# Patient Record
Sex: Male | Born: 1950 | Race: Black or African American | Hispanic: No | Marital: Single | Smoking: Former smoker
Health system: Southern US, Community
[De-identification: ages and names within clinical notes are randomized; demographics above are authoritative.]

## PROBLEM LIST (undated history)

## (undated) DIAGNOSIS — K219 Gastro-esophageal reflux disease without esophagitis: Secondary | ICD-10-CM

## (undated) DIAGNOSIS — D696 Thrombocytopenia, unspecified: Secondary | ICD-10-CM

## (undated) DIAGNOSIS — D649 Anemia, unspecified: Secondary | ICD-10-CM

## (undated) DIAGNOSIS — F1020 Alcohol dependence, uncomplicated: Secondary | ICD-10-CM

## (undated) DIAGNOSIS — R079 Chest pain, unspecified: Secondary | ICD-10-CM

## (undated) DIAGNOSIS — D126 Benign neoplasm of colon, unspecified: Secondary | ICD-10-CM

## (undated) DIAGNOSIS — D891 Cryoglobulinemia: Secondary | ICD-10-CM

## (undated) DIAGNOSIS — I2510 Atherosclerotic heart disease of native coronary artery without angina pectoris: Secondary | ICD-10-CM

## (undated) DIAGNOSIS — R37 Sexual dysfunction, unspecified: Secondary | ICD-10-CM

## (undated) DIAGNOSIS — K7460 Unspecified cirrhosis of liver: Secondary | ICD-10-CM

## (undated) DIAGNOSIS — K2270 Barrett's esophagus without dysplasia: Principal | ICD-10-CM

## (undated) DIAGNOSIS — F1910 Other psychoactive substance abuse, uncomplicated: Secondary | ICD-10-CM

## (undated) DIAGNOSIS — G8929 Other chronic pain: Secondary | ICD-10-CM

## (undated) DIAGNOSIS — E785 Hyperlipidemia, unspecified: Secondary | ICD-10-CM

## (undated) DIAGNOSIS — G621 Alcoholic polyneuropathy: Secondary | ICD-10-CM

## (undated) DIAGNOSIS — I10 Essential (primary) hypertension: Secondary | ICD-10-CM

## (undated) DIAGNOSIS — T7840XA Allergy, unspecified, initial encounter: Secondary | ICD-10-CM

## (undated) DIAGNOSIS — C154 Malignant neoplasm of middle third of esophagus: Secondary | ICD-10-CM

## (undated) DIAGNOSIS — I219 Acute myocardial infarction, unspecified: Secondary | ICD-10-CM

## (undated) DIAGNOSIS — M1990 Unspecified osteoarthritis, unspecified site: Secondary | ICD-10-CM

## (undated) DIAGNOSIS — B182 Chronic viral hepatitis C: Secondary | ICD-10-CM

## (undated) HISTORY — PX: UPPER GASTROINTESTINAL ENDOSCOPY: SHX188

## (undated) HISTORY — DX: Malignant neoplasm of middle third of esophagus: C15.4

## (undated) HISTORY — DX: Atherosclerotic heart disease of native coronary artery without angina pectoris: I25.10

## (undated) HISTORY — PX: CORONARY ANGIOPLASTY: SHX604

## (undated) HISTORY — DX: Other psychoactive substance abuse, uncomplicated: F19.10

## (undated) HISTORY — DX: Chest pain, unspecified: R07.9

## (undated) HISTORY — DX: Unspecified osteoarthritis, unspecified site: M19.90

## (undated) HISTORY — DX: Hyperlipidemia, unspecified: E78.5

## (undated) HISTORY — DX: Chronic viral hepatitis C: B18.2

## (undated) HISTORY — DX: Other chronic pain: G89.29

## (undated) HISTORY — DX: Acute myocardial infarction, unspecified: I21.9

## (undated) HISTORY — DX: Essential (primary) hypertension: I10

## (undated) HISTORY — DX: Barrett's esophagus without dysplasia: K22.70

## (undated) HISTORY — DX: Anemia, unspecified: D64.9

## (undated) HISTORY — DX: Gastro-esophageal reflux disease without esophagitis: K21.9

## (undated) HISTORY — DX: Benign neoplasm of colon, unspecified: D12.6

## (undated) HISTORY — DX: Cryoglobulinemia: D89.1

## (undated) HISTORY — PX: HERNIA REPAIR: SHX51

## (undated) HISTORY — DX: Alcohol dependence, uncomplicated: F10.20

## (undated) HISTORY — DX: Sexual dysfunction, unspecified: R37

## (undated) HISTORY — DX: Unspecified cirrhosis of liver: K74.60

## (undated) HISTORY — DX: Allergy, unspecified, initial encounter: T78.40XA

## (undated) HISTORY — DX: Alcoholic polyneuropathy: G62.1

## (undated) HISTORY — DX: Thrombocytopenia, unspecified: D69.6

---

## 2000-10-20 ENCOUNTER — Emergency Department (HOSPITAL_COMMUNITY): Admission: EM | Admit: 2000-10-20 | Discharge: 2000-10-20 | Payer: Self-pay | Admitting: Emergency Medicine

## 2010-03-04 HISTORY — PX: BALLOON ANGIOPLASTY, ARTERY: SHX564

## 2010-11-16 ENCOUNTER — Emergency Department (HOSPITAL_COMMUNITY): Payer: Medicaid Other

## 2010-11-16 ENCOUNTER — Emergency Department (HOSPITAL_COMMUNITY)
Admission: EM | Admit: 2010-11-16 | Discharge: 2010-11-16 | Disposition: A | Discharge status: Home / Self Care | Payer: Medicaid Other | Attending: Emergency Medicine | Admitting: Emergency Medicine

## 2010-11-16 DIAGNOSIS — R51 Headache: Secondary | ICD-10-CM | POA: Insufficient documentation

## 2010-11-16 DIAGNOSIS — R413 Other amnesia: Secondary | ICD-10-CM | POA: Insufficient documentation

## 2010-11-16 DIAGNOSIS — I252 Old myocardial infarction: Secondary | ICD-10-CM | POA: Insufficient documentation

## 2010-11-16 DIAGNOSIS — F1010 Alcohol abuse, uncomplicated: Secondary | ICD-10-CM | POA: Insufficient documentation

## 2010-11-16 DIAGNOSIS — Z79899 Other long term (current) drug therapy: Secondary | ICD-10-CM | POA: Insufficient documentation

## 2010-11-16 DIAGNOSIS — Y929 Unspecified place or not applicable: Secondary | ICD-10-CM | POA: Insufficient documentation

## 2010-11-16 DIAGNOSIS — I10 Essential (primary) hypertension: Secondary | ICD-10-CM | POA: Insufficient documentation

## 2010-11-16 DIAGNOSIS — M542 Cervicalgia: Secondary | ICD-10-CM | POA: Insufficient documentation

## 2010-11-16 DIAGNOSIS — I2510 Atherosclerotic heart disease of native coronary artery without angina pectoris: Secondary | ICD-10-CM | POA: Insufficient documentation

## 2010-12-03 ENCOUNTER — Encounter: Payer: Self-pay | Admitting: Cardiology

## 2010-12-03 ENCOUNTER — Ambulatory Visit (INDEPENDENT_AMBULATORY_CARE_PROVIDER_SITE_OTHER): Payer: Self-pay | Admitting: Cardiology

## 2010-12-03 DIAGNOSIS — E785 Hyperlipidemia, unspecified: Secondary | ICD-10-CM

## 2010-12-03 DIAGNOSIS — I2510 Atherosclerotic heart disease of native coronary artery without angina pectoris: Secondary | ICD-10-CM | POA: Insufficient documentation

## 2010-12-03 DIAGNOSIS — Z720 Tobacco use: Secondary | ICD-10-CM | POA: Insufficient documentation

## 2010-12-03 DIAGNOSIS — R079 Chest pain, unspecified: Secondary | ICD-10-CM

## 2010-12-03 DIAGNOSIS — I10 Essential (primary) hypertension: Secondary | ICD-10-CM

## 2010-12-03 DIAGNOSIS — F1410 Cocaine abuse, uncomplicated: Secondary | ICD-10-CM | POA: Insufficient documentation

## 2010-12-03 DIAGNOSIS — F17200 Nicotine dependence, unspecified, uncomplicated: Secondary | ICD-10-CM

## 2010-12-03 NOTE — Progress Notes (Signed)
HPI: 60 year old male with past medical history of coronary disease for evaluation of chest pain. Previously resided in [place]. Patient states he had myocardial infarction in January of 2011, August 2011 in November 2011. The first 2 occurred in the setting of cocaine use. He apparently had PCI following the second event. He recently moved here to be with his mother. He does describe chest pain intermittently since his initial event. Pain is diffuse across his chest and described as a heaviness. He can increase with inspiration and certain movements. It is not exertional. It can be shortness of breath but no nausea or diaphoresis. It does not radiate. He occasionally takes a nitroglycerin for this pain. There is no significant dyspnea on exertion, orthopnea, PND, pedal edema or syncope.  Current Outpatient Prescriptions  Medication Sig Dispense Refill  . amLODipine (NORVASC) 10 MG tablet Take 10 mg by mouth daily.        Marland Kitchen aspirin 325 MG tablet Take 325 mg by mouth daily.        . carvedilol (COREG) 12.5 MG tablet Take 12.5 mg by mouth 2 (two) times daily with a meal.        . clopidogrel (PLAVIX) 75 MG tablet Take 75 mg by mouth daily.        . folic acid (FOLVITE) 1 MG tablet Take 1 mg by mouth daily.        Marland Kitchen lisinopril (PRINIVIL,ZESTRIL) 40 MG tablet Take 40 mg by mouth daily.        . nitroGLYCERIN (NITROSTAT) 0.4 MG SL tablet Place 0.4 mg under the tongue every 5 (five) minutes as needed. Up to 3 doses. If pain persist then call 911       . rosuvastatin (CRESTOR) 20 MG tablet Take 20 mg by mouth daily.          No Known Allergies  Past Medical History  Diagnosis Date  . MI (myocardial infarction)     x3, jan, aug, nov 2011; treated at [facility] in [place]  . Hepatitis C   . Sexual dysfunction   . Arthritis   . HTN (hypertension)   . Hyperlipidemia     Past Surgical History  Procedure Date  . Hernia repair     very young    History   Social History  . Marital Status: Single   Spouse Name: N/A    Number of Children: 3  . Years of Education: N/A   Occupational History  .      Unemployed   Social History Main Topics  . Smoking status: Current Everyday Smoker -- 0.3 packs/day for 45 years    Types: Cigarettes  . Smokeless tobacco: Not on file  . Alcohol Use: Yes     Occasional  . Drug Use: No     nothing since 2011; previous heroin, cocaine and Marijuana  . Sexually Active: Not on file   Other Topics Concern  . Not on file   Social History Narrative  . No narrative on file    Family History  Problem Relation Age of Onset  . Cancer      fathers side, 2 uncles  . Cancer      mother's side  . Heart attack Maternal Grandfather   . Diabetes Sister   . Stroke Sister     ROS: no fevers or chills, productive cough, hemoptysis, dysphasia, odynophagia, melena, hematochezia, dysuria, hematuria, rash, seizure activity, orthopnea, PND, pedal edema, claudication. Remaining systems are negative.  Physical Exam: General:  Well developed/well nourished in NAD Skin warm/dry Patient not depressed No peripheral clubbing Back-normal HEENT-normal/normal eyelids Neck supple/normal carotid upstroke bilaterally; no bruits; no JVD; no thyromegaly chest - CTA/ normal expansion CV - RRR/normal S1 and S2; no rubs or gallops;  PMI nondisplaced; 1/6 systolic ejection murmur. Abdomen -NT/ND, no HSM, no mass, + bowel sounds, no bruit 2+ femoral pulses, no bruits Ext-no edema, chords, 2+ DP Neuro-grossly nonfocal  ECG Normal sinus rhythm at a rate of 68. No ST changes.

## 2010-12-03 NOTE — Assessment & Plan Note (Signed)
Patient counseled on discontinuing. 

## 2010-12-03 NOTE — Assessment & Plan Note (Signed)
Continue statin. Lipids and liver monitored by primary care. 

## 2010-12-03 NOTE — Assessment & Plan Note (Signed)
Continue aspirin and statin. 

## 2010-12-03 NOTE — Patient Instructions (Signed)
Your physician recommends that you schedule a follow-up appointment in:  6 months with Dr. Jens Som. The office will mail you a reminder letter 2 months prior appointment date. Your physician has requested that you have en exercise stress myoview. For further information please visit [email or web address] Please follow instruction sheet, as given.

## 2010-12-03 NOTE — Assessment & Plan Note (Signed)
Blood pressure control. Continue present medications. Potassium and renal function monitored by primary care. 

## 2010-12-03 NOTE — Assessment & Plan Note (Addendum)
Symptoms atypical. Schedule Myoview. 

## 2010-12-03 NOTE — Assessment & Plan Note (Signed)
Patient instructed to avoid.

## 2010-12-17 ENCOUNTER — Ambulatory Visit (HOSPITAL_COMMUNITY): Payer: Medicaid Other | Attending: Cardiology | Admitting: Radiology

## 2010-12-17 DIAGNOSIS — R0789 Other chest pain: Secondary | ICD-10-CM

## 2010-12-17 DIAGNOSIS — I2510 Atherosclerotic heart disease of native coronary artery without angina pectoris: Secondary | ICD-10-CM | POA: Insufficient documentation

## 2010-12-17 DIAGNOSIS — I10 Essential (primary) hypertension: Secondary | ICD-10-CM | POA: Insufficient documentation

## 2010-12-17 DIAGNOSIS — R0609 Other forms of dyspnea: Secondary | ICD-10-CM

## 2010-12-17 MED ORDER — TECHNETIUM TC 99M TETROFOSMIN IV KIT
10.7000 | PACK | Freq: Once | INTRAVENOUS | Status: AC | PRN
  Administered 2010-12-17: 11 via INTRAVENOUS

## 2010-12-17 MED ORDER — TECHNETIUM TC 99M TETROFOSMIN IV KIT
33.0000 | PACK | Freq: Once | INTRAVENOUS | Status: AC | PRN
  Administered 2010-12-17: 33 via INTRAVENOUS

## 2010-12-17 NOTE — Progress Notes (Signed)
[facility] SITE 3 NUCLEAR MED [address] [place] [address] [phone]  Cardiology Nuclear Med Study  Patrick [name] is a 60 y.o. male [id number] [birth year]   Nuclear Med Background Indication for Stress Test:  Evaluation for Ischemia and PTCA Patency History:  h/o MI x 3 due to cocaine use, last MI 11/11, 8/11 MI>PTCA, '11 MPS:OK per patient Cardiac Risk Factors: Family History - CAD, Hypertension, Lipids and Smoker  Symptoms:  Chest Pain/Pressure/Tightness (last episode of chest discomfort is now, 4/10), Diaphoresis, DOE, Fatigue, Rapid HR and SOB.  Last cocaine use per patient was 12/11.   Nuclear Pre-Procedure Caffeine/Decaff Intake:  None NPO After: 9:30pm   Lungs:   IV 0.9% NS with Angio Cath:  20g  IV Site: R Antecubital  IV Started by:  Irean Hong, RN  Chest Size (in):  40 Cup Size: n/a  Height: 5\' 10"  (1.778 m)  Weight:  182 lb (82.555 kg)  BMI:  Body mass index is 26.11 kg/(m^2). Tech Comments:  Patient ran out of carvedilol > 3 weeks ago.  Patient c/o chest tightness, 4/10,  prior to stress with nonspecific ST changes.  This was discussed with Dr. Jens Som and was OK to continue with stress test.     Nuclear Med Study 1 or 2 day study: 1 day  Stress Test Type:  Stress  Reading MD: Arvilla Meres, MD  Order Authorizing Provider:  Olga Millers, MD  Resting Radionuclide: Technetium 33m Tetrofosmin  Resting Radionuclide Dose: 10.7 mCi   Stress Radionuclide:  Technetium 51m Tetrofosmin  Stress Radionuclide Dose: 33 mCi           Stress Protocol Rest HR: 68 Stress HR: 157  Rest BP: 112/91 Stress BP: 149/58  Exercise Time (min): 5:30 METS: 7.0   Predicted Max HR: 160 bpm % Max HR: 98.12 bpm Rate Pressure Product: 01027   Dose of Adenosine (mg):  n/a Dose of Lexiscan: n/a mg  Dose of Atropine (mg): n/a Dose of Dobutamine: n/a mcg/kg/min (at max HR)  Stress Test Technologist: Smiley Houseman, CMA-N  Nuclear Technologist:  Domenic Polite, CNMT     Rest Procedure:  Myocardial perfusion imaging was performed at rest 45 minutes following the intravenous administration of Technetium 39m Tetrofosmin.  Rest ECG: Early repolarization.  Stress Procedure:  The patient started with chest tightness 4/10, prior to exercise, but that decreased to 2/10 just before stepping on the treadmill with no change with exercise. The patient exercised for 5:30 on the treadmill utilizing the Bruce protocol.  The patient stopped due to fatigue.  There were no diagnostic ST-T wave changes, only occasional PVC's.  Technetium 50m Tetrofosmin was injected at peak exercise and myocardial perfusion imaging was performed after a brief delay.  Stress ECG: No significant change from baseline ECG  QPS Raw Data Images:  Normal; no motion artifact; normal heart/lung ratio. Stress Images:  There is decreased uptake in the inferior wall. Rest Images:  Normal homogeneous uptake in all areas of the myocardium. Subtraction (SDS):  These findings are consistent with mild inferior ischemia. Transient Ischemic Dilatation (Normal <1.22):  1.02 Lung/Heart Ratio (Normal <0.45):  0.31  Quantitative Gated Spect Images QGS EDV:  107 ml QGS ESV:  59 ml QGS cine images:  Inferolateral hypokinesis.  QGS EF: 45%  Impression Exercise Capacity:  Fair exercise capacity. BP Response:  Normal blood pressure response. Clinical Symptoms:  Mild chest tightness prior to exercise which persisted without change,. ECG Impression:  No significant ST  segment change suggestive of ischemia. Comparison with Prior Nuclear Study: No images to compare  Overall Impression:  Abnormal stress nuclear study. EF 45%. Very mild reversible inferior defect suggestive of ischemia.      Daniel Bensimhon

## 2010-12-18 NOTE — Progress Notes (Signed)
Routed to Dr. Jens Som.Mirna Mires

## 2010-12-19 NOTE — Progress Notes (Signed)
pt aware of results, follow up ap[pt made Patrick [name]  

## 2010-12-24 ENCOUNTER — Encounter: Payer: Self-pay | Admitting: Cardiology

## 2010-12-25 ENCOUNTER — Ambulatory Visit (INDEPENDENT_AMBULATORY_CARE_PROVIDER_SITE_OTHER): Payer: Self-pay | Admitting: Cardiology

## 2010-12-25 ENCOUNTER — Encounter: Payer: Self-pay | Admitting: Cardiology

## 2010-12-25 DIAGNOSIS — E785 Hyperlipidemia, unspecified: Secondary | ICD-10-CM

## 2010-12-25 DIAGNOSIS — R079 Chest pain, unspecified: Secondary | ICD-10-CM

## 2010-12-25 DIAGNOSIS — F17200 Nicotine dependence, unspecified, uncomplicated: Secondary | ICD-10-CM

## 2010-12-25 DIAGNOSIS — I10 Essential (primary) hypertension: Secondary | ICD-10-CM

## 2010-12-25 DIAGNOSIS — Z720 Tobacco use: Secondary | ICD-10-CM

## 2010-12-25 DIAGNOSIS — I2510 Atherosclerotic heart disease of native coronary artery without angina pectoris: Secondary | ICD-10-CM

## 2010-12-25 NOTE — Assessment & Plan Note (Signed)
Difficult situation. Patient appears to be having chronic chest pain. However he also has documented coronary disease and a mildly abnormal Myoview. I think we need to proceed with definitive evaluation. I recommended cardiac catheterization. The risks and benefits were discussed and the patient agrees to proceed.

## 2010-12-25 NOTE — Assessment & Plan Note (Signed)
Continue statin. 

## 2010-12-25 NOTE — Assessment & Plan Note (Signed)
Continue aspirin, Plavix and statin. 

## 2010-12-25 NOTE — Patient Instructions (Signed)
Please call [phone] when you are ready to schedule your heart catheterization.

## 2010-12-25 NOTE — Assessment & Plan Note (Signed)
Blood pressure controlled. 

## 2010-12-25 NOTE — Progress Notes (Signed)
HPI:60 year old male with past medical history of coronary disease I initially saw 12/03/10 for evaluation of chest pain. Previously resided in [place]. Patient states he had myocardial infarction in January of 2011, August 2011 in November 2011. The first 2 occurred in the setting of cocaine use. He apparently had PCI following the second event. Review his records showed that his catheterization revealed a 70% mid LAD and a 99% stenosis in the posterior left ventricular branch of the right coronary artery. He had angioplasty of that lesion in August of 2011.We scheduled a myoview when I saw him previously and was performed 5/12 - mild inferior ischemia and EF 45. Since then, he continues to have chest pain. This occurs on a daily basis. It is nonexertional. It can last all day at times. There are no associated symptoms.   Current Outpatient Prescriptions  Medication Sig Dispense Refill  . amLODipine (NORVASC) 10 MG tablet Take 10 mg by mouth daily.        Marland Kitchen aspirin 325 MG tablet Take 325 mg by mouth daily.        . clopidogrel (PLAVIX) 75 MG tablet Take 75 mg by mouth daily.        . folic acid (FOLVITE) 1 MG tablet Take 1 mg by mouth daily.        Marland Kitchen lisinopril (PRINIVIL,ZESTRIL) 40 MG tablet Take 40 mg by mouth daily.        . nitroGLYCERIN (NITROSTAT) 0.4 MG SL tablet Place 0.4 mg under the tongue every 5 (five) minutes as needed. Up to 3 doses. If pain persist then call 911       . rosuvastatin (CRESTOR) 20 MG tablet Take 20 mg by mouth daily.        . carvedilol (COREG) 12.5 MG tablet Take 12.5 mg by mouth 2 (two) times daily with a meal.           Past Medical History  Diagnosis Date  . MI (myocardial infarction)     x3, jan, aug, nov 2011; treated at [facility] in [place]  . Hepatitis C   . Sexual dysfunction   . Arthritis   . HTN (hypertension)   . Hyperlipidemia     Past Surgical History  Procedure Date  . Hernia repair     very young    History   Social History  . Marital  Status: Single    Spouse Name: N/A    Number of Children: 3  . Years of Education: N/A   Occupational History  .      Unemployed   Social History Main Topics  . Smoking status: Current Everyday Smoker -- 0.3 packs/day for 45 years    Types: Cigarettes  . Smokeless tobacco: Not on file  . Alcohol Use: Yes     Occasional  . Drug Use: No     nothing since 2011; previous heroin, cocaine and Marijuana  . Sexually Active: Not on file   Other Topics Concern  . Not on file   Social History Narrative  . No narrative on file    ROS: no fevers or chills, productive cough, hemoptysis, dysphasia, odynophagia, melena, hematochezia, dysuria, hematuria, rash, seizure activity, orthopnea, PND, pedal edema, claudication. Remaining systems are negative.  Physical Exam: Well-developed well-nourished in no acute distress.  Skin is warm and dry.  HEENT is normal.  Neck is supple. No thyromegaly.  Chest is clear to auscultation with normal expansion.  Cardiovascular exam is regular rate and rhythm.  Abdominal exam  nontender or distended. No masses palpated. Extremities show no edema. neuro grossly intact

## 2010-12-25 NOTE — Assessment & Plan Note (Signed)
Patient counseled on discontinuing. 

## 2011-01-21 ENCOUNTER — Telehealth: Payer: Self-pay | Admitting: *Deleted

## 2011-01-21 NOTE — Telephone Encounter (Signed)
Received fax from [facility] requesting pt lisinopril be changed because it is not available through them. Pt changed to accupril 40mg  once daily. Refills also given on norvasc,crestor and ntg. Patrick [name]

## 2011-01-29 ENCOUNTER — Inpatient Hospital Stay (HOSPITAL_COMMUNITY)
Admission: EM | Admit: 2011-01-29 | Discharge: 2011-02-01 | DRG: 287 | Disposition: A | Discharge status: Home / Self Care | Payer: Medicaid Other | Attending: Internal Medicine | Admitting: Internal Medicine

## 2011-01-29 ENCOUNTER — Emergency Department (HOSPITAL_COMMUNITY): Payer: Medicaid Other

## 2011-01-29 DIAGNOSIS — Z7982 Long term (current) use of aspirin: Secondary | ICD-10-CM

## 2011-01-29 DIAGNOSIS — R079 Chest pain, unspecified: Secondary | ICD-10-CM

## 2011-01-29 DIAGNOSIS — M1990 Unspecified osteoarthritis, unspecified site: Secondary | ICD-10-CM | POA: Diagnosis present

## 2011-01-29 DIAGNOSIS — I252 Old myocardial infarction: Secondary | ICD-10-CM

## 2011-01-29 DIAGNOSIS — D696 Thrombocytopenia, unspecified: Secondary | ICD-10-CM | POA: Diagnosis present

## 2011-01-29 DIAGNOSIS — F17200 Nicotine dependence, unspecified, uncomplicated: Secondary | ICD-10-CM | POA: Diagnosis present

## 2011-01-29 DIAGNOSIS — I10 Essential (primary) hypertension: Secondary | ICD-10-CM | POA: Diagnosis present

## 2011-01-29 DIAGNOSIS — K7460 Unspecified cirrhosis of liver: Secondary | ICD-10-CM | POA: Diagnosis present

## 2011-01-29 DIAGNOSIS — D72819 Decreased white blood cell count, unspecified: Secondary | ICD-10-CM | POA: Diagnosis present

## 2011-01-29 DIAGNOSIS — Z7902 Long term (current) use of antithrombotics/antiplatelets: Secondary | ICD-10-CM

## 2011-01-29 DIAGNOSIS — E7800 Pure hypercholesterolemia, unspecified: Secondary | ICD-10-CM | POA: Diagnosis present

## 2011-01-29 DIAGNOSIS — I2510 Atherosclerotic heart disease of native coronary artery without angina pectoris: Principal | ICD-10-CM | POA: Diagnosis present

## 2011-01-29 DIAGNOSIS — R0602 Shortness of breath: Secondary | ICD-10-CM | POA: Diagnosis present

## 2011-01-29 DIAGNOSIS — B1920 Unspecified viral hepatitis C without hepatic coma: Secondary | ICD-10-CM | POA: Diagnosis present

## 2011-01-29 LAB — HEPATIC FUNCTION PANEL
ALT: 180 U/L — ABNORMAL HIGH (ref 0–53)
Alkaline Phosphatase: 72 U/L (ref 39–117)
Bilirubin, Direct: 0.2 mg/dL (ref 0.0–0.3)
Indirect Bilirubin: 0.6 mg/dL (ref 0.3–0.9)
Total Protein: 7.8 g/dL (ref 6.0–8.3)

## 2011-01-29 LAB — DIFFERENTIAL
Eosinophils Relative: 4 % (ref 0–5)
Lymphocytes Relative: 35 % (ref 12–46)
Lymphs Abs: 1 10*3/uL (ref 0.7–4.0)

## 2011-01-29 LAB — URINALYSIS, MICROSCOPIC ONLY
Glucose, UA: NEGATIVE mg/dL
Ketones, ur: 40 mg/dL — AB
Leukocytes, UA: NEGATIVE
Nitrite: NEGATIVE
Specific Gravity, Urine: 1.024 (ref 1.005–1.030)
pH: 6 (ref 5.0–8.0)

## 2011-01-29 LAB — RAPID URINE DRUG SCREEN, HOSP PERFORMED
Amphetamines: NOT DETECTED
Opiates: POSITIVE — AB

## 2011-01-29 LAB — POCT I-STAT, CHEM 8
Chloride: 104 mEq/L (ref 96–112)
Glucose, Bld: 82 mg/dL (ref 70–99)
HCT: 41 % (ref 39.0–52.0)
Potassium: 4.5 mEq/L (ref 3.5–5.1)

## 2011-01-29 LAB — CBC
HCT: 37.6 % — ABNORMAL LOW (ref 39.0–52.0)
Hemoglobin: 13.6 g/dL (ref 13.0–17.0)
MCV: 85.8 fL (ref 78.0–100.0)
RBC: 4.38 MIL/uL (ref 4.22–5.81)
RDW: 14.7 % (ref 11.5–15.5)
WBC: 2.9 10*3/uL — ABNORMAL LOW (ref 4.0–10.5)

## 2011-01-29 LAB — APTT: aPTT: 31 seconds (ref 24–37)

## 2011-01-29 LAB — PLATELET INHIBITION P2Y12: Platelet Function  P2Y12: 199 [PRU] (ref 194–418)

## 2011-01-29 LAB — LIPID PANEL
HDL: 52 mg/dL (ref >39)
LDL Cholesterol: 93 mg/dL (ref 0–99)
Total CHOL/HDL Ratio: 3.3 RATIO
Triglycerides: 119 mg/dL (ref <150)

## 2011-01-29 LAB — CARDIAC PANEL(CRET KIN+CKTOT+MB+TROPI)
Relative Index: 0.7 (ref 0.0–2.5)
Total CK: 400 U/L — ABNORMAL HIGH (ref 7–232)

## 2011-01-29 LAB — PROTIME-INR: INR: 1.1 (ref 0.00–1.49)

## 2011-01-29 LAB — CK TOTAL AND CKMB (NOT AT ARMC): Relative Index: 0.6 (ref 0.0–2.5)

## 2011-01-29 LAB — TROPONIN I: Troponin I: <0.3 ng/mL (ref <0.30)

## 2011-01-29 LAB — TSH: TSH: 1.016 u[IU]/mL (ref 0.350–4.500)

## 2011-01-30 ENCOUNTER — Inpatient Hospital Stay (HOSPITAL_COMMUNITY): Payer: Medicaid Other

## 2011-01-30 DIAGNOSIS — D61818 Other pancytopenia: Secondary | ICD-10-CM

## 2011-01-30 DIAGNOSIS — I517 Cardiomegaly: Secondary | ICD-10-CM

## 2011-01-30 LAB — COMPREHENSIVE METABOLIC PANEL
ALT: 213 U/L — ABNORMAL HIGH (ref 0–53)
Albumin: 3.4 g/dL — ABNORMAL LOW (ref 3.5–5.2)
Alkaline Phosphatase: 72 U/L (ref 39–117)
Chloride: 103 mEq/L (ref 96–112)
Potassium: 4 mEq/L (ref 3.5–5.1)
Sodium: 135 mEq/L (ref 135–145)
Total Bilirubin: 0.4 mg/dL (ref 0.3–1.2)
Total Protein: 7.3 g/dL (ref 6.0–8.3)

## 2011-01-30 LAB — BASIC METABOLIC PANEL
BUN: 13 mg/dL (ref 6–23)
Chloride: 100 mEq/L (ref 96–112)
Creatinine, Ser: 1.01 mg/dL (ref 0.50–1.35)
GFR calc non Af Amer: >60 mL/min (ref >60)
Glucose, Bld: 107 mg/dL — ABNORMAL HIGH (ref 70–99)
Potassium: 3.6 mEq/L (ref 3.5–5.1)

## 2011-01-30 LAB — CBC
HCT: 34.3 % — ABNORMAL LOW (ref 39.0–52.0)
MCHC: 36.2 g/dL — ABNORMAL HIGH (ref 30.0–36.0)
Platelets: 60 10*3/uL — ABNORMAL LOW (ref 150–400)
RDW: 14.6 % (ref 11.5–15.5)
WBC: 2.7 10*3/uL — ABNORMAL LOW (ref 4.0–10.5)

## 2011-01-30 LAB — CARDIAC PANEL(CRET KIN+CKTOT+MB+TROPI)
CK, MB: 2.7 ng/mL (ref 0.3–4.0)
CK, MB: 2.7 ng/mL (ref 0.3–4.0)
Troponin I: <0.3 ng/mL (ref <0.30)
Troponin I: <0.3 ng/mL (ref <0.30)

## 2011-01-31 DIAGNOSIS — I2510 Atherosclerotic heart disease of native coronary artery without angina pectoris: Secondary | ICD-10-CM

## 2011-01-31 HISTORY — PX: CARDIAC CATHETERIZATION: SHX172

## 2011-01-31 LAB — CBC
HCT: 33.9 % — ABNORMAL LOW (ref 39.0–52.0)
Hemoglobin: 12.3 g/dL — ABNORMAL LOW (ref 13.0–17.0)
RBC: 3.95 MIL/uL — ABNORMAL LOW (ref 4.22–5.81)
WBC: 2.7 10*3/uL — ABNORMAL LOW (ref 4.0–10.5)

## 2011-01-31 LAB — COMPREHENSIVE METABOLIC PANEL
ALT: 205 U/L — ABNORMAL HIGH (ref 0–53)
AST: 170 U/L — ABNORMAL HIGH (ref 0–37)
Albumin: 3.2 g/dL — ABNORMAL LOW (ref 3.5–5.2)
Alkaline Phosphatase: 70 U/L (ref 39–117)
Chloride: 103 mEq/L (ref 96–112)
Potassium: 3.8 mEq/L (ref 3.5–5.1)
Sodium: 135 mEq/L (ref 135–145)
Total Protein: 7 g/dL (ref 6.0–8.3)

## 2011-01-31 LAB — HIV ANTIBODY (ROUTINE TESTING W REFLEX): HIV: NONREACTIVE

## 2011-01-31 MED ORDER — IOHEXOL 300 MG/ML  SOLN
100.0000 mL | Freq: Once | INTRAMUSCULAR | Status: AC | PRN
  Administered 2011-01-31: 100 mL via INTRAVENOUS

## 2011-02-01 LAB — CBC
HCT: 34 % — ABNORMAL LOW (ref 39.0–52.0)
Hemoglobin: 12.1 g/dL — ABNORMAL LOW (ref 13.0–17.0)
MCH: 30.8 pg (ref 26.0–34.0)
MCHC: 35.6 g/dL (ref 30.0–36.0)
MCV: 86.5 fL (ref 78.0–100.0)

## 2011-02-01 LAB — BASIC METABOLIC PANEL
BUN: 8 mg/dL (ref 6–23)
Calcium: 9 mg/dL (ref 8.4–10.5)
Creatinine, Ser: 0.93 mg/dL (ref 0.50–1.35)
GFR calc non Af Amer: >60 mL/min (ref >60)
Glucose, Bld: 90 mg/dL (ref 70–99)
Potassium: 3.8 mEq/L (ref 3.5–5.1)

## 2011-02-17 ENCOUNTER — Encounter: Payer: Self-pay | Admitting: Cardiology

## 2011-02-20 NOTE — Discharge Summary (Signed)
[name], Patrick [name] NO.:  [id number]  MEDICAL RECORD NO.:  [id number]  LOCATION:  6531                         FACILITY:  [facility]  PHYSICIAN:  Duke Salvia, MD, FACCDATE OF BIRTH:  [birth year]  DATE OF ADMISSION:  01/29/2011 DATE OF DISCHARGE:  02/01/2011                              DISCHARGE SUMMARY   PRIMARY CARDIOLOGIST:  Madolyn Frieze. Jens Som, MD, [facility]  PRIMARY CARE PROVIDER:  [facility].  HEMATOLOGIST:  Drue Second, MD  DISCHARGE DIAGNOSES: 1. Chest pain with moderate diffuse coronary artery disease per     cardiac catheterization this admission.     a.     Normal left ventricular function. 2. Thrombocytopenia. 3. Hepatitis C with elevated LFTs. 4. Substance abuse. 5. Hypertension. 6. Hypercholesterolemia.  SECONDARY DIAGNOSIS:  Arthritis.  PROCEDURE/DIAGNOSTICS:  Performed during hospitalization: 1. Left heart catheterization with selective coronary angiography and    left ventricular angiogram.     a.     Moderate diffuse coronary artery disease with ectasia of the      right coronary artery, diffuse stenosis of the left anterior      descending and left circumflex.     b.     Normal left ventricular function. 2. CT of abdomen and pelvis with contrast on January 31, 2011:  No focal     parenchymal abnormality was demonstrated in the liver or spleen.     No significant abdominal, pelvic, or retroperitoneal     lymphadenopathy.  Atrophy and nonfunctioning right kidney. 3. Chest x-ray on January 29, 2011:  No acute findings.  REASON FOR HOSPITALIZATION:  This is a 60 year old African American gentleman with known coronary artery disease who underwent PTCA in August 2011 at [facility] in [place] as well as myocardial infarction in January 2011 and November 2011 with two occurrences in the setting of cocaine use.  He has recently moved from [place] and was evaluated by Dr. Jens Som in May.  A Myoview was scheduled showing mild inferior ischemia  and EF of 45%.  The patient continued to have chest pain.  On the day of admission, he had usual angina with substernal chest pressure and tightness that radiated to his left arm and right neck.  He presented to the [facility] Emergency Department without trying medication. Initial EKG showed sinus rhythm with no acute ischemic changes. Troponin was within normal limits.  The patient was admitted to telemetry and placed on IV heparin for left heart catheterization and possible PCI.  HOSPITAL COURSE:  The patient was admitted to the telemetry unit.  He ruled out for myocardial infarction, but he still had mild symptoms of chest pain.  In reviewing records, it appears the patient's platelet count was 60,000 with WBC of 2.7.  With the patient's thrombocytopenia of questionable etiology, his Plavix was discontinued as well as his heparin and a Hematology consult was obtained.  This was also in the setting of LFTs with an SGOT of 177 and SGPT of 180, this was felt to be secondary to his hepatitis C and his statin was held.  Dr. Welton Flakes with Hematology evaluated the patient.  The patient does have a history of hepatitis C and I felt  that this is most likely underlying cirrhosis due to his hepatitis C and continued alcohol use.  The patient has never been treated for his hepatitis C.  At that time, Dr. Welton Flakes completed a hepatitis panel which was reactive for hepatitis C antibody.  HIV panel was nonreactive.  A CT of the abdomen also showed no acute changes. With the patient's low platelet count and need for cardiac catheterization, it was felt that he could be transfused if needed.  After further discussion with Dr. Jens Som as well as the interventionalist, Dr. Excell Seltzer, it was felt that we would proceed with cardiac catheterization to evaluate coronary anatomy.  Dr. Excell Seltzer felt that the a radial cath would be best with a high threshold for PCI as the patient would be at increased risk for bleeding with  dual- antiplatelet therapy.  Risks, benefits, and indications were discussed with the patient, he agreed to proceed.  On January 31, 2011, the patient was brought to the cath lab by Dr. Excell Seltzer, informed consent was obtained.  As above, there were no critical lesions but a moderate diffuse disease in the LAD, RCA, and left circumflex. Please see full report for details.  At this time, Dr. Excell Seltzer continued to recommend no Plavix but aspirin 81 mg alone in the setting of the patient's thrombocytopenia.  He will also be continued on further medical management.  Dr. Jens Som noted that the patient will need followup with gastroenterologist as an outpatient for further treatment of his hepatitis C.  He also discussed the importance of discontinuing substance abuse.  Again, no statin was used secondary to the patient's LFTs.  On day of discharge, Dr. Graciela Husbands evaluated the patient and noted him stable for home.  He will be continued on medical management for his coronary artery disease with aspirin, beta-blocker, and lisinopril.  His Plavix and Crestor have been discontinued as above.  The patient will have further outpatient work up with gastroenterologist for his hepatitis C.  The patient's left groin site was okay without evidence of hematoma. The patient was also ambulating without further chest pain.  DISCHARGE LABORATORY DATA:  Sodium 136, potassium 3.8, BUN 8, creatinine 0.93.  WBC of 30, hemoglobin 12.1, hematocrit 34, platelets 58.  DISCHARGE MEDICATIONS: 1. Aspirin 81 mg 1 tablet daily. 2. Amlodipine 10 mg 1 tablet daily. 3. Carvedilol 12.5 mg 1 tablet twice daily. 4. Folic acid 1 mg daily. 5. Lisinopril 20 mg daily. 6. Nitroglycerin sublingual 0.4 mg 1 tablet under tongue every 5     minutes as needed up to three doses for chest pain. Please stop taking Plavix and Crestor.  FOLLOWUP PLANS AND INSTRUCTIONS: 1. The patient will follow up with Dr. Jens Som in approximately 2      weeks, the office will call to schedule this appointment. 2. The patient is to follow up with [facility] for     further workup of his hepatitis C, and office will call to schedule     this appointment. 3. The patient should increase activity slowly.  He may shower but no     bathing.  He is not to lift anything for 1 week greater than 5     pounds.  No sexual activity for 1 week.  No driving for 1 day.     Keep his cath site clean and dry, and call our office for any     problems. 4. He is to continue low-sodium heart-healthy diet. 5. The patient is to avoid straining and stop  any activities that     cause chest pain or shortness breath. 6. The patient is to call the office in the interim for any problems     or concerns.  DURATION OF DISCHARGE:  Greater than 30 minutes including physician and physician extender time.     Leonette Monarch, PA-C   ______________________________ Duke Salvia, MD, [facility]    NB/MEDQ  D:  02/01/2011  T:  02/01/2011  Job:  [id number]  cc:   Drue Second, M.D. Madolyn Frieze Jens Som, MD, [facility]  Electronically Signed by Alen Blew P.A. on 02/06/2011 07:17:17 PM Electronically Signed by Sherryl Manges MD [facility] on 02/20/2011 02:14:38 PM

## 2011-02-20 NOTE — Cardiovascular Report (Signed)
  NAMESADIEL, MOTA               ACCOUNT NO.:  [id number]  MEDICAL RECORD NO.:  [id number]  LOCATION:  6531                         FACILITY:  [facility]  PHYSICIAN:  Veverly Fells. Excell Seltzer, MD  DATE OF BIRTH:  02-21-1951  DATE OF PROCEDURE:  01/31/2011 DATE OF DISCHARGE:                           CARDIAC CATHETERIZATION   PROCEDURES: 1. Left heart catheterization. 2. Selective coronary angiography. 3. Left jugular angiography.  PROCEDURAL INDICATION:  Mr. Colvin is a 60 year old gentleman with coronary artery disease.  He has undergone prior PCI procedures elsewhere.  He presented with chest pain and has some degree of chronic pain.  His symptoms were both typical and atypical, and he was referred for cardiac cath for definitive evaluation.  The patient has thrombocytopenia with platelet count of about 60,000.  We elected to go from a left radial approach.  Risks and indication of procedure were reviewed with the patient, informed consent was obtained.  Left wrist was prepped and draped, anesthetized with 1% lidocaine using modified Seldinger technique.  A 5- French sheath was placed in the left radial artery, 3 mg of verapamil was administered through the sheath, 4000 units of heparin was given intravenously.  Radial and brachial artery angiograms were performed to help with navigating through tortuosity and branch vessels.  We are able to access the central aorta and further catheter exchanges were performed over an exchange-length wire.  The patient tolerated the procedure well.  There were no immediate complications.  FINDINGS:  Aortic pressure 134/86 with a mean of 108, left ventricular pressure 137/10.  Left ventriculography shows normal LV function.  The ejection fraction is estimated at 60-65%.  Left mainstem:  The left main is patent.  There is no obstructive disease.  It divides into the LAD and left circumflex.  LAD:  The LAD has diffuse stenoses.  There is a proximal  30-40% stenosis, mid 60-70% stenosis, and distal 50% stenosis.  There are 3 diagonal branches, all are patent.  There was diffuse irregularity throughout the entire distribution of the LAD.  Left circumflex:  The left circumflex is patent.  There is a 40-50% mid stenosis and a 50% stenosis into the first OM branch.  The OM divides into 2 large sub-branches, both are diffusely diseased without evidence of high-grade stenosis.  RCA:  The right coronary artery is a large, dominant vessel.  There is no high-grade obstructive disease.  There is diffuse ectasia throughout the mid and distal vessel.  The mid vessel has 50% stenosis.  The PDA has 40-50% stenosis.  The posterolateral has mild stenosis.  FINAL ASSESSMENT: 1. Moderate diffuse coronary artery disease with ectasia of the right     coronary artery, diffuse stenosis of the LAD and left circumflex. 2. Normal LV function.  RECOMMENDATIONS:  With no critical lesions and moderate diffuse disease, I would recommend continuing with medical management.  I would recommend discontinuing Plavix as this patient has significant thrombocytopenia with platelet count less than 100,000.     Veverly Fells. Excell Seltzer, MD     MDC/MEDQ  D:  01/31/2011  T:  02/01/2011  Job:  [id number]  Electronically Signed by Tonny Bollman MD on 02/20/2011 12:36:45 AM

## 2011-02-20 NOTE — H&P (Signed)
NAMESHIVAAY, STORMONT NO.:  [id number]  MEDICAL RECORD NO.:  [id number]  LOCATION:  2017                         FACILITY:  [facility]  PHYSICIAN:  Doylene Canning. Ladona Ridgel, MD    DATE OF BIRTH:  [birth year]  DATE OF ADMISSION:  01/29/2011 DATE OF DISCHARGE:                             HISTORY & PHYSICAL   PRIMARY CARE PHYSICIAN:  [facility].  PRIMARY CARDIOLOGIST:  Madolyn Frieze. Jens Som, MD, [facility]  CHIEF COMPLAINT:  Chest pain.  HISTORY OF PRESENT ILLNESS:  Mr. Barbary is a 60 year old male with known coronary artery disease.  On January 26, 2011, he did some cocaine.  On January 27, 2011, he had some alcohol.  Last p.m. at approximately 11:00 p.m. at rest, he had onset of substernal chest pain.  He described it as a pressure and tightness.  It radiated at times into his left arm and right neck.  It reached a to 9/10.  It was his usual angina.  He did not try any medications because he did not have his nitro with him.  The symptoms would resolve, but would return.  He was able to sleep.  He had symptoms off and on all night long.  This a.m., he came to the emergency room.  His symptoms were concerning, so Cardiology was asked to evaluate him.  Currently, he is resting comfortably.  PAST MEDICAL HISTORY: 1. 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 2. Status post catheterization in August 2011 showing 70% mid LAD and     99% stenosis in the PL branch of the RCA, which was treated with     PTCA. 3. Status post Myoview in May 2012, which showed mild inferior     ischemia and an EF of 45%. 4. Hypertension. 5. Hyperlipidemia. 6. Hepatitis C. 7. ED. 8. Osteoarthritis.  SURGICAL HISTORY:  Status post cardiac catheterization and hernia repair.  ALLERGIES:  No known drug allergies.  CURRENT MEDICATIONS: 1. Sublingual nitroglycerin p.r.n. 2. Folic acid 1 mg a day. 3. Amlodipine 10 mg a day. 4. Coreg 12.5 mg b.i.d. 5. Lisinopril 20 mg  a day. 6. Crestor 20 mg a day. 7. Aspirin 325 mg a day. 8. Plavix 75 mg a day.  SOCIAL HISTORY:  He lives in [place] with his mother and stepfather. He is unemployed.  He has approximately 30-pack-year history of tobacco use and admits to being an alcoholic and he is a drug addict as well, although he states he rarely drinks or does drugs now because of financial reasons.  FAMILY HISTORY:  Mother and father are both alive, his mother in her 43s and his father in his early 81s, and neither of his parents nor any siblings have coronary artery disease.  He did have a maternal grandparent with coronary artery disease.  REVIEW OF SYSTEMS:  He has not had any diaphoresis and no recent fevers, chills, or sweats.  He has occasional reflux symptoms, but denies melena.  He has occasional arthralgias and joint pains.  The chest pain is described above.  He had some nausea and shortness of breath with the chest pain.  He does not  routinely cough or wheeze.  Full 14-point review of systems is otherwise negative except as stated in the HPI.  PHYSICAL EXAMINATION:  VITAL SIGNS:  Temperature is 98.1, blood pressure 142/92, pulse 99, respiratory rate 18, O2 saturation 99% on room air. GENERAL:  He is a well-developed, well-nourished African American male in no acute distress at rest. HEENT:  Normal. NECK:  There is no lymphadenopathy, thyromegaly, bruit, or JVD noted. CV:  His heart is regular in rate and rhythm with an S1 and S2 and no significant murmur, rub, or gallop is noted.  Distal pulses are intact in all four extremities. LUNGS:  Clear to auscultation bilaterally. SKIN:  No rashes or lesions are noted. ABDOMEN:  Soft and nontender with active bowel sounds. EXTREMITIES:  There is no cyanosis, clubbing, or edema noted. MUSCULOSKELETAL:  There is no joint deformity or effusions and no spine or CVA tenderness. NEURO:  He is alert and oriented with cranial nerves II-XII  grossly intact.  Chest x-ray shows no acute disease.  EKG is sinus rhythm, rate 102, with no acute ischemic changes.  LABORATORY VALUES:  Hemoglobin 13.6, hematocrit 37.6, WBC 2.9, platelets 66.  Sodium 138, potassium 4.5, chloride 104, BUN 8, creatinine 1.2, glucose 82, CK-MB 527/3.1 with a troponin less than 0.3.  IMPRESSION:  Mr. Meyer was seen today by Dr. Ladona Ridgel, the patient was evaluated and the data reviewed.  ASSESSMENT: 1. Chest pain. 2. Cocaine/ethyl alcohol abuse. 3. Hypertension. 4. Known coronary artery disease with recent positive stress test. 5. Dyslipidemia.  PLAN: 1. Admit to telemetry. 2. IV heparin. 3. Left heart cath.     Theodore Demark, PA-C   ______________________________ Doylene Canning. Ladona Ridgel, MD    RB/MEDQ  D:  01/29/2011  T:  01/30/2011  Job:  [id number]  Electronically Signed by Theodore Demark PA-C on 02/18/2011 04:59:01 PM Electronically Signed by Lewayne Bunting MD on 02/20/2011 08:40:59 AM

## 2011-03-03 ENCOUNTER — Encounter: Payer: Self-pay | Admitting: Cardiology

## 2011-03-03 ENCOUNTER — Ambulatory Visit (INDEPENDENT_AMBULATORY_CARE_PROVIDER_SITE_OTHER): Payer: Medicaid Other | Admitting: Cardiology

## 2011-03-03 VITALS — BP 126/82 | HR 66 | Resp 16 | Ht 69.0 in | Wt 181.0 lb

## 2011-03-03 DIAGNOSIS — I10 Essential (primary) hypertension: Secondary | ICD-10-CM

## 2011-03-03 DIAGNOSIS — D696 Thrombocytopenia, unspecified: Secondary | ICD-10-CM

## 2011-03-03 DIAGNOSIS — B1920 Unspecified viral hepatitis C without hepatic coma: Secondary | ICD-10-CM

## 2011-03-03 DIAGNOSIS — E785 Hyperlipidemia, unspecified: Secondary | ICD-10-CM

## 2011-03-03 DIAGNOSIS — R7989 Other specified abnormal findings of blood chemistry: Secondary | ICD-10-CM

## 2011-03-03 NOTE — Patient Instructions (Signed)
Your physician wants you to follow-up in: 6 months You will receive a reminder letter in the mail two months in advance. If you don't receive a letter, please call our office to schedule the follow-up appointment.   REFERRAL TO GI FOR ELEVATED LIVER FUNCTION  FOLLOW UP WITH DR Welton Flakes FOR ELEVATED PLATLETS

## 2011-03-03 NOTE — Progress Notes (Signed)
HPI: 60 year old male with past medical history of coronary disease I initially saw 12/03/10 for evaluation of chest pain. Previously resided in [place]. Patient states he had myocardial infarction in January of 2011, August 2011 in November 2011. The first 2 occurred in the setting of cocaine use. He apparently had PCI following the second event. Review of his records showed that his catheterization revealed a 70% mid LAD and a 99% stenosis in the posterior left ventricular branch of the right coronary artery. He had angioplasty of that lesion in August of 2011.We scheduled a myoview when I saw him previously and was performed 5/12 - mild inferior ischemia and EF 45. Patient continued to have chest pain and cardiac catheterization was therefore performed in June of 2012 and revealed EF 60-65%. Normal LM,  LAD - proximal 30-40% stenosis, mid 60-70% stenosis, and distal 50% stenosis. Left cx - 40-50% mid stenosis and a 50% stenosis into the first OM branch. RCA mid vessel has 50% stenosis.  The PDA   has 40-50% stenosis. Medical therapy recommended. Note patient had thrombocytopenia during his hospitalization. Hematology felt this may be related to his history of alcohol abuse and cirrhosis. He was to followup with gastroenterology as an outpatient for hepatitis C. Since he was last seen, the patient denies any dyspnea on exertion, orthopnea, PND, pedal edema, palpitations, syncope or chest pain.   Current Outpatient Prescriptions  Medication Sig Dispense Refill  . amLODipine (NORVASC) 10 MG tablet Take 10 mg by mouth daily.        Marland Kitchen aspirin 81 MG tablet Take 81 mg by mouth daily.        . carvedilol (COREG) 12.5 MG tablet Take 12.5 mg by mouth 2 (two) times daily with a meal.        . folic acid (FOLVITE) 1 MG tablet Take 1 mg by mouth daily.        Marland Kitchen lisinopril (PRINIVIL,ZESTRIL) 20 MG tablet Take 20 mg by mouth daily.        . nitroGLYCERIN (NITROSTAT) 0.4 MG SL tablet Place 0.4 mg under the tongue every  5 (five) minutes as needed. Up to 3 doses. If pain persist then call 911          Past Medical History  Diagnosis Date  . MI (myocardial infarction)     x3, jan, aug, nov 2011; treated at [facility] in [place]  . Hepatitis C   . Sexual dysfunction   . Arthritis   . HTN (hypertension)   . Hyperlipidemia   . Chronic chest pain   . CAD (coronary artery disease)   . Thrombocytopenia     Past Surgical History  Procedure Date  . Hernia repair     very young    History   Social History  . Marital Status: Single    Spouse Name: N/A    Number of Children: 3  . Years of Education: N/A   Occupational History  .      Unemployed   Social History Main Topics  . Smoking status: Current Everyday Smoker -- 0.3 packs/day for 45 years    Types: Cigarettes  . Smokeless tobacco: Not on file  . Alcohol Use: Yes     Occasional  . Drug Use: No     nothing since 2011; previous heroin, cocaine and Marijuana  . Sexually Active: Not on file   Other Topics Concern  . Not on file   Social History Narrative  . No narrative on file  ROS: no fevers or chills, productive cough, hemoptysis, dysphasia, odynophagia, melena, hematochezia, dysuria, hematuria, rash, seizure activity, orthopnea, PND, pedal edema, claudication. Remaining systems are negative.  Physical Exam: Well-developed well-nourished in no acute distress.  Skin is warm and dry.  HEENT is normal.  Neck is supple. No thyromegaly.  Chest is clear to auscultation with normal expansion.  Cardiovascular exam is regular rate and rhythm.  Abdominal exam nontender or distended. No masses palpated. Extremities show no edema. neuro grossly intact

## 2011-03-03 NOTE — Assessment & Plan Note (Signed)
Patient counseled on discontinuing. 

## 2011-03-03 NOTE — Assessment & Plan Note (Signed)
Will arrange followup with hematology (Dr Park Breed) for thrombocytopenia and leukopenia.

## 2011-03-03 NOTE — Assessment & Plan Note (Signed)
Continue diet.

## 2011-03-03 NOTE — Assessment & Plan Note (Signed)
Blood pressure controlled. Continue present medications. 

## 2011-03-03 NOTE — Assessment & Plan Note (Signed)
Continue aspirin, beta blocker and ACE inhibitor. No statin given history of hepatitis C and elevated liver functions.

## 2011-03-03 NOTE — Assessment & Plan Note (Signed)
Arrange followup with gastroenterology.

## 2011-03-12 NOTE — Consult Note (Signed)
Patrick [name], Patrick [name] NO.:  [id number]  MEDICAL RECORD NO.:  [id number]  LOCATION:  2034                         FACILITY:  [facility]  PHYSICIAN:  Drue Second, M.D.     DATE OF BIRTH:  07-04-1951  DATE OF CONSULTATION:  01/30/2011 DATE OF DISCHARGE:                                CONSULTATION   REFERRING PHYSICIAN:  Madolyn Frieze. Jens Som, MD, FACC  REASON FOR CONSULTATION:  A 60 year old gentleman with thrombocytopenia and leukopenia, likely chronic.  HISTORY OF PRESENT ILLNESS:  The patient is a 60 year old African American gentleman with history of coronary artery disease, substance abuse, hepatitis C, and alcohol abuse.  He was admitted on January 29, 2011, with substernal chest pain starting at 11 p.m. on January 29, 2011. He was admitted through the emergency room.  A CBC was performed at the time of admission and he was found to have a platelet count of 66,000 and a white count of 2.9.  Because of his coronary artery disease and history of MIs, a rule-out MI protocol was instituted.  He was begun on intravenous heparin.  Repeat counts today on January 30, 2011, revealed the platelet count had dropped to 60,000 and the white count was now 2.7. The patient himself does not recall having had been told in the past of having low blood counts.  He also tells me that he is followed at [facility] and he does not recall anyone having drawn his blood in the past.  He does note that he has had a history of hepatitis C for about 20 years or so.  He has not been treated for it.  He denies having any bleeding problems.  He has no hematuria, hematochezia, melena, hemoptysis, or hematemesis.  He has no nausea or vomiting.  No fevers, chills, or night sweats.  No headaches.  He has had an HIV test performed about a year ago.  He does continue to have alcohol use as well as substance abuse such as cocaine.  PAST MEDICAL HISTORY:  Significant for having had MIs on  multiple occasions including January 2011, August 2011, and November 2011.  He has had PTCA in the past.  He does have hypertension, hyperlipidemia, again hepatitis C, and osteoarthritis.  PAST SURGICAL HISTORY:  He has had cardiac catheterization and hernia repair.  ALLERGIES:  No known drug allergies.  CURRENT MEDICATIONS:  Recorded in the electronic medical record.  SOCIAL HISTORY:  The patient lives in [place].  He is unemployed.  He does smoke about 1 pack of cigarettes a day.  He is a heavy alcohol user and also has a substance abuse history.  FAMILY HISTORY:  Both mother and father are alive.  There is history of coronary artery disease.  No history of blood disorders or cancers.  REVIEW OF SYSTEMS:  As in the HPI.  PHYSICAL EXAMINATION:  GENERAL:  The patient is awake, alert-appearing gentleman, in no acute distress. VITAL SIGNS:  Temperature 98.3, pulse 68, respirations 18, and blood pressure 128/79. HEENT:  EOMI.  PERRL.  Sclerae are anicteric.  No conjunctival pallor. Oral mucosa is moist.  No thrush, no mucositis. NECK:  Supple. LUNGS:  Some rales in the left side, otherwise clear. CARDIOVASCULAR:  Regular rate and rhythm.  No murmurs, gallops, or rubs. ABDOMEN:  Soft, nontender.  It is distended.  There is fullness to the flanks.  There is also noted to be dullness to percussion in the flanks. Liver edge is barely palpable.  Spleen is not palpable. EXTREMITIES:  No edema. NEURO:  The patient is alert and oriented, otherwise nonfocal.  LABORATORY DATA:  WBC 2.7, hemoglobin 12.4, hematocrit 34.3, and platelets are 60,000, down from 66,000 yesterday.  Review of smear does reveal low platelet count with some giant platelets and reduced white count with no morphologic abnormalities.  IMPRESSION AND PLAN:  A 60 year old gentleman was admitted with substernal chest pain being ruled out for an myocardial infarction with previous history of coronary artery disease  and history of having myocardial infarctions.  He apparently is actively using alcohol as well as recent use of cocaine.  He has had history of hepatitis C and most likely has underlying cirrhosis due to hep C and alcohol use.  The patient states that he has never been treated for hepatitis C.  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  My recommendations at this time is for the patient to have a complete hep panel and have HIV testing done and he is consented to this.  He should also have a CT of the abdomen and pelvis performed to evaluate the size of the spleen as well as the liver.  Liver function studies should be performed.  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  I have discussed the workup with the patient and he concurs.  In terms of having a cardiac catheterization done, there is a concern about his low platelets, that a cath is necessary and question is whether he would be able to get it done due to his low platelet count.  My recommendation would be to transfuse him platelets to get the cath done if appropriate.  He certainly could use apheresis unit of platelets and the count can be performed later.  I will continue to follow the patient.  Thank you very much.     Drue Second, M.D.     KK/MEDQ  D:  01/30/2011  T:  01/31/2011  Job:  [id number]  Electronically Signed by Drue Second MD on 03/12/2011 09:21:42 AM

## 2011-04-03 ENCOUNTER — Encounter (HOSPITAL_BASED_OUTPATIENT_CLINIC_OR_DEPARTMENT_OTHER): Payer: Medicaid Other | Admitting: Oncology

## 2011-04-03 ENCOUNTER — Other Ambulatory Visit: Payer: Self-pay | Admitting: Oncology

## 2011-04-03 DIAGNOSIS — D6959 Other secondary thrombocytopenia: Secondary | ICD-10-CM

## 2011-04-03 LAB — COMPREHENSIVE METABOLIC PANEL
AST: 40 U/L — ABNORMAL HIGH (ref 0–37)
Albumin: 4.2 g/dL (ref 3.5–5.2)
Alkaline Phosphatase: 56 U/L (ref 39–117)
BUN: 12 mg/dL (ref 6–23)
Creatinine, Ser: 1.02 mg/dL (ref 0.50–1.35)
Potassium: 4.6 mEq/L (ref 3.5–5.3)

## 2011-04-03 LAB — CBC WITH DIFFERENTIAL/PLATELET
Basophils Absolute: 0 10*3/uL (ref 0.0–0.1)
EOS%: 4.9 % (ref 0.0–7.0)
HGB: 13.2 g/dL (ref 13.0–17.1)
MCH: 32 pg (ref 27.2–33.4)
MCV: 93.1 fL (ref 79.3–98.0)
MONO%: 17.4 % — ABNORMAL HIGH (ref 0.0–14.0)
RDW: 15.2 % — ABNORMAL HIGH (ref 11.0–14.6)

## 2011-04-18 ENCOUNTER — Emergency Department (HOSPITAL_COMMUNITY)
Admission: EM | Admit: 2011-04-18 | Discharge: 2011-04-18 | Disposition: A | Discharge status: Home / Self Care | Payer: Medicaid Other | Attending: Emergency Medicine | Admitting: Emergency Medicine

## 2011-04-18 DIAGNOSIS — I252 Old myocardial infarction: Secondary | ICD-10-CM | POA: Insufficient documentation

## 2011-04-18 DIAGNOSIS — E7800 Pure hypercholesterolemia, unspecified: Secondary | ICD-10-CM | POA: Insufficient documentation

## 2011-04-18 DIAGNOSIS — Z79899 Other long term (current) drug therapy: Secondary | ICD-10-CM | POA: Insufficient documentation

## 2011-04-18 DIAGNOSIS — L089 Local infection of the skin and subcutaneous tissue, unspecified: Secondary | ICD-10-CM | POA: Insufficient documentation

## 2011-04-18 DIAGNOSIS — R21 Rash and other nonspecific skin eruption: Secondary | ICD-10-CM | POA: Insufficient documentation

## 2011-04-18 DIAGNOSIS — M25429 Effusion, unspecified elbow: Secondary | ICD-10-CM | POA: Insufficient documentation

## 2011-04-18 DIAGNOSIS — Z7982 Long term (current) use of aspirin: Secondary | ICD-10-CM | POA: Insufficient documentation

## 2011-04-18 DIAGNOSIS — W57XXXA Bitten or stung by nonvenomous insect and other nonvenomous arthropods, initial encounter: Secondary | ICD-10-CM | POA: Insufficient documentation

## 2011-04-18 DIAGNOSIS — L2989 Other pruritus: Secondary | ICD-10-CM | POA: Insufficient documentation

## 2011-04-18 DIAGNOSIS — L298 Other pruritus: Secondary | ICD-10-CM | POA: Insufficient documentation

## 2011-04-18 DIAGNOSIS — I10 Essential (primary) hypertension: Secondary | ICD-10-CM | POA: Insufficient documentation

## 2011-04-18 LAB — POCT I-STAT, CHEM 8
BUN: 3 mg/dL — ABNORMAL LOW (ref 6–23)
Calcium, Ion: 1.14 mmol/L (ref 1.12–1.32)
Chloride: 105 mEq/L (ref 96–112)
Creatinine, Ser: 1.3 mg/dL (ref 0.50–1.35)
Glucose, Bld: 96 mg/dL (ref 70–99)

## 2011-04-18 LAB — CBC
MCH: 32.2 pg (ref 26.0–34.0)
MCV: 89.3 fL (ref 78.0–100.0)
Platelets: 71 10*3/uL — ABNORMAL LOW (ref 150–400)
RDW: 15.9 % — ABNORMAL HIGH (ref 11.5–15.5)

## 2011-04-18 LAB — DIFFERENTIAL
Eosinophils Absolute: 0.1 10*3/uL (ref 0.0–0.7)
Eosinophils Relative: 2 % (ref 0–5)
Lymphs Abs: 1.1 10*3/uL (ref 0.7–4.0)
Monocytes Relative: 12 % (ref 3–12)

## 2011-06-02 ENCOUNTER — Encounter (HOSPITAL_BASED_OUTPATIENT_CLINIC_OR_DEPARTMENT_OTHER): Payer: Medicaid Other | Admitting: Oncology

## 2011-06-02 ENCOUNTER — Other Ambulatory Visit: Payer: Self-pay | Admitting: Oncology

## 2011-06-02 DIAGNOSIS — D6959 Other secondary thrombocytopenia: Secondary | ICD-10-CM

## 2011-06-02 DIAGNOSIS — D72819 Decreased white blood cell count, unspecified: Secondary | ICD-10-CM

## 2011-06-02 LAB — CBC WITH DIFFERENTIAL/PLATELET
Eosinophils Absolute: 0.2 10*3/uL (ref 0.0–0.5)
HCT: 38.8 % (ref 38.4–49.9)
LYMPH%: 42.9 % (ref 14.0–49.0)
MCHC: 34.3 g/dL (ref 32.0–36.0)
MCV: 92.4 fL (ref 79.3–98.0)
MONO%: 13.8 % (ref 0.0–14.0)
NEUT#: 1.3 10*3/uL — ABNORMAL LOW (ref 1.5–6.5)
NEUT%: 36.1 % — ABNORMAL LOW (ref 39.0–75.0)
Platelets: 109 10*3/uL — ABNORMAL LOW (ref 140–400)
RBC: 4.2 10*6/uL (ref 4.20–5.82)

## 2011-08-19 ENCOUNTER — Encounter: Payer: Self-pay | Admitting: *Deleted

## 2011-08-19 NOTE — Progress Notes (Signed)
Faxed received from [facility]- pt is scheduled for appt [facility] on [address] for 10/02/11 at 2:45pm

## 2011-09-17 ENCOUNTER — Encounter: Payer: Self-pay | Admitting: Internal Medicine

## 2011-10-02 ENCOUNTER — Ambulatory Visit: Payer: Self-pay | Admitting: Gastroenterology

## 2011-10-02 ENCOUNTER — Ambulatory Visit (INDEPENDENT_AMBULATORY_CARE_PROVIDER_SITE_OTHER): Payer: Medicaid Other | Admitting: Gastroenterology

## 2011-10-02 DIAGNOSIS — K7030 Alcoholic cirrhosis of liver without ascites: Secondary | ICD-10-CM

## 2011-10-02 DIAGNOSIS — K766 Portal hypertension: Secondary | ICD-10-CM

## 2011-10-02 DIAGNOSIS — B182 Chronic viral hepatitis C: Secondary | ICD-10-CM

## 2011-10-03 LAB — CBC WITH DIFFERENTIAL/PLATELET
Basophils Absolute: 0 10*3/uL (ref 0.0–0.1)
Basophils Relative: 0 % (ref 0–1)
Eosinophils Absolute: 0.2 10*3/uL (ref 0.0–0.7)
Eosinophils Relative: 5 % (ref 0–5)
HCT: 41.8 % (ref 39.0–52.0)
MCH: 31.6 pg (ref 26.0–34.0)
MCHC: 34.9 g/dL (ref 30.0–36.0)
Monocytes Absolute: 0.4 10*3/uL (ref 0.1–1.0)
Neutro Abs: 1 10*3/uL — ABNORMAL LOW (ref 1.7–7.7)
RDW: 14.5 % (ref 11.5–15.5)

## 2011-10-03 LAB — AFP TUMOR MARKER: AFP-Tumor Marker: 4.4 ng/mL (ref 0.0–8.0)

## 2011-10-03 LAB — HEPATITIS B SURFACE ANTIGEN: Hepatitis B Surface Ag: NEGATIVE

## 2011-10-03 LAB — FERRITIN: Ferritin: 213 ng/mL (ref 22–322)

## 2011-10-03 LAB — HEPATITIS A ANTIBODY, TOTAL: Hep A Total Ab: POSITIVE — AB

## 2011-10-03 LAB — HEPATITIS B CORE ANTIBODY, TOTAL: Hep B Core Total Ab: POSITIVE — AB

## 2011-10-04 LAB — COMPLETE METABOLIC PANEL WITH GFR
AST: 57 U/L — ABNORMAL HIGH (ref 0–37)
Alkaline Phosphatase: 66 U/L (ref 39–117)
BUN: 11 mg/dL (ref 6–23)
Creat: 1.06 mg/dL (ref 0.50–1.35)
GFR, Est Non African American: 76 mL/min
Glucose, Bld: 74 mg/dL (ref 70–99)
Potassium: 4 mEq/L (ref 3.5–5.3)
Total Bilirubin: 0.4 mg/dL (ref 0.3–1.2)

## 2011-10-04 LAB — IRON AND TIBC
%SAT: 26 % (ref 20–55)
TIBC: 370 ug/dL (ref 215–435)

## 2011-10-08 LAB — HEPATITIS C GENOTYPE

## 2011-10-09 NOTE — Progress Notes (Addendum)
[name], [name]    MR#:  [id number]      DATE:  10/02/2011  DOB:      cc: Consulting Physician:  Patrick [name]. Patrick Som, MD  Consulting physician: Patrick Head, MD, [facility]  Primary care physician:  Patrick Livings, MD,  [facility], [address], [place], [place] [address], Phone [phone]  Referring physician:  Drue Second, MD, Fax [phone]    REASON FOR REFERRAL:  Hepatitis C and alcohol-induced cirrhosis.   History:  The patient is a 61 year old gentleman who I have been asked to see in consultation, by Dr. 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  He reports that, sometime in 2011, he was hospitalized at [facility] in [place] for coronary artery disease. It was possible that during hospitalization at [facility] he was found to  have hepatitis C. He recalls being sent to a gastroenterologist or possibly a hepatologist at [facility] to discuss his hepatitis C. He is unaware of his genotype and has never been biopsied. He  recalled that when he was seen for his hepatitis C, he was anticoagulated and so it was decided he was to return for follow up, when he was off anticoagulation, for further evaluation, possibly  liver biopsy. He then moved from [place] to [place] and did not follow up.  More recently, he was hospitalized on 01/29/2011, at [facility] for chest pain, having used cocaine on 01/26/2011. During this hospitalization, he was seen in consultation by Dr. Welton [name] regarding  thrombocytopenia. It was Dr. Milta [name] suspicion that the cytopenias were due to his cirrhosis from a combination of alcohol and hepatitis C. During the hospitalization, a CT scan was suggested to further  evaluate his spleen size and his liver. This showed surprisingly that, on 01/31/2011, the liver was normal.  The spleen was normal in size. There is no mention of changes of  portal hypertension nor was there  ascites.  There is no history of peripheral edema or ascites. There is no history of variceal bleeding. He has never been screened for varices, that he is aware of. However, he reports he is to have a colonoscopy  in 2013 with possibly Dr. Stan [name].  There is no symptoms of encephalopathy. There are no symptoms to suggest cryoglobulin mediated disease. His HCC screening was accomplished by CT scan of the abdomen, with contrast on 01/31/2011.  With respect to risk factors for liver disease, he admits to alcohol abuse more heavily in the past and gradually tapering off to the point were he has not had any alcohol since December 2012. He admits to a  history of intravenous drug use as a teenager, which eventually transformed into crack cocaine and intranasal cocaine use up until 01/26/2011. There is no history of blood transfusions or tattoos, but  he may have a history of unsterile body piercing. His family history is significant for brother who has hepatitis C and is being treated. He has not been immunized against hepatitis A or B that he can recall.   PAST MEDICAL HISTORY:  Significant for coronary artery disease. The old notes indicate that he was hospitalized in [place] in January, August, November 2011, for myocardial infarction. The first 2 of these hospitalizations occurred  in the setting of cocaine use. During the [name] event, he had angioplasty.  During his hospitalization in [place] on 01/31/2011, he underwent a left heart cardiac catheterization at [facility], which  showed  diffuse stenosis along the LAD with a proximal 30%-40% stenosis, mid 60%-70% and distal 50% stenosis. The left circumflex had a 40%-50% mid stenosis, a 50% stenosis into the first obtuse marginal  branch. The right coronary artery contained 50% midvessel stenosis. The PDA had 40%-50% stenosis. The ejection fraction was thought to be  60%-65%. It was thought that he should have  medical management only because there was no critical lesion.    PAST MEDICAL HISTORY:  Significant for hypertension and dyslipidemia.   PAST SURGICAL HISTORY:  Right inguinal herniorrhaphy.    Past psychiatric history:  None, other than being seen by a therapist while in drug rehabilitation in the past.    MEDICATIONS:  Lisinopril 20 mg daily, amlodipine 10 mg daily, folic acid 1 mg daily, carvedilol  12.5 mg p.o. b.i.d., aspirin 81 mg daily, Hydrocodone/acetaminophen 5/325 mg p.r.n. for pain, Nitrostat 0.4 mg p.r.n., which he rarely uses, once a day multivitamin.    Allergies:  Denies.    Habits:  Smoking, perhaps 1 pack will last him a week. Alcohol as above.   FAMILY HISTORY:  As above.   SOCIAL HISTORY:  Single with 1 daughter age 54 and a son age 45. He is currently on disability secondary to his coronary disease.   REVIEW OF SYSTEMS:  All 10 systems reviewed today on the review of systems form, which was signed and placed in the chart. His exercise tolerance is such that he  is able to walk half a mile, before he becomes fatigued but denies chest pain.  His CES-D was incomplete.   PHYSICAL EXAMINATION:   Constitutional:  Stated age without significant bitemporal wasting. Vital signs: Height 69 inches, weight 188 pounds, blood pressure 119/95, pulse 66, temperature 98.1 Fahrenheit.  Ears, nose, mouth and  throat:  Unremarkable oropharynx.  No thyromegaly or neck masses.  Chest:  Resonant to percussion.  Clear to auscultation.  Cardiovascular:  Heart sounds normal S1, S2 without murmurs or rubs.   There is no peripheral edema.  Abdominal:  Normal bowel sounds.  No masses or tenderness.  I could not appreciate a liver edge or spleen tip.  I could not appreciate any hernias.  Lymphatics:  No cervical or  inguinal lymphadenopathy.  Central Nervous System:  No asterixis or focal neurologic findings.  Dermatologic:  Anicteric without palmar  erythema or spider angiomata.  Eyes:   Anicteric sclerae.  Pupils are equal and reactive to light.   Laboratories:  Reviewed within [facility]. Most recently on 06/02/2011, CBC: White count 2.5, hemoglobin 13.3, MCV 92.4, platelets 109.  Last liver tests were done on 04/03/2011, at which time his AST was 40, ALT 58, ALP 56, total bilirubin 0.3, albumin was 4.2.   Assessment:  The patient is a 61 year old gentleman with history of hepatitis C, genotype unknown, and alcohol-induced liver disease if not cirrhosis, which could explain his thrombocytopenia. Despite the CT scan of  01/31/2011, showing the liver was normal in size as was the spleen, I suspect that this was misread, and he probably has more evidence of cirrhosis and portal hypertension then suggested by the report.  In terms of treating his genotype unknown, hepatitis C, first of course will need to genotype him. [name], my concern is that of his coronary artery disease. I would like to have Dr. Ludwig Clarks thoughts as to his ability to tolerate anemia from treatment, which could bring his hemoglobin down to the 8 range. I note that his stress test of 12/17/2010, showed a  mild reversible inferior defect suggestive of  ischemia. In addition, his ejection fraction was 45%. It would be my contention, unless Dr. Jens [name] feels differently, that given his cardiac history, the patient is not a candidate for current  therapy for his hepatitis C, considering that treatment will induce anemia. Certainly if he feels differently, I would welcome his comments.  In terms of the patient's cirrhosis, he is thrombocytopenic, but has not been screened for varices. He is going to have an colonoscopy on 11/11/2011, with Dr. Leone Payor, so I suggest an EGD, as well.  As previously mentioned, he has never bled.  In terms of hepatocellular cancer screening, he was screened by CT scanning of the abdomen in June 2012. Another scan will be due by June 2013. However, with Medicaid, which is his insurance, they will  not allow anything more than an ultrasound unless there is a history of a liver mass, so that he could start surveillance for hepatocellular cancer with ultrasound now. There is no encephalopathy or ascites to treat.  He has not been vaccinated against hepatitis A or B.  I will check serologies today. He needs a Pneumovax and flu shot of course, which I do not have in my office, but he should obtain this through his primary physician.  Although he does not, strictly speaking, need to be screened for varices if he remains on carvedilol, it would be useful to know if he has portal hypertensive changes to go along with his thrombocytopenia   considering the CT scan was read as being normal, which I suspect has been misread.  In my discussion today with the patient, we discussed the diagnosis of hepatitis C, its nature and natural history. We discussed the possibility that he has cirrhosis of the liver, and its implications.  We discussed screening for hepatocellular cancer with imaging, as well as varices by endoscopy. I discussed the importance of remaining on the carvedilol. We then discussed treatment of hepatitis C. I stated I  would need to know his genotype, and I have explained to him it is possible that his coronary artery disease would preclude him from being treated.   PLAN:  1. By way of this note, I would ask for Dr. Ludwig Clarks thoughts as to the patient's ability to tolerate therapy for hepatitis C with anemia that could cause his hemoglobin to go as low as 8. 2. I will check standard labs including liver enzymes, CBC with differential. 3. Test for hepatitis A and B immunity. 4. Test hepatitis C genotype. 5. Order ultrasound of the liver.  6. Ask Dr. Leone Payor to perform an EGD on 11/11/2011, with the colonoscopy. 7. Literature on hepatitis C given. 8. Return in approximately 2 month's time in follow up.            Brooke Dare, MD  ADDENDUM  Genotype 1b.  Hep A immune.  Hep B core Ab  positive but surface Ab negative. Plts 96, suggesting cirrhosis.  But his synthetic function is normal.    ADDENDUM  10/10/11   Ok for therapy from cardiac standpoint  Olga Millers   [facility]  11/11/11  EGD Short segment (< 1 cm) Barrett's.  No varices.  Portal Gastropathy.  (colonoscopy report also in Epic)  [phone]  D:  Thu Feb 28 19:17:22 2013 ; T:  Thu Feb 28 20:42:28 2013  Job #:  [id number]

## 2011-10-15 ENCOUNTER — Telehealth: Payer: Self-pay

## 2011-10-15 NOTE — Telephone Encounter (Signed)
Patient is scheduled for double per Dr Leone Payor.  Ok for double on 11/11/11 per [facility].

## 2011-10-28 ENCOUNTER — Ambulatory Visit (AMBULATORY_SURGERY_CENTER): Payer: Medicaid Other | Admitting: *Deleted

## 2011-10-28 VITALS — Ht 69.0 in | Wt 186.0 lb

## 2011-10-28 DIAGNOSIS — Z1211 Encounter for screening for malignant neoplasm of colon: Secondary | ICD-10-CM

## 2011-10-28 DIAGNOSIS — I8500 Esophageal varices without bleeding: Secondary | ICD-10-CM

## 2011-10-28 MED ORDER — PEG-KCL-NACL-NASULF-NA ASC-C 100 G PO SOLR: ORAL | Status: DC

## 2011-11-11 ENCOUNTER — Encounter: Payer: Self-pay | Admitting: Internal Medicine

## 2011-11-11 ENCOUNTER — Ambulatory Visit (AMBULATORY_SURGERY_CENTER): Payer: Medicaid Other | Admitting: Internal Medicine

## 2011-11-11 VITALS — BP 117/76 | HR 63 | Temp 98.4°F | Resp 20 | Ht 69.0 in | Wt 186.0 lb

## 2011-11-11 DIAGNOSIS — B1920 Unspecified viral hepatitis C without hepatic coma: Secondary | ICD-10-CM

## 2011-11-11 DIAGNOSIS — D126 Benign neoplasm of colon, unspecified: Secondary | ICD-10-CM

## 2011-11-11 DIAGNOSIS — I8500 Esophageal varices without bleeding: Secondary | ICD-10-CM

## 2011-11-11 DIAGNOSIS — K319 Disease of stomach and duodenum, unspecified: Secondary | ICD-10-CM

## 2011-11-11 DIAGNOSIS — Z1211 Encounter for screening for malignant neoplasm of colon: Secondary | ICD-10-CM

## 2011-11-11 DIAGNOSIS — K2270 Barrett's esophagus without dysplasia: Secondary | ICD-10-CM

## 2011-11-11 DIAGNOSIS — R933 Abnormal findings on diagnostic imaging of other parts of digestive tract: Secondary | ICD-10-CM

## 2011-11-11 DIAGNOSIS — Z86010 Personal history of colonic polyps: Secondary | ICD-10-CM

## 2011-11-11 DIAGNOSIS — K648 Other hemorrhoids: Secondary | ICD-10-CM

## 2011-11-11 HISTORY — PX: ESOPHAGOGASTRODUODENOSCOPY: SHX1529

## 2011-11-11 HISTORY — DX: Benign neoplasm of colon, unspecified: D12.6

## 2011-11-11 HISTORY — DX: Barrett's esophagus without dysplasia: K22.70

## 2011-11-11 HISTORY — PX: COLONOSCOPY: SHX174

## 2011-11-11 MED ORDER — SODIUM CHLORIDE 0.9 % IV SOLN: 500.0000 mL | INTRAVENOUS | Status: DC

## 2011-11-11 NOTE — Patient Instructions (Addendum)
AVOID ASPIRIN PRODUCTS AND NSAIDS(MOTRIN, ALEVE, IBUPROFEN TYPE PRODUCTS) FOR TWO [facility] November 25, 2011.YOU HAD AN ENDOSCOPIC PROCEDURE TODAY AT THE [facility]: Refer to the procedure report that was given to you for any specific questions about what was found during the examination.  If the procedure report does not answer your questions, please call your gastroenterologist to clarify.  If you requested that your care partner not be given the details of your procedure findings, then the procedure report has been included in a sealed envelope for you to review at your convenience later.  YOU SHOULD EXPECT: Some feelings of bloating in the abdomen. Passage of more gas than usual.  Walking can help get rid of the air that was put into your GI tract during the procedure and reduce the bloating. If you had a lower endoscopy (such as a colonoscopy or flexible sigmoidoscopy) you may notice spotting of blood in your stool or on the toilet paper. If you underwent a bowel prep for your procedure, then you may not have a normal bowel movement for a few days.  DIET: Your first meal following the procedure should be a light meal and then it is ok to progress to your normal diet.  A half-sandwich or bowl of soup is an example of a good first meal.  Heavy or fried foods are harder to digest and may make you feel nauseous or bloated.  Likewise meals heavy in dairy and vegetables can cause extra gas to form and this can also increase the bloating.  Drink plenty of fluids but you should avoid alcoholic beverages for 24 hours.  ACTIVITY: Your care partner should take you home directly after the procedure.  You should plan to take it easy, moving slowly for the rest of the day.  You can resume normal activity the day after the procedure however you should NOT DRIVE or use heavy machinery for 24 hours (because of the sedation medicines used during the test).    SYMPTOMS TO REPORT IMMEDIATELY: A  gastroenterologist can be reached at any hour.  During normal business hours, 8:30 AM to 5:00 PM Monday through Friday, call [phone].  After hours and on weekends, please call the GI answering service at [phone] who will take a message and have the physician on call contact you.   Following lower endoscopy (colonoscopy or flexible sigmoidoscopy):  Excessive amounts of blood in the stool  Significant tenderness or worsening of abdominal pains  Swelling of the abdomen that is new, acute  Fever of 100F or higher  Following upper endoscopy (EGD)  Vomiting of blood or coffee ground material  New chest pain or pain under the shoulder blades  Painful or persistently difficult swallowing  New shortness of breath  Fever of 100F or higher  Black, tarry-looking stools  FOLLOW UP: If any biopsies were taken you will be contacted by phone or by letter within the next 1-3 weeks.  Call your gastroenterologist if you have not heard about the biopsies in 3 weeks.  Our staff will call the home number listed on your records the next business day following your procedure to check on you and address any questions or concerns that you may have at that time regarding the information given to you following your procedure. This is a courtesy call and so if there is no answer at the home number and we have not heard from you through the emergency physician on call, we will assume that you have  returned to your regular daily activities without incident.  SIGNATURES/CONFIDENTIALITY: You and/or your care partner have signed paperwork which will be entered into your electronic medical record.  These signatures attest to the fact that that the information above on your After Visit Summary has been reviewed and is understood.  Full responsibility of the confidentiality of this discharge information lies with you and/or your care-partner.

## 2011-11-11 NOTE — Progress Notes (Signed)
Patient did not experience any of the following events: a burn prior to discharge; a fall within the facility; wrong site/side/patient/procedure/implant event; or a hospital transfer or hospital admission upon discharge from the facility. (G8907) Patient did not have preoperative order for IV antibiotic SSI prophylaxis. (G8918)  

## 2011-11-11 NOTE — Op Note (Signed)
[facility] [address] [place], [place]  [address]  COLONOSCOPY PROCEDURE REPORT  PATIENT:  Patrick [name], Patrick [name]  MR#:  [id number] BIRTHDATE:  10-02-1950, 60 yrs. old  GENDER:  male ENDOSCOPIST:  Iva Boop, MD, [facility] REF. BY:  Clyda Greener, M.D. PROCEDURE DATE:  11/11/2011 PROCEDURE:  Colonoscopy with snare polypectomy ASA CLASS:  Class III INDICATIONS:  Routine Risk Screening MEDICATIONS:   These medications were titrated to patient response per physician's verbal order, Fentanyl 50 mcg IV, Versed 4 mg IV  DESCRIPTION OF PROCEDURE:   After the risks benefits and alternatives of the procedure were thoroughly explained, informed consent was obtained.  Digital rectal exam was performed and revealed no abnormalities and normal prostate.   The LB CF-H180AL [id number] endoscope was introduced through the anus and advanced to the cecum, which was identified by both the appendix and ileocecal valve, without limitations.  The quality of the prep was excellent, using MoviPrep.  The instrument was then slowly withdrawn as the colon was fully examined. <<PROCEDUREIMAGES>>  FINDINGS:  Five polyps were found. 8-10 mm ascending polyp removed with snare cautery. Two descending polyps largest 7mm, removed cold snare. Two sigmoid polyps 3 and 5 mm removed cold snare. 2.5 cm rectal polyp removed with piecemeal snare cautery.  Scattered diverticula were found in the sigmoid colon.  This was otherwise a normal examination of the colon. Includes right colon retroflexion.   Retroflexed views in the rectum revealed internal hemorrhoids.    The time to cecum = 5:07 minutes. The scope was then withdrawn in 20:15 minutes from the cecum and the procedure completed. COMPLICATIONS:  None ENDOSCOPIC IMPRESSION: 1) Five polyps removed, largest 2.5 cm sessile rectal polyp 2) Diverticula, scattered in the sigmoid colon 3) Internal hemorrhoids 4) Otherwise normal examination, excellent  prep RECOMMENDATIONS: 1) Hold aspirin, aspirin products, and anti-inflammatory medication for 2 weeks. REPEAT EXAM:  In for Colonoscopy, pending biopsy results. within 6 months I suspect  Iva Boop, MD, [facility]  CC:  Clyda Greener, MD, Brooke Dare, MD and The Patient  n. eSIGNED:   Iva Boop at 11/11/2011 11:34 AM  Donnal Moat, [phone]

## 2011-11-11 NOTE — Op Note (Addendum)
[facility] [address] [place], [place]  [address]  ENDOSCOPY PROCEDURE REPORT  PATIENT:  Omri, Bertran  MR#:  [id number] BIRTHDATE:  September 08, 1950, 60 yrs. old  GENDER:  male  ENDOSCOPIST:  Iva Boop, MD, [facility] Referred by:  Brooke Dare, M.D.  PROCEDURE DATE:  11/11/2011 PROCEDURE:  EGD with biopsy, [id number] ASA CLASS:  Class III INDICATIONS:  screen for varices in liver disease (HCV with thrombocytopenia)  MEDICATIONS:   These medications were titrated to patient response per physician's verbal order, Fentanyl 50 mcg IV, Versed 6 mg IV TOPICAL ANESTHETIC:  Cetacaine Spray  DESCRIPTION OF PROCEDURE:   After the risks benefits and alternatives of the procedure were thoroughly explained, informed consent was obtained.  The LB GIF-H180 [id number] endoscope was introduced through the mouth and advanced to the second portion of the duodenum, without limitations.  The instrument was slowly withdrawn as the mucosa was fully examined. <<PROCEDUREIMAGES>>  There were columnar-type mucosal changes in the distal esophagus, that could represent Barrett's esophagus. in the distal esophagus. Three small (<1cm) tongues of columnar-appearing mucosa above z-line (40 cm). Multiple biopsies were obtained and sent to pathology.  Abnormal appearing mucosa in the body and the antrum of the stomach. Red spots and erythema. ? portal gastropathy vs. gastritis. Multiple biopsies were obtained and sent to pathology. Otherwise the examination was normal.    Retroflexed views revealed no abnormalities.    The scope was then withdrawn from the patient and the procedure completed.  COMPLICATIONS:  None  ENDOSCOPIC IMPRESSION: 1) Barrett's, possible in the distal esophagus 2) Abnormal mucosa in the body and the antrum of the stomach - ? portal gastropathy 3) Otherwise normal examination RECOMMENDATIONS: 1) Await biopsy results  Iva Boop, MD, [facility]  CC:  Brooke Dare, MD and The  Patient addendum: CC: Clyda Greener, MD  n. REVISED:  11/11/2011 11:35 AM eSIGNED:   Iva Boop at 11/11/2011 11:35 AM  Donnal Moat, [phone]

## 2011-11-12 ENCOUNTER — Telehealth: Payer: Self-pay | Admitting: *Deleted

## 2011-11-12 NOTE — Telephone Encounter (Signed)
Message left for patient

## 2011-11-17 ENCOUNTER — Encounter: Payer: Self-pay | Admitting: Internal Medicine

## 2011-11-17 DIAGNOSIS — K2270 Barrett's esophagus without dysplasia: Secondary | ICD-10-CM

## 2011-11-17 HISTORY — DX: Barrett's esophagus without dysplasia: K22.70

## 2011-11-17 NOTE — Progress Notes (Signed)
Quick Note:  1) Short-segment Barrett's - recall EGD about 11/2014 2) 5 adenomas - largest 2.5 cm rectal TV adenoma - repeat colonoscopy by Sept/Oct 2013 - recall for August ______

## 2011-11-17 NOTE — Progress Notes (Signed)
Quick Note:  Office  1) Call patient and let him know no cancer but needs to start omeprazole 20 mg daily for GERD/Barrett's - please Rx #[id number] refills 2) Needs REV with me to review results further - May/June ok - he is going to need a colonoscopy in 3-6 months to ensure rectal polyp removed completely   [facility]  1) EGD recall 11/2014 2) Colon recall Aug 2013  No letter needed  ______

## 2011-11-18 ENCOUNTER — Other Ambulatory Visit: Payer: Self-pay

## 2011-11-18 MED ORDER — OMEPRAZOLE 20 MG PO CPDR: 20.0000 mg | DELAYED_RELEASE_CAPSULE | Freq: Every day | ORAL | Status: DC

## 2011-12-22 ENCOUNTER — Telehealth: Payer: Self-pay | Admitting: Cardiovascular Disease

## 2011-12-24 ENCOUNTER — Ambulatory Visit (INDEPENDENT_AMBULATORY_CARE_PROVIDER_SITE_OTHER): Payer: Medicaid Other | Admitting: Cardiology

## 2011-12-24 ENCOUNTER — Encounter: Payer: Self-pay | Admitting: Cardiology

## 2011-12-24 VITALS — BP 113/77 | HR 64 | Ht 69.0 in | Wt 185.0 lb

## 2011-12-24 DIAGNOSIS — Z720 Tobacco use: Secondary | ICD-10-CM

## 2011-12-24 DIAGNOSIS — I10 Essential (primary) hypertension: Secondary | ICD-10-CM

## 2011-12-24 DIAGNOSIS — F17200 Nicotine dependence, unspecified, uncomplicated: Secondary | ICD-10-CM

## 2011-12-24 DIAGNOSIS — I2510 Atherosclerotic heart disease of native coronary artery without angina pectoris: Secondary | ICD-10-CM

## 2011-12-24 DIAGNOSIS — R079 Chest pain, unspecified: Secondary | ICD-10-CM

## 2011-12-24 DIAGNOSIS — E785 Hyperlipidemia, unspecified: Secondary | ICD-10-CM

## 2011-12-24 NOTE — Assessment & Plan Note (Signed)
Blood pressure controlled. Continue present medications. 

## 2011-12-24 NOTE — Assessment & Plan Note (Signed)
Patient counseled on discontinuing. 

## 2011-12-24 NOTE — Patient Instructions (Signed)
Your physician wants you to follow-up in: ONE YEAR WITH DR CRENSHAW You will receive a reminder letter in the mail two months in advance. If you don't receive a letter, please call our office to schedule the follow-up appointment.   Your physician has requested that you have a lexiscan myoview. For further information please visit [email or web address]. Please follow instruction sheet, as given.   

## 2011-12-24 NOTE — Assessment & Plan Note (Signed)
Symptoms atypical. Will arrange Myoview.

## 2011-12-24 NOTE — Progress Notes (Signed)
HPI: 61 year old male with past medical history of coronary disease I initially saw 12/03/10 for evaluation of chest pain. Previously resided in [place]. Patient states he had myocardial infarction in January of 2011, August 2011 in November 2011. The first 2 occurred in the setting of cocaine use. He apparently had PCI following the second event. Review of his records showed that his catheterization revealed a 70% mid LAD and a 99% stenosis in the posterior left ventricular branch of the right coronary artery. He had angioplasty of that lesion in August of 2011. Myoview 5/12 - mild inferior ischemia and EF 45. Patient continued to have chest pain and cardiac catheterization was therefore performed in June of 2012 and revealed EF 60-65%. Normal LM, LAD - proximal 30-40% stenosis, mid 60-70% stenosis, and distal 50% stenosis. Left cx - 40-50% mid stenosis and a 50% stenosis into the first OM branch. RCA mid vessel has 50% stenosis. The PDA has 40-50% stenosis. Medical therapy recommended. Note patient had thrombocytopenia during his hospitalization. Hematology felt this may be related to his history of alcohol abuse and cirrhosis. He was to followup with gastroenterology as an outpatient for hepatitis C. Since he was last seen in July of 2012, he has some dyspnea on exertion but no orthopnea, PND or pedal edema. He has had 3 separate episodes of chest pain since April. They are in his neck, chest and radiate to his ear. It is described as a tightness. No associated symptoms. Resolve with nitroglycerin. Symptoms are not exertional.   Current Outpatient Prescriptions  Medication Sig Dispense Refill  . amLODipine (NORVASC) 10 MG tablet Take 10 mg by mouth daily.        Marland Kitchen aspirin 81 MG tablet Take 81 mg by mouth daily.        . carvedilol (COREG) 12.5 MG tablet Take 12.5 mg by mouth 2 (two) times daily with a meal.        . folic acid (FOLVITE) 1 MG tablet Take 1 mg by mouth daily.        Marland Kitchen  HYDROcodone-acetaminophen (NORCO) 5-325 MG per tablet Take 1 tablet by mouth 2 (two) times daily as needed. pain      . lisinopril (PRINIVIL,ZESTRIL) 20 MG tablet Take 20 mg by mouth daily.        . Multiple Vitamins-Minerals (MULTIVITAMIN WITH MINERALS) tablet Take 1 tablet by mouth daily.      . nitroGLYCERIN (NITROSTAT) 0.4 MG SL tablet Place 0.4 mg under the tongue every 5 (five) minutes as needed. Up to 3 doses. If pain persist then call 911       . omeprazole (PRILOSEC) 20 MG capsule Take 1 capsule (20 mg total) by mouth daily.  30 capsule  11     Past Medical History  Diagnosis Date  . MI (myocardial infarction)     x3, jan, aug, nov 2011; treated at [facility] in [place]  . Hepatitis C   . Sexual dysfunction   . Arthritis   . HTN (hypertension)   . Hyperlipidemia   . Chronic chest pain   . CAD (coronary artery disease)   . Thrombocytopenia   . Cataract     Past Surgical History  Procedure Date  . Hernia repair     very young  . Balloon angioplasty, artery 03/2010    History   Social History  . Marital Status: Single    Spouse Name: N/A    Number of Children: 3  . Years of Education: N/A  Occupational History  .      Unemployed   Social History Main Topics  . Smoking status: Current Everyday Smoker -- 0.3 packs/day for 45 years    Types: Cigarettes  . Smokeless tobacco: Never Used  . Alcohol Use: Yes     Occasional hasn't had any since 1st of January 2013  . Drug Use: No     nothing since 2011; previous heroin, cocaine and Marijuana  . Sexually Active: Not on file   Other Topics Concern  . Not on file   Social History Narrative  . No narrative on file    ROS: no fevers or chills, productive cough, hemoptysis, dysphasia, odynophagia, melena, hematochezia, dysuria, hematuria, rash, seizure activity, orthopnea, PND, pedal edema, claudication. Remaining systems are negative.  Physical Exam: Well-developed well-nourished in no acute distress.  Skin is warm  and dry.  HEENT is normal.  Neck is supple.  Chest is clear to auscultation with normal expansion.  Cardiovascular exam is regular rate and rhythm.  Abdominal exam nontender or distended. No masses palpated. Extremities show no edema. neuro grossly intact  ECG sinus rhythm at a rate of 64. Early repolarization.

## 2011-12-24 NOTE — Assessment & Plan Note (Signed)
Continue aspirin. Not on statin secondary to history of hepatitis and cirrhosis.

## 2011-12-24 NOTE — Assessment & Plan Note (Signed)
Continue diet.

## 2011-12-25 ENCOUNTER — Ambulatory Visit (INDEPENDENT_AMBULATORY_CARE_PROVIDER_SITE_OTHER): Payer: Medicaid Other | Admitting: Gastroenterology

## 2011-12-25 DIAGNOSIS — B182 Chronic viral hepatitis C: Secondary | ICD-10-CM

## 2011-12-25 DIAGNOSIS — K7030 Alcoholic cirrhosis of liver without ascites: Secondary | ICD-10-CM

## 2012-01-01 NOTE — Progress Notes (Signed)
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   HISTORY: The patient returns today unaccompanied. I brought him back to discuss results of all his lab testing and to assess the stability of his disease, as I suspect he has cirrhosis. In addition, I wanted Dr. Ludwig Clarks thoughts as to his ability to tolerate the inevitable medication-induced anemia in view of his history of coronary artery disease. Dr. Jens Som communicated with me via EPIC on 10/10/2011 that the patient's cardiac status is such that he could be treated for his hepatitis C.   Today, the patient no symptoms referable to his history of hepatitis C. There are no symptoms to suggest cryoglobulin mediated or decompensated liver disease. I note, however, that on 12/24/2011 he was seen by Dr. Jens Som whose note appears in EPIC, and I reviewed. The patient reports 3 episodes of chest pain in April and May 2013. They would occur at rest and resolve with 1 tablet of nitroglycerin each time. The patient reports that Dr. Jens Som is ordering a Myoview.   PAST MEDICAL HISTORY: There has been no other interval change. The patient underwent an EGD to screen for varices and was found to have short segment Barrett's with a recall EGD in 11/2014. He was started on omeprazole. He also underwent a colonoscopy, which showed 5 adenomas, the largest being 2.5 cm, a rectal tubulovillous adenoma.  Repeat colonoscopy is timed for September or October 2013.  CURRENT MEDICATIONS:  1. Amlodipine 10 mg daily.  2. Aspirin 81 mg daily.  3. Coreg 12.5 mg b.i.d.  4. Folic acid 1 mg daily.  5. Hydrocodone/acetaminophen 5/325 mg b.i.d. p.r.n.  6. Lisinopril 20 mg daily.  7. Multivitamin  daily.  8. Nitroglycerin 0.4 mg every 5 minutes, up to 3 doses p.r.n. 9. Omeprazole 20 mg daily, added by Dr. Leone Payor.  allergies:  Denies.  habits:  smoking: He continues to smoke approximately 1 pack of cigarettes per week or 3-4 cigarettes per day. ALCOHOL:  His last drink of alcohol was the week before 11/15/2011, when he had 2 shots, but he has not had any alcohol since.   REVIEW OF SYSTEMS: All 10 systems reviewed today with the patient and they are negative other than which was mentioned above. His CES-D was 9.   PHYSICAL EXAMINATION: Constitutional: Stated age without significant peripheral wasting. Vital signs: Height was 69 inches, weight 184 pounds, blood pressure 134/93, pulse 72, temperature 97.7 Fahrenheit.   LABORATORIES: Most recent liver imaging was CT of abdomen and pelvis with contrast on 01/31/2011, at which point the liver appeared unremarkable. There was no mention of splenomegaly, and there was no ascites.   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   I was ready to treat him for his hepatitis C. However, with the development of this chest pain and the background of cardiac history, I would need to wait for the Myoview study to be done and negative before I would consider starting him on therapy, particularly because of the significant anemia one can in counter on ribavirin.   In terms of his cirrhosis care, he  has been screened for varices by endoscopy on 11/11/2011, which showed short segment Barrett's and portal gastropathy only, but no varices. As long as he remains on Coreg and has never bled, he would not need to be rescreened for varices. There is no encephalopathy or ascites to treat.   In terms of HCC screening, I will order an ultrasound today, with him having last been screened on 01/31/2011. He is hepatitis A immune. His hepatitis B core  antibody was positive, and his surface antibody was negative, and I would consider him needing vaccination.   In my discussion today with the patient, we discussed his previous lab results. I have discussed the possibility of starting him on therapy for his genotype 1b hepatitis C, but I explained to him that with his recent chest pain this needs to be completely resolved before I could start him on therapy, because the medications could induce significant anemia, which could exacerbate underlying coronary disease. We also discussed getting the need to get surveillance ultrasounds and start his hepatitis B vaccination.   PLAN:  1. No need for further upper endoscopy if he remains on Carvedilol, but the final decision about surveillance for varices will be left up to Dr. Leone Payor.  2. Await the results of his stress testing on 01/12/2012.  3. Ultrasound now.  4. Hepatitis B number 1 now. Return in a month's time for his second.  5. Hepatitis A immune.  6. Return in 6 months' time for the third hepatitis B vaccine and his semiannual surveillance ultrasound booked.               Brooke Dare, MD   ADDENDUM Labs ordered were not done.  403 .[id number]  D:  Thu May 23 20:33:42 2013 ; T:  Fri May 24 11:15:22 2013  Job #:  [id number]

## 2012-01-02 ENCOUNTER — Encounter: Payer: Self-pay | Admitting: Internal Medicine

## 2012-01-02 ENCOUNTER — Ambulatory Visit (INDEPENDENT_AMBULATORY_CARE_PROVIDER_SITE_OTHER): Payer: Medicare Other | Admitting: Internal Medicine

## 2012-01-02 VITALS — BP 120/72 | HR 68 | Ht 69.0 in | Wt 185.0 lb

## 2012-01-02 DIAGNOSIS — B182 Chronic viral hepatitis C: Secondary | ICD-10-CM | POA: Insufficient documentation

## 2012-01-02 DIAGNOSIS — D128 Benign neoplasm of rectum: Secondary | ICD-10-CM | POA: Insufficient documentation

## 2012-01-02 DIAGNOSIS — D129 Benign neoplasm of anus and anal canal: Secondary | ICD-10-CM | POA: Insufficient documentation

## 2012-01-02 DIAGNOSIS — K2270 Barrett's esophagus without dysplasia: Secondary | ICD-10-CM

## 2012-01-02 DIAGNOSIS — D759 Disease of blood and blood-forming organs, unspecified: Secondary | ICD-10-CM | POA: Insufficient documentation

## 2012-01-02 DIAGNOSIS — F1020 Alcohol dependence, uncomplicated: Secondary | ICD-10-CM

## 2012-01-02 DIAGNOSIS — K7460 Unspecified cirrhosis of liver: Secondary | ICD-10-CM

## 2012-01-02 HISTORY — DX: Chronic viral hepatitis C: B18.2

## 2012-01-02 HISTORY — DX: Alcohol dependence, uncomplicated: F10.20

## 2012-01-02 MED ORDER — CARVEDILOL 12.5 MG PO TABS: 12.5000 mg | ORAL_TABLET | Freq: Two times a day (BID) | ORAL | Status: DC

## 2012-01-02 NOTE — Assessment & Plan Note (Signed)
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 Not clear that it was completely removed. Needs close followup with flexible sigmoidoscopy later this year.

## 2012-01-02 NOTE — Progress Notes (Signed)
  Subjective:    Patient ID: Patrick [name], male    DOB: May 30, 1951, 62 y.o.   MRN: [id number]  HPI This is a middle-aged [place] American man recently seen for screening colonoscopy as well as upper endoscopy to screen for varices. He has chronic hepatitis C with cirrhosis and alcohol use and abuse issues. He was started on omeprazole 20 mg daily because of discovery of short segment Barrett's esophagus. He had probable portal gastropathy but no esophageal varices. He has no heartburn now but is on omeprazole. He had 5 polyps, one of which was a large tubovillous adenoma the rectum that needs a closer followup later this year.  He continues to use alcohol occasionally though he is aware that this is hazardous to his liver. He is working to improve his health overall. He is continuing to see Dr. Jacqualine Mau in the hepatitis clinic, and the possibility of starting therapy for hepatitis C later this year exists. I think we're waiting for new or therapies that are potentially less toxic.  He also follows with Dr. Welton Flakes because of leukopenia and thrombocytopenia. He has an upcoming appointment with her.  He recently obtained Medicare.  Medications, allergies, past medical history, past surgical history, family history and social history are reviewed and updated in the EMR.  Review of Systems As above.    Objective:   Physical Exam Well-developed well-nourished no acute distress       Assessment & Plan:

## 2012-01-02 NOTE — Assessment & Plan Note (Signed)
He is aware of this problem in his working on that though he has not been able to abstain. We discussed the need to do this again.

## 2012-01-02 NOTE — Assessment & Plan Note (Signed)
No heartburn on the omeprazole 20 mg daily. We'll continue that. I explained the nature of this disease in the cancer risk no real, is very low. Certainly his liver problems or greater throughout to his health. Anticipate a routine repeat endoscopy in 2016, assuming his health status makes that sensible.

## 2012-01-02 NOTE — Patient Instructions (Addendum)
Avoid alcohol per Dr. Leone Payor.  We will be in touch about your polyp follow-up procedure.  If no word from [place] by Oct. 2013 please call us at [phone].   Copies to Dr. Quitman Livings, Dr. Roxy Cedar and Dr. Drue Second

## 2012-01-02 NOTE — Assessment & Plan Note (Signed)
This seems to be from his portal hypertension, I'm not sure he needs ongoing hematology followup unless there other issues. He will discuss with Dr. Welton Flakes.

## 2012-01-02 NOTE — Assessment & Plan Note (Signed)
He may have portal gastropathy, otherwise no signs of portal hypertension the upper GI tract. He is to remain on carvedilol. He probably does not need repeat variceal screening on the beta blocker but he will have ongoing followup for his Barrett's esophagus assuming his health status is appropriate for that, with next anticipated EGD in 2016, on a routine basis.

## 2012-01-06 ENCOUNTER — Telehealth: Payer: Self-pay | Admitting: *Deleted

## 2012-01-06 NOTE — Telephone Encounter (Signed)
patient called in and stated that he does not need to see the md here at the cancer dr.greesner

## 2012-01-08 ENCOUNTER — Ambulatory Visit: Payer: Medicaid Other | Admitting: Oncology

## 2012-01-08 ENCOUNTER — Other Ambulatory Visit: Payer: Medicaid Other | Admitting: Lab

## 2012-01-12 ENCOUNTER — Ambulatory Visit (HOSPITAL_COMMUNITY): Payer: Medicare Other | Attending: Cardiology | Admitting: Radiology

## 2012-01-12 VITALS — BP 99/81 | Ht 69.0 in | Wt 184.0 lb

## 2012-01-12 DIAGNOSIS — I10 Essential (primary) hypertension: Secondary | ICD-10-CM

## 2012-01-12 DIAGNOSIS — I2510 Atherosclerotic heart disease of native coronary artery without angina pectoris: Secondary | ICD-10-CM | POA: Insufficient documentation

## 2012-01-12 DIAGNOSIS — E785 Hyperlipidemia, unspecified: Secondary | ICD-10-CM | POA: Insufficient documentation

## 2012-01-12 DIAGNOSIS — R079 Chest pain, unspecified: Secondary | ICD-10-CM | POA: Insufficient documentation

## 2012-01-12 DIAGNOSIS — R0602 Shortness of breath: Secondary | ICD-10-CM

## 2012-01-12 MED ORDER — TECHNETIUM TC 99M TETROFOSMIN IV KIT
30.0000 | PACK | Freq: Once | INTRAVENOUS | Status: AC | PRN
  Administered 2012-01-12: 30 via INTRAVENOUS

## 2012-01-12 MED ORDER — REGADENOSON 0.4 MG/5ML IV SOLN
0.4000 mg | Freq: Once | INTRAVENOUS | Status: AC
  Administered 2012-01-12: 0.4 mg via INTRAVENOUS

## 2012-01-12 MED ORDER — TECHNETIUM TC 99M TETROFOSMIN IV KIT
10.0000 | PACK | Freq: Once | INTRAVENOUS | Status: AC | PRN
  Administered 2012-01-12: 10 via INTRAVENOUS

## 2012-01-12 NOTE — Progress Notes (Signed)
[facility] SITE 3 NUCLEAR MED [address] [place] [address] [phone]  Cardiology Nuclear Med Study  Patrick [name] is a 61 y.o. male     MRN : [id number]     DOB: July 27, 1951  Procedure Date: 01/12/2012  Nuclear Med Background Indication for Stress Test:  Evaluation for Ischemia and PTCA Patency History:  01/11: MIx3, 8/11 Angioplasty: RCA,5/12 MPS: First 2 MI's 2nd to Cocaine6/12 Heart Cath: EF; 60-65% N/O CAD,6/12 ECHO: EF: 55-60% mild LVH  Cardiac Risk Factors: Hypertension, Lipids and Smoker  Symptoms:  Chest Pain, DOE, Palpitations and SOB   Nuclear Pre-Procedure Caffeine/Decaff Intake:  None NPO After: 9:00pm   Lungs:  clear O2 Sat: 98% on room air. IV 0.9% NS with Angio Cath:  20g  IV Site: R Antecubital  IV Started by:  Stanton Kidney, EMT-P  Chest Size (in):  40 Cup Size: n/a  Height: 5\' 9"  (1.753 m)  Weight:  184 lb (83.462 kg)  BMI:  Body mass index is 27.17 kg/(m^2). Tech Comments:  Meds were taken as directed at 7:00am, per patient.    Nuclear Med Study 1 or 2 day study: 1 day  Stress Test Type:  Eugenie Birks  Reading MD: Marca Ancona, MD  Order Authorizing Provider:  B.Crenshaw MD  Resting Radionuclide: Technetium 56m Tetrofosmin  Resting Radionuclide Dose: 10.5 mCi   Stress Radionuclide:  Technetium 60m Tetrofosmin  Stress Radionuclide Dose: 32.9 mCi           Stress Protocol Rest HR: 60 Stress HR: 95  Rest BP: 99/81 Stress BP: 144/98  Exercise Time (min): n/a METS: n/a   Predicted Max HR: 159 bpm % Max HR: 59.75 bpm Rate Pressure Product: 21308   Dose of Adenosine (mg):  n/a Dose of Lexiscan: 0.4 mg  Dose of Atropine (mg): n/a Dose of Dobutamine: n/a mcg/kg/min (at max HR)  Stress Test Technologist: Milana Na, EMT-P  Nuclear Technologist:  Domenic Polite, CNMT     Rest Procedure:  Myocardial perfusion imaging was performed at rest 45 minutes following the intravenous administration of Technetium 45m Tetrofosmin. Rest  ECG: NSR with non-specific ST-T wave changes  Stress Procedure:  The patient received IV Lexiscan 0.4 mg over 15-seconds.  Technetium 49m Tetrofosmin injected at 30-seconds.  There were no significant changes, + sob, lt. headed and rare pvcs with Lexiscan, .  Quantitative spect images were obtained after a 45 minute delay. Stress ECG: No significant change from baseline ECG  QPS Raw Data Images:  Normal; no motion artifact; normal heart/lung ratio. Stress Images:  Normal homogeneous uptake in all areas of the myocardium. Rest Images:  Normal homogeneous uptake in all areas of the myocardium. Subtraction (SDS):  There is no evidence of scar or ischemia. Transient Ischemic Dilatation (Normal <1.22):  1.11 Lung/Heart Ratio (Normal <0.45):  0.30  Quantitative Gated Spect Images QGS EDV:  109 ml QGS ESV:  49 ml  Impression Exercise Capacity:  Lexiscan with no exercise. BP Response:  Normal blood pressure response. Clinical Symptoms:  Short of breath.  ECG Impression:  No significant ST segment change suggestive of ischemia. Comparison with Prior Nuclear Study: No images to compare  Overall Impression:  Normal stress nuclear study.  LV Ejection Fraction: 55%.  LV Wall Motion:  NL LV Function; NL Wall Motion  [employer]

## 2012-01-13 ENCOUNTER — Ambulatory Visit (HOSPITAL_COMMUNITY)
Admission: RE | Admit: 2012-01-13 | Discharge: 2012-01-13 | Disposition: A | Discharge status: Home / Self Care | Payer: Medicare Other | Source: Ambulatory Visit | Attending: Gastroenterology | Admitting: Gastroenterology

## 2012-01-13 DIAGNOSIS — K7030 Alcoholic cirrhosis of liver without ascites: Secondary | ICD-10-CM | POA: Insufficient documentation

## 2012-01-13 DIAGNOSIS — N2881 Hypertrophy of kidney: Secondary | ICD-10-CM | POA: Insufficient documentation

## 2012-01-13 DIAGNOSIS — B182 Chronic viral hepatitis C: Secondary | ICD-10-CM | POA: Insufficient documentation

## 2012-01-15 ENCOUNTER — Encounter: Payer: Self-pay | Admitting: Gastroenterology

## 2012-01-20 ENCOUNTER — Other Ambulatory Visit: Payer: Self-pay | Admitting: Cardiology

## 2012-01-20 MED ORDER — NITROGLYCERIN 0.4 MG SL SUBL: 0.4000 mg | SUBLINGUAL_TABLET | SUBLINGUAL | Status: DC | PRN

## 2012-02-02 LAB — PROTIME-INR: Prothrombin Time: 13.5 seconds (ref 11.6–15.2)

## 2012-02-03 LAB — CBC WITH DIFFERENTIAL/PLATELET
Eosinophils Absolute: 0.1 10*3/uL (ref 0.0–0.7)
Eosinophils Relative: 4 % (ref 0–5)
HCT: 41.5 % (ref 39.0–52.0)
Lymphocytes Relative: 41 % (ref 12–46)
Lymphs Abs: 1.3 10*3/uL (ref 0.7–4.0)
MCH: 31.5 pg (ref 26.0–34.0)
MCV: 92.6 fL (ref 78.0–100.0)
Monocytes Absolute: 0.5 10*3/uL (ref 0.1–1.0)
RBC: 4.48 MIL/uL (ref 4.22–5.81)
WBC: 3.2 10*3/uL — ABNORMAL LOW (ref 4.0–10.5)

## 2012-02-03 LAB — COMPLETE METABOLIC PANEL WITH GFR
AST: 59 U/L — ABNORMAL HIGH (ref 0–37)
Albumin: 4.6 g/dL (ref 3.5–5.2)
BUN: 8 mg/dL (ref 6–23)
CO2: 27 mEq/L (ref 19–32)
Calcium: 10 mg/dL (ref 8.4–10.5)
Chloride: 104 mEq/L (ref 96–112)
Creat: 1.12 mg/dL (ref 0.50–1.35)
GFR, Est African American: 82 mL/min
Potassium: 4.8 mEq/L (ref 3.5–5.3)

## 2012-02-03 LAB — HEPATITIS A ANTIBODY, TOTAL: Hep A Total Ab: POSITIVE — AB

## 2012-04-19 ENCOUNTER — Telehealth: Payer: Self-pay | Admitting: Cardiology

## 2012-04-19 NOTE — Telephone Encounter (Signed)
Walk In pt Form " Pt Dropped Off Social Security Questionnaire To be completed By Dr.Crenshsw, placed In Doc Box"  04/19/12/km

## 2012-04-26 ENCOUNTER — Telehealth: Payer: Self-pay | Admitting: *Deleted

## 2012-04-26 NOTE — Telephone Encounter (Signed)
Informed pt records sent to [employer] and it takes 7-10 days and any further questions on the paperwork can be addressed by Selena Batten in medical records. Pt agreed to plan.

## 2012-04-26 NOTE — Telephone Encounter (Signed)
F/u   Patient calling for f/u on this matter, he can be reached at [phone]

## 2012-04-26 NOTE — Telephone Encounter (Signed)
Received discharge application: total and permanent disability paperwork, taken to [facility]. Medical records.

## 2012-04-30 ENCOUNTER — Telehealth: Payer: Self-pay | Admitting: Cardiology

## 2012-04-30 NOTE — Telephone Encounter (Signed)
Spoke with Patricia/[employer] she does not Complete these papers I have given back to Debra/Crenshaw to Complete  04/30/12/KM

## 2012-04-30 NOTE — Telephone Encounter (Signed)
Spoke with pt, he had dropped off paperwork to confirm his disability. When he lived in [place] he was given disability because he was unable to return to his job due to his heart. Explained to pt that from our standpoint he is not disabled from his heart. The pt is going to check with his lawyer about the best person to feel this paperwork out and call me back next week.

## 2012-04-30 NOTE — Telephone Encounter (Signed)
F/u  Returning call back to nurse.  

## 2012-06-03 ENCOUNTER — Encounter: Payer: Self-pay | Admitting: Internal Medicine

## 2012-06-28 ENCOUNTER — Other Ambulatory Visit: Payer: Self-pay | Admitting: Ophthalmology

## 2012-06-28 NOTE — H&P (Signed)
Pre-operative History and Physical for Ophthalmic Surgery  Patrick [name] 06/28/2012                  Chief Complaint: Decreased vision right eye  Diagnosis: Combined Cataract right eye  No Known Allergies    Prior to Admission medications   Medication Sig Start Date End Date Taking? Authorizing Provider  amLODipine (NORVASC) 10 MG tablet Take 10 mg by mouth daily.      Historical Provider, MD  aspirin 81 MG tablet Take 81 mg by mouth daily.      Historical Provider, MD  carvedilol (COREG) 12.5 MG tablet Take 1 tablet (12.5 mg total) by mouth 2 (two) times daily with a meal. 01/02/12   Iva Boop, MD  folic acid (FOLVITE) 1 MG tablet Take 1 mg by mouth daily.      Historical Provider, MD  HYDROcodone-acetaminophen (NORCO) 5-325 MG per tablet Take 1 tablet by mouth 2 (two) times daily as needed. pain    Historical Provider, MD  lisinopril (PRINIVIL,ZESTRIL) 20 MG tablet Take 20 mg by mouth daily.      Historical Provider, MD  Multiple Vitamins-Minerals (MULTIVITAMIN WITH MINERALS) tablet Take 1 tablet by mouth daily.    Historical Provider, MD  nitroGLYCERIN (NITROSTAT) 0.4 MG SL tablet Place 1 tablet (0.4 mg total) under the tongue every 5 (five) minutes as needed. Up to 3 doses. If pain persist then call 911 01/20/12   Lewayne Bunting, MD  omeprazole (PRILOSEC) 20 MG capsule Take 1 capsule (20 mg total) by mouth daily. 11/18/11 11/17/12  Iva Boop, MD    Planned Procedure:                                       Phacoemulsification, Posterior Chamber Intra-ocular Lens Right Eye There were no vitals filed for this visit.  Pulse: 70         Temp: NE        Resp:  18    ROS: noncontributory  Past Medical History  Diagnosis Date  . MI (myocardial infarction)     x3, jan, aug, nov 2011; treated at [facility] in [place]  . Sexual dysfunction   . Arthritis   . HTN (hypertension)   . Hyperlipidemia   . Chronic chest pain   . CAD (coronary artery disease)   . Thrombocytopenia   .  Cataract   . SSBE (short-segment Barrett's esophagus) 11/11/11  . Colon adenomas 11/11/11    x 5, one rectal tv adenoma  . GERD (gastroesophageal reflux disease)   . 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 Barrett's esophagus 11/17/2011    Short-segment dx 11/2011   . Alcoholism 01/02/2012    Past Surgical History  Procedure Date  . Hernia repair     very young  . Balloon angioplasty, artery 03/2010  . Colonoscopy 11/11/11    Dr. Stan Head  . Esophagogastroduodenoscopy 11/11/11    Dr. Stan Head     History   Social History  . Marital Status: Single    Spouse Name: N/A    Number of Children: 3  . Years of Education: N/A   Occupational History  .      Unemployed   Social History Main Topics  . Smoking status: Current Every Day Smoker -- 0.3 packs/day for 45 years    Types: Cigarettes  . Smokeless tobacco: Never Used  .  Alcohol Use: Yes     Comment: Occasional hasn't had any since 1st of January 2013  . Drug Use: No     Comment: nothing since 2011; previous heroin, cocaine and Marijuana  . Sexually Active: Not on file   Other Topics Concern  . Not on file   Social History Narrative  . No narrative on file     The following examination is for anesthesia clearance for minimally invasive Ophthalmic surgery. It is primarily to document heart and lung findings and is not intended to elucidate unknown general medical conditions inclusive of abdominal masses, lung lesions, etc.   General Constitution:  within normal limits   Alertness/Orientation:  Person, time place     yes   HEENT:  Eye Findings:  Combined Cataract                   right eye  Neck: supple without masses  Chest/Lungs: clear to auscultation  Cardiac: Normal S1 and S2 without Murmur, S3 or S4  Neuro: non-focal  Impression: Combined Cataract Right Eye  Planned Procedure:  Phacoemulsification, Posterior Chamber Intraocular Lens OD    [name], MD

## 2012-08-11 ENCOUNTER — Other Ambulatory Visit (HOSPITAL_COMMUNITY): Payer: Medicare Other

## 2012-08-16 ENCOUNTER — Encounter (HOSPITAL_COMMUNITY): Payer: Self-pay

## 2012-08-16 ENCOUNTER — Encounter (HOSPITAL_COMMUNITY)
Admission: RE | Admit: 2012-08-16 | Discharge: 2012-08-16 | Disposition: A | Discharge status: Home / Self Care | Payer: Medicare Other | Source: Ambulatory Visit | Attending: Ophthalmology | Admitting: Ophthalmology

## 2012-08-16 ENCOUNTER — Ambulatory Visit (HOSPITAL_COMMUNITY)
Admission: RE | Admit: 2012-08-16 | Discharge: 2012-08-16 | Disposition: A | Discharge status: Home / Self Care | Payer: Medicare Other | Source: Ambulatory Visit | Attending: Anesthesiology | Admitting: Anesthesiology

## 2012-08-16 DIAGNOSIS — Z79899 Other long term (current) drug therapy: Secondary | ICD-10-CM | POA: Insufficient documentation

## 2012-08-16 DIAGNOSIS — E785 Hyperlipidemia, unspecified: Secondary | ICD-10-CM | POA: Insufficient documentation

## 2012-08-16 DIAGNOSIS — H269 Unspecified cataract: Secondary | ICD-10-CM | POA: Insufficient documentation

## 2012-08-16 DIAGNOSIS — I10 Essential (primary) hypertension: Secondary | ICD-10-CM | POA: Insufficient documentation

## 2012-08-16 DIAGNOSIS — Z01812 Encounter for preprocedural laboratory examination: Secondary | ICD-10-CM | POA: Insufficient documentation

## 2012-08-16 LAB — CBC
MCH: 31.4 pg (ref 26.0–34.0)
MCHC: 34.6 g/dL (ref 30.0–36.0)
MCV: 90.8 fL (ref 78.0–100.0)
Platelets: 99 10*3/uL — ABNORMAL LOW (ref 150–400)
RBC: 4.04 MIL/uL — ABNORMAL LOW (ref 4.22–5.81)
RDW: 15.5 % (ref 11.5–15.5)

## 2012-08-16 LAB — BASIC METABOLIC PANEL
CO2: 28 mEq/L (ref 19–32)
Calcium: 9.6 mg/dL (ref 8.4–10.5)
Creatinine, Ser: 1.09 mg/dL (ref 0.50–1.35)
GFR calc non Af Amer: 71 mL/min — ABNORMAL LOW (ref >90)
Sodium: 140 mEq/L (ref 135–145)

## 2012-08-16 NOTE — Pre-Procedure Instructions (Signed)
Patrick [name]  08/16/2012   Your procedure is scheduled on:  Wednesday, 08/18/2012@8 :30AM.  Report to [facility] at 6:30 AM.  Call this number if you have problems the morning of surgery: [phone]   Remember:   Do not eat food or drink liquids after midnight.   Take these medicines the morning of surgery with A SIP OF WATER: Carvedilol, Amlodipine, Nitroglycerin(if needed), Norco(if needed), & Prilosec.   Do not wear jewelry, make-up or nail polish.  Do not wear lotions, powders, or perfumes. You may wear deodorant.  Do not shave 48 hours prior to surgery. Men may shave face and neck.  Do not bring valuables to the hospital.  Contacts, dentures or bridgework may not be worn into surgery.  Leave suitcase in the car. After surgery it may be brought to your room.  For patients admitted to the hospital, checkout time is 11:00 AM the day of discharge.   Patients discharged the day of surgery will not be allowed to drive home.  Name and phone number of your driver: Stevenson Clinch, mother's husband  Special Instructions: Shower using CHG 2 nights before surgery and the night before surgery.  If you shower the day of surgery use CHG.  Use special wash - you have one bottle of CHG for all showers.  You should use approximately 1/3 of the bottle for each shower.   Please read over the following fact sheets that you were given: Pain Booklet, Coughing and Deep Breathing and Surgical Site Infection Prevention

## 2012-08-16 NOTE — Progress Notes (Signed)
08/16/12 1533  OBSTRUCTIVE SLEEP APNEA  Have you ever been diagnosed with sleep apnea through a sleep study? No  Do you snore loudly (loud enough to be heard through closed doors)?  0  Do you often feel tired, fatigued, or sleepy during the daytime? 1  Has anyone observed you stop breathing during your sleep? 0  Do you have, or are you being treated for high blood pressure? 1  BMI more than 35 kg/m2? 0  Age over 62 years old? 1  Neck circumference greater than 40 cm/18 inches? 0  Gender: 1  Obstructive Sleep Apnea Score 4   Score 4 or greater  Results sent to PCP

## 2012-08-16 NOTE — Progress Notes (Signed)
Pt here for PAT appt.  Cardiologist: Dr. Jens Som, [place] Last Heart Cath: 01/31/2011( In Epic & copy placed in physical chart). Last ECHO:01/30/2011( In EPIC) Last Stress: 01/12/2012( In EPIC) Denies having Sleep apnea but Stop-Bang questionaire=4- note routed to surgeon.  Reports having bronchitis after christmas w/ CXR done at [facility], [place], [place].  Pt c/o productive cough-clear.  Chest xray obtained per anesthesia guidelines.

## 2012-08-16 NOTE — Progress Notes (Signed)
Abnormal labs noted: platelets=99.  Chart given to A. Zelenak, PA, to review.

## 2012-08-17 ENCOUNTER — Encounter: Payer: Self-pay | Admitting: Internal Medicine

## 2012-08-17 LAB — HEPATIC FUNCTION PANEL
Albumin: 3.7 g/dL (ref 3.5–5.2)
Alkaline Phosphatase: 71 U/L (ref 39–117)
Total Protein: 8.1 g/dL (ref 6.0–8.3)

## 2012-08-17 NOTE — Progress Notes (Signed)
Letter will be sent advising follow-up

## 2012-08-17 NOTE — Consult Note (Signed)
Anesthesia chart review: Patient is 62 year old male scheduled for right eye cataract extraction by Dr. Clarisa Kindred on 08/18/2012.  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  By history, he denied illicit drug use since 08/2011.  Cardiologist is Dr. Jens Som, last visit on 12/24/11.  EKG then showed NSR, early repolarization.  Nuclear stress test on 01/12/12 showed: Normal stress nuclear study. LV Ejection Fraction: 55%. LV Wall Motion: NL LV Function; NL Wall Motion.  Cardiac cath on 01/31/11 showed  1. Moderate diffuse coronary artery disease with ectasia of the right coronary artery (mRCA 50%, PDA 40-50%), diffuse stenosis of the LAD (pLAD 30-40%, mLAD 60-70%, dLAD 50%) and left circumflex (mCX 40-50%, 50% OM1).  2. Normal LV function. EF 60-65%. RECOMMENDATIONS: With no critical lesions and moderate diffuse disease, I would recommend continuing with medical management. I would recommend discontinuing Plavix as this patient has significant thrombocytopenia with platelet count less than 100,000 (Dr. Tonny Bollman).    Echo on 01/30/11 showed mild LVH, normal LV systolic function, EF 55-60%. Wall motion normal with no regional wall motion abnormalities. Doppler findings with grade 1 diastolic dysfunction. Trivial aortic regurgitation.  CXR on 08/16/12 showed no acute findings.  Gastroenterologist is Dr. Leone Payor. EGD on 11/11/11 showed Barrett's, possible in the distal esophagus, abnormal mucosa in the body and antrum (? Portal gastropathy), otherwise normal (no esophageal varices).  Follow-up EGD no recommended until 2016.  Abdominal ultrasound on 01/13/12 showed: No acute abdominal pathology was demonstrated.  Liver is normal size with heterogeneous appearance of parenchymal echotexture without focal  parenchymal mass evident. History given of cirrhosis. Question slight surface nodularity. No ascites or splenomegaly is evident. Portal vein is patent with hepatopetal flow. Atrophic right kidney containing calcifications most likely reflecting calcified renal cysts unchanged from prior CT. No right hydronephrosis evident.  Compensatory hypertrophy of left kidney.  CT of the abdomen/pelvis on 01/31/11 showed: No focal parenchymal abnormality demonstrated in the liver or  spleen. No significant abdominal, pelvic, or retroperitoneal  lymphadenopathy. Atrophy and non functioning right kidney.  Preoperative labs noted.  AST/ALT elevated at 132/176, previously 59/65 on 02/02/12, but up to 188/205 in June 2012. (HFP results routed to Dr. Leone Payor.) WBC 2.9.  PLT 99. He has known thrombocytopenia and leukopenia felt related to his cirrhosis and alcoholism.    Patient has known chronic Hepatitis C history with recurrent > 2X normal elevation of AST/ALT.  His PLT count is overall stable.  He has had both GI and cardiology follow-up within the past year.  I reviewed above with Anesthesiologist Dr. Noreene Larsson who agrees with no plan for additional preoperative labs for this procedure.  Patient will be evaluated by his assigned anesthesiologist on the day of surgery, but if no acute findings then would anticipate he can proceed as planned.  Shonna Chock, PA-C 08/18/11 1656

## 2012-08-18 ENCOUNTER — Encounter (HOSPITAL_COMMUNITY): Payer: Self-pay | Admitting: Vascular Surgery

## 2012-08-18 ENCOUNTER — Encounter (HOSPITAL_COMMUNITY): Payer: Self-pay | Admitting: Surgery

## 2012-08-18 ENCOUNTER — Encounter (HOSPITAL_COMMUNITY)
Admission: RE | Disposition: A | Discharge status: Home / Self Care | Payer: Self-pay | Source: Ambulatory Visit | Attending: Ophthalmology

## 2012-08-18 ENCOUNTER — Ambulatory Visit (HOSPITAL_COMMUNITY): Payer: Medicare Other | Admitting: Vascular Surgery

## 2012-08-18 ENCOUNTER — Ambulatory Visit (HOSPITAL_COMMUNITY)
Admission: RE | Admit: 2012-08-18 | Discharge: 2012-08-18 | Disposition: A | Discharge status: Home / Self Care | Payer: Medicare Other | Source: Ambulatory Visit | Attending: Ophthalmology | Admitting: Ophthalmology

## 2012-08-18 DIAGNOSIS — K219 Gastro-esophageal reflux disease without esophagitis: Secondary | ICD-10-CM | POA: Insufficient documentation

## 2012-08-18 DIAGNOSIS — I10 Essential (primary) hypertension: Secondary | ICD-10-CM | POA: Insufficient documentation

## 2012-08-18 DIAGNOSIS — I2510 Atherosclerotic heart disease of native coronary artery without angina pectoris: Secondary | ICD-10-CM | POA: Insufficient documentation

## 2012-08-18 DIAGNOSIS — I252 Old myocardial infarction: Secondary | ICD-10-CM | POA: Insufficient documentation

## 2012-08-18 DIAGNOSIS — Z01812 Encounter for preprocedural laboratory examination: Secondary | ICD-10-CM | POA: Insufficient documentation

## 2012-08-18 DIAGNOSIS — H2510 Age-related nuclear cataract, unspecified eye: Secondary | ICD-10-CM | POA: Insufficient documentation

## 2012-08-18 HISTORY — PX: CATARACT EXTRACTION W/PHACO: SHX586

## 2012-08-18 SURGERY — PHACOEMULSIFICATION, CATARACT, WITH IOL INSERTION
Anesthesia: General | Site: Eye | Laterality: Right | Wound class: Clean

## 2012-08-18 MED ORDER — BUPIVACAINE HCL (PF) 0.75 % IJ SOLN
INTRAMUSCULAR | Status: DC | PRN
  Administered 2012-08-18: 10 mL

## 2012-08-18 MED ORDER — CEFAZOLIN SUBCONJUNCTIVAL INJECTION 100 MG/0.5 ML
200.0000 mg | INJECTION | Freq: Once | SUBCONJUNCTIVAL | Status: DC
  Filled 2012-08-18: qty 1

## 2012-08-18 MED ORDER — HYDROMORPHONE HCL PF 1 MG/ML IJ SOLN
0.2500 mg | INTRAMUSCULAR | Status: DC | PRN
  Administered 2012-08-18 (×2): 0.5 mg via INTRAVENOUS

## 2012-08-18 MED ORDER — MIDAZOLAM HCL 5 MG/5ML IJ SOLN
INTRAMUSCULAR | Status: DC | PRN
  Administered 2012-08-18: 1 mg via INTRAVENOUS

## 2012-08-18 MED ORDER — LIDOCAINE HCL 4 % MT SOLN
OROMUCOSAL | Status: DC | PRN
  Administered 2012-08-18: 4 mL via TOPICAL

## 2012-08-18 MED ORDER — HYPROMELLOSE (GONIOSCOPIC) 2.5 % OP SOLN
OPHTHALMIC | Status: DC | PRN
  Administered 2012-08-18: 2 [drp] via OPHTHALMIC

## 2012-08-18 MED ORDER — EPINEPHRINE HCL 1 MG/ML IJ SOLN
INTRAOCULAR | Status: DC | PRN
  Administered 2012-08-18: 09:00:00

## 2012-08-18 MED ORDER — NA CHONDROIT SULF-NA HYALURON 40-30 MG/ML IO SOLN
INTRAOCULAR | Status: AC
  Filled 2012-08-18: qty 0.5

## 2012-08-18 MED ORDER — PHENYLEPHRINE HCL 2.5 % OP SOLN
OPHTHALMIC | Status: AC
  Administered 2012-08-18: 1 [drp] via OPHTHALMIC
  Filled 2012-08-18: qty 3

## 2012-08-18 MED ORDER — PROPOFOL 10 MG/ML IV BOLUS
INTRAVENOUS | Status: DC | PRN
  Administered 2012-08-18: 140 mg via INTRAVENOUS

## 2012-08-18 MED ORDER — ARTIFICIAL TEARS OP OINT
TOPICAL_OINTMENT | OPHTHALMIC | Status: DC | PRN
  Administered 2012-08-18: 1 via OPHTHALMIC

## 2012-08-18 MED ORDER — NA CHONDROIT SULF-NA HYALURON 40-30 MG/ML IO SOLN
INTRAOCULAR | Status: DC | PRN
  Administered 2012-08-18: 0.5 mL via INTRAOCULAR

## 2012-08-18 MED ORDER — BACITRACIN-POLYMYXIN B 500-10000 UNIT/GM OP OINT
TOPICAL_OINTMENT | OPHTHALMIC | Status: AC
  Filled 2012-08-18: qty 3.5

## 2012-08-18 MED ORDER — ONDANSETRON HCL 4 MG/2ML IJ SOLN: 4.0000 mg | Freq: Once | INTRAMUSCULAR | Status: DC | PRN

## 2012-08-18 MED ORDER — ROCURONIUM BROMIDE 100 MG/10ML IV SOLN
INTRAVENOUS | Status: DC | PRN
  Administered 2012-08-18: 40 mg via INTRAVENOUS

## 2012-08-18 MED ORDER — WATER FOR IRRIGATION, STERILE IR SOLN
Status: DC | PRN
  Administered 2012-08-18: 1000 mL

## 2012-08-18 MED ORDER — LIDOCAINE HCL (CARDIAC) 20 MG/ML IV SOLN
INTRAVENOUS | Status: DC | PRN
  Administered 2012-08-18: 30 mg via INTRAVENOUS

## 2012-08-18 MED ORDER — PHENYLEPHRINE HCL 2.5 % OP SOLN
1.0000 [drp] | OPHTHALMIC | Status: AC | PRN
  Administered 2012-08-18 (×3): 1 [drp] via OPHTHALMIC

## 2012-08-18 MED ORDER — HYDROMORPHONE HCL PF 1 MG/ML IJ SOLN
INTRAMUSCULAR | Status: AC
  Filled 2012-08-18: qty 1

## 2012-08-18 MED ORDER — PREDNISOLONE ACETATE 1 % OP SUSP
OPHTHALMIC | Status: AC
  Filled 2012-08-18: qty 5

## 2012-08-18 MED ORDER — 0.9 % SODIUM CHLORIDE (POUR BTL) OPTIME
TOPICAL | Status: DC | PRN
  Administered 2012-08-18: 1000 mL

## 2012-08-18 MED ORDER — FENTANYL CITRATE 0.05 MG/ML IJ SOLN
INTRAMUSCULAR | Status: DC | PRN
  Administered 2012-08-18 (×2): 50 ug via INTRAVENOUS

## 2012-08-18 MED ORDER — BACITRACIN-POLYMYXIN B 500-10000 UNIT/GM OP OINT
TOPICAL_OINTMENT | OPHTHALMIC | Status: DC | PRN
  Administered 2012-08-18: 1 via OPHTHALMIC

## 2012-08-18 MED ORDER — EPHEDRINE SULFATE 50 MG/ML IJ SOLN
INTRAMUSCULAR | Status: DC | PRN
  Administered 2012-08-18: 10 mg via INTRAVENOUS
  Administered 2012-08-18 (×2): 5 mg via INTRAVENOUS

## 2012-08-18 MED ORDER — SODIUM HYALURONATE 10 MG/ML IO SOLN
INTRAOCULAR | Status: AC
  Filled 2012-08-18: qty 0.85

## 2012-08-18 MED ORDER — LIDOCAINE HCL 2 % IJ SOLN
INTRAMUSCULAR | Status: AC
  Filled 2012-08-18: qty 20

## 2012-08-18 MED ORDER — GLYCOPYRROLATE 0.2 MG/ML IJ SOLN
INTRAMUSCULAR | Status: DC | PRN
  Administered 2012-08-18: 0.6 mg via INTRAVENOUS

## 2012-08-18 MED ORDER — PROVISC 10 MG/ML IO SOLN
INTRAOCULAR | Status: DC | PRN
  Administered 2012-08-18: .85 mL via INTRAOCULAR

## 2012-08-18 MED ORDER — LACTATED RINGERS IV SOLN
INTRAVENOUS | Status: DC | PRN
  Administered 2012-08-18 (×2): via INTRAVENOUS

## 2012-08-18 MED ORDER — TETRACAINE HCL 0.5 % OP SOLN
2.0000 [drp] | OPHTHALMIC | Status: AC
  Administered 2012-08-18: 2 [drp] via OPHTHALMIC

## 2012-08-18 MED ORDER — PREDNISOLONE ACETATE 1 % OP SUSP
1.0000 [drp] | OPHTHALMIC | Status: AC
  Administered 2012-08-18: 1 [drp] via OPHTHALMIC

## 2012-08-18 MED ORDER — NEOSTIGMINE METHYLSULFATE 1 MG/ML IJ SOLN
INTRAMUSCULAR | Status: DC | PRN
  Administered 2012-08-18: 4 mg via INTRAVENOUS

## 2012-08-18 MED ORDER — EPINEPHRINE HCL 1 MG/ML IJ SOLN
INTRAMUSCULAR | Status: AC
  Filled 2012-08-18: qty 1

## 2012-08-18 MED ORDER — GATIFLOXACIN 0.5 % OP SOLN
OPHTHALMIC | Status: AC
  Administered 2012-08-18 (×3): 1 [drp] via OPHTHALMIC
  Filled 2012-08-18: qty 2.5

## 2012-08-18 MED ORDER — ACETAZOLAMIDE SODIUM 500 MG IJ SOLR
INTRAMUSCULAR | Status: AC
  Filled 2012-08-18: qty 500

## 2012-08-18 MED ORDER — ONDANSETRON HCL 4 MG/2ML IJ SOLN
INTRAMUSCULAR | Status: DC | PRN
  Administered 2012-08-18: 4 mg via INTRAVENOUS

## 2012-08-18 MED ORDER — PHENYLEPHRINE HCL 10 MG/ML IJ SOLN
INTRAMUSCULAR | Status: DC | PRN
  Administered 2012-08-18 (×2): 80 ug via INTRAVENOUS
  Administered 2012-08-18: 40 ug via INTRAVENOUS
  Administered 2012-08-18 (×2): 80 ug via INTRAVENOUS

## 2012-08-18 MED ORDER — BSS IO SOLN
INTRAOCULAR | Status: AC
  Filled 2012-08-18: qty 500

## 2012-08-18 MED ORDER — CEFAZOLIN SUBCONJUNCTIVAL INJECTION 100 MG/0.5 ML
INJECTION | SUBCONJUNCTIVAL | Status: DC | PRN
  Administered 2012-08-18: 100 mg via SUBCONJUNCTIVAL

## 2012-08-18 MED ORDER — GATIFLOXACIN 0.5 % OP SOLN: 1.0000 [drp] | OPHTHALMIC | Status: DC | PRN

## 2012-08-18 MED ORDER — BUPIVACAINE HCL (PF) 0.75 % IJ SOLN
INTRAMUSCULAR | Status: AC
  Filled 2012-08-18: qty 10

## 2012-08-18 MED ORDER — DEXAMETHASONE SODIUM PHOSPHATE 10 MG/ML IJ SOLN
INTRAMUSCULAR | Status: DC | PRN
  Administered 2012-08-18: 10 mg

## 2012-08-18 MED ORDER — HYPROMELLOSE (GONIOSCOPIC) 2.5 % OP SOLN
OPHTHALMIC | Status: AC
  Filled 2012-08-18: qty 15

## 2012-08-18 MED ORDER — DEXAMETHASONE SODIUM PHOSPHATE 10 MG/ML IJ SOLN
INTRAMUSCULAR | Status: AC
  Filled 2012-08-18: qty 1

## 2012-08-18 MED ORDER — TETRACAINE HCL 0.5 % OP SOLN
OPHTHALMIC | Status: AC
  Administered 2012-08-18: 2 [drp] via OPHTHALMIC
  Filled 2012-08-18: qty 2

## 2012-08-18 SURGICAL SUPPLY — 37 items
APPLICATOR COTTON TIP 6IN STRL (MISCELLANEOUS) ×2 IMPLANT
APPLICATOR DR MATTHEWS STRL (MISCELLANEOUS) ×2 IMPLANT
BAG MINI COLL DRAIN (WOUND CARE) ×2 IMPLANT
BLADE KERATOME 2.75 (BLADE) ×2 IMPLANT
CLOTH BEACON ORANGE TIMEOUT ST (SAFETY) ×2 IMPLANT
DRAPE OPHTHALMIC 77X100 STRL (CUSTOM PROCEDURE TRAY) ×2 IMPLANT
DRAPE POUCH INSTRU U-SHP 10X18 (DRAPES) ×2 IMPLANT
DRSG TEGADERM 4X4.75 (GAUZE/BANDAGES/DRESSINGS) ×2 IMPLANT
GLOVE SS BIOGEL STRL SZ 6.5 (GLOVE) ×1 IMPLANT
GLOVE SUPERSENSE BIOGEL SZ 6.5 (GLOVE) ×1
GOWN SRG XL XLNG 56XLVL 4 (GOWN DISPOSABLE) ×1 IMPLANT
GOWN STRL NON-REIN LRG LVL3 (GOWN DISPOSABLE) ×2 IMPLANT
GOWN STRL NON-REIN XL XLG LVL4 (GOWN DISPOSABLE) ×1
KIT BASIN OR (CUSTOM PROCEDURE TRAY) ×2 IMPLANT
KNIFE GRIESHABER SHARP 2.5MM (MISCELLANEOUS) ×2 IMPLANT
LENS IOL ACRYSOF MP POST 18.5 (Intraocular Lens) ×2 IMPLANT
MASK EYE SHIELD (GAUZE/BANDAGES/DRESSINGS) ×2 IMPLANT
NEEDLE 18GX1X1/2 (RX/OR ONLY) (NEEDLE) ×2 IMPLANT
NEEDLE 22X1 1/2 (OR ONLY) (NEEDLE) ×2 IMPLANT
NEEDLE 25GX 5/8IN NON SAFETY (NEEDLE) ×2 IMPLANT
NEEDLE FILTER BLUNT 18X 1/2SAF (NEEDLE) ×1
NEEDLE FILTER BLUNT 18X1 1/2 (NEEDLE) ×1 IMPLANT
NEEDLE HYPO 30X.5 LL (NEEDLE) ×4 IMPLANT
NS IRRIG 1000ML POUR BTL (IV SOLUTION) ×2 IMPLANT
PACK CATARACT CUSTOM (CUSTOM PROCEDURE TRAY) ×2 IMPLANT
PACK CATARACT MCHSCP (PACKS) ×2 IMPLANT
PAD ARMBOARD 7.5X6 YLW CONV (MISCELLANEOUS) ×4 IMPLANT
PAD EYE OVAL STERILE LF (GAUZE/BANDAGES/DRESSINGS) ×2 IMPLANT
SHUTTLE MONARCH TYPE A (NEEDLE) ×2 IMPLANT
SOLUTION ANTI FOG 6CC (MISCELLANEOUS) ×2 IMPLANT
SPEAR EYE SURG WECK-CEL (MISCELLANEOUS) ×2 IMPLANT
SYR TB 1ML LUER SLIP (SYRINGE) ×2 IMPLANT
TAPE PAPER MEDFIX 1IN X 10YD (GAUZE/BANDAGES/DRESSINGS) ×2 IMPLANT
TIP ABS 45DEG FLARED 0.9MM (TIP) ×2 IMPLANT
TOWEL OR 17X24 6PK STRL BLUE (TOWEL DISPOSABLE) ×4 IMPLANT
WATER STERILE IRR 1000ML POUR (IV SOLUTION) ×2 IMPLANT
WIPE INSTRUMENT VISIWIPE 73X73 (MISCELLANEOUS) ×2 IMPLANT

## 2012-08-18 NOTE — Anesthesia Preprocedure Evaluation (Addendum)
Anesthesia Evaluation  Patient identified by MRN, date of birth, ID band Patient awake    Reviewed: Allergy & Precautions, H&P , NPO status , Patient's Chart, lab work & pertinent test results  Airway Mallampati: II TM Distance: >3 FB Neck ROM: Full    Dental  (+) Edentulous Upper, Partial Lower and Dental Advisory Given   Pulmonary  breath sounds clear to auscultation        Cardiovascular hypertension, + CAD and + Past MI Rhythm:Regular Rate:Normal     Neuro/Psych PSYCHIATRIC DISORDERS    GI/Hepatic GERD-  ,(+) Hepatitis -, C  Endo/Other    Renal/GU      Musculoskeletal   Abdominal (+) + obese,   Peds  Hematology   Anesthesia Other Findings   Reproductive/Obstetrics                          Anesthesia Physical Anesthesia Plan  ASA: III  Anesthesia Plan: General   Post-op Pain Management:    Induction: Intravenous  Airway Management Planned: Oral ETT  Additional Equipment:   Intra-op Plan:   Post-operative Plan: Extubation in OR  Informed Consent: I have reviewed the patients History and Physical, chart, labs and discussed the procedure including the risks, benefits and alternatives for the proposed anesthesia with the patient or authorized representative who has indicated his/her understanding and acceptance.   Dental advisory given  Plan Discussed with: CRNA and Surgeon  Anesthesia Plan Comments: (Cataract R. Eye H/O hepatitis C with cirrhosis and thrombocytopenia and chronic transaminase elevation CAD S/P PTCA 03/25/10 in [place] last Nuclear stress test on 01/12/12 showed: Marland Kitchen LV Ejection Fraction: 55%. LV Wall Motion: NL LV Function; NL Wall Motion. Smoker GERD with barretts esophagus Htn  Plan GA with oral ETT  Kipp Brood, MD )      Anesthesia Quick Evaluation

## 2012-08-18 NOTE — Anesthesia Postprocedure Evaluation (Signed)
  Anesthesia Post-op Note  Patient: Patrick [name]  Procedure(s) Performed: Procedure(s) (LRB) with comments: CATARACT EXTRACTION PHACO AND INTRAOCULAR LENS PLACEMENT (IOC) (Right)  Patient Location: PACU  Anesthesia Type:MAC  Level of Consciousness: awake, alert  and oriented  Airway and Oxygen Therapy: Patient Spontanous Breathing and Patient connected to nasal cannula oxygen  Post-op Pain: none  Post-op Assessment: Post-op Vital signs reviewed, Patient's Cardiovascular Status Stable, Respiratory Function Stable, Patent Airway, No signs of Nausea or vomiting and Pain level controlled  Post-op Vital Signs: stable  Complications: No apparent anesthesia complications

## 2012-08-18 NOTE — Transfer of Care (Signed)
Immediate Anesthesia Transfer of Care Note  Patient: Patrick [name]  Procedure(s) Performed: Procedure(s) (LRB) with comments: CATARACT EXTRACTION PHACO AND INTRAOCULAR LENS PLACEMENT (IOC) (Right)  Patient Location: PACU  Anesthesia Type:General  Level of Consciousness: awake, alert  and oriented  Airway & Oxygen Therapy: Patient Spontanous Breathing and Patient connected to nasal cannula oxygen  Post-op Assessment: Report given to PACU RN, Post -op Vital signs reviewed and stable and Patient moving all extremities X 4  Post vital signs: Reviewed and stable  Complications: No apparent anesthesia complications

## 2012-08-18 NOTE — H&P (Signed)
Pre-operative History and Physical for Ophthalmic Surgery  Patrick [name] 08/18/2012                  Chief Complaint: Decreased vision right eye  Diagnosis:  Combined Cataract  No Known Allergies   Prior to Admission medications   Medication Sig Start Date End Date Taking? Authorizing Provider  amLODipine (NORVASC) 10 MG tablet Take 10 mg by mouth daily.     Yes Historical Provider, MD  aspirin 81 MG tablet Take 81 mg by mouth daily.     Yes Historical Provider, MD  carvedilol (COREG) 12.5 MG tablet Take 1 tablet (12.5 mg total) by mouth 2 (two) times daily with a meal. 01/02/12  Yes Iva Boop, MD  folic acid (FOLVITE) 1 MG tablet Take 1 mg by mouth daily.     Yes Historical Provider, MD  lisinopril (PRINIVIL,ZESTRIL) 20 MG tablet Take 20 mg by mouth daily.     Yes Historical Provider, MD  Multiple Vitamins-Minerals (MULTIVITAMIN WITH MINERALS) tablet Take 1 tablet by mouth daily.   Yes Historical Provider, MD  omeprazole (PRILOSEC) 20 MG capsule Take 1 capsule (20 mg total) by mouth daily. 11/18/11 11/17/12 Yes Iva Boop, MD  simvastatin (ZOCOR) 40 MG tablet Take 40 mg by mouth every evening.   Yes Historical Provider, MD  HYDROcodone-acetaminophen (NORCO) 5-325 MG per tablet Take 1 tablet by mouth 2 (two) times daily as needed. pain    Historical Provider, MD  nitroGLYCERIN (NITROSTAT) 0.4 MG SL tablet Place 1 tablet (0.4 mg total) under the tongue every 5 (five) minutes as needed. Up to 3 doses. If pain persist then call 911 01/20/12   Lewayne Bunting, MD    Planned Procedure:                                       Phacoemulsification, Posterior Chamber Intra-ocular Lens Right Eye  Filed Vitals:   08/18/12 0633  BP: 124/86  Pulse: 69  Temp: 97.8 F (36.6 C)  Resp: 18     Past Medical History  Diagnosis Date  . MI (myocardial infarction)     x3, jan, aug, nov 2011; treated at [facility] in [place]  . Sexual dysfunction   . Arthritis   . HTN (hypertension)   .  Hyperlipidemia   . Chronic chest pain   . CAD (coronary artery disease)   . Thrombocytopenia   . Cataract   . SSBE (short-segment Barrett's esophagus) 11/11/11  . Colon adenomas 11/11/11    x 5, one rectal tv adenoma  . GERD (gastroesophageal reflux disease)   . 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 Barrett's esophagus 11/17/2011    Short-segment dx 11/2011   . Alcoholism 01/02/2012    Past Surgical History  Procedure Date  . Hernia repair     very young  . Balloon angioplasty, artery 03/2010  . Colonoscopy 11/11/11    Dr. Stan Head  . Esophagogastroduodenoscopy 11/11/11    Dr. Stan Head  . Cardiac catheterization 01/31/2011  . Coronary angioplasty      History   Social History  . Marital Status: Single    Spouse Name: N/A    Number of Children: 3  . Years of Education: N/A   Occupational History  .      Unemployed   Social History Main Topics  . Smoking status: Current Every Day Smoker --  0.2 packs/day for 45 years    Types: Cigarettes  . Smokeless tobacco: Never Used  . Alcohol Use: Yes     Comment: Occasional hasn't had any since 1st of January 2013  . Drug Use: No     Comment: nothing since 2011; previous heroin, cocaine and Marijuana  . Sexually Active: Not on file   Other Topics Concern  . Not on file   Social History Narrative  . No narrative on file     The following examination is for anesthesia clearance for minimally invasive Ophthalmic surgery. It is primarily to document heart and lung findings and is not intended to elucidate unknown general medical conditions inclusive of abdominal masses, lung lesions, etc.   General Constitution:  within normal limits   Alertness/Orientation:  Person, time place     yes   HEENT:  Eye Findings:  Combined Cataract                   right eye  Neck: supple without masses  Chest/Lungs: clear to auscultation  Cardiac: Normal S1 and S2 without Murmur, S3 or S4  Neuro: non fasting   Impression:   Combined Cataract  Planned Procedure:  Phacoemulsification, Posterior Chamber Intraocular Lens    [name], MD

## 2012-08-18 NOTE — Op Note (Signed)
Patrick [name] 08/18/2012 Cataract: Combined, Nuclear  Procedure: Phacoemulsification, Posterior Chamber Intra-ocular Lens Operative Eye:  right eye  Surgeon: [name] Estimated Blood Loss: minimal Specimens for Pathology:  None Complications: none  The patient was prepared and draped in the usual manner for ocular surgery on the right eye. A Cook lid speculum was placed. A peripheral clear corneal incision was made at the surgical limbus centered at the 11:00 meridian. A separate clear corneal stab incision was made with a 15 degree blade at the 2:00 meridian to permit bi-manual technique. Viscoat and  Provisc as an underlying layer next to the capsule was instilled into the anterior chamber through that incision.  A keratome was used to create a self sealing incision entering the anterior chamber at the 11:00 meridian. A capsulorhexis was performed using a bent 25g needle. The lens was hydrodissected and the nucleus was hydrodilineated using a Nichammin cannula. The Chang chopper was inserted and used to rotate the lens to insure adequate lens mobility. The phacoemulsification handpiece was inserted and a combined phaco-chop technique was employed, fracturing the lens into separate sections with subsequent removal with the phaco handpiece.   The I/A cannula was used to remove remaining lens cortex. Provisc was instilled and used to deepen the anterior chamber and posterior capsule bag. The Monarch injector was used to place a folded Acrysof [id number] PC IOL, + 18.50  diopters, into the capsule bag. A Sinskey lens hook was used to dial in the trailing haptic.  The I/A cannula was used to remove the viscoelastic from the anterior chamber. BSS was used to bring IOP to the desired range and the wound was checked to insure it was watertight. Subconjunctival injections of Ancef 100/0.49ml and Dexamethasone 0.5 ml of a 10mg /64ml solution were placed without complication. The lid speculum and drapes were  removed and the patient's eye was patched with Polymixin/Bacitracin ophthalmic ointment. An eye shield was placed and the patient was transferred alert and conversant from the operating room to the post-operative recovery area.   [name], MD

## 2012-08-19 ENCOUNTER — Other Ambulatory Visit: Payer: Self-pay | Admitting: Ophthalmology

## 2012-08-20 ENCOUNTER — Encounter (HOSPITAL_COMMUNITY): Payer: Self-pay | Admitting: Ophthalmology

## 2012-09-03 ENCOUNTER — Other Ambulatory Visit: Payer: Self-pay

## 2012-09-03 ENCOUNTER — Encounter (HOSPITAL_COMMUNITY): Payer: Self-pay

## 2012-09-03 ENCOUNTER — Emergency Department (HOSPITAL_COMMUNITY): Payer: Medicare Other

## 2012-09-03 ENCOUNTER — Observation Stay (HOSPITAL_COMMUNITY)
Admission: EM | Admit: 2012-09-03 | Discharge: 2012-09-05 | Disposition: A | Discharge status: Home / Self Care | Payer: Medicare Other | Attending: Cardiology | Admitting: Cardiology

## 2012-09-03 DIAGNOSIS — D129 Benign neoplasm of anus and anal canal: Secondary | ICD-10-CM

## 2012-09-03 DIAGNOSIS — Z79899 Other long term (current) drug therapy: Secondary | ICD-10-CM | POA: Insufficient documentation

## 2012-09-03 DIAGNOSIS — F1910 Other psychoactive substance abuse, uncomplicated: Secondary | ICD-10-CM

## 2012-09-03 DIAGNOSIS — Z720 Tobacco use: Secondary | ICD-10-CM | POA: Diagnosis present

## 2012-09-03 DIAGNOSIS — Z7902 Long term (current) use of antithrombotics/antiplatelets: Secondary | ICD-10-CM | POA: Insufficient documentation

## 2012-09-03 DIAGNOSIS — Z9861 Coronary angioplasty status: Secondary | ICD-10-CM | POA: Insufficient documentation

## 2012-09-03 DIAGNOSIS — K7460 Unspecified cirrhosis of liver: Secondary | ICD-10-CM | POA: Insufficient documentation

## 2012-09-03 DIAGNOSIS — I2510 Atherosclerotic heart disease of native coronary artery without angina pectoris: Secondary | ICD-10-CM | POA: Insufficient documentation

## 2012-09-03 DIAGNOSIS — B1920 Unspecified viral hepatitis C without hepatic coma: Secondary | ICD-10-CM | POA: Insufficient documentation

## 2012-09-03 DIAGNOSIS — K2270 Barrett's esophagus without dysplasia: Secondary | ICD-10-CM

## 2012-09-03 DIAGNOSIS — G8929 Other chronic pain: Secondary | ICD-10-CM | POA: Insufficient documentation

## 2012-09-03 DIAGNOSIS — F1410 Cocaine abuse, uncomplicated: Secondary | ICD-10-CM | POA: Insufficient documentation

## 2012-09-03 DIAGNOSIS — F1010 Alcohol abuse, uncomplicated: Secondary | ICD-10-CM | POA: Insufficient documentation

## 2012-09-03 DIAGNOSIS — Z86010 Personal history of colonic polyps: Secondary | ICD-10-CM

## 2012-09-03 DIAGNOSIS — D759 Disease of blood and blood-forming organs, unspecified: Secondary | ICD-10-CM

## 2012-09-03 DIAGNOSIS — I10 Essential (primary) hypertension: Secondary | ICD-10-CM | POA: Insufficient documentation

## 2012-09-03 DIAGNOSIS — F1020 Alcohol dependence, uncomplicated: Secondary | ICD-10-CM

## 2012-09-03 DIAGNOSIS — B182 Chronic viral hepatitis C: Secondary | ICD-10-CM

## 2012-09-03 DIAGNOSIS — E785 Hyperlipidemia, unspecified: Secondary | ICD-10-CM | POA: Diagnosis present

## 2012-09-03 DIAGNOSIS — R079 Chest pain, unspecified: Principal | ICD-10-CM

## 2012-09-03 DIAGNOSIS — D696 Thrombocytopenia, unspecified: Secondary | ICD-10-CM

## 2012-09-03 LAB — BASIC METABOLIC PANEL
CO2: 18 mEq/L — ABNORMAL LOW (ref 19–32)
Calcium: 9.5 mg/dL (ref 8.4–10.5)
Creatinine, Ser: 0.86 mg/dL (ref 0.50–1.35)

## 2012-09-03 LAB — URINE MICROSCOPIC-ADD ON

## 2012-09-03 LAB — RAPID URINE DRUG SCREEN, HOSP PERFORMED
Amphetamines: NOT DETECTED
Cocaine: POSITIVE — AB
Opiates: NOT DETECTED
Tetrahydrocannabinol: NOT DETECTED

## 2012-09-03 LAB — URINALYSIS, ROUTINE W REFLEX MICROSCOPIC
Glucose, UA: NEGATIVE mg/dL
Leukocytes, UA: NEGATIVE
Protein, ur: 100 mg/dL — AB
Specific Gravity, Urine: 1.012 (ref 1.005–1.030)
pH: 6 (ref 5.0–8.0)

## 2012-09-03 LAB — CBC
MCH: 32.4 pg (ref 26.0–34.0)
MCHC: 35.6 g/dL (ref 30.0–36.0)
MCV: 91.2 fL (ref 78.0–100.0)
Platelets: 87 10*3/uL — ABNORMAL LOW (ref 150–400)
RBC: 4.78 MIL/uL (ref 4.22–5.81)
RDW: 16 % — ABNORMAL HIGH (ref 11.5–15.5)

## 2012-09-03 LAB — POCT I-STAT TROPONIN I: Troponin i, poc: 0.02 ng/mL (ref 0.00–0.08)

## 2012-09-03 LAB — TROPONIN I: Troponin I: <0.3 ng/mL (ref <0.30)

## 2012-09-03 MED ORDER — ASPIRIN 81 MG PO CHEW
324.0000 mg | CHEWABLE_TABLET | Freq: Once | ORAL | Status: AC
  Administered 2012-09-03: 324 mg via ORAL
  Filled 2012-09-03: qty 4

## 2012-09-03 MED ORDER — ADULT MULTIVITAMIN W/MINERALS CH
1.0000 | ORAL_TABLET | Freq: Every day | ORAL | Status: DC
  Administered 2012-09-03 – 2012-09-05 (×3): 1 via ORAL
  Filled 2012-09-03 (×3): qty 1

## 2012-09-03 MED ORDER — ASPIRIN EC 81 MG PO TBEC
81.0000 mg | DELAYED_RELEASE_TABLET | Freq: Every day | ORAL | Status: DC
  Administered 2012-09-03 – 2012-09-05 (×3): 81 mg via ORAL
  Filled 2012-09-03 (×3): qty 1

## 2012-09-03 MED ORDER — PANTOPRAZOLE SODIUM 40 MG PO TBEC
40.0000 mg | DELAYED_RELEASE_TABLET | Freq: Every day | ORAL | Status: DC
  Administered 2012-09-04 – 2012-09-05 (×2): 40 mg via ORAL
  Filled 2012-09-03 (×2): qty 1

## 2012-09-03 MED ORDER — MULTI-VITAMIN/MINERALS PO TABS: 1.0000 | ORAL_TABLET | Freq: Every day | ORAL | Status: DC

## 2012-09-03 MED ORDER — HYDROCODONE-ACETAMINOPHEN 5-325 MG PO TABS: 1.0000 | ORAL_TABLET | ORAL | Status: DC | PRN

## 2012-09-03 MED ORDER — ZOLPIDEM TARTRATE 5 MG PO TABS: 5.0000 mg | ORAL_TABLET | Freq: Every evening | ORAL | Status: DC | PRN

## 2012-09-03 MED ORDER — NITROGLYCERIN IN D5W 200-5 MCG/ML-% IV SOLN
5.0000 ug/min | INTRAVENOUS | Status: DC
  Administered 2012-09-03: 5 ug/min via INTRAVENOUS
  Administered 2012-09-03: 10 ug/min via INTRAVENOUS
  Filled 2012-09-03: qty 250

## 2012-09-03 MED ORDER — ASPIRIN 81 MG PO TABS: 81.0000 mg | ORAL_TABLET | Freq: Every day | ORAL | Status: DC

## 2012-09-03 MED ORDER — VITAMIN B-1 100 MG PO TABS
100.0000 mg | ORAL_TABLET | Freq: Every day | ORAL | Status: DC
  Administered 2012-09-03 – 2012-09-05 (×3): 100 mg via ORAL
  Filled 2012-09-03 (×3): qty 1

## 2012-09-03 MED ORDER — LORAZEPAM 2 MG/ML IJ SOLN
1.0000 mg | Freq: Once | INTRAMUSCULAR | Status: AC
  Administered 2012-09-03: 1 mg via INTRAVENOUS
  Filled 2012-09-03: qty 1

## 2012-09-03 MED ORDER — SODIUM CHLORIDE 0.9 % IV BOLUS (SEPSIS)
1000.0000 mL | Freq: Once | INTRAVENOUS | Status: AC
  Administered 2012-09-03: 1000 mL via INTRAVENOUS

## 2012-09-03 MED ORDER — SIMVASTATIN 40 MG PO TABS: 40.0000 mg | ORAL_TABLET | Freq: Every evening | ORAL | Status: DC

## 2012-09-03 MED ORDER — ONDANSETRON HCL 4 MG/2ML IJ SOLN: 4.0000 mg | Freq: Four times a day (QID) | INTRAMUSCULAR | Status: DC | PRN

## 2012-09-03 MED ORDER — ATORVASTATIN CALCIUM 20 MG PO TABS
20.0000 mg | ORAL_TABLET | Freq: Every day | ORAL | Status: DC
  Administered 2012-09-03 – 2012-09-05 (×3): 20 mg via ORAL
  Filled 2012-09-03 (×3): qty 1

## 2012-09-03 MED ORDER — LORAZEPAM 1 MG PO TABS: 1.0000 mg | ORAL_TABLET | Freq: Four times a day (QID) | ORAL | Status: DC | PRN

## 2012-09-03 MED ORDER — ONDANSETRON HCL 4 MG/2ML IJ SOLN
4.0000 mg | Freq: Once | INTRAMUSCULAR | Status: AC
  Administered 2012-09-03: 4 mg via INTRAVENOUS
  Filled 2012-09-03: qty 2

## 2012-09-03 MED ORDER — THIAMINE HCL 100 MG/ML IJ SOLN
100.0000 mg | Freq: Every day | INTRAMUSCULAR | Status: DC
  Filled 2012-09-03 (×3): qty 1

## 2012-09-03 MED ORDER — MORPHINE SULFATE 2 MG/ML IJ SOLN
2.0000 mg | INTRAMUSCULAR | Status: DC | PRN
  Administered 2012-09-03: 4 mg via INTRAVENOUS
  Filled 2012-09-03: qty 2

## 2012-09-03 MED ORDER — MORPHINE SULFATE 4 MG/ML IJ SOLN
4.0000 mg | Freq: Once | INTRAMUSCULAR | Status: AC
  Administered 2012-09-03: 4 mg via INTRAVENOUS
  Filled 2012-09-03: qty 1

## 2012-09-03 MED ORDER — LORAZEPAM 2 MG/ML IJ SOLN: 1.0000 mg | Freq: Four times a day (QID) | INTRAMUSCULAR | Status: DC | PRN

## 2012-09-03 MED ORDER — FOLIC ACID 1 MG PO TABS
1.0000 mg | ORAL_TABLET | Freq: Every day | ORAL | Status: DC
  Administered 2012-09-03 – 2012-09-05 (×3): 1 mg via ORAL
  Filled 2012-09-03 (×3): qty 1

## 2012-09-03 MED ORDER — LISINOPRIL 20 MG PO TABS
20.0000 mg | ORAL_TABLET | Freq: Every day | ORAL | Status: DC
  Administered 2012-09-03 – 2012-09-05 (×3): 20 mg via ORAL
  Filled 2012-09-03 (×3): qty 1

## 2012-09-03 MED ORDER — AMLODIPINE BESYLATE 10 MG PO TABS
10.0000 mg | ORAL_TABLET | Freq: Every day | ORAL | Status: DC
  Administered 2012-09-04 – 2012-09-05 (×2): 10 mg via ORAL
  Filled 2012-09-03 (×3): qty 1

## 2012-09-03 MED ORDER — ACETAMINOPHEN 325 MG PO TABS
650.0000 mg | ORAL_TABLET | ORAL | Status: DC | PRN
  Administered 2012-09-04: 650 mg via ORAL
  Filled 2012-09-03: qty 2

## 2012-09-03 MED ORDER — NITROGLYCERIN 0.4 MG SL SUBL: 0.4000 mg | SUBLINGUAL_TABLET | SUBLINGUAL | Status: DC | PRN

## 2012-09-03 NOTE — ED Notes (Signed)
Per EMS: Pt from home reporting 9/10 central chest pain radiating to left chest since 11 AM. Pt reports drinking for last two days and doing cocaine this AM. Pt took 2 nitro prior to EMS arrival with no relief. EMS gave 1 nitro, no relief. Refused aspirin due to missing dentures. Denies N/V/weakness.132/102. 76 NS. 99% RA. AO x4.

## 2012-09-03 NOTE — ED Notes (Signed)
Called portable for a warming blanket

## 2012-09-03 NOTE — ED Notes (Signed)
Pt reports drinking for last two days. Unsure of how much he has consumed. Pt also reports cocaine use this AM. Pt reporting new onset of numbness to "all my toes". AO x 4. NAD. Pt calm and cooperative.

## 2012-09-03 NOTE — ED Notes (Signed)
Pt shivering but AO x4. Reports sleeping inside last night, but states "it wasn't somewhere you would want to sleep." Warm blankets placed on pt. Warm fluids started.

## 2012-09-03 NOTE — ED Notes (Signed)
Urine  Sample requested.

## 2012-09-03 NOTE — ED Notes (Signed)
3 EKG's captured on pt in first 10 minutes but unable to transfer to printer. Portable EKG ordered. MD Linker at bedside.

## 2012-09-03 NOTE — ED Provider Notes (Signed)
History     CSN: [id number]  Arrival date & time 09/03/12  1156   First MD Initiated Contact with Patient 09/03/12 1225      Chief Complaint  Patient presents with  . Chest Pain    (Consider location/radiation/quality/duration/timing/severity/associated sxs/prior treatment) HPI Pt presents with c/o chest pain.  He endorses using cocaine this morning and has been drinking etoh over the past 2 days.  Was outside in the cold "hanging out" with some people last night.  He states chest pain began approx 2 hours ago and was sharp and also described as pressure.  He has hx of CAD and has stent in place.  States he was taken off plavix.  He did have nausea associated with this pain, but no radiation or diaphoresis.  Chest pain located in mid chest.  No fever/chills.  No leg swelling.  He had 3 nitroglycerin prior to arrival which has helped the pain somewhat but it is still present.  There are no other associated systemic symptoms, there are no other alleviating or modifying factors.   Past Medical History  Diagnosis Date  . MI (myocardial infarction)     x3, jan, aug, nov 2011; treated at [facility] in [place]  . Sexual dysfunction   . Arthritis   . HTN (hypertension)   . Hyperlipidemia   . Chronic chest pain   . CAD (coronary artery disease)   . Thrombocytopenia   . Cataract   . SSBE (short-segment Barrett's esophagus) 11/11/11  . Colon adenomas 11/11/11    x 5, one rectal tv adenoma  . GERD (gastroesophageal reflux disease)   . 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 Barrett's esophagus 11/17/2011    Short-segment dx 11/2011   . Alcoholism 01/02/2012    Past Surgical History  Procedure Date  . Hernia repair     very young  . Balloon angioplasty, artery 03/2010  . Colonoscopy 11/11/11    Dr. Stan Head  . Esophagogastroduodenoscopy 11/11/11    Dr. Stan Head  . Cardiac catheterization 01/31/2011  . Coronary angioplasty   . Cataract extraction w/phaco 08/18/2012    Procedure:  CATARACT EXTRACTION PHACO AND INTRAOCULAR LENS PLACEMENT (IOC);  Surgeon: [name], MD;  Location: [facility] OR;  Service: Ophthalmology;  Laterality: Right;    Family History  Problem Relation Age of Onset  . Cancer      mother's side/fathers side, 2 uncles  . Heart attack Maternal Grandfather   . Diabetes Sister   . Stroke Sister   . Colon cancer Neg Hx   . Esophageal cancer Neg Hx   . Rectal cancer Neg Hx   . Stomach cancer Neg Hx     History  Substance Use Topics  . Smoking status: Current Every Day Smoker -- 0.2 packs/day for 45 years    Types: Cigarettes  . Smokeless tobacco: Never Used  . Alcohol Use: Yes      Review of Systems ROS reviewed and all otherwise negative except for mentioned in HPI  Allergies  Review of patient's allergies indicates no known allergies.  Home Medications   Current Outpatient Rx  Name  Route  Sig  Dispense  Refill  . AMLODIPINE BESYLATE 10 MG PO TABS   Oral   Take 10 mg by mouth daily.           . ASPIRIN 81 MG PO TABS   Oral   Take 81 mg by mouth daily.           Marland Kitchen  FOLIC ACID 1 MG PO TABS   Oral   Take 1 mg by mouth daily.           Marland Kitchen LISINOPRIL 20 MG PO TABS   Oral   Take 20 mg by mouth daily.           . MULTI-VITAMIN/MINERALS PO TABS   Oral   Take 1 tablet by mouth daily.         Marland Kitchen NITROGLYCERIN 0.4 MG SL SUBL   Sublingual   Place 1 tablet (0.4 mg total) under the tongue every 5 (five) minutes as needed. Up to 3 doses. If pain persist then call 911   25 tablet   12   . OMEPRAZOLE 20 MG PO CPDR   Oral   Take 1 capsule (20 mg total) by mouth daily.   30 capsule   11   . SIMVASTATIN 40 MG PO TABS   Oral   Take 40 mg by mouth every evening.           BP 148/90  Pulse 94  Temp 98.3 F (36.8 C) (Oral)  Resp 21  SpO2 93% Vitals reviewed Physical Exam Physical Examination: General appearance - alert, well appearing, and in no distress Mental status - alert, oriented to person, place, and  time Eyes - pupils equal and reactive, no conjunctival injection, no scleral icterus Mouth - mucous membranes moist, pharynx normal without lesions Chest - clear to auscultation, no wheezes, rales or rhonchi, symmetric air entry Heart - normal rate, regular rhythm, normal S1, S2, no murmurs, rubs, clicks or gallops Abdomen - soft, nontender, nondistended, no masses or organomegaly Extremities - peripheral pulses normal, no pedal edema, no clubbing or cyanosis Skin - normal coloration and turgor, no rashes  ED Course  Procedures (including critical care time)   Date: 09/03/2012  Rate: 83  Rhythm: normal sinus rhythm  QRS Axis: normal  Intervals: QT prolonged  ST/T Wave abnormalities: nonspecific ST/T changes, a lot of baseline artifact  Conduction Disutrbances:none  Narrative Interpretation:   Old EKG Reviewed: none available  1:50 PM pain remains at an 8/10- starting nitro drip  3:01 PM chest pain continues, d/w [facility] cardiology- they will come to see in the ED  CRITICAL CARE Performed by: Ethelda Chick   Total critical care time: 40  Critical care time was exclusive of separately billable procedures and treating other patients.  Critical care was necessary to treat or prevent imminent or life-threatening deterioration.  Critical care was time spent personally by me on the following activities: development of treatment plan with patient and/or surrogate as well as nursing, discussions with consultants, evaluation of patient's response to treatment, examination of patient, obtaining history from patient or surrogate, ordering and performing treatments and interventions, ordering and review of laboratory studies, ordering and review of radiographic studies, pulse oximetry and re-evaluation of patient's condition.    Labs Reviewed  CBC - Abnormal; Notable for the following:    WBC 2.9 (*)     RDW 16.0 (*)     Platelets 87 (*)  CONSISTENT WITH PREVIOUS RESULT   All other  components within normal limits  BASIC METABOLIC PANEL - Abnormal; Notable for the following:    CO2 18 (*)     All other components within normal limits  URINALYSIS, ROUTINE W REFLEX MICROSCOPIC - Abnormal; Notable for the following:    Hgb urine dipstick SMALL (*)     Protein, ur 100 (*)     All other  components within normal limits  URINE RAPID DRUG SCREEN (HOSP PERFORMED) - Abnormal; Notable for the following:    Cocaine POSITIVE (*)     All other components within normal limits  POCT I-STAT TROPONIN I  URINE MICROSCOPIC-ADD ON  CULTURE, BLOOD (ROUTINE X 2)  CULTURE, BLOOD (ROUTINE X 2)  URINE CULTURE   Dg Chest 2 View  09/03/2012  *RADIOLOGY REPORT*  Clinical Data: Chest pain, history of MI, cardiac stents  CHEST - 2 VIEW  Comparison: 08/16/2012; 01/29/2011  Findings: Grossly unchanged cardiac silhouette and mediastinal contours with mild tortuosity of the thoracic aorta.  There is unchanged mild diffuse thickening of the pulmonary interstitium. No definite evidence of pulmonary edema.  No focal airspace opacities.  No pleural effusion or pneumothorax.  Unchanged bones.  IMPRESSION: No acute cardiopulmonary disease.   Original Report Authenticated By: Tacey Ruiz, MD      1. Chest pain   2. Cocaine abuse       MDM  Pt with hx of CAD and recent cocaine use presenting with c/o chest pain.  SL Nitro did help to relieve pain somewhat but it continues.  Pt started on nitro drip, given ativan as well as morphine.  EKG reassuring as well as troponin.  D/w [facility] cardiology who has seen patient in the ED and plans to admit patient for further management.         Ethelda Chick, MD 09/03/12 [phone]

## 2012-09-03 NOTE — ED Notes (Signed)
Admitting MD at bedside.

## 2012-09-03 NOTE — ED Notes (Signed)
Urine collected.  Patients arm band checked and confirmed with stickers.  Initials time date and emp. Number placed on sample.

## 2012-09-03 NOTE — H&P (Signed)
History and Physical   Patient ID: Patrick [name] MRN: [id number], DOB/AGE: March 12, 1951   Admit date: 09/03/2012 Date of Consult: 09/03/2012   Primary Physician: Quitman Livings, MD Primary Cardiologist: Olga Millers, MD  HPI: Patrick [name] is a 62 y.o. male with PMHx s/f CAD (s/p prior PCI), hepatitis C with cirrhosis, chronic resultant thombocytopenia, HTN, HLD and polysubstance abuse who presents to [facility] ED with chest pain in the setting of cocaine use.   Previously resided in [place]. Patient states he had myocardial infarction in 08/2009, 03/2010 and 06/2010. The first 2 occurred in the setting of cocaine use. He apparently had PCI following the second event. Records showed that his catheterization revealed a 70% mid LAD and a 99% stenosis in the posterior left ventricular branch of the RCA. He had angioplasty of that lesion in 03/2010. Myoview 5/12 - mild inferior ischemia and EF 45%. Patient continued to have chest pain and cardiac catheterization was therefore performed in 01/2011 revealing EF 60-65%. Normal LM, LAD - proximal 30-40% stenosis, mid 60-70% stenosis, and distal 50% stenosis. Left cx - 40-50% mid stenosis and a 50% stenosis into the first OM branch. RCA mid vessel has 50% stenosis. The PDA has 40-50% stenosis. Medical therapy recommended. He saw Dr. Jens Som in 12/2011 c/o atypical chest pain for which a 01/2012 Myoview revealed no evidence of ischemia, EF 55%.  He presents to [facility] ED today c/o chest pain after using cocaine this AM. He denies antecedent exertional chest pain, shortness of breath, PND, orthopnea, LE edema or syncope. Notes occasional lightheadedness and palpitations. He took cocaine late last night/this morning then developed substernal chest tightness w/o radiation and associated nausea. He subsequently presented to [facility] ED. He did consume EtOH as well. He does take a Coreg daily.   In the ED, EKG reveals NSR, no ST/T changes. POC TnI WNL. CBC  reveals a neutropenia and thrombocytopenia. BMET unremarkable. CXR unremarkable. UDS + cocaine. U/a unremarkable. Blood cultures x 2 pending.   Problem List: Past Medical History  Diagnosis Date  . MI (myocardial infarction)     x3, jan, aug, nov 2011; treated at [facility] in [place]  . Sexual dysfunction   . Arthritis   . HTN (hypertension)   . Hyperlipidemia   . Chronic chest pain   . CAD (coronary artery disease)   . Thrombocytopenia   . Cataract   . SSBE (short-segment Barrett's esophagus) 11/11/11  . Colon adenomas 11/11/11    x 5, one rectal tv adenoma  . GERD (gastroesophageal reflux disease)   . 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 Barrett's esophagus 11/17/2011    Short-segment dx 11/2011   . Alcoholism 01/02/2012    Past Surgical History  Procedure Date  . Hernia repair     very young  . Balloon angioplasty, artery 03/2010  . Colonoscopy 11/11/11    Dr. Stan Head  . Esophagogastroduodenoscopy 11/11/11    Dr. Stan Head  . Cardiac catheterization 01/31/2011  . Coronary angioplasty   . Cataract extraction w/phaco 08/18/2012    Procedure: CATARACT EXTRACTION PHACO AND INTRAOCULAR LENS PLACEMENT (IOC);  Surgeon: [name], MD;  Location: [facility] OR;  Service: Ophthalmology;  Laterality: Right;     Allergies: No Known Allergies  Home Medications: Prior to Admission medications   Medication Sig Start Date End Date Taking? Authorizing Provider  amLODipine (NORVASC) 10 MG tablet Take 10 mg by mouth daily.     Yes Historical Provider, MD  aspirin 81 MG tablet  Take 81 mg by mouth daily.     Yes Historical Provider, MD  carvedilol (COREG) 12.5 MG tablet Take 1 tablet (12.5 mg total) by mouth 2 (two) times daily with a meal. 01/02/12  Yes Iva Boop, MD  folic acid (FOLVITE) 1 MG tablet Take 1 mg by mouth daily.     Yes Historical Provider, MD  lisinopril (PRINIVIL,ZESTRIL) 20 MG tablet Take 20 mg by mouth daily.     Yes Historical Provider, MD  Multiple  Vitamins-Minerals (MULTIVITAMIN WITH MINERALS) tablet Take 1 tablet by mouth daily.   Yes Historical Provider, MD  nitroGLYCERIN (NITROSTAT) 0.4 MG SL tablet Place 1 tablet (0.4 mg total) under the tongue every 5 (five) minutes as needed. Up to 3 doses. If pain persist then call 911 01/20/12  Yes Lewayne Bunting, MD  omeprazole (PRILOSEC) 20 MG capsule Take 1 capsule (20 mg total) by mouth daily. 11/18/11 11/17/12 Yes Iva Boop, MD  simvastatin (ZOCOR) 40 MG tablet Take 40 mg by mouth every evening.   Yes Historical Provider, MD    Inpatient Medications:     (Not in a hospital admission)  Family History  Problem Relation Age of Onset  . Cancer      mother's side/fathers side, 2 uncles  . Heart attack Maternal Grandfather   . Diabetes Sister   . Stroke Sister   . Colon cancer Neg Hx   . Esophageal cancer Neg Hx   . Rectal cancer Neg Hx   . Stomach cancer Neg Hx      History   Social History  . Marital Status: Single    Spouse Name: N/A    Number of Children: 3  . Years of Education: N/A   Occupational History  .      Unemployed   Social History Main Topics  . Smoking status: Current Every Day Smoker -- 0.2 packs/day for 45 years    Types: Cigarettes  . Smokeless tobacco: Never Used  . Alcohol Use: Yes  . Drug Use: No     Comment: Cocaine + 08/2012. Previous heroin, cocaine and marijuana abuse.   Marland Kitchen Sexually Active: Not on file   Other Topics Concern  . Not on file   Social History Narrative  . No narrative on file     Review of Systems: General: negative for chills, fever, night sweats or weight changes.  Cardiovascular: positive for chest pain, negative for dyspnea on exertion, edema, orthopnea, palpitations, paroxysmal nocturnal dyspnea or shortness of breath Dermatological: negative for rash Respiratory: negative for cough or wheezing Urologic: negative for hematuria Abdominal: positive for nausea, negative for vomiting, diarrhea, bright red blood per  rectum, melena, or hematemesis Neurologic: negative for visual changes, syncope, or dizziness All other systems reviewed and are otherwise negative except as noted above.  Physical Exam: Blood pressure 136/90, pulse 92, temperature 98.3 F (36.8 C), temperature source Oral, resp. rate 23, SpO2 97.00%.    General: Well developed, well nourished, in no acute distress. Head: Normocephalic, atraumatic, sclera non-icteric, no xanthomas, nares are without discharge.  Neck: Negative for carotid bruits. JVD not elevated. Lungs: Clear bilaterally to auscultation without wheezes, rales, or rhonchi. Breathing is unlabored. Heart: RRR with S1 S2. No murmurs, rubs, or gallops appreciated. Abdomen: Soft, non-tender, non-distended with normoactive bowel sounds. No hepatomegaly. No rebound/guarding. No obvious abdominal masses. Msk:  Strength and tone appears normal for age. Extremities: No clubbing, cyanosis or edema.  Distal pedal pulses are 2+ and equal bilaterally. Neuro:  Alert and oriented X 3. Moves all extremities spontaneously. Psych:  Responds to questions appropriately with a normal affect.  Labs: Recent Labs  Basename 09/03/12 1228   WBC 2.9*   HGB 15.5   HCT 43.6   MCV 91.2   PLT 87*    Lab 09/03/12 1228  NA 142  K 3.6  CL 106  CO2 18*  BUN 6  CREATININE 0.86  CALCIUM 9.5  PROT --  BILITOT --  ALKPHOS --  ALT --  AST --  AMYLASE --  LIPASE --  GLUCOSE 81   Radiology/Studies: Dg Chest 2 View  09/03/2012  *RADIOLOGY REPORT*  Clinical Data: Chest pain, history of MI, cardiac stents  CHEST - 2 VIEW  Comparison: 08/16/2012; 01/29/2011  Findings: Grossly unchanged cardiac silhouette and mediastinal contours with mild tortuosity of the thoracic aorta.  There is unchanged mild diffuse thickening of the pulmonary interstitium. No definite evidence of pulmonary edema.  No focal airspace opacities.  No pleural effusion or pneumothorax.  Unchanged bones.  IMPRESSION: No acute  cardiopulmonary disease.   Original Report Authenticated By: Tacey Ruiz, MD    Dg Chest 2 View  08/16/2012  *RADIOLOGY REPORT*  Clinical Data: Preoperative evaluation for cataract surgery.  CHEST - 2 VIEW  Comparison: 01/29/2011  Findings: The lungs are clear without focal infiltrate, edema, pneumothorax or pleural effusion. Cardiopericardial silhouette is at upper limits of normal for size. Imaged bony structures of the thorax are intact.  IMPRESSION: Stable.  No acute findings.   Original Report Authenticated By: Kennith Center, M.D.    EKG: NSR, 91 bpm, LAD, no ST/T changes  ASSESSMENT:   62 y.o. male with PMHx s/f CAD (s/p prior PCI), hepatitis C with cirrhosis, chronic resultant thombocytopenia, HTN, HLD and polysubstance abuse who presents to [facility] ED with chest pain in the setting of cocaine use.  1. Chest pain in the setting of cocaine use 2. CAD s/p prior PCI 3. Polysubstance abuse 4. Hepatitis C with cirrhosis 5. Chronic thrombocytopenia 6. HLD 7. HTN  DISCUSSION/PLAN:  Patient presents this AM with sudden onset of substernal chest tightness after using cocaine. He does have a history of prior PCI. Last cath in 2012 revealed moderate nonobstructive disease. Myoview in 01/2012 revealed no evidence of ischemia. EKG and initial trop-I WNL. Given history of CAD, will rule out overnight. Stressed cessation of cocaine use. In the meantime, will discontinue BB altogether. Increase ACEi to BID. Continue all other outpatient medications. Place CIWA protocol for precaution. No evidence of DT on exam.   Signed, R. Hurman Horn, PA-C 09/03/2012, 3:48 PM  Agree with assessment and plan as noted by Mr. Arguello PAC.  Today the patient's chest pain was closely temporally related to his use of cocaine last evening.  His electrocardiogram shows no ischemic changes.  His initial troponin levels are normal.  Although he does have a history of moderate nonobstructive disease his most recent  Myoview in 06/13 showed no ischemia.  On examination now he is comfortable and his chest pain has subsided.  Physical examination is unremarkable except for some tremulousness related to his history of heavy chronic alcohol usage.  Because of his use of cocaine we will avoid keeping him on beta blockers and we will continue calcium channel blockers and ACE inhibitors.  Serial enzymes will be obtained overnight and if negative he should be able to go home tomorrow.  Again I spent a good deal of time talking to him about the importance  of avoiding cocaine usage because of its effects on his heart in particular.

## 2012-09-04 DIAGNOSIS — I10 Essential (primary) hypertension: Secondary | ICD-10-CM

## 2012-09-04 DIAGNOSIS — I2510 Atherosclerotic heart disease of native coronary artery without angina pectoris: Secondary | ICD-10-CM

## 2012-09-04 DIAGNOSIS — F1410 Cocaine abuse, uncomplicated: Secondary | ICD-10-CM

## 2012-09-04 LAB — BASIC METABOLIC PANEL
BUN: 7 mg/dL (ref 6–23)
Chloride: 101 mEq/L (ref 96–112)
GFR calc Af Amer: >90 mL/min (ref >90)
GFR calc non Af Amer: 87 mL/min — ABNORMAL LOW (ref >90)
Potassium: 3.8 mEq/L (ref 3.5–5.1)

## 2012-09-04 LAB — CBC
HCT: 38.7 % — ABNORMAL LOW (ref 39.0–52.0)
MCHC: 35.1 g/dL (ref 30.0–36.0)
Platelets: 84 10*3/uL — ABNORMAL LOW (ref 150–400)
RDW: 16.4 % — ABNORMAL HIGH (ref 11.5–15.5)
WBC: 3.7 10*3/uL — ABNORMAL LOW (ref 4.0–10.5)

## 2012-09-04 LAB — URINE CULTURE: Culture: NO GROWTH

## 2012-09-04 LAB — LIPID PANEL
Cholesterol: 196 mg/dL (ref 0–200)
HDL: 61 mg/dL (ref >39)

## 2012-09-04 LAB — TROPONIN I: Troponin I: <0.3 ng/mL (ref <0.30)

## 2012-09-04 NOTE — Progress Notes (Signed)
Utilization Review Completed.  09/04/2012  

## 2012-09-04 NOTE — Progress Notes (Signed)
SUBJECTIVE: The patient is doing well today.  Chest pain has improved.  He denies SOB.     Marland Kitchen amLODipine  10 mg Oral Daily  . aspirin EC  81 mg Oral Daily  . atorvastatin  20 mg Oral q1800  . folic acid  1 mg Oral Daily  . lisinopril  20 mg Oral Daily  . multivitamin with minerals  1 tablet Oral Daily  . pantoprazole  40 mg Oral Daily  . thiamine  100 mg Oral Daily   Or  . thiamine  100 mg Intravenous Daily      OBJECTIVE: Physical Exam: Filed Vitals:   09/04/12 0118 09/04/12 0630 09/04/12 0900 09/04/12 1015  BP: 127/78 148/86  140/94  Pulse: 87 97  102  Temp:  98.7 F (37.1 C)    TempSrc:  Oral    Resp:  20    Height:   5\' 9"  (1.753 m)   Weight:  179 lb (81.194 kg)    SpO2:  93%      Intake/Output Summary (Last 24 hours) at 09/04/12 1239 Last data filed at 09/04/12 1610  Gross per 24 hour  Intake   1236 ml  Output      0 ml  Net   1236 ml    Telemetry reveals sinus rhythm with frequent PACs  GEN- The patient is well appearing, alert and oriented x 3 today.   Head- normocephalic, atraumatic Eyes-  Sclera clear, conjunctiva pink Ears- hearing intact Oropharynx- clear Neck- supple,  Lungs- Clear to ausculation bilaterally, normal work of breathing Heart- Regular rate and rhythm, no murmurs, rubs or gallops, PMI not laterally displaced GI- soft, NT, ND, + BS Extremities- no clubbing, cyanosis, or edema Skin- no rash or lesion Psych- euthymic mood, full affect Neuro- strength and sensation are intact  LABS: Basic Metabolic Panel:  Basename 09/04/12 0530 09/03/12 1228  NA 139 142  K 3.8 3.6  CL 101 106  CO2 24 18*  GLUCOSE 74 81  BUN 7 6  CREATININE 0.97 0.86  CALCIUM 9.5 9.5  MG -- --  PHOS -- --   Liver Function Tests: No results found for this basename: AST:2,ALT:2,ALKPHOS:2,BILITOT:2,PROT:2,ALBUMIN:2 in the last 72 hours No results found for this basename: LIPASE:2,AMYLASE:2 in the last 72 hours CBC:  Basename 09/04/12 0530 09/03/12 1228    WBC 3.7* 2.9*  NEUTROABS -- --  HGB 13.6 15.5  HCT 38.7* 43.6  MCV 91.5 91.2  PLT 84* 87*   Cardiac Enzymes:  Basename 09/04/12 0121 09/03/12 1901  CKTOTAL -- --  CKMB -- --  CKMBINDEX -- --  TROPONINI <0.30 <0.30   BNP: No components found with this basename: POCBNP:3 D-Dimer: No results found for this basename: DDIMER:2 in the last 72 hours Hemoglobin A1C: No results found for this basename: HGBA1C in the last 72 hours Fasting Lipid Panel:  Basename 09/04/12 0530  CHOL 196  HDL 61  LDLCALC 119*  TRIG 82  CHOLHDL 3.2  LDLDIRECT --   Thyroid Function Tests: No results found for this basename: TSH,T4TOTAL,FREET3,T3FREE,THYROIDAB in the last 72 hours Anemia Panel: No results found for this basename: VITAMINB12,FOLATE,FERRITIN,TIBC,IRON,RETICCTPCT in the last 72 hours  RADIOLOGY: Dg Chest 2 View  09/03/2012  *RADIOLOGY REPORT*  Clinical Data: Chest pain, history of MI, cardiac stents  CHEST - 2 VIEW  Comparison: 08/16/2012; 01/29/2011  Findings: Grossly unchanged cardiac silhouette and mediastinal contours with mild tortuosity of the thoracic aorta.  There is unchanged mild diffuse thickening of the pulmonary  interstitium. No definite evidence of pulmonary edema.  No focal airspace opacities.  No pleural effusion or pneumothorax.  Unchanged bones.  IMPRESSION: No acute cardiopulmonary disease.   Original Report Authenticated By: Tacey Ruiz, MD    Dg Chest 2 View  08/16/2012  *RADIOLOGY REPORT*  Clinical Data: Preoperative evaluation for cataract surgery.  CHEST - 2 VIEW  Comparison: 01/29/2011  Findings: The lungs are clear without focal infiltrate, edema, pneumothorax or pleural effusion. Cardiopericardial silhouette is at upper limits of normal for size. Imaged bony structures of the thorax are intact.  IMPRESSION: Stable.  No acute findings.   Original Report Authenticated By: Kennith Center, M.D.      ASSESSMENT:  62 y.o. male with PMHx s/f CAD (s/p prior PCI),  hepatitis C with cirrhosis, chronic resultant thombocytopenia, HTN, HLD and polysubstance abuse who presents to [facility] ED with cocaine induced chest pain.  1. Chest pain in the setting of cocaine use, now improved CMs negative and no ischemic EKG ST/T changes Clearly the intervention at this point is cocaine cessation.  I had a long discussion with him about the importance of cessation.  We will not be able to adequate care for him if he is noncompliant with this. As his CMs are negative, I will stop IV nitroglycerine with plans for discharge perhaps later today. His myoview 6/13 revealed no ischemia.  I would reserve any further CV testing or risk stratification for exertional pain or pain in the absence of cocaine use. 2. CAD s/p prior PCI  3. Polysubstance abuse  As above 4. Hepatitis C with cirrhosis  5. Chronic thrombocytopenia  6. HLD  7. HTN  Consider restarting coreg if he is willing to not use cocaine going forward.  Hillis Range, MD 09/04/2012 12:39 PM

## 2012-09-05 DIAGNOSIS — F17200 Nicotine dependence, unspecified, uncomplicated: Secondary | ICD-10-CM

## 2012-09-05 LAB — GLUCOSE, CAPILLARY: Glucose-Capillary: 104 mg/dL — ABNORMAL HIGH (ref 70–99)

## 2012-09-05 MED ORDER — CARVEDILOL 12.5 MG PO TABS: 12.5000 mg | ORAL_TABLET | Freq: Two times a day (BID) | ORAL | Status: DC

## 2012-09-05 NOTE — Progress Notes (Signed)
Patient: Oma Alpert Date of Encounter: 09/05/2012, 7:43 AM Admit date: 09/03/2012     Subjective  Mr. Everson reports HA this AM but is otherwise without complaint. He denies CP, SOB or palpitations.   Objective  Physical Exam: Vitals: BP 115/83  Pulse 66  Temp 97.8 F (36.6 C) (Oral)  Resp 20  Ht 5\' 9"  (1.753 m)  Wt 179 lb 8 oz (81.421 kg)  BMI 26.51 kg/m2  SpO2 98% General: Well developed, well appearing 62 year old male in no acute distress. Neck: Supple. JVD not elevated. Lungs: Clear bilaterally to auscultation without wheezes, rales, or rhonchi. Breathing is unlabored. Heart: RRR S1 S2 without murmur, rub, or gallop.  Abdomen: Soft, non-distended. Extremities: No clubbing or cyanosis. No edema.  Distal pedal pulses are 2+ and equal bilaterally. Neuro: Alert and oriented X 3. Moves all extremities spontaneously. No focal deficits.  Intake/Output:  Intake/Output Summary (Last 24 hours) at 09/05/12 0743 Last data filed at 09/04/12 1800  Gross per 24 hour  Intake    323 ml  Output      0 ml  Net    323 ml    Inpatient Medications:     . amLODipine  10 mg Oral Daily  . aspirin EC  81 mg Oral Daily  . atorvastatin  20 mg Oral q1800  . folic acid  1 mg Oral Daily  . lisinopril  20 mg Oral Daily  . multivitamin with minerals  1 tablet Oral Daily  . pantoprazole  40 mg Oral Daily  . thiamine  100 mg Oral Daily   Or  . thiamine  100 mg Intravenous Daily    Labs:  Basename 09/04/12 0530 09/03/12 1228  NA 139 142  K 3.8 3.6  CL 101 106  CO2 24 18*  GLUCOSE 74 81  BUN 7 6  CREATININE 0.97 0.86  CALCIUM 9.5 9.5  MG -- --  PHOS -- --    Basename 09/04/12 0530 09/03/12 1228  WBC 3.7* 2.9*  NEUTROABS -- --  HGB 13.6 15.5  HCT 38.7* 43.6  MCV 91.5 91.2  PLT 84* 87*    Basename 09/04/12 0121 09/03/12 1901  CKTOTAL -- --  CKMB -- --  TROPONINI <0.30 <0.30    Basename 09/04/12 0530  CHOL 196  HDL 61  LDLCALC 119*  TRIG 82  CHOLHDL 3.2     Radiology/Studies: Dg Chest 2 View  09/03/2012  *RADIOLOGY REPORT*  Clinical Data: Chest pain, history of MI, cardiac stents  CHEST - 2 VIEW  Comparison: 08/16/2012; 01/29/2011  Findings: Grossly unchanged cardiac silhouette and mediastinal contours with mild tortuosity of the thoracic aorta.  There is unchanged mild diffuse thickening of the pulmonary interstitium. No definite evidence of pulmonary edema.  No focal airspace opacities.  No pleural effusion or pneumothorax.  Unchanged bones.  IMPRESSION: No acute cardiopulmonary disease.   Original Report Authenticated By: Tacey Ruiz, MD     Telemetry: normal sinus rhythm; occasional PVCs; no arrhythmias    Assessment and Plan  62 y.o. male with PMHx s/f CAD (s/p prior PCI), hepatitis C with cirrhosis, chronic resultant thombocytopenia, HTN, HLD and polysubstance abuse who presented to [facility] ED with cocaine induced chest pain.   1. Chest pain in the setting of cocaine use, now improved  CMs negative and no ischemic EKG ST/T changes  Clearly the intervention at this point is cocaine cessation. He has been counseled regarding the importance of cocaine cessation. We  will not be able to adequately care for him if he is noncompliant.  His myoview June 2013 revealed no ischemia. I would reserve any further CV testing or risk stratification for exertional pain or pain in the absence of cocaine use.  2. CAD s/p prior PCI  3. Polysubstance abuse  As above  4. Hepatitis C with cirrhosis  5. Chronic thrombocytopenia  6. HLD  7. HTN   DC to home today.  Signed, EDMISTEN, BROOKE PA-C  I have seen, examined the patient, and reviewed the above assessment and plan.  Changes to above are made where necessary.  Chest pain is resolved.  He is not ready to quit smoking but says he will contemplate this.  He is clear that he will quit cocaine.  He states "I am done with that". He will need to follow-up with Dr Jens Som or an extender in 4-6  weeks.  Co Sign: Hillis Range, MD 09/05/2012 8:14 AM

## 2012-09-05 NOTE — Discharge Summary (Signed)
CARDIOLOGY DISCHARGE SUMMARY    Patient ID: Patrick [name],  MRN: [id number], DOB/AGE: 1951/07/17 62 y.o.  Admit date: 09/03/2012 Discharge date: 09/05/2012  Primary Care Physician: Quitman Livings, MD Primary Cardiologist: Jens Som, MD  Primary Discharge Diagnosis:  1. Chest pain in the setting of cocaine use, now improved  CMs negative and no ischemic EKG ST/T changes  Clearly the intervention at this point is cocaine cessation. He has been counseled regarding the importance of cocaine cessation. We will not be able to adequately care for him if he is noncompliant.  His myoview June 2013 revealed no ischemia. I would reserve any further CV testing or risk stratification for exertional pain or pain in the absence of cocaine use.   Secondary Discharge Diagnoses:  1. CAD s/p prior PCI  2. Polysubstance abuse 3. Hepatitis C with cirrhosis  4. Chronic thrombocytopenia  5. HLD  6. HTN   Procedures This Admission:  None   History and Hospital Course:  Patrick [name] is a 62 year old man with CAD (s/p prior PCI), hepatitis C with cirrhosis, chronic resultant thombocytopenia, HTN, HLD and polysubstance abuse who presents to [facility] ED with chest pain in the setting of cocaine use. His 12-lead ECG revealed NSR with no ST-T wave abnormalities. His CEs were negative. He was kept for observation. He remains hemodynamically stable. Telemetry reveals NSR with occasional PVCs, no arrhythmias. In setting of frequent cocaine use, his BB was discontinued. He was also counseled regarding the importance of cocaine and alcohol cessation. After counseling, he agreed to stop using cocaine. His carvedilol was added back to his regimen. Given that his Myoview in June 2013 revealed no ischemia, we recommend reserving any further CV testing or risk stratification for exertional pain or pain in the absence of cocaine use. Patrick [name] has been seen, examined and deemed stable for discharge today by Dr. Hillis Range.  Discharge Vitals: Blood pressure 115/83, pulse 66, temperature 97.8 F (36.6 C), temperature source Oral, resp. rate 20, height 5\' 9"  (1.753 m), weight 179 lb 8 oz (81.421 kg), SpO2 98.00%.   Labs: Lab Results  Component Value Date   WBC 3.7* 09/04/2012   HGB 13.6 09/04/2012   HCT 38.7* 09/04/2012   MCV 91.5 09/04/2012   PLT 84* 09/04/2012     Lab 09/04/12 0530  NA 139  K 3.8  CL 101  CO2 24  BUN 7  CREATININE 0.97  CALCIUM 9.5  PROT --  BILITOT --  ALKPHOS --  ALT --  AST --  GLUCOSE 74   Lab Results  Component Value Date   CKTOTAL 311* 01/30/2011   CKMB 2.7 01/30/2011   TROPONINI <0.30 09/04/2012    Lab Results  Component Value Date   CHOL 196 09/04/2012   CHOL 169 01/29/2011   Lab Results  Component Value Date   HDL 61 09/04/2012   HDL 52 01/29/2011   Lab Results  Component Value Date   LDLCALC 119* 09/04/2012   LDLCALC 93 01/29/2011   Lab Results  Component Value Date   TRIG 82 09/04/2012   TRIG 119 01/29/2011   Lab Results  Component Value Date   CHOLHDL 3.2 09/04/2012   CHOLHDL 3.3 01/29/2011    Disposition:  The patient is being discharged in stable condition.  Follow-up:     Follow-up Information    Follow up with Olga Millers, MD. In 4 weeks. (For hospital follow-up; Our office will notify you of your appointment date and time)  Contact information:   [address] [place] [address] [phone]       Discharge Medications:    Medication List     As of 09/05/2012  8:15 AM    TAKE these medications         amLODipine 10 MG tablet   Commonly known as: NORVASC   Take 10 mg by mouth daily.      aspirin 81 MG tablet   Take 81 mg by mouth daily.      carvedilol 12.5 MG tablet   Commonly known as: COREG   Take 1 tablet (12.5 mg total) by mouth 2 (two) times daily with a meal.      folic acid 1 MG tablet   Commonly known as: FOLVITE   Take 1 mg by mouth daily.      lisinopril 20 MG tablet   Commonly known as:  PRINIVIL,ZESTRIL   Take 20 mg by mouth daily.      multivitamin with minerals tablet   Take 1 tablet by mouth daily.      nitroGLYCERIN 0.4 MG SL tablet   Commonly known as: NITROSTAT   Place 1 tablet (0.4 mg total) under the tongue every 5 (five) minutes as needed. Up to 3 doses. If pain persist then call 911      omeprazole 20 MG capsule   Commonly known as: PRILOSEC   Take 1 capsule (20 mg total) by mouth daily.      simvastatin 40 MG tablet   Commonly known as: ZOCOR   Take 40 mg by mouth every evening.        Duration of Discharge Encounter: Greater than 30 minutes including physician time.  Limmie Patricia, PA-C 09/05/2012, 8:15 AM   Hillis Range MD

## 2012-09-09 LAB — CULTURE, BLOOD (ROUTINE X 2): Culture: NO GROWTH

## 2012-09-14 NOTE — Pre-Procedure Instructions (Addendum)
Patrick [name]  09/14/2012   Your procedure is scheduled on: Wednesday, February 19th.  Report to [facility] at 9:30AM.  Call this number if you have problems the morning of surgery: [phone]   Remember:   Do not eat food or drink liquids after midnight.    Take these medicines the morning of surgery with A SIP OF WATER: Amlodipine (Norvasc), Carvedilol (Coreg), Omeprazole (Prilosec). Nitroglycerin if needed.   Do not wear jewelry, make-up or nail polish.  Do not wear lotions, powders, or perfumes. You may wear deodorant.  Do not shave 48 hours prior to surgery. Men may shave face and neck.  Do not bring valuables to the hospital.  Contacts, dentures or bridgework may not be worn into surgery.  Leave suitcase in the car. After surgery it may be brought to your room.  For patients admitted to the hospital, checkout time is 11:00 AM the day of discharge.   Patients discharged the day of surgery will not be allowed to drive home.  Name and phone number of your driver: -   Special Instructions: Shower using CHG 2 nights before surgery and the night before surgery.  If you shower the day of surgery use CHG.  Use special wash - you have one bottle of CHG for all showers.  You should use approximately 1/3 of the bottle for each shower.   Please read over the following fact sheets that you were given: Pain Booklet, Coughing and Deep Breathing and Surgical Site Infection Prevention

## 2012-09-15 ENCOUNTER — Encounter (HOSPITAL_COMMUNITY)
Admission: RE | Admit: 2012-09-15 | Discharge: 2012-09-15 | Disposition: A | Discharge status: Home / Self Care | Payer: Medicare Other | Source: Ambulatory Visit | Attending: Ophthalmology | Admitting: Ophthalmology

## 2012-09-15 ENCOUNTER — Encounter (HOSPITAL_COMMUNITY): Payer: Self-pay

## 2012-09-15 LAB — COMPREHENSIVE METABOLIC PANEL
BUN: 8 mg/dL (ref 6–23)
CO2: 28 mEq/L (ref 19–32)
Calcium: 9.7 mg/dL (ref 8.4–10.5)
Chloride: 104 mEq/L (ref 96–112)
Creatinine, Ser: 1 mg/dL (ref 0.50–1.35)
GFR calc Af Amer: >90 mL/min (ref >90)
GFR calc non Af Amer: 79 mL/min — ABNORMAL LOW (ref >90)
Glucose, Bld: 99 mg/dL (ref 70–99)
Total Bilirubin: 0.3 mg/dL (ref 0.3–1.2)

## 2012-09-15 LAB — CBC
Hemoglobin: 13.1 g/dL (ref 13.0–17.0)
MCH: 31.3 pg (ref 26.0–34.0)
Platelets: DECREASED 10*3/uL (ref 150–400)
RBC: 4.18 MIL/uL — ABNORMAL LOW (ref 4.22–5.81)
WBC: 3.1 10*3/uL — ABNORMAL LOW (ref 4.0–10.5)

## 2012-09-15 NOTE — Progress Notes (Signed)
This patient has screened at an elevated risk for obstructive sleep apnea using the STOP Band tool during a pre surgery visit.

## 2012-09-21 MED ORDER — GATIFLOXACIN 0.5 % OP SOLN
1.0000 [drp] | OPHTHALMIC | Status: AC | PRN
  Administered 2012-09-22 (×3): 1 [drp] via OPHTHALMIC
  Filled 2012-09-21: qty 2.5

## 2012-09-21 MED ORDER — PHENYLEPHRINE HCL 2.5 % OP SOLN
1.0000 [drp] | OPHTHALMIC | Status: AC | PRN
  Administered 2012-09-22 (×3): 1 [drp] via OPHTHALMIC
  Filled 2012-09-21: qty 3

## 2012-09-21 MED ORDER — TETRACAINE HCL 0.5 % OP SOLN
2.0000 [drp] | OPHTHALMIC | Status: AC
  Administered 2012-09-22: 2 [drp] via OPHTHALMIC
  Filled 2012-09-21: qty 2

## 2012-09-21 MED ORDER — PREDNISOLONE ACETATE 1 % OP SUSP
1.0000 [drp] | OPHTHALMIC | Status: AC
  Administered 2012-09-22: 1 [drp] via OPHTHALMIC
  Filled 2012-09-21: qty 5

## 2012-09-21 NOTE — Progress Notes (Signed)
Pt notified of time change;to arrive at 0900 

## 2012-09-21 NOTE — Progress Notes (Signed)
1254  Pt has been notified that surgery time has been moved to 10:16am and that he will need to arrive at 8:16AM..Marland KitchenDA

## 2012-09-22 ENCOUNTER — Encounter (HOSPITAL_COMMUNITY): Payer: Self-pay | Admitting: Anesthesiology

## 2012-09-22 ENCOUNTER — Ambulatory Visit (HOSPITAL_COMMUNITY): Payer: Medicare Other | Admitting: Anesthesiology

## 2012-09-22 ENCOUNTER — Encounter (HOSPITAL_COMMUNITY)
Admission: RE | Disposition: A | Discharge status: Home / Self Care | Payer: Self-pay | Source: Ambulatory Visit | Attending: Ophthalmology

## 2012-09-22 ENCOUNTER — Ambulatory Visit (HOSPITAL_COMMUNITY)
Admission: RE | Admit: 2012-09-22 | Discharge: 2012-09-22 | Disposition: A | Discharge status: Home / Self Care | Payer: Medicare Other | Source: Ambulatory Visit | Attending: Ophthalmology | Admitting: Ophthalmology

## 2012-09-22 DIAGNOSIS — H2510 Age-related nuclear cataract, unspecified eye: Secondary | ICD-10-CM | POA: Insufficient documentation

## 2012-09-22 DIAGNOSIS — M129 Arthropathy, unspecified: Secondary | ICD-10-CM | POA: Insufficient documentation

## 2012-09-22 DIAGNOSIS — E785 Hyperlipidemia, unspecified: Secondary | ICD-10-CM | POA: Insufficient documentation

## 2012-09-22 DIAGNOSIS — F17200 Nicotine dependence, unspecified, uncomplicated: Secondary | ICD-10-CM | POA: Insufficient documentation

## 2012-09-22 DIAGNOSIS — I10 Essential (primary) hypertension: Secondary | ICD-10-CM | POA: Insufficient documentation

## 2012-09-22 DIAGNOSIS — K219 Gastro-esophageal reflux disease without esophagitis: Secondary | ICD-10-CM | POA: Insufficient documentation

## 2012-09-22 DIAGNOSIS — I2510 Atherosclerotic heart disease of native coronary artery without angina pectoris: Secondary | ICD-10-CM | POA: Insufficient documentation

## 2012-09-22 HISTORY — PX: CATARACT EXTRACTION W/PHACO: SHX586

## 2012-09-22 SURGERY — PHACOEMULSIFICATION, CATARACT, WITH IOL INSERTION
Anesthesia: General | Site: Eye | Laterality: Left | Wound class: Clean

## 2012-09-22 MED ORDER — SODIUM CHLORIDE 0.9 % IV SOLN
INTRAVENOUS | Status: DC
  Administered 2012-09-22 (×2): via INTRAVENOUS

## 2012-09-22 MED ORDER — LIDOCAINE HCL (PF) 2 % IJ SOLN: INTRAMUSCULAR | Status: DC | PRN

## 2012-09-22 MED ORDER — NA CHONDROIT SULF-NA HYALURON 40-30 MG/ML IO SOLN
INTRAOCULAR | Status: DC | PRN
  Administered 2012-09-22: 0.5 mL via INTRAOCULAR

## 2012-09-22 MED ORDER — LIDOCAINE HCL (CARDIAC) 20 MG/ML IV SOLN
INTRAVENOUS | Status: DC | PRN
  Administered 2012-09-22: 80 mg via INTRAVENOUS

## 2012-09-22 MED ORDER — CEFAZOLIN SUBCONJUNCTIVAL INJECTION 100 MG/0.5 ML
200.0000 mg | INJECTION | SUBCONJUNCTIVAL | Status: DC
  Filled 2012-09-22: qty 1

## 2012-09-22 MED ORDER — SUCCINYLCHOLINE CHLORIDE 20 MG/ML IJ SOLN
INTRAMUSCULAR | Status: DC | PRN
  Administered 2012-09-22: 120 mg via INTRAVENOUS

## 2012-09-22 MED ORDER — FENTANYL CITRATE 0.05 MG/ML IJ SOLN: 25.0000 ug | INTRAMUSCULAR | Status: DC | PRN

## 2012-09-22 MED ORDER — NA CHONDROIT SULF-NA HYALURON 40-30 MG/ML IO SOLN
INTRAOCULAR | Status: AC
  Filled 2012-09-22: qty 0.5

## 2012-09-22 MED ORDER — PROPOFOL 10 MG/ML IV BOLUS
INTRAVENOUS | Status: DC | PRN
  Administered 2012-09-22: 140 mg via INTRAVENOUS

## 2012-09-22 MED ORDER — HYPROMELLOSE (GONIOSCOPIC) 2.5 % OP SOLN
OPHTHALMIC | Status: DC | PRN
  Administered 2012-09-22: 2 [drp] via OPHTHALMIC

## 2012-09-22 MED ORDER — EPINEPHRINE HCL 1 MG/ML IJ SOLN
INTRAOCULAR | Status: DC | PRN
  Administered 2012-09-22: 11:00:00

## 2012-09-22 MED ORDER — BUPIVACAINE HCL (PF) 0.75 % IJ SOLN
INTRAMUSCULAR | Status: AC
  Filled 2012-09-22: qty 10

## 2012-09-22 MED ORDER — ONDANSETRON HCL 4 MG/2ML IJ SOLN
INTRAMUSCULAR | Status: DC | PRN
  Administered 2012-09-22: 4 mg via INTRAVENOUS

## 2012-09-22 MED ORDER — MIDAZOLAM HCL 5 MG/5ML IJ SOLN
INTRAMUSCULAR | Status: DC | PRN
  Administered 2012-09-22: 2 mg via INTRAVENOUS

## 2012-09-22 MED ORDER — BUPIVACAINE HCL (PF) 0.75 % IJ SOLN
INTRAMUSCULAR | Status: DC | PRN
  Administered 2012-09-22: 4 mL

## 2012-09-22 MED ORDER — EPHEDRINE SULFATE 50 MG/ML IJ SOLN
INTRAMUSCULAR | Status: DC | PRN
  Administered 2012-09-22: 5 mg via INTRAVENOUS

## 2012-09-22 MED ORDER — LIDOCAINE HCL 2 % IJ SOLN
INTRAMUSCULAR | Status: AC
  Filled 2012-09-22: qty 20

## 2012-09-22 MED ORDER — DEXAMETHASONE SODIUM PHOSPHATE 10 MG/ML IJ SOLN
INTRAMUSCULAR | Status: DC | PRN
  Administered 2012-09-22: 10 mg

## 2012-09-22 MED ORDER — FENTANYL CITRATE 0.05 MG/ML IJ SOLN
INTRAMUSCULAR | Status: DC | PRN
  Administered 2012-09-22 (×2): 50 ug via INTRAVENOUS

## 2012-09-22 MED ORDER — BACITRACIN-POLYMYXIN B 500-10000 UNIT/GM OP OINT
TOPICAL_OINTMENT | OPHTHALMIC | Status: AC
  Filled 2012-09-22: qty 3.5

## 2012-09-22 MED ORDER — EPINEPHRINE HCL 1 MG/ML IJ SOLN
INTRAMUSCULAR | Status: AC
  Filled 2012-09-22: qty 1

## 2012-09-22 MED ORDER — TRIAMCINOLONE ACETONIDE 40 MG/ML IJ SUSP
INTRAMUSCULAR | Status: AC
  Filled 2012-09-22: qty 5

## 2012-09-22 MED ORDER — HYPROMELLOSE (GONIOSCOPIC) 2.5 % OP SOLN
OPHTHALMIC | Status: AC
  Filled 2012-09-22: qty 15

## 2012-09-22 MED ORDER — BSS IO SOLN
INTRAOCULAR | Status: AC
  Filled 2012-09-22: qty 500

## 2012-09-22 MED ORDER — DEXAMETHASONE SODIUM PHOSPHATE 10 MG/ML IJ SOLN
INTRAMUSCULAR | Status: AC
  Filled 2012-09-22: qty 1

## 2012-09-22 MED ORDER — PROVISC 10 MG/ML IO SOLN
INTRAOCULAR | Status: DC | PRN
  Administered 2012-09-22: 8.5 mg via INTRAOCULAR

## 2012-09-22 MED ORDER — BACITRACIN-POLYMYXIN B 500-10000 UNIT/GM OP OINT
TOPICAL_OINTMENT | OPHTHALMIC | Status: DC | PRN
  Administered 2012-09-22: 1 via OPHTHALMIC

## 2012-09-22 SURGICAL SUPPLY — 58 items
APPLICATOR COTTON TIP 6IN STRL (MISCELLANEOUS) ×2 IMPLANT
APPLICATOR DR MATTHEWS STRL (MISCELLANEOUS) ×2 IMPLANT
BAG MINI COLL DRAIN (WOUND CARE) ×2 IMPLANT
BLADE EYE MINI 60D BEAVER (BLADE) IMPLANT
BLADE KERATOME 2.75 (BLADE) ×2 IMPLANT
BLADE STAB KNIFE 15DEG (BLADE) IMPLANT
CANNULA ANTERIOR CHAMBER 27GA (MISCELLANEOUS) IMPLANT
CLOTH BEACON ORANGE TIMEOUT ST (SAFETY) ×2 IMPLANT
DRAPE OPHTHALMIC 77X100 STRL (CUSTOM PROCEDURE TRAY) ×2 IMPLANT
DRAPE POUCH INSTRU U-SHP 10X18 (DRAPES) ×2 IMPLANT
DRSG TEGADERM 4X4.75 (GAUZE/BANDAGES/DRESSINGS) ×2 IMPLANT
FILTER BLUE MILLIPORE (MISCELLANEOUS) IMPLANT
GLOVE SS BIOGEL STRL SZ 6.5 (GLOVE) ×1 IMPLANT
GLOVE SUPERSENSE BIOGEL SZ 6.5 (GLOVE) ×1
GOWN SRG XL XLNG 56XLVL 4 (GOWN DISPOSABLE) ×1 IMPLANT
GOWN STRL NON-REIN LRG LVL3 (GOWN DISPOSABLE) ×2 IMPLANT
GOWN STRL NON-REIN XL XLG LVL4 (GOWN DISPOSABLE) ×1
KIT BASIN OR (CUSTOM PROCEDURE TRAY) ×2 IMPLANT
KIT ROOM TURNOVER OR (KITS) IMPLANT
KNIFE GRIESHABER SHARP 2.5MM (MISCELLANEOUS) ×2 IMPLANT
LENS IOL ACRYSOF MP POST 18.5 (Intraocular Lens) ×2 IMPLANT
MASK EYE SHIELD (GAUZE/BANDAGES/DRESSINGS) ×2 IMPLANT
NEEDLE 18GX1X1/2 (RX/OR ONLY) (NEEDLE) IMPLANT
NEEDLE 22X1 1/2 (OR ONLY) (NEEDLE) ×2 IMPLANT
NEEDLE 25GX 5/8IN NON SAFETY (NEEDLE) ×2 IMPLANT
NEEDLE FILTER BLUNT 18X 1/2SAF (NEEDLE)
NEEDLE FILTER BLUNT 18X1 1/2 (NEEDLE) IMPLANT
NEEDLE HYPO 30X.5 LL (NEEDLE) ×4 IMPLANT
NS IRRIG 1000ML POUR BTL (IV SOLUTION) ×2 IMPLANT
PACK CATARACT CUSTOM (CUSTOM PROCEDURE TRAY) ×2 IMPLANT
PACK CATARACT MCHSCP (PACKS) ×2 IMPLANT
PACK COMBINED CATERACT/VIT 23G (OPHTHALMIC RELATED) IMPLANT
PAD ARMBOARD 7.5X6 YLW CONV (MISCELLANEOUS) ×4 IMPLANT
PAD EYE OVAL STERILE LF (GAUZE/BANDAGES/DRESSINGS) ×2 IMPLANT
PHACO TIP KELMAN 45DEG (TIP) ×2 IMPLANT
PROBE ANTERIOR 20G W/INFUS NDL (MISCELLANEOUS) IMPLANT
RING MALYGIN (MISCELLANEOUS) IMPLANT
ROLLS DENTAL (MISCELLANEOUS) ×4 IMPLANT
SHUTTLE MONARCH TYPE A (NEEDLE) ×2 IMPLANT
SOLUTION ANTI FOG 6CC (MISCELLANEOUS) IMPLANT
SPEAR EYE SURG WECK-CEL (MISCELLANEOUS) ×2 IMPLANT
SUT ETHILON 10-0 CS-B-6CS-B-6 (SUTURE)
SUT ETHILON 5 0 P 3 18 (SUTURE)
SUT ETHILON 9 0 TG140 8 (SUTURE) IMPLANT
SUT NYLON ETHILON 5-0 P-3 1X18 (SUTURE) IMPLANT
SUT PLAIN 6 0 TG1408 (SUTURE) IMPLANT
SUT POLY NON ABSORB 10-0 8 STR (SUTURE) IMPLANT
SUT VICRYL 6 0 S 29 12 (SUTURE) IMPLANT
SUTURE EHLN 10-0 CS-B-6CS-B-6 (SUTURE) IMPLANT
SYR 20CC LL (SYRINGE) IMPLANT
SYR 5ML LL (SYRINGE) IMPLANT
SYR TB 1ML LUER SLIP (SYRINGE) IMPLANT
SYRINGE 10CC LL (SYRINGE) IMPLANT
TIP ABS 45DEG FLARED 0.9MM (TIP) ×2 IMPLANT
TOWEL OR 17X24 6PK STRL BLUE (TOWEL DISPOSABLE) ×4 IMPLANT
WATER STERILE IRR 1000ML POUR (IV SOLUTION) ×2 IMPLANT
WIPE INSTRUMENT ADHESIVE BACK (MISCELLANEOUS) ×2 IMPLANT
WIPE INSTRUMENT VISIWIPE 73X73 (MISCELLANEOUS) ×2 IMPLANT

## 2012-09-22 NOTE — Transfer of Care (Signed)
Immediate Anesthesia Transfer of Care Note  Patient: Patrick [name]  Procedure(s) Performed: Procedure(s): CATARACT EXTRACTION PHACO AND INTRAOCULAR LENS PLACEMENT (IOC) (Left)  Patient Location: PACU  Anesthesia Type:General  Level of Consciousness: awake, alert  and oriented  Airway & Oxygen Therapy: Patient Spontanous Breathing  Post-op Assessment: Report given to PACU RN and Post -op Vital signs reviewed and stable  Post vital signs: Reviewed and stable  Complications: No apparent anesthesia complications

## 2012-09-22 NOTE — H&P (Signed)
Pre-operative History and Physical for Ophthalmic Surgery  Patrick [name] 09/22/2012                  Chief Complaint: Decreased vision      Va: 20/60  Diagnosis:  Combined Cataract Left Eye  No Known Allergies  Prior to Admission medications   Medication Sig Start Date End Date Taking? Authorizing Provider  amLODipine (NORVASC) 10 MG tablet Take 10 mg by mouth daily.     Yes Historical Provider, MD  aspirin 81 MG tablet Take 81 mg by mouth daily.     Yes Historical Provider, MD  carvedilol (COREG) 12.5 MG tablet Take 1 tablet (12.5 mg total) by mouth 2 (two) times daily with a meal. 09/05/12  Yes Brooke O Edmisten, PA-C  folic acid (FOLVITE) 1 MG tablet Take 1 mg by mouth daily.     Yes Historical Provider, MD  HYDROcodone-acetaminophen (NORCO/VICODIN) 5-325 MG per tablet Take 1 tablet by mouth every 8 (eight) hours as needed for pain.   Yes Historical Provider, MD  lisinopril (PRINIVIL,ZESTRIL) 20 MG tablet Take 20 mg by mouth daily.     Yes Historical Provider, MD  nitroGLYCERIN (NITROSTAT) 0.4 MG SL tablet Place 1 tablet (0.4 mg total) under the tongue every 5 (five) minutes as needed. Up to 3 doses. If pain persist then call 911 01/20/12  Yes Lewayne Bunting, MD  omeprazole (PRILOSEC) 20 MG capsule Take 1 capsule (20 mg total) by mouth daily. 11/18/11 11/17/12 Yes Iva Boop, MD  prednisoLONE acetate (PRED FORTE) 1 % ophthalmic suspension Place 1 drop into the right eye 4 (four) times daily.   Yes Historical Provider, MD  simvastatin (ZOCOR) 40 MG tablet Take 40 mg by mouth every evening.   Yes Historical Provider, MD    Planned Procedure:                                       Phacoemulsification, Posterior Chamber Intra-ocular Lens Left Eye                                       Acrysof MA50BM + 18.50 Diopter PC IOL for implant OS   Filed Vitals:   09/22/12 1012  BP: 154/98  Pulse: 90  Temp:   Resp: 20    Past Medical History  Diagnosis Date  . MI (myocardial infarction)      x3, jan, aug, nov 2011; treated at [facility] in [place]  . Sexual dysfunction   . Arthritis   . HTN (hypertension)   . Hyperlipidemia   . Chronic chest pain   . CAD (coronary artery disease)   . Thrombocytopenia   . Cataract   . SSBE (short-segment Barrett's esophagus) 11/11/11  . Colon adenomas 11/11/11    x 5, one rectal tv adenoma  . GERD (gastroesophageal reflux disease)   . 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 Barrett's esophagus 11/17/2011    Short-segment dx 11/2011   . Alcoholism 01/02/2012    Past Surgical History  Procedure Laterality Date  . Hernia repair      very young  . Balloon angioplasty, artery  03/2010  . Colonoscopy  11/11/11    Dr. Stan Head  . Esophagogastroduodenoscopy  11/11/11    Dr. Stan Head  . Cardiac catheterization  01/31/2011  .  Coronary angioplasty    . Cataract extraction w/phaco  08/18/2012    Procedure: CATARACT EXTRACTION PHACO AND INTRAOCULAR LENS PLACEMENT (IOC);  Surgeon: [name], MD;  Location: [facility] OR;  Service: Ophthalmology;  Laterality: Right;     History   Social History  . Marital Status: Single    Spouse Name: N/A    Number of Children: 3  . Years of Education: N/A   Occupational History  .      Unemployed   Social History Main Topics  . Smoking status: Current Every Day Smoker -- 0.25 packs/day for 45 years    Types: Cigarettes  . Smokeless tobacco: Never Used  . Alcohol Use: Yes  . Drug Use: No     Comment: Cocaine + 08/2012. Previous heroin, cocaine and marijuana abuse.   Marland Kitchen Sexually Active: Not on file   Other Topics Concern  . Not on file   Social History Narrative  . No narrative on file     The following examination is for anesthesia clearance for minimally invasive Ophthalmic surgery. It is primarily to document heart and lung findings and is not intended to elucidate unknown general medical conditions inclusive of abdominal masses, lung lesions, etc.   General Constitution:  within normal  limits   Alertness/Orientation:  Person, time place     yes   HEENT:  Eye Findings:  Combined Cataract                   left eye  Neck: supple without masses  Chest/Lungs: clear to auscultation  Cardiac: Normal S1 and S2 without Murmur, S3 or S4  Neuro: non-focal  Impression:  Combined Cataract Left Eye  Planned Procedure:  Phacoemulsification, Posterior Chamber Intraocular Lens  Left Eye    [name], MD

## 2012-09-22 NOTE — Preoperative (Signed)
Beta Blockers   Reason not to administer Beta Blockers:Not Applicable 

## 2012-09-22 NOTE — Op Note (Signed)
Patrick [name] 09/22/2012 Cataract: Combined, Nuclear  Procedure: Phacoemulsification, Posterior Chamber Intra-ocular Lens Operative Eye:  left eye  Surgeon: [name] Estimated Blood Loss: minimal Specimens for Pathology:  None Complications: none  The patient was prepared and draped in the usual manner for ocular surgery on the left eye. A Cook lid speculum was placed. A peripheral clear corneal incision was made at the surgical limbus centered at the 11:00 meridian. A separate clear corneal stab incision was made with a 15 degree blade at the 2:00 meridian to permit bi-manual technique. Viscoat and  Provisc as an underlying layer next to the capsule was instilled into the anterior chamber through that incision.  A keratome was used to create a self sealing incision entering the anterior chamber at the 11:00 meridian. A capsulorhexis was performed using a bent 25g needle. The lens was hydrodissected and the nucleus was hydrodilineated using a Nichammin cannula. The Chang chopper was inserted and used to rotate the lens to insure adequate lens mobility. The phacoemulsification handpiece was inserted and a combined phaco-chop technique was employed, fracturing the lens into separate sections with subsequent removal with the phaco handpiece.   The I/A cannula was used to remove remaining lens cortex. Provisc was instilled and used to deepen the anterior chamber and posterior capsule bag. The Monarch injector was used to place a folded Acrysof MA50BM PC IOL, + 18.50  diopters, into the capsule bag. A Sinskey lens hook was used to dial in the trailing haptic.  The I/A cannula was used to remove the viscoelastic from the anterior chamber. BSS was used to bring IOP to the desired range and the wound was checked to insure it was watertight. Subconjunctival injections of Ancef 100/0.19ml and Dexamethasone 0.5 ml of a 10mg /67ml solution were placed without complication. The lid speculum and drapes were  removed and the patient's eye was patched with Polymixin/Bacitracin ophthalmic ointment. An eye shield was placed and the patient was transferred alert and conversant from the operating room to the post-operative recovery area.   [name], MD

## 2012-09-22 NOTE — Anesthesia Preprocedure Evaluation (Addendum)
Anesthesia Evaluation  Patient identified by MRN, date of birth, ID band Patient awake    Reviewed: Allergy & Precautions, H&P , NPO status , Patient's Chart, lab work & pertinent test results  History of Anesthesia Complications Negative for: history of anesthetic complications  Airway Mallampati: I TM Distance: >3 FB Neck ROM: Full    Dental  (+) Edentulous Upper and Dental Advisory Given   Pulmonary Current Smoker,  + rhonchi         Cardiovascular hypertension, Pt. on medications and Pt. on home beta blockers + CAD and + Past MI Rhythm:Regular Rate:Normal     Neuro/Psych PSYCHIATRIC DISORDERS Anxiety negative neurological ROS     GI/Hepatic GERD-  Medicated and Controlled,(+) Hepatitis -, C  Endo/Other  negative endocrine ROS  Renal/GU negative Renal ROS     Musculoskeletal negative musculoskeletal ROS (+)   Abdominal   Peds  Hematology negative hematology ROS (+)   Anesthesia Other Findings   Reproductive/Obstetrics negative OB ROS                         Anesthesia Physical Anesthesia Plan  ASA: III  Anesthesia Plan: General   Post-op Pain Management:    Induction: Intravenous  Airway Management Planned: Oral ETT  Additional Equipment:   Intra-op Plan:   Post-operative Plan: Extubation in OR  Informed Consent: I have reviewed the patients History and Physical, chart, labs and discussed the procedure including the risks, benefits and alternatives for the proposed anesthesia with the patient or authorized representative who has indicated his/her understanding and acceptance.     Plan Discussed with: CRNA and Surgeon  Anesthesia Plan Comments:         Anesthesia Quick Evaluation

## 2012-09-22 NOTE — Anesthesia Postprocedure Evaluation (Signed)
  Anesthesia Post-op Note  Patient: Patrick [name]  Procedure(s) Performed: Procedure(s): CATARACT EXTRACTION PHACO AND INTRAOCULAR LENS PLACEMENT (IOC) (Left)  Patient Location: PACU  Anesthesia Type:General  Level of Consciousness: awake and alert   Airway and Oxygen Therapy: Patient Spontanous Breathing  Post-op Pain: mild  Post-op Assessment: Post-op Vital signs reviewed  Post-op Vital Signs: stable  Complications: No apparent anesthesia complications

## 2012-09-24 ENCOUNTER — Encounter (HOSPITAL_COMMUNITY): Payer: Self-pay | Admitting: Ophthalmology

## 2012-10-01 ENCOUNTER — Encounter: Payer: Medicare Other | Admitting: Cardiology

## 2012-10-04 ENCOUNTER — Encounter: Payer: Medicare Other | Admitting: Nurse Practitioner

## 2012-10-08 ENCOUNTER — Emergency Department (HOSPITAL_COMMUNITY)
Admission: EM | Admit: 2012-10-08 | Discharge: 2012-10-08 | Disposition: A | Discharge status: Home / Self Care | Payer: Medicare Other | Attending: Emergency Medicine | Admitting: Emergency Medicine

## 2012-10-08 ENCOUNTER — Emergency Department (HOSPITAL_COMMUNITY): Payer: Medicare Other

## 2012-10-08 ENCOUNTER — Encounter (HOSPITAL_COMMUNITY): Payer: Self-pay

## 2012-10-08 DIAGNOSIS — Z8719 Personal history of other diseases of the digestive system: Secondary | ICD-10-CM | POA: Insufficient documentation

## 2012-10-08 DIAGNOSIS — Z79899 Other long term (current) drug therapy: Secondary | ICD-10-CM | POA: Insufficient documentation

## 2012-10-08 DIAGNOSIS — F1020 Alcohol dependence, uncomplicated: Secondary | ICD-10-CM | POA: Insufficient documentation

## 2012-10-08 DIAGNOSIS — R079 Chest pain, unspecified: Secondary | ICD-10-CM | POA: Insufficient documentation

## 2012-10-08 DIAGNOSIS — K219 Gastro-esophageal reflux disease without esophagitis: Secondary | ICD-10-CM | POA: Insufficient documentation

## 2012-10-08 DIAGNOSIS — D696 Thrombocytopenia, unspecified: Secondary | ICD-10-CM | POA: Insufficient documentation

## 2012-10-08 DIAGNOSIS — Z8619 Personal history of other infectious and parasitic diseases: Secondary | ICD-10-CM | POA: Insufficient documentation

## 2012-10-08 DIAGNOSIS — F17200 Nicotine dependence, unspecified, uncomplicated: Secondary | ICD-10-CM | POA: Insufficient documentation

## 2012-10-08 DIAGNOSIS — I252 Old myocardial infarction: Secondary | ICD-10-CM | POA: Insufficient documentation

## 2012-10-08 DIAGNOSIS — Z8659 Personal history of other mental and behavioral disorders: Secondary | ICD-10-CM | POA: Insufficient documentation

## 2012-10-08 DIAGNOSIS — G8929 Other chronic pain: Secondary | ICD-10-CM | POA: Insufficient documentation

## 2012-10-08 DIAGNOSIS — I10 Essential (primary) hypertension: Secondary | ICD-10-CM | POA: Insufficient documentation

## 2012-10-08 DIAGNOSIS — E785 Hyperlipidemia, unspecified: Secondary | ICD-10-CM | POA: Insufficient documentation

## 2012-10-08 DIAGNOSIS — Z85038 Personal history of other malignant neoplasm of large intestine: Secondary | ICD-10-CM | POA: Insufficient documentation

## 2012-10-08 DIAGNOSIS — I2510 Atherosclerotic heart disease of native coronary artery without angina pectoris: Secondary | ICD-10-CM | POA: Insufficient documentation

## 2012-10-08 DIAGNOSIS — Z9849 Cataract extraction status, unspecified eye: Secondary | ICD-10-CM | POA: Insufficient documentation

## 2012-10-08 DIAGNOSIS — Z9861 Coronary angioplasty status: Secondary | ICD-10-CM | POA: Insufficient documentation

## 2012-10-08 DIAGNOSIS — Z8739 Personal history of other diseases of the musculoskeletal system and connective tissue: Secondary | ICD-10-CM | POA: Insufficient documentation

## 2012-10-08 DIAGNOSIS — D72819 Decreased white blood cell count, unspecified: Secondary | ICD-10-CM | POA: Insufficient documentation

## 2012-10-08 DIAGNOSIS — IMO0002 Reserved for concepts with insufficient information to code with codable children: Secondary | ICD-10-CM | POA: Insufficient documentation

## 2012-10-08 DIAGNOSIS — Z7982 Long term (current) use of aspirin: Secondary | ICD-10-CM | POA: Insufficient documentation

## 2012-10-08 LAB — CBC
HCT: 39.3 % (ref 39.0–52.0)
MCH: 32.1 pg (ref 26.0–34.0)
MCHC: 36.6 g/dL — ABNORMAL HIGH (ref 30.0–36.0)
RDW: 15.1 % (ref 11.5–15.5)

## 2012-10-08 LAB — BASIC METABOLIC PANEL
BUN: 5 mg/dL — ABNORMAL LOW (ref 6–23)
Calcium: 9.3 mg/dL (ref 8.4–10.5)
Creatinine, Ser: 0.86 mg/dL (ref 0.50–1.35)
GFR calc Af Amer: >90 mL/min (ref >90)
GFR calc non Af Amer: >90 mL/min (ref >90)
Potassium: 3.3 mEq/L — ABNORMAL LOW (ref 3.5–5.1)

## 2012-10-08 MED ORDER — ASPIRIN 81 MG PO CHEW
324.0000 mg | CHEWABLE_TABLET | Freq: Once | ORAL | Status: AC
  Administered 2012-10-08: 324 mg via ORAL
  Filled 2012-10-08: qty 4

## 2012-10-08 MED ORDER — NAPROXEN 500 MG PO TABS: 500.0000 mg | ORAL_TABLET | Freq: Two times a day (BID) | ORAL | Status: DC

## 2012-10-08 NOTE — ED Provider Notes (Signed)
History     CSN: [id number]  Arrival date & time 10/08/12  1141   First MD Initiated Contact with Patient 10/08/12 1151      Chief Complaint  Patient presents with  . Chest Pain    (Consider location/radiation/quality/duration/timing/severity/associated sxs/prior treatment) HPI Comments: 62 year old male who presents to the hospital with recurrent chest pain. The patient was recently admitted to the hospital to cardiology service at which time he was evaluated by the cardiologist, in the setting of cocaine associated chest pain he had no ischemic EKG changes, he was counseled on his drug abuse and of note had a normal stress Myoview which was performed in June of 2013 according to the cardiology notes. He was also noted to have occasional PVCs but no arrhythmias on her monitoring. They recommended that there is no further cardiology intervention is needed to happen unless the patient had more traditional anginal type pains. Today the patient presents with chest pains that are sternal and just left of sternal, he describes them as cramping and then a pressure or heavy feeling. He denies that this pain radiates to me, he denies shortness of breath coughing fevers numbness swelling rashes or any other complaints. He does report being slightly dizzy this morning, he has not had anything to eat or drink today. He states that his symptoms started while he was trying to pull the snow off of his car. He was not walking, he was not exert himself otherwise, he was not shoveling snow. He was using his hand to clear his windshield and his hood  Patient is a 62 y.o. male presenting with chest pain. The history is provided by the patient and medical records.  Chest Pain   Past Medical History  Diagnosis Date  . MI (myocardial infarction)     x3, jan, aug, nov 2011; treated at [facility] in [place]  . Sexual dysfunction   . Arthritis   . HTN (hypertension)   . Hyperlipidemia   . Chronic chest pain   . CAD  (coronary artery disease)   . Thrombocytopenia   . Cataract   . SSBE (short-segment Barrett's esophagus) 11/11/11  . Colon adenomas 11/11/11    x 5, one rectal tv adenoma  . GERD (gastroesophageal reflux disease)   . 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 Barrett's esophagus 11/17/2011    Short-segment dx 11/2011   . Alcoholism 01/02/2012    Past Surgical History  Procedure Laterality Date  . Hernia repair      very young  . Balloon angioplasty, artery  03/2010  . Colonoscopy  11/11/11    Dr. Stan Head  . Esophagogastroduodenoscopy  11/11/11    Dr. Stan Head  . Cardiac catheterization  01/31/2011  . Coronary angioplasty    . Cataract extraction w/phaco  08/18/2012    Procedure: CATARACT EXTRACTION PHACO AND INTRAOCULAR LENS PLACEMENT (IOC);  Surgeon: [name], MD;  Location[facility] OR;  Service: Ophthalmology;  Laterality: Right;  . Cataract extraction w/phaco Left 09/22/2012    Procedure: CATARACT EXTRACTION PHACO AND INTRAOCULAR LENS PLACEMENT (IOC);  Surgeon: [name], MD;  Location: [facility] OR;  Service: Ophthalmology;  Laterality: Left;    Family History  Problem Relation Age of Onset  . Cancer      mother's side/fathers side, 2 uncles  . Heart attack Maternal Grandfather   . Diabetes Sister   . Stroke Sister   . Colon cancer Neg Hx   . Esophageal cancer Neg Hx   . Rectal  cancer Neg Hx   . Stomach cancer Neg Hx     History  Substance Use Topics  . Smoking status: Current Every Day Smoker -- 0.25 packs/day for 45 years    Types: Cigarettes  . Smokeless tobacco: Never Used  . Alcohol Use: Yes      Review of Systems  Cardiovascular: Positive for chest pain.  All other systems reviewed and are negative.    Allergies  Review of patient's allergies indicates no known allergies.  Home Medications   Current Outpatient Rx  Name  Route  Sig  Dispense  Refill  . amLODipine (NORVASC) 10 MG tablet   Oral   Take 10 mg by mouth daily.           Marland Kitchen aspirin  81 MG tablet   Oral   Take 81 mg by mouth daily.           . carvedilol (COREG) 12.5 MG tablet   Oral   Take 12.5 mg by mouth 2 (two) times daily with a meal.         . folic acid (FOLVITE) 1 MG tablet   Oral   Take 1 mg by mouth daily.           Marland Kitchen HYDROcodone-acetaminophen (NORCO/VICODIN) 5-325 MG per tablet   Oral   Take 1 tablet by mouth every 8 (eight) hours as needed for pain.         Marland Kitchen lisinopril (PRINIVIL,ZESTRIL) 20 MG tablet   Oral   Take 20 mg by mouth daily.           . nitroGLYCERIN (NITROSTAT) 0.4 MG SL tablet   Sublingual   Place 0.4 mg under the tongue every 5 (five) minutes as needed for chest pain. Up to 3 doses. If pain persist then call 911         . omeprazole (PRILOSEC) 20 MG capsule   Oral   Take 1 capsule (20 mg total) by mouth daily.   30 capsule   11   . prednisoLONE acetate (PRED FORTE) 1 % ophthalmic suspension   Right Eye   Place 1 drop into the right eye 4 (four) times daily.         . simvastatin (ZOCOR) 40 MG tablet   Oral   Take 40 mg by mouth every evening.         . naproxen (NAPROSYN) 500 MG tablet   Oral   Take 1 tablet (500 mg total) by mouth 2 (two) times daily with a meal.   30 tablet   0     BP 140/90  Pulse 90  Resp 15  SpO2 97%  Physical Exam  Nursing note and vitals reviewed. Constitutional: He appears well-developed and well-nourished. No distress.  HENT:  Head: Normocephalic and atraumatic.  Mouth/Throat: Oropharynx is clear and moist. No oropharyngeal exudate.  Eyes: Conjunctivae and EOM are normal. Pupils are equal, round, and reactive to light. Right eye exhibits no discharge. Left eye exhibits no discharge. No scleral icterus.  Neck: Normal range of motion. Neck supple. No JVD present. No thyromegaly present.  Cardiovascular: Normal rate, regular rhythm, normal heart sounds and intact distal pulses.  Exam reveals no gallop and no friction rub.   No murmur heard. Occasional ectopy, underlying  normal sinus rhythm on the monitor and by auscultation, no murmurs rubs or gallops, strong pulses at the radial arteries bilaterally  Pulmonary/Chest: Effort normal and breath sounds normal. No respiratory distress. He has no  wheezes. He has no rales.  Abdominal: Soft. Bowel sounds are normal. He exhibits no distension and no mass. There is no tenderness.  Musculoskeletal: Normal range of motion. He exhibits no edema and no tenderness.  Lymphadenopathy:    He has no cervical adenopathy.  Neurological: He is alert. Coordination normal.  Skin: Skin is warm and dry. No rash noted. No erythema.  Psychiatric: He has a normal mood and affect. His behavior is normal.    ED Course  Procedures (including critical care time)  Labs Reviewed  CBC - Abnormal; Notable for the following:    WBC 1.8 (*)    MCHC 36.6 (*)    Platelets 69 (*)    All other components within normal limits  BASIC METABOLIC PANEL - Abnormal; Notable for the following:    Potassium 3.3 (*)    Glucose, Bld 109 (*)    BUN 5 (*)    All other components within normal limits  POCT I-STAT TROPONIN I   Dg Chest 2 View  10/08/2012  *RADIOLOGY REPORT*  Clinical Data: Shortness of breath.  Chest and back pain.  CHEST - 2 VIEW  Comparison: PA and lateral chest 09/03/2012 and single view of the chest 01/29/2011.  Findings: The lungs are clear.  Heart size is normal.  No pneumothorax or pleural effusion.  IMPRESSION: No acute disease.   Original Report Authenticated By: Holley Dexter, M.D.      1. Chest pain   2. Leukopenia       MDM  The patient endorses using alcohol yesterday, states that he last used cocaine a day and a half ago, initially lied to me saying that this was 2 weeks ago but changed his story when he realized a drug test to detect last 72 hours. I suspect that the patient is using drugs more frequently than he reports, he does not have any acute ischemia on his EKG today though he does have a borderline  tachycardia. He does not have any other risk factors for pulmonary embolism, his EKG does not show acute ischemia, will obtain troponin, given aspirin, reevaluate.  ED ECG REPORT  I personally interpreted this EKG   Date: 10/08/2012   Rate: 108  Rhythm: normal sinus rhythm  QRS Axis: normal  Intervals: normal  ST/T Wave abnormalities: nonspecific T wave changes  Conduction Disutrbances:none  Narrative Interpretation:   Old EKG Reviewed: Compared with 09/15/2012, rate has increased, otherwise no significant changes   The patient was reevaluated at 3:20 PM, he is not having any chest pain at this time. He does have low white blood cell count compared to prior levels, his electrolytes show a very minimal decrease in potassium at 3.3. Troponin is normal, I personally reviewed his chest x-ray but there to be no acute findings, I interpreted his x-ray 2 view PA and lateral chest showing no signs of pneumonia, infiltrate, mediastinal or mouth is or pneumothorax. Patient informed of his results including his low white blood cell count, will be referred back to his family Dr. for further workup.      Vida Roller, MD 10/08/12 [phone]

## 2012-10-08 NOTE — ED Notes (Signed)
Pt. wqs clearing snow with his hands and developed chest tightness center of the chest radiates into his back pt. Reports being sob with dizziness.  Resp. E/u skin is p/w/d

## 2012-11-19 ENCOUNTER — Encounter: Payer: Self-pay | Admitting: Nurse Practitioner

## 2012-11-29 ENCOUNTER — Emergency Department (HOSPITAL_COMMUNITY): Payer: Medicare Other

## 2012-11-29 ENCOUNTER — Encounter (HOSPITAL_COMMUNITY): Payer: Self-pay | Admitting: Emergency Medicine

## 2012-11-29 ENCOUNTER — Observation Stay (HOSPITAL_COMMUNITY)
Admission: EM | Admit: 2012-11-29 | Discharge: 2012-11-30 | Disposition: A | Discharge status: Home / Self Care | Payer: Medicare Other | Attending: Family Medicine | Admitting: Family Medicine

## 2012-11-29 DIAGNOSIS — K2270 Barrett's esophagus without dysplasia: Secondary | ICD-10-CM

## 2012-11-29 DIAGNOSIS — F1010 Alcohol abuse, uncomplicated: Secondary | ICD-10-CM | POA: Insufficient documentation

## 2012-11-29 DIAGNOSIS — F17200 Nicotine dependence, unspecified, uncomplicated: Secondary | ICD-10-CM

## 2012-11-29 DIAGNOSIS — F1910 Other psychoactive substance abuse, uncomplicated: Secondary | ICD-10-CM

## 2012-11-29 DIAGNOSIS — I252 Old myocardial infarction: Secondary | ICD-10-CM | POA: Insufficient documentation

## 2012-11-29 DIAGNOSIS — D759 Disease of blood and blood-forming organs, unspecified: Secondary | ICD-10-CM

## 2012-11-29 DIAGNOSIS — I10 Essential (primary) hypertension: Secondary | ICD-10-CM

## 2012-11-29 DIAGNOSIS — R079 Chest pain, unspecified: Secondary | ICD-10-CM | POA: Diagnosis present

## 2012-11-29 DIAGNOSIS — Z860100 Personal history of colon polyps, unspecified: Secondary | ICD-10-CM

## 2012-11-29 DIAGNOSIS — R0789 Other chest pain: Principal | ICD-10-CM | POA: Insufficient documentation

## 2012-11-29 DIAGNOSIS — I2510 Atherosclerotic heart disease of native coronary artery without angina pectoris: Secondary | ICD-10-CM

## 2012-11-29 DIAGNOSIS — F1410 Cocaine abuse, uncomplicated: Secondary | ICD-10-CM | POA: Diagnosis present

## 2012-11-29 DIAGNOSIS — E785 Hyperlipidemia, unspecified: Secondary | ICD-10-CM | POA: Diagnosis present

## 2012-11-29 DIAGNOSIS — D128 Benign neoplasm of rectum: Secondary | ICD-10-CM

## 2012-11-29 DIAGNOSIS — Z720 Tobacco use: Secondary | ICD-10-CM | POA: Diagnosis present

## 2012-11-29 DIAGNOSIS — K7460 Unspecified cirrhosis of liver: Secondary | ICD-10-CM | POA: Diagnosis present

## 2012-11-29 DIAGNOSIS — R000 Tachycardia, unspecified: Secondary | ICD-10-CM | POA: Insufficient documentation

## 2012-11-29 DIAGNOSIS — F1020 Alcohol dependence, uncomplicated: Secondary | ICD-10-CM | POA: Diagnosis present

## 2012-11-29 DIAGNOSIS — B182 Chronic viral hepatitis C: Secondary | ICD-10-CM

## 2012-11-29 DIAGNOSIS — D696 Thrombocytopenia, unspecified: Secondary | ICD-10-CM

## 2012-11-29 LAB — RAPID URINE DRUG SCREEN, HOSP PERFORMED
Barbiturates: NOT DETECTED
Cocaine: POSITIVE — AB
Tetrahydrocannabinol: NOT DETECTED

## 2012-11-29 LAB — URINE MICROSCOPIC-ADD ON

## 2012-11-29 LAB — CBC
MCH: 31.9 pg (ref 26.0–34.0)
MCHC: 36.6 g/dL — ABNORMAL HIGH (ref 30.0–36.0)
Platelets: 68 10*3/uL — ABNORMAL LOW (ref 150–400)
RDW: 15.1 % (ref 11.5–15.5)

## 2012-11-29 LAB — URINALYSIS, ROUTINE W REFLEX MICROSCOPIC
Hgb urine dipstick: NEGATIVE
Ketones, ur: 40 mg/dL — AB
Specific Gravity, Urine: 1.025 (ref 1.005–1.030)
Urobilinogen, UA: 2 mg/dL — ABNORMAL HIGH (ref 0.0–1.0)

## 2012-11-29 LAB — POCT I-STAT TROPONIN I
Troponin i, poc: 0.02 ng/mL (ref 0.00–0.08)
Troponin i, poc: 0.03 ng/mL (ref 0.00–0.08)

## 2012-11-29 LAB — LIPID PANEL
Cholesterol: 284 mg/dL — ABNORMAL HIGH (ref 0–200)
HDL: 90 mg/dL (ref >39)
Total CHOL/HDL Ratio: 3.2 RATIO
Triglycerides: 81 mg/dL (ref <150)

## 2012-11-29 LAB — BASIC METABOLIC PANEL
Calcium: 9.6 mg/dL (ref 8.4–10.5)
GFR calc Af Amer: >90 mL/min (ref >90)
GFR calc non Af Amer: 86 mL/min — ABNORMAL LOW (ref >90)
Glucose, Bld: 110 mg/dL — ABNORMAL HIGH (ref 70–99)
Potassium: 3.2 mEq/L — ABNORMAL LOW (ref 3.5–5.1)
Sodium: 138 mEq/L (ref 135–145)

## 2012-11-29 LAB — ETHANOL: Alcohol, Ethyl (B): <11 mg/dL (ref 0–11)

## 2012-11-29 MED ORDER — HEPARIN SODIUM (PORCINE) 5000 UNIT/ML IJ SOLN
5000.0000 [IU] | Freq: Three times a day (TID) | INTRAMUSCULAR | Status: DC
  Filled 2012-11-29 (×5): qty 1

## 2012-11-29 MED ORDER — LACTATED RINGERS IV SOLN
INTRAVENOUS | Status: DC
  Administered 2012-11-29: 21:00:00 via INTRAVENOUS

## 2012-11-29 MED ORDER — ONDANSETRON HCL 4 MG PO TABS: 4.0000 mg | ORAL_TABLET | Freq: Four times a day (QID) | ORAL | Status: DC | PRN

## 2012-11-29 MED ORDER — THIAMINE HCL 100 MG/ML IJ SOLN
100.0000 mg | Freq: Every day | INTRAMUSCULAR | Status: DC
  Filled 2012-11-29: qty 1

## 2012-11-29 MED ORDER — ASPIRIN EC 81 MG PO TBEC
81.0000 mg | DELAYED_RELEASE_TABLET | Freq: Every day | ORAL | Status: DC
  Administered 2012-11-30: 81 mg via ORAL
  Filled 2012-11-29 (×2): qty 1

## 2012-11-29 MED ORDER — FOLIC ACID 1 MG PO TABS
1.0000 mg | ORAL_TABLET | Freq: Every day | ORAL | Status: DC
  Administered 2012-11-30: 1 mg via ORAL
  Filled 2012-11-29: qty 1

## 2012-11-29 MED ORDER — LISINOPRIL 20 MG PO TABS
20.0000 mg | ORAL_TABLET | Freq: Every day | ORAL | Status: DC
  Administered 2012-11-29 – 2012-11-30 (×2): 20 mg via ORAL
  Filled 2012-11-29 (×2): qty 1

## 2012-11-29 MED ORDER — GI COCKTAIL ~~LOC~~: 30.0000 mL | Freq: Once | ORAL | Status: AC | PRN

## 2012-11-29 MED ORDER — PANTOPRAZOLE SODIUM 40 MG PO TBEC
80.0000 mg | DELAYED_RELEASE_TABLET | Freq: Every day | ORAL | Status: DC
  Administered 2012-11-30: 80 mg via ORAL
  Filled 2012-11-29: qty 2

## 2012-11-29 MED ORDER — NITROGLYCERIN 0.4 MG SL SUBL: 0.4000 mg | SUBLINGUAL_TABLET | SUBLINGUAL | Status: DC | PRN

## 2012-11-29 MED ORDER — ONDANSETRON HCL 4 MG/2ML IJ SOLN: 4.0000 mg | Freq: Four times a day (QID) | INTRAMUSCULAR | Status: DC | PRN

## 2012-11-29 MED ORDER — VITAMIN B-1 100 MG PO TABS
100.0000 mg | ORAL_TABLET | Freq: Every day | ORAL | Status: DC
  Administered 2012-11-30: 100 mg via ORAL
  Filled 2012-11-29: qty 1

## 2012-11-29 MED ORDER — AMLODIPINE BESYLATE 10 MG PO TABS
10.0000 mg | ORAL_TABLET | Freq: Every day | ORAL | Status: DC
  Administered 2012-11-29 – 2012-11-30 (×2): 10 mg via ORAL
  Filled 2012-11-29 (×2): qty 1

## 2012-11-29 MED ORDER — ASPIRIN 81 MG PO CHEW
324.0000 mg | CHEWABLE_TABLET | Freq: Once | ORAL | Status: AC
  Administered 2012-11-29: 324 mg via ORAL
  Filled 2012-11-29: qty 4

## 2012-11-29 MED ORDER — SODIUM CHLORIDE 0.9 % IJ SOLN
3.0000 mL | Freq: Two times a day (BID) | INTRAMUSCULAR | Status: DC
  Administered 2012-11-29: 3 mL via INTRAVENOUS

## 2012-11-29 MED ORDER — NITROGLYCERIN 0.4 MG SL SUBL
0.4000 mg | SUBLINGUAL_TABLET | SUBLINGUAL | Status: DC | PRN
  Administered 2012-11-29: 0.4 mg via SUBLINGUAL
  Filled 2012-11-29: qty 75

## 2012-11-29 MED ORDER — FOLIC ACID 1 MG PO TABS
1.0000 mg | ORAL_TABLET | Freq: Every day | ORAL | Status: DC
  Administered 2012-11-29: 1 mg via ORAL
  Filled 2012-11-29: qty 1

## 2012-11-29 MED ORDER — LORAZEPAM 2 MG/ML IJ SOLN: 1.0000 mg | Freq: Four times a day (QID) | INTRAMUSCULAR | Status: DC | PRN

## 2012-11-29 MED ORDER — SIMVASTATIN 40 MG PO TABS
40.0000 mg | ORAL_TABLET | Freq: Every evening | ORAL | Status: DC
  Administered 2012-11-29: 40 mg via ORAL
  Filled 2012-11-29 (×2): qty 1

## 2012-11-29 MED ORDER — HYDROCODONE-ACETAMINOPHEN 5-325 MG PO TABS
1.0000 | ORAL_TABLET | Freq: Three times a day (TID) | ORAL | Status: DC | PRN
  Administered 2012-11-29: 1 via ORAL
  Filled 2012-11-29: qty 1

## 2012-11-29 MED ORDER — SODIUM CHLORIDE 0.9 % IV SOLN
INTRAVENOUS | Status: DC
  Administered 2012-11-29 – 2012-11-30 (×2): via INTRAVENOUS

## 2012-11-29 MED ORDER — LORAZEPAM 1 MG PO TABS: 1.0000 mg | ORAL_TABLET | Freq: Four times a day (QID) | ORAL | Status: DC | PRN

## 2012-11-29 MED ORDER — ADULT MULTIVITAMIN W/MINERALS CH
1.0000 | ORAL_TABLET | Freq: Every day | ORAL | Status: DC
  Administered 2012-11-30: 1 via ORAL
  Filled 2012-11-29: qty 1

## 2012-11-29 MED ORDER — ALUM & MAG HYDROXIDE-SIMETH 200-200-20 MG/5ML PO SUSP: 30.0000 mL | Freq: Four times a day (QID) | ORAL | Status: DC | PRN

## 2012-11-29 MED ORDER — ASPIRIN 81 MG PO TABS: 81.0000 mg | ORAL_TABLET | Freq: Every day | ORAL | Status: DC

## 2012-11-29 NOTE — H&P (Signed)
Family Medicine Teaching [facility] Admission History and Physical Service Pager: [phone]  Patient name: Patrick [name] Medical record number: [id number] Date of birth: 01-23-1951 Age: 62 y.o. Gender: male  Primary Care Provider: Burtis Junes, MD  Chief Complaint: chest pain  Assessment and Plan: Patrick [name] is a 62 y.o. male with h/p etoh and cocaine abuse presenting with chest pain. 1. Chest pain: Ddx includes cardiac vs GI vs cocaine-induced. H/o admission January 2014 for chest pain. 1. Admit to [facility] Service telemetry, attending Dr. Lum Babe 2. Cycle troponins 3. Risk stratify with TSH, FLP, A1c 4. EKG in AM 5. F/u AM CBC and BMET 6. Maalox and GI cocktail prn indigestion/heartburn 7. Continue home aspirin 81 8. Nitrostat prn pain 9. Zofran prn nausea 10. Protonix 80mg  daily 2. EtOH abuse: last use was last night 1. CIWA protocol, does not appear acutely intoxicated but does have significant use history 3. H/o cocaine abuse with cocaine positive UDS; pt reports last use 3 days ago 1. Consult to SW for current substance abuse 4. HTN - Continue home norvasc 10, lisinopril 20 1. Consider restart Coreg in AM (BB use in patient with cocaine use) 5. HLD - Continue home statin 6. FEN/GI: Heart healthy diet, NS at 150cc/hr x 12 hrs 7. Prophylaxis: SQ heparin 8. Disposition: Admit to telemetry, discharge pending chest pain rule-out 9. Code Status: Full - need to clarify in AM  History of Present Illness: Patrick [name] is a 62 y.o. year old male presenting with chest pain. Pt reports that at 9 AM today he started having a rapid heartbeat that pounded up into his neck and came to the ED because he was worried. At that time, he started having chest pain and felt like he could see his heart beating in his neck. Once he got here at 2pm, he was still having chest pain substernally with no radiation. Pain did not radiate into neck or arm. He felt nauseated but  no emesis, diaphoresis, or sweating. Chest alternated between tight and sharp stabbing pain. This feeling lasted about 2 hours.  In the ED, he received aspirin and nitro and this relieved his pain.  Pt reports heart attack with balloon angioplasty in 2011. Since he has had a few episodes of chest pain. In January he had a negative workup. He was found cocaine positive at that time and was told to stop using cocaine. UDS today is positive for cocaine.  Of note, he has not taken any of his home medicines for the past 3 days because he has been unexpectedly staying at a friends house the past few nights.  He does admit to heavy EtOH use last night, including beer, wine, and liquor.  He smokes tobacco when he drinks, usually 1/2 pack.  He denies any illicit substance use other than cocaine 3 days ago.   Denies vomiting, diarrhea, syncope. Reports chills but no fevers.    Patient Active Problem List   Diagnosis Date Noted  . Polysubstance abuse 09/03/2012  . Leukopenia and Thrombocytopenia 01/02/2012  . 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 Alcoholism 01/02/2012  . Benign neoplasm of rectum and anal canal 01/02/2012  . Barrett's esophagus 11/17/2011  . Personal history of colonic polyps 11/11/2011  . Thrombocytopenia 03/03/2011  . Chest pain 12/03/2010  . CAD (coronary artery disease) 12/03/2010  . Hypertension 12/03/2010  . Hyperlipidemia 12/03/2010  . Tobacco abuse 12/03/2010  . Cocaine abuse 12/03/2010   Past Medical History: Past Medical History  Diagnosis Date  . MI (myocardial infarction)     x3, jan, aug, nov 2011; treated at [facility] in [place]  . Sexual dysfunction   . Arthritis   . HTN (hypertension)   . Hyperlipidemia   . Chronic chest pain   . CAD (coronary artery disease)   . Thrombocytopenia   . Cataract   . SSBE (short-segment Barrett's esophagus) 11/11/11  . Colon adenomas 11/11/11    x 5, one rectal tv adenoma  . GERD (gastroesophageal reflux disease)   .  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 Barrett's esophagus 11/17/2011    Short-segment dx 11/2011   . Alcoholism 01/02/2012    Past Surgical History: Past Surgical History  Procedure Laterality Date  . Hernia repair      very young  . Balloon angioplasty, artery  03/2010  . Colonoscopy  11/11/11    Dr. Stan Head  . Esophagogastroduodenoscopy  11/11/11    Dr. Stan Head  . Cardiac catheterization  01/31/2011  . Coronary angioplasty    . Cataract extraction w/phaco  08/18/2012    Procedure: CATARACT EXTRACTION PHACO AND INTRAOCULAR LENS PLACEMENT (IOC);  Surgeon: [name], MD;  Location: [facility] OR;  Service: Ophthalmology;  Laterality: Right;  . Cataract extraction w/phaco Left 09/22/2012    Procedure: CATARACT EXTRACTION PHACO AND INTRAOCULAR LENS PLACEMENT (IOC);  Surgeon: [name], MD;  Location: [facility] OR;  Service: Ophthalmology;  Laterality: Left;   Social History: History  Substance Use Topics  . Smoking status: Current Every Day Smoker -- 0.25 packs/day for 45 years    Types: Cigarettes  . Smokeless tobacco: Never Used  . Alcohol Use: Yes  With alcohol use, unsure of amount, ~1/2 ppd Drinks 1-2 weeks iwthout, but then will "you know" Last drank last night- sharing with people, "quite a bit of beer, wine and a little bit of liquor" Smoked cigarettes while.  Cocaine 3 d ago. None yesterday or today.    For any additional social history documentation, please refer to relevant sections of EMR.  Family History: Family History  Problem Relation Age of Onset  . Cancer      mother's side/fathers side, 2 uncles  . Heart attack Maternal Grandfather   . Diabetes Sister   . Stroke Sister   . Colon cancer Neg Hx   . Esophageal cancer Neg Hx   . Rectal cancer Neg Hx   . Stomach cancer Neg Hx    Allergies: No Known Allergies No current facility-administered medications on file prior to encounter.   Current Outpatient Prescriptions on File Prior to Encounter   Medication Sig Dispense Refill  . amLODipine (NORVASC) 10 MG tablet Take 10 mg by mouth daily.       Marland Kitchen aspirin 81 MG tablet Take 81 mg by mouth daily.       . carvedilol (COREG) 12.5 MG tablet Take 12.5 mg by mouth 2 (two) times daily with a meal.      . folic acid (FOLVITE) 1 MG tablet Take 1 mg by mouth daily.       Marland Kitchen HYDROcodone-acetaminophen (NORCO/VICODIN) 5-325 MG per tablet Take 1 tablet by mouth every 8 (eight) hours as needed for pain.      Marland Kitchen lisinopril (PRINIVIL,ZESTRIL) 20 MG tablet Take 20 mg by mouth daily.       . nitroGLYCERIN (NITROSTAT) 0.4 MG SL tablet Place 0.4 mg under the tongue every 5 (five) minutes as needed for chest pain. Up to 3 doses. If pain persist  then call 911      . omeprazole (PRILOSEC) 20 MG capsule Take 1 capsule (20 mg total) by mouth daily.  30 capsule  11  . prednisoLONE acetate (PRED FORTE) 1 % ophthalmic suspension Place 1 drop into the right eye 4 (four) times daily.      . simvastatin (ZOCOR) 40 MG tablet Take 40 mg by mouth every evening.      Reports cmpliance with meds.  Last couple days have been out (staying with friend) so has not taken meds.   Review Of Systems: Per HPI with the following additions:  Otherwise 12 point review of systems was performed and was unremarkable.  Physical Exam: BP 135/94  Pulse 94  Temp(Src) 98.5 F (36.9 C)  Resp 16  SpO2 96% Exam: General: NAD, appears comfortable, laying in bed HEENT: MMM, EOMI Cardiovascular: Some irreg beats, but overall RRR without murmur; no chest wall TTP Respiratory: CTAB, no crackles, no wheeze Abdomen: S/nd/mildly tender- most significantly mid-epigastric area Extremities: warm, no edema Neuro: grossly intact   Labs and Imaging:  CBC    Component Value Date/Time   WBC 2.9* 11/29/2012 1236   WBC 3.5* 06/02/2011 0904   RBC 4.86 11/29/2012 1236   RBC 4.20 06/02/2011 0904   HGB 15.5 11/29/2012 1236   HGB 13.3 06/02/2011 0904   HCT 42.4 11/29/2012 1236   HCT 38.8 06/02/2011  0904   PLT 68* 11/29/2012 1236   PLT 109* 06/02/2011 0904   MCV 87.2 11/29/2012 1236   MCV 92.4 06/02/2011 0904   MCH 31.9 11/29/2012 1236   MCH 31.7 06/02/2011 0904   MCHC 36.6* 11/29/2012 1236   MCHC 34.3 06/02/2011 0904   RDW 15.1 11/29/2012 1236   RDW 15.7* 06/02/2011 0904   LYMPHSABS 1.3 02/02/2012 1051   LYMPHSABS 1.5 06/02/2011 0904   MONOABS 0.5 02/02/2012 1051   MONOABS 0.5 06/02/2011 0904   EOSABS 0.1 02/02/2012 1051   EOSABS 0.2 06/02/2011 0904   BASOSABS 0.0 02/02/2012 1051   BASOSABS 0.0 06/02/2011 0904     Urinalysis    Component Value Date/Time   COLORURINE AMBER* 11/29/2012 1606   APPEARANCEUR CLEAR 11/29/2012 1606   LABSPEC 1.025 11/29/2012 1606   PHURINE 6.0 11/29/2012 1606   GLUCOSEU NEGATIVE 11/29/2012 1606   HGBUR NEGATIVE 11/29/2012 1606   BILIRUBINUR SMALL* 11/29/2012 1606   KETONESUR 40* 11/29/2012 1606   PROTEINUR 100* 11/29/2012 1606   UROBILINOGEN 2.0* 11/29/2012 1606   NITRITE NEGATIVE 11/29/2012 1606   LEUKOCYTESUR NEGATIVE 11/29/2012 1606     Simone Curia, MD   11/29/2012, 7:36 PM [facility] PGY-1 Service Pager [phone]   UPPER LEVEL ADDENDUM  I have seen and examined Patrick [name] with Dr. Benjamin Stain and I agree with the above assessment/plan. I have reviewed all available data and have made any necessary changes to the above H&P.  Patrick [name] 11/29/2012, 11:26 PM

## 2012-11-29 NOTE — ED Provider Notes (Signed)
History     CSN: [id number]  Arrival date & time 11/29/12  1225   First MD Initiated Contact with Patient 11/29/12 1422      Chief Complaint  Patient presents with  . Chest Pain  . Palpitations    (Consider location/radiation/quality/duration/timing/severity/associated sxs/prior treatment) HPI Comments: 62 year old man who said his heart was beating fast and he had pain in the Center of his chest. The pain didn't slow down with rest. It seemed to start this morning. The pain was stabbing pain felt right in the center of the chest. There is no radiation. He took no medication. He had previously had a chest pain workup in January of 2013, which was notable in that his urine was positive for cocaine, and a cardiologist had advised him to abstain from the use of cocaine and alcohol. He admits to drinking heavily, and abused cocaine about 4 days ago.  Patient is a 62 y.o. male presenting with chest pain. The history is provided by the patient and medical records. No language interpreter was used.  Chest Pain Pain location:  Substernal area Pain quality: sharp   Pain radiates to:  Does not radiate Pain radiates to the back: no   Pain severity:  Moderate Onset quality:  Gradual Duration:  3 hours Timing:  Intermittent Progression:  Waxing and waning Chronicity:  Recurrent Relieved by:  Nothing Worsened by:  Nothing tried Associated symptoms: palpitations   Associated symptoms: no fever   Risk factors comment:  Substance abuse.   Past Medical History  Diagnosis Date  . MI (myocardial infarction)     x3, jan, aug, nov 2011; treated at [facility] in [place]  . Sexual dysfunction   . Arthritis   . HTN (hypertension)   . Hyperlipidemia   . Chronic chest pain   . CAD (coronary artery disease)   . Thrombocytopenia   . Cataract   . SSBE (short-segment Barrett's esophagus) 11/11/11  . Colon adenomas 11/11/11    x 5, one rectal tv adenoma  . GERD (gastroesophageal reflux disease)   .  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 Barrett's esophagus 11/17/2011    Short-segment dx 11/2011   . Alcoholism 01/02/2012    Past Surgical History  Procedure Laterality Date  . Hernia repair      very young  . Balloon angioplasty, artery  03/2010  . Colonoscopy  11/11/11    Dr. Stan Head  . Esophagogastroduodenoscopy  11/11/11    Dr. Stan Head  . Cardiac catheterization  01/31/2011  . Coronary angioplasty    . Cataract extraction w/phaco  08/18/2012    Procedure: CATARACT EXTRACTION PHACO AND INTRAOCULAR LENS PLACEMENT (IOC);  Surgeon: [name], MD;  Location: [facility] OR;  Service: Ophthalmology;  Laterality: Right;  . Cataract extraction w/phaco Left 09/22/2012    Procedure: CATARACT EXTRACTION PHACO AND INTRAOCULAR LENS PLACEMENT (IOC);  Surgeon: [name], MD;  Location: [facility] OR;  Service: Ophthalmology;  Laterality: Left;    Family History  Problem Relation Age of Onset  . Cancer      mother's side/fathers side, 2 uncles  . Heart attack Maternal Grandfather   . Diabetes Sister   . Stroke Sister   . Colon cancer Neg Hx   . Esophageal cancer Neg Hx   . Rectal cancer Neg Hx   . Stomach cancer Neg Hx     History  Substance Use Topics  . Smoking status: Current Every Day Smoker -- 0.25 packs/day for 45 years  Types: Cigarettes  . Smokeless tobacco: Never Used  . Alcohol Use: Yes      Review of Systems  Constitutional: Negative for fever and chills.  HENT: Negative.   Eyes: Negative.   Respiratory: Negative.   Cardiovascular: Positive for chest pain and palpitations.  Gastrointestinal: Negative.   Genitourinary: Negative.   Musculoskeletal: Negative.   Psychiatric/Behavioral: Negative.     Allergies  Review of patient's allergies indicates no known allergies.  Home Medications   Current Outpatient Rx  Name  Route  Sig  Dispense  Refill  . amLODipine (NORVASC) 10 MG tablet   Oral   Take 10 mg by mouth daily.          Marland Kitchen aspirin 81 MG  tablet   Oral   Take 81 mg by mouth daily.          . carvedilol (COREG) 12.5 MG tablet   Oral   Take 12.5 mg by mouth 2 (two) times daily with a meal.         . folic acid (FOLVITE) 1 MG tablet   Oral   Take 1 mg by mouth daily.          Marland Kitchen HYDROcodone-acetaminophen (NORCO/VICODIN) 5-325 MG per tablet   Oral   Take 1 tablet by mouth every 8 (eight) hours as needed for pain.         Marland Kitchen lisinopril (PRINIVIL,ZESTRIL) 20 MG tablet   Oral   Take 20 mg by mouth daily.          . naproxen (NAPROSYN) 500 MG tablet   Oral   Take 500 mg by mouth daily.         . nitroGLYCERIN (NITROSTAT) 0.4 MG SL tablet   Sublingual   Place 0.4 mg under the tongue every 5 (five) minutes as needed for chest pain. Up to 3 doses. If pain persist then call 911         . EXPIRED: omeprazole (PRILOSEC) 20 MG capsule   Oral   Take 1 capsule (20 mg total) by mouth daily.   30 capsule   11   . prednisoLONE acetate (PRED FORTE) 1 % ophthalmic suspension   Right Eye   Place 1 drop into the right eye 4 (four) times daily.         . simvastatin (ZOCOR) 40 MG tablet   Oral   Take 40 mg by mouth every evening.           BP 135/94  Pulse 94  Temp(Src) 98.5 F (36.9 C)  Resp 16  SpO2 96%  Physical Exam  Nursing note and vitals reviewed. Constitutional: He is oriented to person, place, and time.  Heart rate 128 with occasional PVCs seen on the monitor.  HENT:  Head: Normocephalic and atraumatic.  Right Ear: External ear normal.  Left Ear: External ear normal.  Mouth/Throat: Oropharynx is clear and moist.  Eyes: Conjunctivae and EOM are normal. Pupils are equal, round, and reactive to light. No scleral icterus.  Neck: Normal range of motion. Neck supple.  Cardiovascular: Normal rate, regular rhythm and normal heart sounds.   Pulmonary/Chest: Effort normal and breath sounds normal.  Abdominal: Soft. Bowel sounds are normal.  Musculoskeletal: Normal range of motion. He exhibits no  edema and no tenderness.  Neurological: He is alert and oriented to person, place, and time.  No sensory or motor deficit.  Skin: Skin is warm and dry.  Psychiatric: He has a normal mood and affect. His  behavior is normal.    ED Course  Procedures (including critical care time)  Labs Reviewed  CBC - Abnormal; Notable for the following:    WBC 2.9 (*)    MCHC 36.6 (*)    Platelets 68 (*)    All other components within normal limits  BASIC METABOLIC PANEL - Abnormal; Notable for the following:    Potassium 3.2 (*)    Glucose, Bld 110 (*)    GFR calc non Af Amer 86 (*)    All other components within normal limits  URINALYSIS, ROUTINE W REFLEX MICROSCOPIC - Abnormal; Notable for the following:    Color, Urine AMBER (*)    Bilirubin Urine SMALL (*)    Ketones, ur 40 (*)    Protein, ur 100 (*)    Urobilinogen, UA 2.0 (*)    All other components within normal limits  URINE RAPID DRUG SCREEN (HOSP PERFORMED) - Abnormal; Notable for the following:    Cocaine POSITIVE (*)    All other components within normal limits  URINE MICROSCOPIC-ADD ON - Abnormal; Notable for the following:    Squamous Epithelial / LPF FEW (*)    Casts HYALINE CASTS (*)    All other components within normal limits  ETHANOL  POCT I-STAT TROPONIN I  POCT I-STAT TROPONIN I   Dg Chest 2 View  11/29/2012  *RADIOLOGY REPORT*  Clinical Data: Tachycardia.  CHEST - 2 VIEW  Comparison: 10/08/2012.  Findings: Trachea is midline.  Heart size normal.  Ascending aorta is mildly prominent and the descending thoracic aorta is somewhat tortuous.  Findings are unchanged.  Lungs are mildly hyperinflated but clear.  No pleural fluid.  Old right rib fractures.  IMPRESSION: No acute findings.   Original Report Authenticated By: Leanna Battles, M.D.     Date: 11/29/2012  Rate: 128  Rhythm: sinus tachycardia and premature ventricular contractions (PVC)  QRS Axis: left  Intervals: normal  ST/T Wave abnormalities: normal   Conduction Disutrbances:none  Narrative Interpretation: Borderline EKG  Old EKG Reviewed: unchanged  6:55 PM Results for orders placed during the hospital encounter of 11/29/12  CBC      Result Value Range   WBC 2.9 (*) 4.0 - 10.5 K/uL   RBC 4.86  4.22 - 5.81 MIL/uL   Hemoglobin 15.5  13.0 - 17.0 g/dL   HCT 16.1  09.6 - 04.5 %   MCV 87.2  78.0 - 100.0 fL   MCH 31.9  26.0 - 34.0 pg   MCHC 36.6 (*) 30.0 - 36.0 g/dL   RDW 40.9  81.1 - 91.4 %   Platelets 68 (*) 150 - 400 K/uL  BASIC METABOLIC PANEL      Result Value Range   Sodium 138  135 - 145 mEq/L   Potassium 3.2 (*) 3.5 - 5.1 mEq/L   Chloride 98  96 - 112 mEq/L   CO2 24  19 - 32 mEq/L   Glucose, Bld 110 (*) 70 - 99 mg/dL   BUN 6  6 - 23 mg/dL   Creatinine, Ser 7.82  0.50 - 1.35 mg/dL   Calcium 9.6  8.4 - 95.6 mg/dL   GFR calc non Af Amer 86 (*) >90 mL/min   GFR calc Af Amer >90  >90 mL/min  ETHANOL      Result Value Range   Alcohol, Ethyl (B) <11  0 - 11 mg/dL  URINALYSIS, ROUTINE W REFLEX MICROSCOPIC      Result Value Range   Color, Urine AMBER (*)  YELLOW   APPearance CLEAR  CLEAR   Specific Gravity, Urine 1.025  1.005 - 1.030   pH 6.0  5.0 - 8.0   Glucose, UA NEGATIVE  NEGATIVE mg/dL   Hgb urine dipstick NEGATIVE  NEGATIVE   Bilirubin Urine SMALL (*) NEGATIVE   Ketones, ur 40 (*) NEGATIVE mg/dL   Protein, ur 914 (*) NEGATIVE mg/dL   Urobilinogen, UA 2.0 (*) 0.0 - 1.0 mg/dL   Nitrite NEGATIVE  NEGATIVE   Leukocytes, UA NEGATIVE  NEGATIVE  URINE RAPID DRUG SCREEN (HOSP PERFORMED)      Result Value Range   Opiates NONE DETECTED  NONE DETECTED   Cocaine POSITIVE (*) NONE DETECTED   Benzodiazepines NONE DETECTED  NONE DETECTED   Amphetamines NONE DETECTED  NONE DETECTED   Tetrahydrocannabinol NONE DETECTED  NONE DETECTED   Barbiturates NONE DETECTED  NONE DETECTED  URINE MICROSCOPIC-ADD ON      Result Value Range   Squamous Epithelial / LPF FEW (*) RARE   WBC, UA 0-2  <3 WBC/hpf   RBC / HPF 0-2  <3 RBC/hpf    Bacteria, UA RARE  RARE   Casts HYALINE CASTS (*) NEGATIVE   Urine-Other MUCOUS PRESENT    POCT I-STAT TROPONIN I      Result Value Range   Troponin i, poc 0.02  0.00 - 0.08 ng/mL   Comment 3           POCT I-STAT TROPONIN I      Result Value Range   Troponin i, poc 0.03  0.00 - 0.08 ng/mL   Comment 3            Dg Chest 2 View  11/29/2012  *RADIOLOGY REPORT*  Clinical Data: Tachycardia.  CHEST - 2 VIEW  Comparison: 10/08/2012.  Findings: Trachea is midline.  Heart size normal.  Ascending aorta is mildly prominent and the descending thoracic aorta is somewhat tortuous.  Findings are unchanged.  Lungs are mildly hyperinflated but clear.  No pleural fluid.  Old right rib fractures.  IMPRESSION: No acute findings.   Original Report Authenticated By: Leanna Battles, M.D.     Course in ED:  Pt had physical examination and laboratory tests, EKG and chest x-ray to check on him for chest pain.  His lab tests were negative.  His UDS was positive for cocaine.  He had some relief of chest pain with aspirin and nitroglycerin, but continues to feel pain.  Will call unassigned medicine to admit him for chest pain observation.   IMP:  Chest pain          Cocaine abuse    Carleene Cooper III, MD 11/29/12 1958

## 2012-11-29 NOTE — ED Notes (Signed)
Pt sts mid sternal CP and palpitations starting today; pt sts last cocaine use 2 days ago

## 2012-11-30 DIAGNOSIS — B182 Chronic viral hepatitis C: Secondary | ICD-10-CM

## 2012-11-30 DIAGNOSIS — K7460 Unspecified cirrhosis of liver: Secondary | ICD-10-CM

## 2012-11-30 LAB — CBC
HCT: 38.8 % — ABNORMAL LOW (ref 39.0–52.0)
Hemoglobin: 14.3 g/dL (ref 13.0–17.0)
MCH: 32.4 pg (ref 26.0–34.0)
MCHC: 36.9 g/dL — ABNORMAL HIGH (ref 30.0–36.0)
MCV: 88 fL (ref 78.0–100.0)

## 2012-11-30 LAB — BASIC METABOLIC PANEL
BUN: 9 mg/dL (ref 6–23)
CO2: 29 mEq/L (ref 19–32)
Chloride: 100 mEq/L (ref 96–112)
Creatinine, Ser: 0.91 mg/dL (ref 0.50–1.35)
Glucose, Bld: 95 mg/dL (ref 70–99)
Potassium: 3.4 mEq/L — ABNORMAL LOW (ref 3.5–5.1)

## 2012-11-30 LAB — TROPONIN I: Troponin I: <0.3 ng/mL (ref <0.30)

## 2012-11-30 LAB — HEMOGLOBIN A1C: Mean Plasma Glucose: 120 mg/dL — ABNORMAL HIGH (ref <117)

## 2012-11-30 MED ORDER — ROSUVASTATIN CALCIUM 10 MG PO TABS
10.0000 mg | ORAL_TABLET | Freq: Every day | ORAL | Status: DC
  Filled 2012-11-30: qty 1

## 2012-11-30 MED ORDER — ATORVASTATIN CALCIUM 40 MG PO TABS: 40.0000 mg | ORAL_TABLET | Freq: Every day | ORAL | Status: DC

## 2012-11-30 MED ORDER — ROSUVASTATIN CALCIUM 20 MG PO TABS
20.0000 mg | ORAL_TABLET | Freq: Every day | ORAL | Status: DC
  Filled 2012-11-30: qty 1

## 2012-11-30 MED ORDER — CARVEDILOL 12.5 MG PO TABS
12.5000 mg | ORAL_TABLET | Freq: Two times a day (BID) | ORAL | Status: DC
  Administered 2012-11-30: 12.5 mg via ORAL
  Filled 2012-11-30 (×3): qty 1

## 2012-11-30 MED ORDER — ATORVASTATIN CALCIUM 40 MG PO TABS
40.0000 mg | ORAL_TABLET | Freq: Every day | ORAL | Status: DC
  Filled 2012-11-30: qty 1

## 2012-11-30 NOTE — H&P (Signed)
FMTS Attending Admission Note: Patrick Struss,MD I  have seen and examined this patient, reviewed their chart. I have discussed this patient with the resident. I agree with the resident's findings, assessment and care plan.  Patient seen and assessed by me this morning,he feels much better,denies chest pain,no SOB. He mentioned he had been drinking for the last few days and used cocaine recently. Counseling was done on ETOH and cocaine use and effect on his health in general. Patient understands and agreed to quit cocaine as well as alcohol. Continue cardiac monitoring,might benefit from stress test either as outpatient or during this admission. I will leave this decision with the FMTS attending on for this week.

## 2012-11-30 NOTE — Progress Notes (Signed)
Family Medicine Teaching Service Daily Progress Note Service Page: [phone]  Subjective: No more chest pain overnight, last episode was last night before taking vicoden which has now resolved. Some nausea and dyspnea with chest pain yesterday.   Objective: Temp:  [98 F (36.7 C)-98.6 F (37 C)] 98 F (36.7 C) (04/29 0500) Pulse Rate:  [63-100] 77 (04/29 0500) Resp:  [11-24] 20 (04/29 0500) BP: (130-159)/(81-111) 139/91 mmHg (04/29 0500) SpO2:  [95 %-99 %] 99 % (04/29 0500) Weight:  [171 lb 6.4 oz (77.747 kg)] 171 lb 6.4 oz (77.747 kg) (04/28 2105) Exam: Gen: NAD, alert, cooperative with exam HEENT: NCAT, EOMI, PERRL CV: RRR, good S1/S2, no murmur Resp: some soft crackles in R base, non labored Abd: Soft, mild tenderness in episgastrium Ext: No edema, warm Neuro: Alert and oriented, No gross deficits   I have reviewed the patient's medications, labs, imaging, and diagnostic testing.  Notable results are summarized below.  CBC BMET   Recent Labs Lab 11/29/12 1236 11/30/12 0256  WBC 2.9* 3.2*  HGB 15.5 14.3  HCT 42.4 38.8*  PLT 68* 40*    Recent Labs Lab 11/29/12 1236 11/30/12 0256  NA 138 137  K 3.2* 3.4*  CL 98 100  CO2 24 29  BUN 6 9  CREATININE 0.99 0.91  GLUCOSE 110* 95  CALCIUM 9.6 9.0       Recent Labs Lab 11/29/12 2114 11/30/12 0256  TROPONINI <0.30 <0.30    Imaging/Diagnostic Tests: DG chest 11/29/2012 IMPRESSION:  No acute findings.    Plan: Patrick [name] is a 62 y.o. male with h/p etoh and cocaine abuse presenting with chest pain.  Chest pain - DDx includes cardiac vs GI vs cocaine-induced. H/o admission January 2014 for chest pain. - Troponins negative X2 - EKG with sinus tach and frequent PVCs, repeat this am - TSH 1.3,  A1C 5.8 - Lipids: Tcholesterol 284, Trg 81, HDL 90, LDL 178 - Continue ASA, nitro, zofran, protonix  EtOH abuse: last use 36 hours ago - CIWA protocol- no PRN ativan needed  H/o Cocaine abuse - positive on  UDS, reports use 3 days ago - SW consult for substance abuse  HTN  - 139/91 this am, 130-159/91-93 - Continue home norvasc and lisinopril,  - restart coreg although cautioun with cocaine use   HLD - Lipids: Tcholesterol 284, Trg 81, HDL 90, LDL 178 - ASCVD 10 year risk is 25.4%,  - Consider increasing to high intensity statin- atorvastatin 40   FEN/GI: Heart healthy diet, NS at 150/hr for 12 hours PPx: sq heparin Dispo: Tele, dc when ruled out, possibly today Code Status: Full will clarify  Kevin Fenton, MD 11/30/2012, 6:40 AM

## 2012-11-30 NOTE — Progress Notes (Signed)
INITIAL NUTRITION ASSESSMENT  DOCUMENTATION CODES Per approved criteria  -Not Applicable   INTERVENTION:  No nutrition intervention at this time ---> patient declined RD to follow for nutrition care plan  NUTRITION DIAGNOSIS: Inadequate oral intake related to decreased appetite, nausea as evidenced by PO intake 30%  Goal: Oral intake with meals to meet >/= 90% of estimated nutrition needs  Monitor:  PO intake, weight, labs, I/O's  Reason for Assessment: Malnutrition Screening Tool Report  62 y.o. male  Admitting Dx: Chest pain  ASSESSMENT: Patient with hx of ETOH and cocaine abuse presented with chest pain; reports his appetite is decreased, is experiencing some nausea; he states he has lost "a couple pounds;" per weight records, he's lost 9 lbs since February 2014 (5%) which is not significant for time frame; PO intake 30% per flowsheet records; declining addition of nutrition supplements at this time.  Height: Ht Readings from Last 1 Encounters:  11/29/12 5\' 9"  (1.753 m)    Weight: Wt Readings from Last 1 Encounters:  11/29/12 171 lb 6.4 oz (77.747 kg)    Ideal Body Weight: 160 lb  % Ideal Body Weight: 106%  Wt Readings from Last 10 Encounters:  11/29/12 171 lb 6.4 oz (77.747 kg)  09/15/12 180 lb (81.647 kg)  09/05/12 179 lb 8 oz (81.421 kg)  08/16/12 184 lb (83.462 kg)  01/12/12 184 lb (83.462 kg)  01/02/12 185 lb (83.915 kg)  12/24/11 185 lb (83.915 kg)  11/11/11 186 lb (84.369 kg)  10/28/11 186 lb (84.369 kg)  03/03/11 181 lb (82.101 kg)    Usual Body Weight: 180 lb  % Usual Body Weight: 95%  BMI:  Body mass index is 25.3 kg/(m^2).  Estimated Nutritional Needs: Kcal: 1900-2100 Protein: 95-105 gm Fluid: 1.9-2.1 L  Skin: Intact  Diet Order: Cardiac  EDUCATION NEEDS: -No education needs identified at this time   Intake/Output Summary (Last 24 hours) at 11/30/12 1358 Last data filed at 11/30/12 0800  Gross per 24 hour  Intake   2040 ml   Output      0 ml  Net   2040 ml    Labs:   Recent Labs Lab 11/29/12 1236 11/30/12 0256  NA 138 137  K 3.2* 3.4*  CL 98 100  CO2 24 29  BUN 6 9  CREATININE 0.99 0.91  CALCIUM 9.6 9.0  GLUCOSE 110* 95    Scheduled Meds: . amLODipine  10 mg Oral Daily  . aspirin EC  81 mg Oral Daily  . carvedilol  12.5 mg Oral BID WC  . folic acid  1 mg Oral Daily  . lisinopril  20 mg Oral Daily  . multivitamin with minerals  1 tablet Oral Daily  . pantoprazole  80 mg Oral Daily  . rosuvastatin  10 mg Oral q1800  . sodium chloride  3 mL Intravenous Q12H  . thiamine  100 mg Oral Daily   Or  . thiamine  100 mg Intravenous Daily    Continuous Infusions: . sodium chloride 150 mL/hr at 11/30/12 0409    Past Medical History  Diagnosis Date  . MI (myocardial infarction)     x3, jan, aug, nov 2011; treated at [facility] in [place]  . Sexual dysfunction   . Arthritis   . HTN (hypertension)   . Hyperlipidemia   . Chronic chest pain   . CAD (coronary artery disease)   . Thrombocytopenia   . Cataract   . SSBE (short-segment Barrett's esophagus) 11/11/11  . Colon adenomas  11/11/11    x 5, one rectal tv adenoma  . GERD (gastroesophageal reflux disease)   . 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 Barrett's esophagus 11/17/2011    Short-segment dx 11/2011   . Alcoholism 01/02/2012    Past Surgical History  Procedure Laterality Date  . Hernia repair      very young  . Balloon angioplasty, artery  03/2010  . Colonoscopy  11/11/11    Dr. Stan Head  . Esophagogastroduodenoscopy  11/11/11    Dr. Stan Head  . Cardiac catheterization  01/31/2011  . Coronary angioplasty    . Cataract extraction w/phaco  08/18/2012    Procedure: CATARACT EXTRACTION PHACO AND INTRAOCULAR LENS PLACEMENT (IOC);  Surgeon: [name], MD;  Location: [facility] OR;  Service: Ophthalmology;  Laterality: Right;  . Cataract extraction w/phaco Left 09/22/2012    Procedure: CATARACT EXTRACTION PHACO AND INTRAOCULAR LENS  PLACEMENT (IOC);  Surgeon: [name], MD;  Location: [facility] OR;  Service: Ophthalmology;  Laterality: Left;    Maureen Chatters, RD, LDN Pager #: [phone] After-Hours Pager #: [phone]

## 2012-11-30 NOTE — Discharge Summary (Signed)
Family Medicine Teaching [facility] Discharge Summary  Patient name: Patrick [name] Medical record number: [id number] Date of birth: 1951-08-04 Age: 62 y.o. Gender: male Date of Admission: 11/29/2012  Date of Discharge: 11/29/2012 Admitting Physician: Janit Pagan, MD  Primary Care Provider: Burtis Junes, MD  Indication for Hospitalization: Chest pain Discharge Diagnoses:  1. Chest pain, non cardiac in etiology 2. Ethanol Abuse 3. Cocaine Abuse 4. HTN 5. HLD  Consultations: None  Significant Labs and Imaging:    Recent Labs Lab 11/29/12 2114 11/30/12 0256  TROPONINI <0.30 <0.30   POC Troponin 11/29/2012, 0.02, 0.03   Recent Labs Lab 11/29/12 1236 11/30/12 0256  NA 138 137  K 3.2* 3.4*  CL 98 100  CO2 24 29  GLUCOSE 110* 95  BUN 6 9  CREATININE 0.99 0.91  CALCIUM 9.6 9.0    Recent Labs Lab 11/29/12 1236 11/30/12 0256  HGB 15.5 14.3  HCT 42.4 38.8*  WBC 2.9* 3.2*  PLT 68* 40*   UDS positive for Cocaine  DG chest 11/29/2012  IMPRESSION:  No acute findings.   Procedures: None  Brief Hospital Course:  Patrick [name] is a 62 y.o. male with history of EtOH and cocaine abuse presenting with chest pain. Of note he has a PMH of CAD with balloon angioplasty in 2011.   Chest pain  He presented with chest pain that had typical and atypical features. He was admitted for ASC rule out which was accomplished with troponins negative X4 and a stable EKG. Risk stratification labs were collected, as detailed below. After his work up it appears that the most likely etiology of this pain is likely not cardiac but is rather GI or anxiety related. We started atorvastatin at 40 mg daily as he has previous coronary event and advised hiom to follow up with his cardiologist for further work up.   - TSH 1.3, A1C 5.8  - Lipids: Tcholesterol 284, Trg 81, HDL 90, LDL 178  - Continue ASA, nitro, protonix   EtOH abuse, Cocaine abuse  HE was placed on CIWA protocol and no  ativan was needed to manage withdrawal symptoms. He was further counseled about his continued cocaine use, which he understood, and he was given information for community recourses to help support him quitting.   HTN  Elevated slightly to 130-159/91-93. We continued his home norvasc, lisinopril, and coreg.   HLD  His Lipids were found to be elevated and he has a previous coronary event so we started him on moderate potency statin.  - Lipids: Tcholesterol 284, Trg 81, HDL 90, LDL 178  - atorvastatin 40 mg qd  Dispo: Home  Discharge Medications:    Medication List    STOP taking these medications       prednisoLONE acetate 1 % ophthalmic suspension  Commonly known as:  PRED FORTE     simvastatin 40 MG tablet  Commonly known as:  ZOCOR      TAKE these medications       amLODipine 10 MG tablet  Commonly known as:  NORVASC  Take 10 mg by mouth daily.     aspirin 81 MG tablet  Take 81 mg by mouth daily.     atorvastatin 40 MG tablet  Commonly known as:  LIPITOR  Take 1 tablet (40 mg total) by mouth daily at 6 PM.     carvedilol 12.5 MG tablet  Commonly known as:  COREG  Take 12.5 mg by mouth 2 (two) times daily with a meal.  folic acid 1 MG tablet  Commonly known as:  FOLVITE  Take 1 mg by mouth daily.     HYDROcodone-acetaminophen 5-325 MG per tablet  Commonly known as:  NORCO/VICODIN  Take 1 tablet by mouth every 8 (eight) hours as needed for pain.     lisinopril 20 MG tablet  Commonly known as:  PRINIVIL,ZESTRIL  Take 20 mg by mouth daily.     naproxen 500 MG tablet  Commonly known as:  NAPROSYN  Take 500 mg by mouth daily.     nitroGLYCERIN 0.4 MG SL tablet  Commonly known as:  NITROSTAT  Place 0.4 mg under the tongue every 5 (five) minutes as needed for chest pain. Up to 3 doses. If pain persist then call 911     omeprazole 20 MG capsule  Commonly known as:  PRILOSEC  Take 1 capsule (20 mg total) by mouth daily.       Issues for Follow Up:  -  Continue titrating antihypertensives to gain good control of his BP - Counsel and support Cocaine and alcohol abuse  Outstanding Results: None  Discharge Instructions: Please refer to Patient Instructions section of EMR for full details.  Patient was counseled important signs and symptoms that should prompt return to medical care, changes in medications, dietary instructions, activity restrictions, and follow up appointments.   Follow-up Information   Follow up with Burtis Junes, MD. (call today or tomorrow for hospital follow up in 1 week )    Contact information:   [address] [address] [facility] [address] [phone]       Follow up with Olga Millers, MD. (call today of tomorrow for hospital follow up in 1-2 weeks)    Contact information:   [address] [place], [place] [address]                         [address] [place] [address] [phone]       Discharge Condition: Carlena Sax, MD 11/30/2012, 11:38 AM

## 2012-12-01 ENCOUNTER — Telehealth: Payer: Self-pay | Admitting: Family Medicine

## 2012-12-01 NOTE — Telephone Encounter (Signed)
I got patient's EKG report done 11/30/12 during hospital admission in my inbox, my guess is the resident and the attending in charge for that day would have addressed this but I went ahead and called patient today to check on him and to discuss EKG report below;  (Sinus rhythm with marked sinus arrhythmia,Prolonged QT,Mild diffuse ST elevation consider pericarditis or early repolarization,Rate slower Since last tracing) with patient. Patient stated he feels ok,no chest pain or SOB. He has follow up with his PMD Dr Clyda Greener on 12/14/12,his cardiologist is at [place],I advised he should call for an appointment with his cardiologist this week so he can get a repeat EKG. I also suggested if he becomes SOB or with chest pain to return to the hospital. He verbalized understanding.

## 2012-12-06 ENCOUNTER — Other Ambulatory Visit: Payer: Self-pay | Admitting: Internal Medicine

## 2013-05-25 ENCOUNTER — Other Ambulatory Visit: Payer: Self-pay

## 2013-05-25 MED ORDER — NITROGLYCERIN 0.4 MG SL SUBL: 0.4000 mg | SUBLINGUAL_TABLET | SUBLINGUAL | Status: DC | PRN

## 2013-07-04 ENCOUNTER — Other Ambulatory Visit: Payer: Self-pay | Admitting: Nurse Practitioner

## 2013-07-04 ENCOUNTER — Ambulatory Visit
Admission: RE | Admit: 2013-07-04 | Discharge: 2013-07-04 | Disposition: A | Discharge status: Home / Self Care | Payer: Medicare Other | Source: Ambulatory Visit | Attending: Nurse Practitioner | Admitting: Nurse Practitioner

## 2013-07-04 DIAGNOSIS — R52 Pain, unspecified: Secondary | ICD-10-CM

## 2013-11-14 ENCOUNTER — Other Ambulatory Visit: Payer: Self-pay | Admitting: Internal Medicine

## 2013-11-22 ENCOUNTER — Other Ambulatory Visit: Payer: Self-pay | Admitting: Internal Medicine

## 2014-05-25 ENCOUNTER — Ambulatory Visit (INDEPENDENT_AMBULATORY_CARE_PROVIDER_SITE_OTHER): Payer: Medicare HMO | Admitting: Internal Medicine

## 2014-05-25 VITALS — BP 160/100 | HR 76 | Ht 69.0 in | Wt 171.0 lb

## 2014-05-25 DIAGNOSIS — B182 Chronic viral hepatitis C: Secondary | ICD-10-CM

## 2014-05-25 DIAGNOSIS — F1020 Alcohol dependence, uncomplicated: Secondary | ICD-10-CM

## 2014-05-25 DIAGNOSIS — Z86010 Personal history of colonic polyps: Secondary | ICD-10-CM

## 2014-05-25 DIAGNOSIS — K2270 Barrett's esophagus without dysplasia: Secondary | ICD-10-CM

## 2014-05-26 ENCOUNTER — Encounter: Payer: Self-pay | Admitting: Internal Medicine

## 2014-05-26 NOTE — Progress Notes (Signed)
Subjective:    Patient ID: Patrick [name], male    DOB: 30-Aug-1950, 63 y.o.   MRN: [id number]  HPI The patient is a very nice man I know from previous colonoscopy in April of 2013. He has not yet followed up for a large tubulovillous adenoma of the rectum. I had originally planned on a sigmoidoscopy several months after that he has not done so. He denies any problems at this time. He also had Barrett's esophagus diagnosed at that time. Additionally he has hepatitis C with cirrhosis. At the time of his visit our computer was down I do not have this information available. No Known Allergies Outpatient Prescriptions Prior to Visit  Medication Sig Dispense Refill  . amLODipine (NORVASC) 10 MG tablet Take 10 mg by mouth daily.       Marland Kitchen aspirin 81 MG tablet Take 81 mg by mouth daily.       Marland Kitchen atorvastatin (LIPITOR) 40 MG tablet Take 1 tablet (40 mg total) by mouth daily at 6 PM.  30 tablet  3  . carvedilol (COREG) 12.5 MG tablet Take 12.5 mg by mouth 2 (two) times daily with a meal.      . HYDROcodone-acetaminophen (NORCO/VICODIN) 5-325 MG per tablet Take 1 tablet by mouth every 8 (eight) hours as needed for pain.      Marland Kitchen lisinopril (PRINIVIL,ZESTRIL) 20 MG tablet Take 20 mg by mouth daily.       . nitroGLYCERIN (NITROSTAT) 0.4 MG SL tablet Place 1 tablet (0.4 mg total) under the tongue every 5 (five) minutes as needed for chest pain. Up to 3 doses. If pain persist then call 911  25 tablet  0  . omeprazole (PRILOSEC) 20 MG capsule TAKE ONE CAPSULE BY MOUTH EVERY DAY  30 capsule  6  . naproxen (NAPROSYN) 500 MG tablet Take 500 mg by mouth daily.      . folic acid (FOLVITE) 1 MG tablet Take 1 mg by mouth daily.        No facility-administered medications prior to visit.   Past Medical History  Diagnosis Date  . MI (myocardial infarction)     x3, jan, aug, nov 2011; treated at [facility] in [place]  . Sexual dysfunction   . Arthritis   . HTN (hypertension)   . Hyperlipidemia   . Chronic chest pain   .  CAD (coronary artery disease)   . Thrombocytopenia   . Cataract   . SSBE (short-segment Barrett's esophagus) 11/11/11  . Colon adenomas 11/11/11    x 5, one rectal tv adenoma  . GERD (gastroesophageal reflux disease)   . 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 Barrett's esophagus 11/17/2011    Short-segment dx 11/2011   . Alcoholism 01/02/2012   Past Surgical History  Procedure Laterality Date  . Hernia repair      very young  . Balloon angioplasty, artery  03/2010  . Colonoscopy  11/11/11    Dr. Silvano Rusk  . Esophagogastroduodenoscopy  11/11/11    Dr. Silvano Rusk  . Cardiac catheterization  01/31/2011  . Coronary angioplasty    . Cataract extraction w/phaco  08/18/2012    Procedure: CATARACT EXTRACTION PHACO AND INTRAOCULAR LENS PLACEMENT (IOC);  Surgeon: Adonis Brook, MD;  Location: [facility];  Service: Ophthalmology;  Laterality: Right;  . Cataract extraction w/phaco Left 09/22/2012    Procedure: CATARACT EXTRACTION PHACO AND INTRAOCULAR LENS PLACEMENT (IOC);  Surgeon: Adonis Brook, MD;  Location: [facility];  Service: Ophthalmology;  Laterality: Left;  History   Social History  . Marital Status: Single    Spouse Name: N/A    Number of Children: 3  . Years of Education: N/A   Occupational History  .      Unemployed   Social History Main Topics  . Smoking status: Current Every Day Smoker -- 0.25 packs/day for 45 years    Types: Cigarettes  . Smokeless tobacco: Never Used  . Alcohol Use: Yes  . Drug Use: No     Comment: Cocaine + 08/2012. Previous heroin, cocaine and marijuana abuse.           Review of Systems As above    Objective:   Physical Exam  NAD      Assessment & Plan:  Barrett's esophagus Close to 3 yr f/u so reasonable to schedule EGD with a colonoscopy  History of colonic polyps Overdue for flex sig f/u At this point would do full colonscopy Will contact him and arrange this and EGD  Chronic hepatitis C with cirrhosis Needs lab w/u with  CBC, CMET, PT/INR, Alpha fetoprotein  Will need HBV vaccine, needs influenza vaccine, needs Pneumovax  Will clarify alcohol/drug use  Will consider [facility] referral after alcohol/drug use review   FG:[id number] Evette Doffing, MD

## 2014-05-27 NOTE — Assessment & Plan Note (Signed)
Close to 3 yr f/u so reasonable to schedule EGD with a colonoscopy

## 2014-05-27 NOTE — Assessment & Plan Note (Signed)
Overdue for flex sig f/u At this point would do full colonscopy Will contact him and arrange this and EGD

## 2014-05-27 NOTE — Assessment & Plan Note (Addendum)
Needs lab w/u with CBC, CMET, PT/INR, Alpha fetoprotein  Will need HBV vaccine, needs influenza vaccine, needs Pneumovax  Will clarify alcohol/drug use  Will consider [facility] referral after alcohol/drug use review

## 2014-05-29 ENCOUNTER — Telehealth: Payer: Self-pay

## 2014-05-29 DIAGNOSIS — B182 Chronic viral hepatitis C: Secondary | ICD-10-CM

## 2014-05-29 DIAGNOSIS — K7460 Unspecified cirrhosis of liver: Principal | ICD-10-CM

## 2014-05-29 NOTE — Telephone Encounter (Signed)
Spoke with patient about Dr. Celesta Aver assessment and plan from his 05/25/14 office visit.  The computers went down before patient's information could be reviewed.  Patient will come by this week to get lab work drawn, orders put in.  Colonel said he will go to the [facility] near him to get the flu shot and pneumovax.  He wants to try and arrange a driver before setting up the EGD/ Colon that Dr. Carlean Purl wants him to have.  He said he had HBV series of 3 shots done at [facility].  I contacted them and had to leave a message trying to confirm this.  To clarify his alcohol use:  Drinks a "5th of wine every other day", d/c'ed liquor b/c caused him to have body aches.  He states that he d/c'ed drug use several months ago.  I will route this information to Dr. Carlean Purl.

## 2014-06-02 ENCOUNTER — Other Ambulatory Visit (INDEPENDENT_AMBULATORY_CARE_PROVIDER_SITE_OTHER): Payer: Medicare HMO

## 2014-06-02 DIAGNOSIS — K7460 Unspecified cirrhosis of liver: Principal | ICD-10-CM

## 2014-06-02 DIAGNOSIS — B182 Chronic viral hepatitis C: Secondary | ICD-10-CM

## 2014-06-02 LAB — COMPREHENSIVE METABOLIC PANEL
ALBUMIN: 3.2 g/dL — AB (ref 3.5–5.2)
ALK PHOS: 102 U/L (ref 39–117)
ALT: 99 U/L — ABNORMAL HIGH (ref 0–53)
AST: 141 U/L — ABNORMAL HIGH (ref 0–37)
BUN: 8 mg/dL (ref 6–23)
CALCIUM: 9.2 mg/dL (ref 8.4–10.5)
CHLORIDE: 106 meq/L (ref 96–112)
CO2: 27 mEq/L (ref 19–32)
CREATININE: 0.9 mg/dL (ref 0.4–1.5)
GFR: 106.67 mL/min (ref >60.00)
GLUCOSE: 94 mg/dL (ref 70–99)
POTASSIUM: 4 meq/L (ref 3.5–5.1)
Sodium: 140 mEq/L (ref 135–145)
Total Bilirubin: 0.8 mg/dL (ref 0.2–1.2)
Total Protein: 8.3 g/dL (ref 6.0–8.3)

## 2014-06-02 LAB — CBC WITH DIFFERENTIAL/PLATELET
BASOS PCT: 0.5 % (ref 0.0–3.0)
Basophils Absolute: 0 10*3/uL (ref 0.0–0.1)
EOS ABS: 0.1 10*3/uL (ref 0.0–0.7)
EOS PCT: 3.4 % (ref 0.0–5.0)
HEMATOCRIT: 38 % — AB (ref 39.0–52.0)
HEMOGLOBIN: 12.8 g/dL — AB (ref 13.0–17.0)
Lymphocytes Relative: 36.1 % (ref 12.0–46.0)
Lymphs Abs: 1 10*3/uL (ref 0.7–4.0)
MCHC: 33.7 g/dL (ref 30.0–36.0)
MCV: 101.3 fl — AB (ref 78.0–100.0)
MONO ABS: 0.4 10*3/uL (ref 0.1–1.0)
Monocytes Relative: 12.9 % — ABNORMAL HIGH (ref 3.0–12.0)
NEUTROS ABS: 1.3 10*3/uL — AB (ref 1.4–7.7)
NEUTROS PCT: 47.1 % (ref 43.0–77.0)
Platelets: 69 10*3/uL — ABNORMAL LOW (ref 150.0–400.0)
RBC: 3.75 Mil/uL — AB (ref 4.22–5.81)
RDW: 15 % (ref 11.5–15.5)
WBC: 2.8 10*3/uL — AB (ref 4.0–10.5)

## 2014-06-02 LAB — PROTIME-INR
INR: 1 ratio (ref 0.8–1.0)
PROTHROMBIN TIME: 11.6 s (ref 9.6–13.1)

## 2014-06-03 LAB — AFP TUMOR MARKER: AFP TUMOR MARKER: 6.3 ng/mL — AB (ref <6.1)

## 2014-06-03 NOTE — Progress Notes (Signed)
Quick Note:  Mild anemia, low PLT, low WBC and Abnl LFT's from HCV, alcohol and cirrhosis Needs to abstain from alcohol He is working on scheduling a rider for endoscopic evaluations ______

## 2014-06-05 NOTE — Telephone Encounter (Signed)
Left another message at the [facility] to please fax over the HBV vaccine dates.

## 2014-06-06 NOTE — Telephone Encounter (Signed)
Per [facility], patient has never had an HBV series there, only a flu shot.

## 2014-07-12 ENCOUNTER — Encounter: Payer: Self-pay | Admitting: Internal Medicine

## 2014-07-12 NOTE — Telephone Encounter (Signed)
Ask him to return to office for a visit in early 2016

## 2014-07-12 NOTE — Telephone Encounter (Signed)
I have spoken to patient. He will come on 09/04/14 at 10:45 am for visit with Dr Carlean Purl.

## 2014-07-12 NOTE — Telephone Encounter (Signed)
This encounter was created in error - please disregard.

## 2014-09-04 ENCOUNTER — Ambulatory Visit (INDEPENDENT_AMBULATORY_CARE_PROVIDER_SITE_OTHER): Payer: Medicare HMO | Admitting: Internal Medicine

## 2014-09-04 ENCOUNTER — Encounter: Payer: Self-pay | Admitting: Internal Medicine

## 2014-09-04 VITALS — BP 132/90 | HR 80 | Ht 68.5 in | Wt 169.5 lb

## 2014-09-04 DIAGNOSIS — K2270 Barrett's esophagus without dysplasia: Secondary | ICD-10-CM

## 2014-09-04 DIAGNOSIS — K7460 Unspecified cirrhosis of liver: Secondary | ICD-10-CM

## 2014-09-04 DIAGNOSIS — F1020 Alcohol dependence, uncomplicated: Secondary | ICD-10-CM

## 2014-09-04 DIAGNOSIS — Z86010 Personal history of colonic polyps: Secondary | ICD-10-CM

## 2014-09-04 DIAGNOSIS — B182 Chronic viral hepatitis C: Secondary | ICD-10-CM

## 2014-09-04 NOTE — Assessment & Plan Note (Addendum)
Will look into referral for HCV Tx - may not qualify if drinking Counseled to eliminate EtOH Says he has had HBV vaccinations 1-2 years ago Had influenza vaccine this year Waiting to get pneumovax Abd [place] - screen for [facility]

## 2014-09-04 NOTE — Assessment & Plan Note (Signed)
Schedule for EGD to follow-up Barrett's esophagus and also screen for esophageal varices. The risks and benefits as well as alternatives of endoscopic procedure(s) have been discussed and reviewed. All questions answered. The patient agrees to proceed.

## 2014-09-04 NOTE — Assessment & Plan Note (Signed)
Advised to reduce and eliminate his alcohol use.

## 2014-09-04 NOTE — Progress Notes (Signed)
Subjective:    Patient ID: Patrick [name], male    DOB: 04-09-1951, 64 y.o.   MRN: [id number]  HPI Patient is here for follow-up of colon polyps, Barrett's esophagus and alcoholic cirrhosis as well as hepatitis C.. He was seen in the fall and compounded throughout. Subsequently I found at that he needed a colonoscopy to follow for large tubulovillous adenoma removed in 2013 and needed Barrett's esophagus follow-up as well. He has been advised to stop drinking he says he is cutting down and only using wine at this time. Anywhere from a pint to a fifth a day. He has a DUI Court date next week. He says he feels reasonably well at this point. He's had an influenza vaccine in December. He has not yet had pneumonia vaccine. He says he was immunized for hepatitis B in the past. He is not using other illicit drugs at this time given the month he says. At times he says it feels like his throat swells, he is noted a bulge in the supraclavicular fossa on the left. It does not sound like he has overt dysphagia however.  No Known Allergies Outpatient Prescriptions Prior to Visit  Medication Sig Dispense Refill  . aspirin 81 MG tablet Take 81 mg by mouth daily.     Marland Kitchen atorvastatin (LIPITOR) 40 MG tablet Take 1 tablet (40 mg total) by mouth daily at 6 PM. 30 tablet 3  . carvedilol (COREG) 12.5 MG tablet Take 12.5 mg by mouth 2 (two) times daily with a meal.    . ferrous sulfate 325 (65 FE) MG tablet Take 325 mg by mouth daily with breakfast.    . lisinopril (PRINIVIL,ZESTRIL) 20 MG tablet Take 20 mg by mouth daily.     . nitroGLYCERIN (NITROSTAT) 0.4 MG SL tablet Place 1 tablet (0.4 mg total) under the tongue every 5 (five) minutes as needed for chest pain. Up to 3 doses. If pain persist then call 911 25 tablet 0  . omeprazole (PRILOSEC) 20 MG capsule TAKE ONE CAPSULE BY MOUTH EVERY DAY 30 capsule 6  . amLODipine (NORVASC) 10 MG tablet Take 10 mg by mouth daily.     Marland Kitchen HYDROcodone-acetaminophen (NORCO/VICODIN)  5-325 MG per tablet Take 1 tablet by mouth every 8 (eight) hours as needed for pain.     No facility-administered medications prior to visit.   Past Medical History  Diagnosis Date  . MI (myocardial infarction)     x3, jan, aug, nov 2011; treated at [facility] in [place]  . Sexual dysfunction   . Arthritis   . HTN (hypertension)   . Hyperlipidemia   . Chronic chest pain   . CAD (coronary artery disease)   . Thrombocytopenia   . Cataract   . SSBE (short-segment Barrett's esophagus) 11/11/11  . Colon adenomas 11/11/11    x 5, one rectal tv adenoma  . GERD (gastroesophageal reflux disease)   . 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 Barrett's esophagus 11/17/2011    Short-segment dx 11/2011   . Alcoholism 01/02/2012   Past Surgical History  Procedure Laterality Date  . Hernia repair      very young  . Balloon angioplasty, artery  03/2010  . Colonoscopy  11/11/11    Dr. Silvano Rusk  . Esophagogastroduodenoscopy  11/11/11    Dr. Silvano Rusk  . Cardiac catheterization  01/31/2011  . Coronary angioplasty    . Cataract extraction w/phaco  08/18/2012    Procedure: CATARACT EXTRACTION PHACO AND INTRAOCULAR LENS  PLACEMENT (IOC);  Surgeon: Adonis Brook, MD;  Location: [facility];  Service: Ophthalmology;  Laterality: Right;  . Cataract extraction w/phaco Left 09/22/2012    Procedure: CATARACT EXTRACTION PHACO AND INTRAOCULAR LENS PLACEMENT (IOC);  Surgeon: Adonis Brook, MD;  Location: [facility];  Service: Ophthalmology;  Laterality: Left;   History   Social History  . Marital Status: Single    Spouse Name: N/A    Number of Children: 3  . Years of Education: N/A   Occupational History  .      Unemployed   Social History Main Topics  . Smoking status: Current Every Day Smoker -- 0.25 packs/day for 45 years    Types: Cigarettes  . Smokeless tobacco: Never Used  . Alcohol Use: Yes  . Drug Use: No     Comment: Cocaine + 08/2012. Previous heroin, cocaine and marijuana abuse.   Marland Kitchen Sexual  Activity: Not on file   Other Topics Concern  . Not on file   Social History Narrative     Review of Systems As per history of present illness.    Objective:   Physical Exam General:  NAD Eyes:   anicteric Lungs:  clear Heart:  S1S2 no rubs, murmurs or gallops there is some mild right-sided JVD. Supraclavicular fossa is slightly prominent on the left but soft without lymphadenopathy. Abdomen:  soft and nontender, BS+ Ext:   no edema    Data Reviewed:  Labs from 06/02/2014 there are float in the EMR. These reflect I'll pancytopenia consistent with portal hypertension and cirrhosis. He had a slight elevation of alpha-fetoprotein that time. His pro time was normal. AST 141 ALT 99.    Assessment & Plan:  Chronic hepatitis C with cirrhosis Will look into referral for HCV Tx - may not qualify if drinking Counseled to eliminate EtOH Says he has had HBV vaccinations 1-2 years ago Had influenza vaccine this year Waiting to get pneumovax Abd [place] - screen for [place] to reduce and eliminate his alcohol use.   History of colonic polyps Will schedule for repeat colonoscopy.The risks and benefits as well as alternatives of endoscopic procedure(s) have been discussed and reviewed. All questions answered. The patient agrees to proceed.    Barrett's esophagus Schedule for EGD to follow-up Barrett's esophagus and also screen for esophageal varices. The risks and benefits as well as alternatives of endoscopic procedure(s) have been discussed and reviewed. All questions answered. The patient agrees to proceed.    CC: Elizabeth Palau, MD

## 2014-09-04 NOTE — Assessment & Plan Note (Signed)
Will schedule for repeat colonoscopy.The risks and benefits as well as alternatives of endoscopic procedure(s) have been discussed and reviewed. All questions answered. The patient agrees to proceed.

## 2014-09-04 NOTE — Patient Instructions (Addendum)
It has been recommended to you by your physician that you have a(n) EGD/Colonoscopy completed. Per your request, we did not schedule the procedure(s) today. Please contact our office at [phone] should you decide to have the procedure completed.  You have been scheduled for an abdominal ultrasound at [facility] (1st floor of hospital) on 09/07/14 at 7:00am. Please arrive 15 minutes prior to your appointment for registration. Make certain not to have anything to eat or drink  After midnight prior to your appointment. Should you need to reschedule your appointment, please contact radiology at [phone]. This test typically takes about 30 minutes to perform.  I appreciate the opportunity to care for you. Silvano Rusk, M.D., [facility]

## 2014-09-07 ENCOUNTER — Ambulatory Visit (HOSPITAL_COMMUNITY)
Admission: RE | Admit: 2014-09-07 | Discharge: 2014-09-07 | Disposition: A | Discharge status: Home / Self Care | Payer: Medicare HMO | Source: Ambulatory Visit | Attending: Internal Medicine | Admitting: Internal Medicine

## 2014-09-07 DIAGNOSIS — B182 Chronic viral hepatitis C: Secondary | ICD-10-CM | POA: Diagnosis present

## 2014-09-07 DIAGNOSIS — K7460 Unspecified cirrhosis of liver: Secondary | ICD-10-CM | POA: Insufficient documentation

## 2014-09-07 NOTE — Progress Notes (Signed)
Quick Note:  No liver tumors - good news Needs to schedule egd and colonoscopy still I think ______

## 2014-09-10 NOTE — Progress Notes (Signed)
HPI: FU coronary disease. Previously resided in [place]. Patient states he had myocardial infarction in January of 2011, August 2011 and November 2011. The first 2 occurred in the setting of cocaine use. Cath 6/12 due to continuing CP revealed EF 60-65%. Normal LM, LAD - proximal 30-40% stenosis, mid 60-70% stenosis, and distal 50% stenosis. Left cx - 40-50% mid stenosis and a 50% stenosis into the first OM branch. RCA mid vessel has 50% stenosis. The PDA has 40-50% stenosis. Medical therapy recommended. Note patient had thrombocytopenia. Hematology felt this may be related to his history of alcohol abuse and cirrhosis. He was to followup with gastroenterology as an outpatient for hepatitis C. Nuclear study 6/13 showed EF 55 and normal perfusion. Since he was last seen   Current Outpatient Prescriptions  Medication Sig Dispense Refill  . aspirin 81 MG tablet Take 81 mg by mouth daily.     Marland Kitchen atorvastatin (LIPITOR) 40 MG tablet Take 1 tablet (40 mg total) by mouth daily at 6 PM. 30 tablet 3  . carvedilol (COREG) 12.5 MG tablet Take 12.5 mg by mouth 2 (two) times daily with a meal.    . ferrous sulfate 325 (65 FE) MG tablet Take 325 mg by mouth daily with breakfast.    . lisinopril (PRINIVIL,ZESTRIL) 20 MG tablet Take 20 mg by mouth daily.     . nitroGLYCERIN (NITROSTAT) 0.4 MG SL tablet Place 1 tablet (0.4 mg total) under the tongue every 5 (five) minutes as needed for chest pain. Up to 3 doses. If pain persist then call 911 25 tablet 0  . omeprazole (PRILOSEC) 20 MG capsule TAKE ONE CAPSULE BY MOUTH EVERY DAY 30 capsule 6   No current facility-administered medications for this visit.     Past Medical History  Diagnosis Date  . MI (myocardial infarction)     x3, jan, aug, nov 2011; treated at [facility] in [place]  . Sexual dysfunction   . Arthritis   . HTN (hypertension)   . Hyperlipidemia   . Chronic chest pain   . CAD (coronary artery disease)   . Thrombocytopenia   . Cataract   .  SSBE (short-segment Barrett's esophagus) 11/11/11  . Colon adenomas 11/11/11    x 5, one rectal tv adenoma  . GERD (gastroesophageal reflux disease)   . 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 Barrett's esophagus 11/17/2011    Short-segment dx 11/2011   . Alcoholism 01/02/2012    Past Surgical History  Procedure Laterality Date  . Hernia repair      very young  . Balloon angioplasty, artery  03/2010  . Colonoscopy  11/11/11    Dr. Silvano Rusk  . Esophagogastroduodenoscopy  11/11/11    Dr. Silvano Rusk  . Cardiac catheterization  01/31/2011  . Coronary angioplasty    . Cataract extraction w/phaco  08/18/2012    Procedure: CATARACT EXTRACTION PHACO AND INTRAOCULAR LENS PLACEMENT (IOC);  Surgeon: Adonis Brook, MD;  Location: [facility];  Service: Ophthalmology;  Laterality: Right;  . Cataract extraction w/phaco Left 09/22/2012    Procedure: CATARACT EXTRACTION PHACO AND INTRAOCULAR LENS PLACEMENT (IOC);  Surgeon: Adonis Brook, MD;  Location: [facility];  Service: Ophthalmology;  Laterality: Left;    History   Social History  . Marital Status: Single    Spouse Name: N/A    Number of Children: 3  . Years of Education: N/A   Occupational History  .      Unemployed   Social History  Main Topics  . Smoking status: Current Every Day Smoker -- 0.25 packs/day for 45 years    Types: Cigarettes  . Smokeless tobacco: Never Used  . Alcohol Use: Yes  . Drug Use: No     Comment: Cocaine + 08/2012. Previous heroin, cocaine and marijuana abuse.   Marland Kitchen Sexual Activity: Not on file   Other Topics Concern  . Not on file   Social History Narrative    ROS: no fevers or chills, productive cough, hemoptysis, dysphasia, odynophagia, melena, hematochezia, dysuria, hematuria, rash, seizure activity, orthopnea, PND, pedal edema, claudication. Remaining systems are negative.  Physical Exam: Well-developed well-nourished in no acute distress.  Skin is warm and dry.  HEENT is normal.  Neck is supple.  Chest  is clear to auscultation with normal expansion.  Cardiovascular exam is regular rate and rhythm.  Abdominal exam nontender or distended. No masses palpated. Extremities show no edema. neuro grossly intact  ECG     This encounter was created in error - please disregard.

## 2014-09-11 ENCOUNTER — Encounter: Payer: Medicare HMO | Admitting: Cardiology

## 2015-02-08 ENCOUNTER — Encounter: Payer: Self-pay | Admitting: Internal Medicine

## 2015-02-13 ENCOUNTER — Encounter: Payer: Self-pay | Admitting: Internal Medicine

## 2015-02-28 ENCOUNTER — Telehealth: Payer: Self-pay | Admitting: Internal Medicine

## 2015-02-28 NOTE — Telephone Encounter (Signed)
Having severe dysphagia - seems new since 2/16  Was supposed to have an EGD then but has not done so yet.  Needs an evaluation by me or APP by next week please   Patient in Dr. Ailene Rud ofc now but i told them we would call the patient

## 2015-03-01 NOTE — Telephone Encounter (Signed)
Left message for patient to call back  

## 2015-03-01 NOTE — Telephone Encounter (Signed)
Patient will come in and see Dr. Carlean Purl tomorrow at 8:30

## 2015-03-02 ENCOUNTER — Encounter: Payer: Self-pay | Admitting: Internal Medicine

## 2015-03-02 ENCOUNTER — Observation Stay (HOSPITAL_COMMUNITY)
Admission: EM | Admit: 2015-03-02 | Discharge: 2015-03-03 | Disposition: A | Discharge status: Home / Self Care | Payer: Medicare HMO | Attending: Cardiology | Admitting: Cardiology

## 2015-03-02 ENCOUNTER — Emergency Department (HOSPITAL_COMMUNITY): Payer: Medicare HMO

## 2015-03-02 ENCOUNTER — Ambulatory Visit (INDEPENDENT_AMBULATORY_CARE_PROVIDER_SITE_OTHER): Payer: Medicare HMO | Admitting: Internal Medicine

## 2015-03-02 ENCOUNTER — Encounter (HOSPITAL_COMMUNITY): Payer: Self-pay | Admitting: *Deleted

## 2015-03-02 VITALS — BP 108/54 | HR 83 | Ht 68.5 in | Wt 158.5 lb

## 2015-03-02 DIAGNOSIS — K2270 Barrett's esophagus without dysplasia: Secondary | ICD-10-CM | POA: Diagnosis not present

## 2015-03-02 DIAGNOSIS — H269 Unspecified cataract: Secondary | ICD-10-CM | POA: Insufficient documentation

## 2015-03-02 DIAGNOSIS — E785 Hyperlipidemia, unspecified: Secondary | ICD-10-CM | POA: Insufficient documentation

## 2015-03-02 DIAGNOSIS — Z7982 Long term (current) use of aspirin: Secondary | ICD-10-CM | POA: Insufficient documentation

## 2015-03-02 DIAGNOSIS — Z720 Tobacco use: Secondary | ICD-10-CM | POA: Diagnosis not present

## 2015-03-02 DIAGNOSIS — Z8619 Personal history of other infectious and parasitic diseases: Secondary | ICD-10-CM | POA: Insufficient documentation

## 2015-03-02 DIAGNOSIS — K219 Gastro-esophageal reflux disease without esophagitis: Secondary | ICD-10-CM | POA: Insufficient documentation

## 2015-03-02 DIAGNOSIS — M1990 Unspecified osteoarthritis, unspecified site: Secondary | ICD-10-CM | POA: Insufficient documentation

## 2015-03-02 DIAGNOSIS — D696 Thrombocytopenia, unspecified: Secondary | ICD-10-CM | POA: Diagnosis not present

## 2015-03-02 DIAGNOSIS — Z9889 Other specified postprocedural states: Secondary | ICD-10-CM | POA: Diagnosis not present

## 2015-03-02 DIAGNOSIS — G8929 Other chronic pain: Secondary | ICD-10-CM | POA: Diagnosis not present

## 2015-03-02 DIAGNOSIS — I200 Unstable angina: Secondary | ICD-10-CM

## 2015-03-02 DIAGNOSIS — I2510 Atherosclerotic heart disease of native coronary artery without angina pectoris: Secondary | ICD-10-CM | POA: Insufficient documentation

## 2015-03-02 DIAGNOSIS — I252 Old myocardial infarction: Secondary | ICD-10-CM | POA: Insufficient documentation

## 2015-03-02 DIAGNOSIS — R634 Abnormal weight loss: Secondary | ICD-10-CM

## 2015-03-02 DIAGNOSIS — R072 Precordial pain: Secondary | ICD-10-CM

## 2015-03-02 DIAGNOSIS — Z79899 Other long term (current) drug therapy: Secondary | ICD-10-CM | POA: Insufficient documentation

## 2015-03-02 DIAGNOSIS — R079 Chest pain, unspecified: Secondary | ICD-10-CM | POA: Diagnosis present

## 2015-03-02 DIAGNOSIS — I10 Essential (primary) hypertension: Secondary | ICD-10-CM | POA: Diagnosis not present

## 2015-03-02 DIAGNOSIS — R1310 Dysphagia, unspecified: Secondary | ICD-10-CM

## 2015-03-02 DIAGNOSIS — Z86018 Personal history of other benign neoplasm: Secondary | ICD-10-CM | POA: Insufficient documentation

## 2015-03-02 DIAGNOSIS — R9431 Abnormal electrocardiogram [ECG] [EKG]: Secondary | ICD-10-CM | POA: Insufficient documentation

## 2015-03-02 LAB — CBC WITH DIFFERENTIAL/PLATELET
Basophils Absolute: 0 10*3/uL (ref 0.0–0.1)
Basophils Relative: 1 % (ref 0–1)
Eosinophils Absolute: 0.1 10*3/uL (ref 0.0–0.7)
Eosinophils Relative: 2 % (ref 0–5)
HCT: 30.3 % — ABNORMAL LOW (ref 39.0–52.0)
Hemoglobin: 11 g/dL — ABNORMAL LOW (ref 13.0–17.0)
Lymphocytes Relative: 34 % (ref 12–46)
Lymphs Abs: 1.2 10*3/uL (ref 0.7–4.0)
MCH: 36.7 pg — ABNORMAL HIGH (ref 26.0–34.0)
MCHC: 36.3 g/dL — ABNORMAL HIGH (ref 30.0–36.0)
MCV: 101 fL — ABNORMAL HIGH (ref 78.0–100.0)
Monocytes Absolute: 0.5 10*3/uL (ref 0.1–1.0)
Monocytes Relative: 14 % — ABNORMAL HIGH (ref 3–12)
NEUTROS ABS: 1.9 10*3/uL (ref 1.7–7.7)
NEUTROS PCT: 50 % (ref 43–77)
PLATELETS: 108 10*3/uL — AB (ref 150–400)
RBC: 3 MIL/uL — ABNORMAL LOW (ref 4.22–5.81)
RDW: 17.5 % — ABNORMAL HIGH (ref 11.5–15.5)
WBC: 3.7 10*3/uL — ABNORMAL LOW (ref 4.0–10.5)

## 2015-03-02 LAB — BASIC METABOLIC PANEL
Anion gap: 9 (ref 5–15)
BUN: <5 mg/dL — ABNORMAL LOW (ref 6–20)
CALCIUM: 8.6 mg/dL — AB (ref 8.9–10.3)
CO2: 27 mmol/L (ref 22–32)
Chloride: 99 mmol/L — ABNORMAL LOW (ref 101–111)
Creatinine, Ser: 0.91 mg/dL (ref 0.61–1.24)
GFR calc Af Amer: >60 mL/min (ref >60)
Glucose, Bld: 119 mg/dL — ABNORMAL HIGH (ref 65–99)
POTASSIUM: 3.8 mmol/L (ref 3.5–5.1)
SODIUM: 135 mmol/L (ref 135–145)

## 2015-03-02 LAB — I-STAT TROPONIN, ED: TROPONIN I, POC: 0.01 ng/mL (ref 0.00–0.08)

## 2015-03-02 LAB — TROPONIN I: Troponin I: <0.03 ng/mL (ref <0.031)

## 2015-03-02 LAB — RAPID URINE DRUG SCREEN, HOSP PERFORMED
AMPHETAMINES: NOT DETECTED
BARBITURATES: NOT DETECTED
BENZODIAZEPINES: NOT DETECTED
Cocaine: NOT DETECTED
Opiates: NOT DETECTED
Tetrahydrocannabinol: NOT DETECTED

## 2015-03-02 LAB — TSH: TSH: 1.096 u[IU]/mL (ref 0.350–4.500)

## 2015-03-02 MED ORDER — ASPIRIN 81 MG PO TABS: 81.0000 mg | ORAL_TABLET | Freq: Every day | ORAL | Status: DC

## 2015-03-02 MED ORDER — CARVEDILOL 12.5 MG PO TABS: 12.5000 mg | ORAL_TABLET | Freq: Two times a day (BID) | ORAL | Status: DC

## 2015-03-02 MED ORDER — ONDANSETRON HCL 4 MG/2ML IJ SOLN: 4.0000 mg | Freq: Four times a day (QID) | INTRAMUSCULAR | Status: DC | PRN

## 2015-03-02 MED ORDER — ATORVASTATIN CALCIUM 40 MG PO TABS: 40.0000 mg | ORAL_TABLET | Freq: Every day | ORAL | Status: DC

## 2015-03-02 MED ORDER — GI COCKTAIL ~~LOC~~
30.0000 mL | Freq: Once | ORAL | Status: AC
  Administered 2015-03-02: 30 mL via ORAL
  Filled 2015-03-02: qty 30

## 2015-03-02 MED ORDER — ASPIRIN EC 81 MG PO TBEC
81.0000 mg | DELAYED_RELEASE_TABLET | Freq: Every day | ORAL | Status: DC
Start: 2015-03-03 — End: 2015-03-03
  Administered 2015-03-03: 81 mg via ORAL
  Filled 2015-03-02: qty 1

## 2015-03-02 MED ORDER — ACETAMINOPHEN 325 MG PO TABS: 650.0000 mg | ORAL_TABLET | ORAL | Status: DC | PRN

## 2015-03-02 MED ORDER — ENOXAPARIN SODIUM 80 MG/0.8ML ~~LOC~~ SOLN
1.0000 mg/kg | Freq: Two times a day (BID) | SUBCUTANEOUS | Status: DC
Start: 2015-03-02 — End: 2015-03-03
  Filled 2015-03-02: qty 0.8

## 2015-03-02 MED ORDER — PANTOPRAZOLE SODIUM 40 MG PO TBEC
40.0000 mg | DELAYED_RELEASE_TABLET | Freq: Every day | ORAL | Status: DC
  Administered 2015-03-03: 40 mg via ORAL
  Filled 2015-03-02: qty 1

## 2015-03-02 MED ORDER — LISINOPRIL 20 MG PO TABS
20.0000 mg | ORAL_TABLET | Freq: Every day | ORAL | Status: DC
  Administered 2015-03-03: 20 mg via ORAL
  Filled 2015-03-02: qty 1

## 2015-03-02 MED ORDER — NITROGLYCERIN 0.4 MG SL SUBL: 0.4000 mg | SUBLINGUAL_TABLET | SUBLINGUAL | Status: DC | PRN

## 2015-03-02 MED ORDER — FERROUS SULFATE 325 (65 FE) MG PO TABS
325.0000 mg | ORAL_TABLET | Freq: Every day | ORAL | Status: DC
  Administered 2015-03-03: 325 mg via ORAL
  Filled 2015-03-02: qty 1

## 2015-03-02 NOTE — ED Notes (Signed)
Cardiology at the bedside.

## 2015-03-02 NOTE — ED Notes (Signed)
Attempted report 

## 2015-03-02 NOTE — ED Provider Notes (Signed)
CSN: [id number]     Arrival date & time 03/02/15  4098 History   First MD Initiated Contact with Patient 03/02/15 0945     Chief Complaint  Patient presents with  . Chest Pain  . Dysphagia     (Consider location/radiation/quality/duration/timing/severity/associated sxs/prior Treatment) Patient is a 64 y.o. male presenting with chest pain. The history is provided by the patient and medical records.  Chest Pain   This is a 64 y.o. M with hx of HTN, HLP, chronic chest pain, CAD with MI x3 (2 in the setting of cocaine use), GERD, hep C with cirrhosis, barrett's esophagus, alcoholism, presenting to the ED for chest pain and dysphagia. Patient was seen by his GI physician, Dr. Carlean Purl, for difficulty swallowing.  Patient states over the past few weeks he has been having difficulty swallowing, notably thick, solid foods like meat. He has no difficulty swallowing liquids. He states he often has to cough his food back up. He has occasional vomiting in the morning. Patient was recommended to have an EGD and colonoscopy earlier this year, however he did not schedule these appointments. While in the GI office he began having an episode of central chest pain and was sent here for further evaluation. Patient reports this chest pain has been ongoing for the past several weeks as well.  No known alleviating or exacerbating factors. Localized to central chest region, at worse it is 4-5/10, now it is currently 1/10.  He denies any shortness of breath, palpitations, dizziness, or weakness.  Patient is an occasional smoker. Patient is followed by cardiology, Dr. Stanford Breed.  Patient has hx of cocaine use, denies recent drug use.  He did take ASA and NTG PTA.  VSS.  Past Medical History  Diagnosis Date  . MI (myocardial infarction)     x3, jan, aug, nov 2011; treated at [facility] in [facility]  . Sexual dysfunction   . Arthritis   . HTN (hypertension)   . Hyperlipidemia   . Chronic chest pain   . CAD (coronary artery  disease)   . Thrombocytopenia   . Cataract   . SSBE (short-segment Barrett's esophagus) 11/11/11  . Colon adenomas 11/11/11    x 5, one rectal tv adenoma  . GERD (gastroesophageal reflux disease)   . 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 Barrett's esophagus 11/17/2011    Short-segment dx 11/2011   . Alcoholism 01/02/2012   Past Surgical History  Procedure Laterality Date  . Hernia repair      very young  . Balloon angioplasty, artery  03/2010  . Colonoscopy  11/11/11    Dr. Silvano Rusk  . Esophagogastroduodenoscopy  11/11/11    Dr. Silvano Rusk  . Cardiac catheterization  01/31/2011  . Coronary angioplasty    . Cataract extraction w/phaco  08/18/2012    Procedure: CATARACT EXTRACTION PHACO AND INTRAOCULAR LENS PLACEMENT (IOC);  Surgeon: Adonis Brook, MD;  Location: [facility];  Service: Ophthalmology;  Laterality: Right;  . Cataract extraction w/phaco Left 09/22/2012    Procedure: CATARACT EXTRACTION PHACO AND INTRAOCULAR LENS PLACEMENT (IOC);  Surgeon: Adonis Brook, MD;  Location: [facility];  Service: Ophthalmology;  Laterality: Left;   Family History  Problem Relation Age of Onset  . Cancer      mother's side/fathers side, 2 uncles  . Heart attack Maternal Grandfather   . Diabetes Sister   . Stroke Sister   . Colon cancer Neg Hx   . Esophageal cancer Neg Hx   . Rectal cancer Neg  Hx   . Stomach cancer Neg Hx    History  Substance Use Topics  . Smoking status: Current Every Day Smoker -- 0.25 packs/day for 45 years    Types: Cigarettes  . Smokeless tobacco: Never Used  . Alcohol Use: Yes    Review of Systems  Cardiovascular: Positive for chest pain.  Gastrointestinal:       Dysphagia  All other systems reviewed and are negative.     Allergies  Review of patient's allergies indicates no known allergies.  Home Medications   Prior to Admission medications   Medication Sig Start Date End Date Taking? Authorizing Provider  aspirin 81 MG tablet Take 81 mg by mouth daily.      Historical Provider, MD  atorvastatin (LIPITOR) 40 MG tablet Take 1 tablet (40 mg total) by mouth daily at 6 PM. 11/30/12   Timmothy Euler, MD  carvedilol (COREG) 12.5 MG tablet Take 12.5 mg by mouth 2 (two) times daily with a meal. 09/05/12   Brooke O Edmisten, PA-C  ferrous sulfate 325 (65 FE) MG tablet Take 325 mg by mouth daily with breakfast.    Historical Provider, MD  lisinopril (PRINIVIL,ZESTRIL) 20 MG tablet Take 20 mg by mouth daily.     Historical Provider, MD  nitroGLYCERIN (NITROSTAT) 0.4 MG SL tablet Place 1 tablet (0.4 mg total) under the tongue every 5 (five) minutes as needed for chest pain. Up to 3 doses. If pain persist then call 911 05/25/13   Lelon Perla, MD  omeprazole (PRILOSEC) 20 MG capsule TAKE ONE CAPSULE BY MOUTH EVERY DAY 12/06/12   Gatha Mayer, MD   BP 123/77 mmHg  Pulse 74  Temp(Src) 98 F (36.7 C) (Oral)  Resp 16  Ht 5\' 9"  (1.753 m)  Wt 158 lb (71.668 kg)  BMI 23.32 kg/m2  SpO2 97%   Physical Exam  Constitutional: He is oriented to person, place, and time. He appears well-developed and well-nourished.  HENT:  Head: Normocephalic and atraumatic.  Mouth/Throat: Oropharynx is clear and moist.  Handling secretions well, no apparent difficulty swallowing secretions or liquids currently  Eyes: Conjunctivae and EOM are normal. Pupils are equal, round, and reactive to light.  Neck: Normal range of motion. Neck supple.  Cardiovascular: Normal rate, regular rhythm and normal heart sounds.   Pulmonary/Chest: Effort normal and breath sounds normal. No respiratory distress. He has no wheezes.  Abdominal: Soft. Bowel sounds are normal. There is no tenderness. There is no guarding.  Musculoskeletal: Normal range of motion.  Neurological: He is alert and oriented to person, place, and time.  Skin: Skin is warm and dry.  Psychiatric: He has a normal mood and affect.  Nursing note and vitals reviewed.   ED Course  Procedures (including critical care  time) Labs Review Labs Reviewed  CBC WITH DIFFERENTIAL/PLATELET - Abnormal; Notable for the following:    WBC 3.7 (*)    RBC 3.00 (*)    Hemoglobin 11.0 (*)    HCT 30.3 (*)    MCV 101.0 (*)    MCH 36.7 (*)    MCHC 36.3 (*)    RDW 17.5 (*)    Platelets 108 (*)    Monocytes Relative 14 (*)    All other components within normal limits  BASIC METABOLIC PANEL - Abnormal; Notable for the following:    Chloride 99 (*)    Glucose, Bld 119 (*)    BUN <5 (*)    Calcium 8.6 (*)  All other components within normal limits  URINE RAPID DRUG SCREEN, HOSP PERFORMED  I-STAT TROPOININ, ED    Imaging Review Dg Chest 2 View  03/02/2015   CLINICAL DATA:  Chest pain.  EXAM: CHEST  2 VIEW  COMPARISON:  November 29, 2012.  FINDINGS: The heart size and mediastinal contours are within normal limits. Both lungs are clear. No pneumothorax or pleural effusion is noted. Old right rib fractures are noted.  IMPRESSION: No active cardiopulmonary disease.   Electronically Signed   By: Marijo Conception, M.D.   On: 03/02/2015 10:41     EKG Interpretation   Date/Time:  Friday March 02 2015 09:59:32 EDT Ventricular Rate:  76 PR Interval:  138 QRS Duration: 86 QT Interval:  448 QTC Calculation: 504 R Axis:   5 Text Interpretation:  Sinus rhythm Ventricular bigeminy Abnormal R-wave  progression, early transition Abnormal T, consider ischemia, diffuse leads  Prolonged QT interval Artifact Possible infero-lateral ST/T wave  abnormality Serial tracing suggested Confirmed by WENTZ  MD, ELLIOTT  ([id number]) on 03/02/2015 10:09:16 AM      MDM   Final diagnoses:  Chest pain  Dysphagia   64 y.o. M here with chest pain and difficulty swallowing.  Both issues ongoing for the past months.  Sent here from GI office for further evaluation.  EKG with new inferior-lateral t-wave inversions.  CP seems to be intermittent without real trigger.  Lab work here is reassuring, trop negative.  CXR is clear.  Patient has no apparent  difficulty handling his secretions, drinking oral fluids without difficulty here.  CP seems atypical and may be GI related, however given his history and new EKG changes feel that cardiology input is warranted.  Patient will most likely need further GI work-up as well.  Cardiology has evaluated patient and elected to admit for further management.  Larene Pickett, PA-C 03/02/15 Bruce, MD 03/02/15 [phone]

## 2015-03-02 NOTE — Progress Notes (Signed)
Pt arrived to floor from ED, alert and oriented X4, ambulatory, states he has upper abd pain and chest pain when coughing, denies pain or discomfort at present. Edward Qualia RN

## 2015-03-02 NOTE — H&P (Signed)
Cardiologist: Irvan Tiedt is an 64 y.o. male.   Chief Complaint: Chest pain HPI:   Patient is a 64 year old male with a history of myocardial infarction 3 in 2011, hypertension, hyperlipidemia, thrombocytopenia, GERD, chronic hepatitis C with cirrhosis, Barrett's esophagus, alcoholism, , tobacco abuse, cocaine use.  Last saw him at [facility] 09/03/2012 at which time he was having chest pain in the setting of cocaine use.  He had heart catheterization June 2012 which revealed an LAD with 30-40% proximal stenosis, 60-70% mid stenosis and 50% distal stenosis. Left circumflex was 40-50% mid stenosis and 50% stenosis in the first OM branch. The RCA with it mid 50% stenosis PDA is 40-50% stenosis.  He had a Myoview stress test in 2013 which revealed no ischemia.  The patient presents with chest pain.  He was at Dr. Celesta Aver office being worked up for dysphagia when he reported that he had been having chest pain. This is been off and on for the last 3-4 weeks. He was having it last night when he went to bed and again this morning when he woke up. At the worst it's 2-3 out of 10 and feels like a knot in his chest. There is no associated shortness of breath diaphoresis or radiation. He's been having frequent nausea but is typically after his been swallowing something. His last use of cocaine was a month ago. He took a nitroglycerin at home this morning and it did nothing however, his nitroglycerin is fairly old. When EMS gave him a nitroglycerin he said it helped just a little bit.  He is not eating well because of his dysphagia has lost 10 pounds.  The patient currently denies fever, shortness of breath, orthopnea, dizziness, PND, cough, congestion, abdominal pain, hematochezia, melena, lower extremity edema, claudication.   Past Medical History  Diagnosis Date  . MI (myocardial infarction)     x3, jan, aug, nov 2011; treated at [facility] in [place]  . Sexual dysfunction   . Arthritis   .  HTN (hypertension)   . Hyperlipidemia   . Chronic chest pain   . CAD (coronary artery disease)   . Thrombocytopenia   . Cataract   . SSBE (short-segment Barrett's esophagus) 11/11/11  . Colon adenomas 11/11/11    x 5, one rectal tv adenoma  . GERD (gastroesophageal reflux disease)   . 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 Barrett's esophagus 11/17/2011    Short-segment dx 11/2011   . Alcoholism 01/02/2012    Past Surgical History  Procedure Laterality Date  . Hernia repair      very young  . Balloon angioplasty, artery  03/2010  . Colonoscopy  11/11/11    Dr. Silvano Rusk  . Esophagogastroduodenoscopy  11/11/11    Dr. Silvano Rusk  . Cardiac catheterization  01/31/2011  . Coronary angioplasty    . Cataract extraction w/phaco  08/18/2012    Procedure: CATARACT EXTRACTION PHACO AND INTRAOCULAR LENS PLACEMENT (IOC);  Surgeon: Adonis Brook, MD;  Location: [facility];  Service: Ophthalmology;  Laterality: Right;  . Cataract extraction w/phaco Left 09/22/2012    Procedure: CATARACT EXTRACTION PHACO AND INTRAOCULAR LENS PLACEMENT (IOC);  Surgeon: Adonis Brook, MD;  Location: [facility];  Service: Ophthalmology;  Laterality: Left;    Family History  Problem Relation Age of Onset  . Cancer      mother's side/fathers side, 2 uncles  . Heart attack Maternal Grandfather   . Diabetes Sister   . Stroke Sister   .  Colon cancer Neg Hx   . Esophageal cancer Neg Hx   . Rectal cancer Neg Hx   . Stomach cancer Neg Hx    Social History:  reports that he has been smoking Cigarettes.  He has a 11.25 pack-year smoking history. He has never used smokeless tobacco. He reports that he drinks alcohol. He reports that he does not use illicit drugs.  Allergies: No Known Allergies   (Not in a hospital admission)  Results for orders placed or performed during the hospital encounter of 03/02/15 (from the past 48 hour(s))  CBC with Differential     Status: Abnormal   Collection Time: 03/02/15 10:08 AM  Result  Value Ref Range   WBC 3.7 (L) 4.0 - 10.5 K/uL   RBC 3.00 (L) 4.22 - 5.81 MIL/uL   Hemoglobin 11.0 (L) 13.0 - 17.0 g/dL   HCT 30.3 (L) 39.0 - 52.0 %   MCV 101.0 (H) 78.0 - 100.0 fL   MCH 36.7 (H) 26.0 - 34.0 pg   MCHC 36.3 (H) 30.0 - 36.0 g/dL   RDW 17.5 (H) 11.5 - 15.5 %   Platelets 108 (L) 150 - 400 K/uL    Comment: SPECIMEN CHECKED FOR CLOTS REPEATED TO VERIFY    Neutrophils Relative % 50 43 - 77 %   Neutro Abs 1.9 1.7 - 7.7 K/uL   Lymphocytes Relative 34 12 - 46 %   Lymphs Abs 1.2 0.7 - 4.0 K/uL   Monocytes Relative 14 (H) 3 - 12 %   Monocytes Absolute 0.5 0.1 - 1.0 K/uL   Eosinophils Relative 2 0 - 5 %   Eosinophils Absolute 0.1 0.0 - 0.7 K/uL   Basophils Relative 1 0 - 1 %   Basophils Absolute 0.0 0.0 - 0.1 K/uL  Basic metabolic panel     Status: Abnormal   Collection Time: 03/02/15 10:08 AM  Result Value Ref Range   Sodium 135 135 - 145 mmol/L   Potassium 3.8 3.5 - 5.1 mmol/L   Chloride 99 (L) 101 - 111 mmol/L   CO2 27 22 - 32 mmol/L   Glucose, Bld 119 (H) 65 - 99 mg/dL   BUN <5 (L) 6 - 20 mg/dL   Creatinine, Ser 0.91 0.61 - 1.24 mg/dL   Calcium 8.6 (L) 8.9 - 10.3 mg/dL   GFR calc non Af Amer >60 >60 mL/min   GFR calc Af Amer >60 >60 mL/min    Comment: (NOTE) The eGFR has been calculated using the CKD EPI equation. This calculation has not been validated in all clinical situations. eGFR's persistently <60 mL/min signify possible Chronic Kidney Disease.    Anion gap 9 5 - 15  I-stat troponin, ED     Status: None   Collection Time: 03/02/15 10:48 AM  Result Value Ref Range   Troponin i, poc 0.01 0.00 - 0.08 ng/mL   Comment 3            Comment: Due to the release kinetics of cTnI, a negative result within the first hours of the onset of symptoms does not rule out myocardial infarction with certainty. If myocardial infarction is still suspected, repeat the test at appropriate intervals.    Dg Chest 2 View  03/02/2015   CLINICAL DATA:  Chest pain.  EXAM:  CHEST  2 VIEW  COMPARISON:  November 29, 2012.  FINDINGS: The heart size and mediastinal contours are within normal limits. Both lungs are clear. No pneumothorax or pleural effusion is noted. Old right  rib fractures are noted.  IMPRESSION: No active cardiopulmonary disease.   Electronically Signed   By: Marijo Conception, M.D.   On: 03/02/2015 10:41    Review of Systems  Constitutional: Negative for fever and diaphoresis.  HENT: Negative for congestion and sore throat.   Respiratory: Negative for shortness of breath.   Cardiovascular: Positive for chest pain. Negative for orthopnea, leg swelling and PND.  Gastrointestinal: Positive for nausea. Negative for vomiting, abdominal pain, blood in stool and melena.  Genitourinary: Negative for hematuria.  Musculoskeletal: Negative for myalgias.  Neurological: Negative for dizziness.  All other systems reviewed and are negative.   Blood pressure 123/77, pulse 74, temperature 98 F (36.7 C), temperature source Oral, resp. rate 16, height $RemoveBe'5\' 9"'OwmonrLDe$  (1.753 m), weight 158 lb (71.668 kg), SpO2 97 %. Physical Exam  Nursing note and vitals reviewed. Constitutional: He is oriented to person, place, and time. He appears well-developed and well-nourished. No distress.  HENT:  Head: Normocephalic and atraumatic.  Mouth/Throat: No oropharyngeal exudate.  Eyes: Conjunctivae are normal. Pupils are equal, round, and reactive to light. No scleral icterus.  Neck: Normal range of motion. Neck supple.  Cardiovascular: Normal rate, regular rhythm, S1 normal and S2 normal.   Murmur heard.  Systolic murmur is present with a grade of 1/6  Pulses:      Radial pulses are 2+ on the right side, and 2+ on the left side.       Dorsalis pedis pulses are 2+ on the right side, and 2+ on the left side.  Respiratory: Effort normal and breath sounds normal. He has no wheezes. He has no rales.  GI: Soft. Bowel sounds are normal. He exhibits no distension. There is no tenderness.   Musculoskeletal: He exhibits no edema.  Neurological: He is alert and oriented to person, place, and time. He exhibits normal muscle tone.  Skin: Skin is warm and dry.  Psychiatric: He has a normal mood and affect.     Assessment/Plan Chest pain Coronary artery disease dysphagia Hx of cocaine use.  Tobacco abuse  64 year old male with history of cocaine abuse, coronary artery disease, tobacco abuse, recent dysphagia presenting with chest pain for the last 3 weeks. The pain comes and goes and is present at rest.  EKG shows anterior ST changes/depression, which coincide with his previous LAD lesions.  He also has new T-wave inversions inferiorly.  Initial troponin is negative which is reassuring.  His chest pain does not appear to be worsened by exertion but appeared to be helped by nitroglycerin.  Recommend left heart cath.  If stenting required, use a BMS.  Will check a urine drug screen.  Platelets 108.  Can have beta blocker unless UDS +cocaine.  BP and HR controlled.   Tarri Fuller, PA-C 03/02/2015, 1:06 PM  64 yo with history of HCV and cirrhosis, CAD, cocaine abuse (none x 1 month), smoking, thrombocytopenia likely related to cirrhosis/HCV and new dysphagia was sent to ER from GI office for chest pain.  He has had symptoms for 2-3 weeks, on and off.  Lower chest/epigastric tightness without radiation.  No particular trigger, just happens on and off.  He was seeing GI for dysphagia today and was told to go to the ER because of those symptoms.  He has had trouble swallowing solids x 1 month now.  In the ER, cardiac enzymes negative.  Very mild chest pain.  ECG showed anterior T wave inversions, new from 2014.  1. Dysphagia: Patient has history  of smoking and ETOH abuse.  Possible that this is a GERD-related stricture but he is at risk for esophageal cancer.  He will need EGD to investigate this when cardiac issues are sorted through.  2. Cirrhosis: Due to HCV/ETOH.  He still drinks.  Suspect  mild thrombocytopenia is due to HCV/cirrhosis.  3. Chest pain: Atypical chest pain, comes and goes with no trigger.  Lower chest/epigastrium.  He does have a history of CAD and has anterior TWIs that are new from prior ECG in 2014.  It is certainly possible that his symptoms are GI (esophageal) and related to whatever process is causing the dysphagia. Initial troponin negative.  - He will need ischemic workup: If cardiac enzymes negative, Cardiolite tomorrow.  If enzymes positive or Cardiolite abnormal, will need cath.  Interventional plans would need to be tempered by need for EGD/dysphagia evaluation (?BMS).  - Given ?new TWIs, will use heparin gtt for now.  Plts are not markedly low.  - Continue ASA, statin.  - Check UDS: can continue Coreg as long as this is negative for cocaine.  - Encouraged to quit smoking.   Loralie Champagne 03/02/2015 2:19 PM

## 2015-03-02 NOTE — ED Notes (Signed)
Pt went to his PCP today for Dysphagia issues that has been ongoing for over one month.  While he was there he was having centralized chest pain that he has been having for a couple of weeks.  He took 1 nitro at home, 1 at the drs office, and 2 with EMS.  EMS also gave him 324mg  of ASA.  His PCP called EMS to bring him to ED for further Eval.  Pt stated that after the asa and nitro his pain was at a 3 out of 10.  VS are as follows: BP: 104/54 HR:83 SPO2: 99% on RA

## 2015-03-02 NOTE — Progress Notes (Signed)
Subjective:    Patient ID: Patrick [name], male    DOB: 11-01-1950, 64 y.o.   MRN: [id number] Chief complaint: Dysphagia and chest pain HPI The patient is a middle-aged African-American man with a history of Barrett's esophagus. He was seen in February and recommended to have surveillance of that and colonoscopy for surveillance of polyps. In the interim he did not do so and I was called earlier this week by his primary care provider stating that he was having severe dysphagia only able swallow liquids. Patient states he has been losing some weight. Today he tells [place] he is been having some substernal chest pain, he took a nitroglycerin at home that didn't help gave him 1 here and the pain is somewhat better. It is not radiating it is not associated with dyspnea or diaphoresis that I am aware. He does have a history of coronary artery disease and angina, status post angioplasty and stent placement. No Known Allergies Outpatient Prescriptions Prior to Visit  Medication Sig Dispense Refill  . aspirin 81 MG tablet Take 81 mg by mouth daily.     Marland Kitchen atorvastatin (LIPITOR) 40 MG tablet Take 1 tablet (40 mg total) by mouth daily at 6 PM. 30 tablet 3  . carvedilol (COREG) 12.5 MG tablet Take 12.5 mg by mouth 2 (two) times daily with a meal.    . ferrous sulfate 325 (65 FE) MG tablet Take 325 mg by mouth daily with breakfast.    . lisinopril (PRINIVIL,ZESTRIL) 20 MG tablet Take 20 mg by mouth daily.     . nitroGLYCERIN (NITROSTAT) 0.4 MG SL tablet Place 1 tablet (0.4 mg total) under the tongue every 5 (five) minutes as needed for chest pain. Up to 3 doses. If pain persist then call 911 25 tablet 0  . omeprazole (PRILOSEC) 20 MG capsule TAKE ONE CAPSULE BY MOUTH EVERY DAY 30 capsule 6   No facility-administered medications prior to visit.   Past Medical History  Diagnosis Date  . MI (myocardial infarction)     x3, jan, aug, nov 2011; treated at [facility] in [place]  . Sexual dysfunction   . Arthritis   .  HTN (hypertension)   . Hyperlipidemia   . Chronic chest pain   . CAD (coronary artery disease)   . Thrombocytopenia   . Cataract   . SSBE (short-segment Barrett's esophagus) 11/11/11  . Colon adenomas 11/11/11    x 5, one rectal tv adenoma  . GERD (gastroesophageal reflux disease)   . 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 Barrett's esophagus 11/17/2011    Short-segment dx 11/2011   . Alcoholism 01/02/2012   Past Surgical History  Procedure Laterality Date  . Hernia repair      very young  . Balloon angioplasty, artery  03/2010  . Colonoscopy  11/11/11    Dr. Silvano Rusk  . Esophagogastroduodenoscopy  11/11/11    Dr. Silvano Rusk  . Cardiac catheterization  01/31/2011  . Coronary angioplasty    . Cataract extraction w/phaco  08/18/2012    Procedure: CATARACT EXTRACTION PHACO AND INTRAOCULAR LENS PLACEMENT (IOC);  Surgeon: Adonis Brook, MD;  Location: [facility];  Service: Ophthalmology;  Laterality: Right;  . Cataract extraction w/phaco Left 09/22/2012    Procedure: CATARACT EXTRACTION PHACO AND INTRAOCULAR LENS PLACEMENT (IOC);  Surgeon: Adonis Brook, MD;  Location: [facility];  Service: Ophthalmology;  Laterality: Left;   History   Social History  . Marital Status: Single    Spouse Name: N/A  .  Number of Children: 3  . Years of Education: N/A   Occupational History  .      Unemployed   Social History Main Topics  . Smoking status: Current Every Day Smoker -- 0.25 packs/day for 45 years    Types: Cigarettes  . Smokeless tobacco: Never Used  . Alcohol Use: Yes  . Drug Use: No     Comment: Cocaine + 08/2012. Previous heroin, cocaine and marijuana abuse.   Marland Kitchen Sexual Activity: Not on file   Other Topics Concern  . None   Social History Narrative   Family History  Problem Relation Age of Onset  . Cancer      mother's side/fathers side, 2 uncles  . Heart attack Maternal Grandfather   . Diabetes Sister   . Stroke Sister   . Colon cancer Neg Hx   . Esophageal cancer Neg Hx    . Rectal cancer Neg Hx   . Stomach cancer Neg Hx         Review of Systems As above    Objective:   Physical Exam @BP  108/54 mmHg  Pulse 83  Ht 5' 8.5" (1.74 m)  Wt 158 lb 8 oz (71.895 kg)  BMI 23.75 kg/m2  SpO2 99%@  General:  NAD Lungs:  Clear Chest wall is nontender Heart:: S1S2 no rubs, murmurs or gallops Ext:   No cyanosis or clubbing He is alert and oriented 3 area there is a slightly flat affect.   Data Reviewed:  Wt Readings from Last 3 Encounters:  03/02/15 158 lb 8 oz (71.895 kg)  09/04/14 169 lb 8 oz (76.885 kg)  05/26/14 171 lb (77.565 kg)      Assessment & Plan:   1. Precordial pain   2. Dysphagia   3. Unintentional weight loss    The differential on his chest pain is cardiac versus esophageal. It may well be esophageal but he has a history of cardiac problems and I have recommended that he go to the hospital by ambulance for further evaluation. I suspect he will need admission and rule out, and then depending but what happens there further evaluation of his dysphagia with either barium swallow or EGD or both. Given his Barrett's esophagus the unintentional weight loss and the progressive dysphagia I am concerned about esophageal cancer in this patient.  Cc:Dr. Lillia Corporal

## 2015-03-03 ENCOUNTER — Observation Stay (HOSPITAL_COMMUNITY): Payer: Medicare HMO

## 2015-03-03 DIAGNOSIS — R1310 Dysphagia, unspecified: Secondary | ICD-10-CM | POA: Diagnosis not present

## 2015-03-03 DIAGNOSIS — R072 Precordial pain: Secondary | ICD-10-CM | POA: Diagnosis not present

## 2015-03-03 DIAGNOSIS — I252 Old myocardial infarction: Secondary | ICD-10-CM | POA: Diagnosis not present

## 2015-03-03 DIAGNOSIS — R634 Abnormal weight loss: Secondary | ICD-10-CM | POA: Diagnosis not present

## 2015-03-03 DIAGNOSIS — R1314 Dysphagia, pharyngoesophageal phase: Secondary | ICD-10-CM | POA: Diagnosis not present

## 2015-03-03 DIAGNOSIS — I10 Essential (primary) hypertension: Secondary | ICD-10-CM | POA: Diagnosis not present

## 2015-03-03 DIAGNOSIS — R9431 Abnormal electrocardiogram [ECG] [EKG]: Secondary | ICD-10-CM | POA: Insufficient documentation

## 2015-03-03 DIAGNOSIS — R079 Chest pain, unspecified: Secondary | ICD-10-CM | POA: Diagnosis not present

## 2015-03-03 LAB — BASIC METABOLIC PANEL
Anion gap: 6 (ref 5–15)
CHLORIDE: 100 mmol/L — AB (ref 101–111)
CO2: 27 mmol/L (ref 22–32)
Calcium: 8.3 mg/dL — ABNORMAL LOW (ref 8.9–10.3)
Creatinine, Ser: 0.85 mg/dL (ref 0.61–1.24)
GFR calc Af Amer: >60 mL/min (ref >60)
GFR calc non Af Amer: >60 mL/min (ref >60)
Glucose, Bld: 103 mg/dL — ABNORMAL HIGH (ref 65–99)
POTASSIUM: 3.6 mmol/L (ref 3.5–5.1)
Sodium: 133 mmol/L — ABNORMAL LOW (ref 135–145)

## 2015-03-03 LAB — LIPID PANEL
CHOLESTEROL: 126 mg/dL (ref 0–200)
HDL: 13 mg/dL — ABNORMAL LOW (ref >40)
LDL CALC: 94 mg/dL (ref 0–99)
Total CHOL/HDL Ratio: 9.7 RATIO
Triglycerides: 97 mg/dL (ref <150)
VLDL: 19 mg/dL (ref 0–40)

## 2015-03-03 LAB — NM MYOCAR MULTI W/SPECT W/WALL MOTION / EF
LV dias vol: 52 mL
LVSYSVOL: 27 mL
RATE: 0.32
SDS: 1
SRS: 6
SSS: 7
TID: 0.88

## 2015-03-03 LAB — CBC
HCT: 29.5 % — ABNORMAL LOW (ref 39.0–52.0)
Hemoglobin: 10.4 g/dL — ABNORMAL LOW (ref 13.0–17.0)
MCH: 36 pg — AB (ref 26.0–34.0)
MCHC: 35.3 g/dL (ref 30.0–36.0)
MCV: 102.1 fL — ABNORMAL HIGH (ref 78.0–100.0)
Platelets: 86 10*3/uL — ABNORMAL LOW (ref 150–400)
RBC: 2.89 MIL/uL — ABNORMAL LOW (ref 4.22–5.81)
RDW: 17.8 % — AB (ref 11.5–15.5)
WBC: 3.5 10*3/uL — ABNORMAL LOW (ref 4.0–10.5)

## 2015-03-03 LAB — HEMOGLOBIN A1C
HEMOGLOBIN A1C: 5.4 % (ref 4.8–5.6)
MEAN PLASMA GLUCOSE: 108 mg/dL

## 2015-03-03 LAB — TROPONIN I

## 2015-03-03 MED ORDER — REGADENOSON 0.4 MG/5ML IV SOLN
INTRAVENOUS | Status: AC
  Filled 2015-03-03: qty 5

## 2015-03-03 MED ORDER — REGADENOSON 0.4 MG/5ML IV SOLN
0.4000 mg | Freq: Once | INTRAVENOUS | Status: AC
  Administered 2015-03-03: 0.4 mg via INTRAVENOUS
  Filled 2015-03-03: qty 5

## 2015-03-03 MED ORDER — TECHNETIUM TC 99M SESTAMIBI - CARDIOLITE
30.0000 | Freq: Once | INTRAVENOUS | Status: AC | PRN
  Administered 2015-03-03: 10:00:00 30 via INTRAVENOUS

## 2015-03-03 MED ORDER — NITROGLYCERIN 0.4 MG SL SUBL: 0.4000 mg | SUBLINGUAL_TABLET | SUBLINGUAL | Status: DC | PRN

## 2015-03-03 MED ORDER — PANTOPRAZOLE SODIUM 40 MG PO TBEC: 40.0000 mg | DELAYED_RELEASE_TABLET | Freq: Two times a day (BID) | ORAL | Status: DC

## 2015-03-03 MED ORDER — TECHNETIUM TC 99M SESTAMIBI GENERIC - CARDIOLITE
10.0000 | Freq: Once | INTRAVENOUS | Status: AC | PRN
  Administered 2015-03-03: 10 via INTRAVENOUS

## 2015-03-03 NOTE — Discharge Summary (Signed)
Physician Discharge Summary  Patient ID: Patrick [name] MRN: [id number] DOB/AGE: [birth year] 64 y.o.   Primary Cardiologist: Dr. Stanford Breed  Admit date: 03/02/2015 Discharge date: 03/03/2015  Admission Diagnoses: Chest Pain  Discharge Diagnoses:  Active Problems:   Chest pain   Dysphagia   Abnormal ECG   Essential hypertension   Discharged Condition: stable  Hospital Course: 64 yo AAM with history of HCV and cirrhosis, CAD, cocaine abuse (none x 1 month), smoking, thrombocytopenia likely related to cirrhosis/HCV and new dysphagia was sent to ER from GI office 03/02/15 for chest pain. He has had symptoms for 2-3 weeks, on and off. Lower chest/epigastric tightness without radiation. No particular trigger, just happens on and off. He was seeing GI for dysphagia today of presentation and was told to go to the ER because of those symptoms. He has had trouble swallowing solids x 1 month now. In the ER, cardiac enzymes were negative. He noted very mild chest pain. ECG showed anterior T wave inversions, new from 2014. He was admitted for further work-up/observartion. Cardiac enzymes were cycled and were negative x 3. He underwent a NST which showed a defect of mild severity present in the basal inferolateral and mid inferolateral location. The defect is consistent with artifact. There is a matched area of mild attenuation involving both the inferior and lateral walls of the left ventricle which are without associated regional wall motion abnormalities. No definitive scintigraphic evidence of prior infarction or pharmacologically induced ischemia. His resting and stress images were also reviewed by Dr. Meda Coffee and she agreed with radiology that there was no ischemia. It was felt that his symptoms were noncardiac and likely GI related. He was seen by Dr. Fuller Plan of GI who recommended further GI w/u including an EGD and possibly a BA esophagram. Dr. Fuller Plan was contacted and he confirmed that his GI w/u  could be completed as an outpatient. Dr. Meda Coffee felt that the patient was stable for discharge home. He will f/u with [facility] and with Dr. Stanford Breed. No medication changes were made. His PPI was continued.     Consults: GI  Significant Diagnostic Studies:  Cardiac Panel (last 3 results)  Recent Labs  03/02/15 2035 03/03/15 0030 03/03/15 0633  TROPONINI <0.03 <0.03 <0.03   NST 03/03/15 Isotope administration Rest isotope was administered with an IV injection of 10.3 mCi Tc57m Sestamibi. Rest SPECT images were obtained approximately 45 minutes post tracer injection. Stress isotope was administered with an IV injection of 32.7 mCi Tc52m Sestamibi Stress SPECT images were obtained approximately 30 minutes post tracer injection.     Nuclear Study Quality Overall image quality is good.Diaphragmatic attenuation artifact was present.    Nuclear Measurements Study was gated.    Rest Perfusion There is a defect present in the basal inferolateral and mid inferolateral location.There is a matched area of mild attenuation involving both the inferior and lateral walls of the left ventricle which are without associated regional wall motion abnormalities. No definitive scintigraphic evidence of prior infarction or pharmacologically induced ischemia.    Stress Perfusion There is a defect present in the basal inferolateral and mid inferolateral location.There is a matched area of mild attenuation involving both the inferior and lateral walls of the left ventricle which are without associated regional wall motion abnormalities. No definitive scintigraphic evidence of prior infarction or pharmacologically induced ischemia.    Perfusion Summary Defect 1:  There is a defect of mild severity present in the basal inferolateral and mid inferolateral location. The defect is  consistent with artifact. There is a matched area of mild attenuation involving both the inferior and lateral walls of the left  ventricle which are without associated regional wall motion abnormalities. No definitive scintigraphic evidence of prior infarction or pharmacologically induced ischemia.     Treatments: See Hospital Course  Discharge Exam: Blood pressure 116/80, pulse 77, temperature 98.1 F (36.7 C), temperature source Oral, resp. rate 18, height 5\' 9"  (1.753 m), weight 155 lb 1.6 oz (70.353 kg), SpO2 98 %.   Disposition: 01-Home or Self Care      Discharge Instructions    Diet - low sodium heart healthy    Complete by:  As directed      Increase activity slowly    Complete by:  As directed             Medication List    TAKE these medications        aspirin 81 MG tablet  Take 81 mg by mouth daily.     atorvastatin 40 MG tablet  Commonly known as:  LIPITOR  Take 1 tablet (40 mg total) by mouth daily at 6 PM.     carvedilol 12.5 MG tablet  Commonly known as:  COREG  Take 12.5 mg by mouth 2 (two) times daily with a meal.     ferrous sulfate 325 (65 FE) MG tablet  Take 325 mg by mouth daily with breakfast.     lisinopril 20 MG tablet  Commonly known as:  PRINIVIL,ZESTRIL  Take 20 mg by mouth daily.     nitroGLYCERIN 0.4 MG SL tablet  Commonly known as:  NITROSTAT  Place 1 tablet (0.4 mg total) under the tongue every 5 (five) minutes as needed for chest pain. Up to 3 doses. If pain persist then call 911     omeprazole 20 MG capsule  Commonly known as:  PRILOSEC  TAKE ONE CAPSULE BY MOUTH EVERY DAY       Follow-up Information    Call Silvano Rusk, MD.   Specialty:  Gastroenterology   Why:  call for appt with Dr. Carlean Purl on Monday to complete GI studies    Contact information:   [address]  [address] [phone]       Follow up with Kirk Ruths, MD.   Specialty:  Cardiology   Why:  our office will call you with a follow-up appointment with Dr. Leroy Libman information:   [address] [place] [address] [place], INCLUDING PHYSICIAN TIME: >30 MINUTES  Signed: Lyda Jester 03/03/2015, 4:27 PM

## 2015-03-03 NOTE — Progress Notes (Signed)
Pt discharged to home via W/C, condition stable, printed education given with verbal understanding,  follow up appointment information given, accompanied by family friend. Edward Qualia RN

## 2015-03-03 NOTE — Progress Notes (Signed)
Patient Profile: 64 yo with history of HCV and cirrhosis, CAD, cocaine abuse (none x 1 month), smoking, thrombocytopenia likely related to cirrhosis/HCV and new dysphagia was sent to ER from GI office for chest pain. ECG showed anterior T wave inversions, new from 2014. Cardiac enzymes negative x 3.    Subjective: Still with intermittent chest pressure/throat pain. No dyspnea.   Objective: Vital signs in last 24 hours: Temp:  [98 F (36.7 C)-98.6 F (37 C)] 98.6 F (37 C) (07/30 0500) Pulse Rate:  [64-82] 78 (07/30 0500) Resp:  [10-23] 16 (07/30 0500) BP: (86-145)/(65-98) 130/87 mmHg (07/30 0500) SpO2:  [96 %-100 %] 98 % (07/30 0500) Weight:  [155 lb 1.6 oz (70.353 kg)-158 lb (71.668 kg)] 155 lb 1.6 oz (70.353 kg) (07/29 1847)    Intake/Output from previous day:   Intake/Output this shift:    Medications Current Facility-Administered Medications  Medication Dose Route Frequency Provider Last Rate Last Dose  . acetaminophen (TYLENOL) tablet 650 mg  650 mg Oral Q4H PRN Brett Canales, PA-C      . aspirin EC tablet 81 mg  81 mg Oral Daily Brett Canales, PA-C   81 mg at 03/03/15 0758  . atorvastatin (LIPITOR) tablet 40 mg  40 mg Oral q1800 Brett Canales, PA-C      . carvedilol (COREG) tablet 12.5 mg  12.5 mg Oral BID WC Brett Canales, PA-C   12.5 mg at 03/03/15 0800  . enoxaparin (LOVENOX) injection 70 mg  1 mg/kg Subcutaneous Q12H Brett Canales, PA-C   70 mg at 03/02/15 2200  . ferrous sulfate tablet 325 mg  325 mg Oral Q breakfast Brett Canales, PA-C   325 mg at 03/03/15 0757  . lisinopril (PRINIVIL,ZESTRIL) tablet 20 mg  20 mg Oral Daily Brett Canales, PA-C   20 mg at 03/03/15 0758  . nitroGLYCERIN (NITROSTAT) SL tablet 0.4 mg  0.4 mg Sublingual Q5 Min x 3 PRN Brett Canales, PA-C      . ondansetron [facility]) injection 4 mg  4 mg Intravenous Q6H PRN Brett Canales, PA-C      . pantoprazole (PROTONIX) EC tablet 40 mg  40 mg Oral Daily Brett Canales, PA-C   40 mg at 03/03/15 0757     PE: General appearance: alert, cooperative and no distress Neck: no carotid bruit and no JVD Lungs: clear to auscultation bilaterally Heart: regular rate and rhythm, S1, S2 normal, no murmur, click, rub or gallop Extremities: no LEE Pulses: 2+ and symmetric Skin: warm and dry Neurologic: Grossly normal  Lab Results:   Recent Labs  03/02/15 1008 03/03/15 0633  WBC 3.7* 3.5*  HGB 11.0* 10.4*  HCT 30.3* 29.5*  PLT 108* PENDING   BMET  Recent Labs  03/02/15 1008 03/03/15 0633  NA 135 133*  K 3.8 3.6  CL 99* 100*  CO2 27 27  GLUCOSE 119* 103*  BUN <5* <5*  CREATININE 0.91 0.85  CALCIUM 8.6* 8.3*   PT/INR No results for input(s): LABPROT, INR in the last 72 hours. Cholesterol  Recent Labs  03/03/15 0253  CHOL 126   Cardiac Panel (last 3 results)  Recent Labs  03/02/15 2035 03/03/15 0030 03/03/15 0633  TROPONINI <0.03 <0.03 <0.03    Studies/Results: NST - pending  ECG: SR, negative T waves in the inferior and anterior leads.   Assessment/Plan    Active Problems:   Chest pain   1. Chest Pain: cardiac enzymes negative  x 3. NST completed. Had mild n/v during stress portion but no EKG changes. Radiologist interpretation pending.   2. HTN: hypertensive response during stress test also after nausea and vomiting. Continue coreg and lisinopril.   Brittainy M. Rosita Fire, PA-C 03/03/2015 9:15 AM  The patient was seen, examined and discussed with Brittainy M. Rosita Fire, PA-C and I agree with the above.   The patient is currently asymptomatic, awaiting results of the stress test, if negative we will discharge home. No signs of CHF.   Dorothy Spark 03/03/2015

## 2015-03-03 NOTE — Discharge Instructions (Signed)

## 2015-03-03 NOTE — Progress Notes (Signed)
    Progress Note   Subjective  Chest pain returned this morning.    Objective  Vital signs in last 24 hours: Temp:  [98.1 F (36.7 C)-98.6 F (37 C)] 98.6 F (37 C) (07/30 0500) Pulse Rate:  [64-118] 118 (07/30 0932) Resp:  [10-23] 20 (07/30 0932) BP: (86-168)/(65-111) 168/111 mmHg (07/30 0932) SpO2:  [96 %-100 %] 98 % (07/30 0500) Weight:  [155 lb 1.6 oz (70.353 kg)] 155 lb 1.6 oz (70.353 kg) (07/29 1847)    General: Alert, well-developed,  in NAD Heart:  Regular rate and rhythm; no murmurs Chest: Clear to ascultation bilaterally Abdomen:  Soft, nontender and nondistended. Normal bowel sounds, without guarding, and without rebound.   Extremities:  Without edema. Neurologic:  Alert and  oriented x4; grossly normal neurologically. Psych:  Alert and cooperative. Normal mood and affect.  Intake/Output from previous day:   Intake/Output this shift: Total I/O In: 0  Out: 125 [Urine:125]  Lab Results:  Recent Labs  03/02/15 1008 03/03/15 0633  WBC 3.7* 3.5*  HGB 11.0* 10.4*  HCT 30.3* 29.5*  PLT 108* 86*   BMET  Recent Labs  03/02/15 1008 03/03/15 0633  NA 135 133*  K 3.8 3.6  CL 99* 100*  CO2 27 27  GLUCOSE 119* 103*  BUN <5* <5*  CREATININE 0.91 0.85  CALCIUM 8.6* 8.3*     Studies/Results: Dg Chest 2 View  03/02/2015   CLINICAL DATA:  Chest pain.  EXAM: CHEST  2 VIEW  COMPARISON:  November 29, 2012.  FINDINGS: The heart size and mediastinal contours are within normal limits. Both lungs are clear. No pneumothorax or pleural effusion is noted. Old right rib fractures are noted.  IMPRESSION: No active cardiopulmonary disease.   Electronically Signed   By: Marijo Conception, M.D.   On: 03/02/2015 10:41      Assessment & Plan   1. Chest pain. Cardiac enzymes neg X 3. NST done this morning. Cardiac evaluation in progress.   2. Dysphagia and weight loss. History of Barrett's esophagus. Will need EGD, BA esophagram or both when cardiac status allows. PPI bid  for now.      Pricilla Riffle. Fuller Plan  03/03/2015, 11:00 AM Pager [phone] 8a-5p weekdays Call [phone] weekends, holidays and 5p-8a or per Amion

## 2015-03-05 ENCOUNTER — Telehealth: Payer: Self-pay | Admitting: Cardiology

## 2015-03-05 ENCOUNTER — Telehealth: Payer: Self-pay

## 2015-03-05 NOTE — Telephone Encounter (Signed)
Called patient's home number - spoke with patient's mother. She reports patient cannot eat, has not eaten in about 2-3 weeks. Patient was at GI doctor on Friday 7/29 and was sent to ED from there.  LM on mobile # to call back.

## 2015-03-05 NOTE — Telephone Encounter (Signed)
Returning your call. °

## 2015-03-05 NOTE — Telephone Encounter (Signed)
Patient notified he will come for pre-visit tomorrow and procedure on Thursday

## 2015-03-05 NOTE — Telephone Encounter (Signed)
D/C phone call .Marland Kitchen Appt on 04/02/15 at 9:30am w/ Ellen Henri  Thanks

## 2015-03-05 NOTE — Telephone Encounter (Signed)
-----   Message from Ladene Artist, MD sent at 03/03/2015  4:16 PM EDT ----- CG pt in hosp over weekend and discharged. Needs outpatient EGD very soon. If CG doesn't have any availability I can do if I have an [place] slot, even a 4pm slot, if ok with CG.

## 2015-03-05 NOTE — Telephone Encounter (Signed)
Patient is scheduled for 03/08/15 with Dr. Fuller Plan I have scheduled a pre-visit for tomorrow at 10:30  Left message for patient to call back

## 2015-03-05 NOTE — Telephone Encounter (Signed)
LMTCB

## 2015-03-05 NOTE — Telephone Encounter (Signed)
Patient contacted regarding discharge from [facility] on 03/03/2015.  Patient understands to follow up with provider B. Rosita Fire, [place] on 04/02/2015 at 0930 at [facility]. Patient understands discharge instructions? YES Patient understands medications and regiment? YES Patient understands to bring all medications to this visit? YES  Patient complains of throat issues. Has to get endoscopy.  Patient saw GI doctor 7/29 for pre-op Patient to see GI doctor this week

## 2015-03-06 ENCOUNTER — Ambulatory Visit (AMBULATORY_SURGERY_CENTER): Payer: Self-pay | Admitting: *Deleted

## 2015-03-06 VITALS — Ht 69.0 in | Wt 158.0 lb

## 2015-03-06 DIAGNOSIS — R1314 Dysphagia, pharyngoesophageal phase: Secondary | ICD-10-CM

## 2015-03-06 NOTE — Progress Notes (Signed)
No egg or soy.  No anesthesia problems.  No home oxygen.  History ETOH.  EF 48%.

## 2015-03-08 ENCOUNTER — Encounter: Payer: Self-pay | Admitting: Gastroenterology

## 2015-03-08 ENCOUNTER — Ambulatory Visit (AMBULATORY_SURGERY_CENTER): Payer: Medicare HMO | Admitting: Gastroenterology

## 2015-03-08 VITALS — BP 121/77 | HR 80 | Temp 96.6°F | Resp 27 | Ht 69.0 in | Wt 158.0 lb

## 2015-03-08 DIAGNOSIS — R634 Abnormal weight loss: Secondary | ICD-10-CM | POA: Diagnosis not present

## 2015-03-08 DIAGNOSIS — K2270 Barrett's esophagus without dysplasia: Secondary | ICD-10-CM | POA: Diagnosis present

## 2015-03-08 DIAGNOSIS — K2950 Unspecified chronic gastritis without bleeding: Secondary | ICD-10-CM

## 2015-03-08 DIAGNOSIS — R079 Chest pain, unspecified: Secondary | ICD-10-CM

## 2015-03-08 DIAGNOSIS — R1319 Other dysphagia: Secondary | ICD-10-CM

## 2015-03-08 DIAGNOSIS — R1310 Dysphagia, unspecified: Secondary | ICD-10-CM

## 2015-03-08 MED ORDER — OMEPRAZOLE 20 MG PO CPDR
20.0000 mg | DELAYED_RELEASE_CAPSULE | Freq: Two times a day (BID) | ORAL | Status: DC
Start: 2015-03-08 — End: 2015-06-29

## 2015-03-08 MED ORDER — SODIUM CHLORIDE 0.9 % IV SOLN: 500.0000 mL | INTRAVENOUS | Status: DC

## 2015-03-08 NOTE — Progress Notes (Signed)
Called to room to assist during endoscopic procedure.  Patient ID and intended procedure confirmed with present staff. Received instructions for my participation in the procedure from the performing physician.  

## 2015-03-08 NOTE — Op Note (Signed)
[facility]  [address] [place], [address]   ENDOSCOPY PROCEDURE REPORT  PATIENT: Patrick, [name]  MR#: [id number] BIRTHDATE: Nov 10, 1950 , 64  yrs. old GENDER: male ENDOSCOPIST: Ladene Artist, MD, [facility] PROCEDURE DATE:  03/08/2015 PROCEDURE:  EGD w/ wire guided (savary) dilation and EGD w/ biopsy  ASA CLASS:     Class III INDICATIONS:  dysphagia, chest pain, history of Barrett's esophagus, and weight loss. MEDICATIONS: Monitored anesthesia care and Propofol 150 mg IV TOPICAL ANESTHETIC: none DESCRIPTION OF PROCEDURE: After the risks benefits and alternatives of the procedure were thoroughly explained, informed consent was obtained.  The LB [id number] endoscope was introduced through the mouth and advanced to the second portion of the duodenum , Without limitations.  The instrument was slowly withdrawn as the mucosa was fully examined.    ESOPHAGUS: There was a 1cm segment of Barrett's esophagus found in the distal esophagus.  Multiple biopsies were performed.  The esophagus was dilated using a 21mm (48Fr) savary dilator over guidewire for dysphagia without a stricture. No varices noted. The esophagus otherwise appeared normal. STOMACH: Abnormal mucosa was found in the gastric body and gastric antrum.  There was patchy erythema and a few small nodules. Multiple biopsies were performed.   The stomach otherwise appeared normal. DUODENUM: The duodenal mucosa showed no abnormalities in the bulb and 2nd part of the duodenum.  Retroflexed views revealed no abnormalities.   The scope was then withdrawn from the patient and the procedure completed.  COMPLICATIONS: There were no immediate complications.  ENDOSCOPIC IMPRESSION: 1.   Barrett's esophagus in the distal esophagus; multiple biopsies performed; dilation using a savary dilator over guidewire 2.   Abnormal mucosa in the gastric body and antrum; possible gastritis, portal gastropathy; multiple  biopsies  performed 3.   The EGD otherwise appeared normal  RECOMMENDATIONS: 1.  Await pathology results 2.  Anti-reflux regimen 3.  PPI: omeprazole 20 mg po bid, 3 months of refills 4.  Post dilation instructions 5.  Follow-up in 2-3 weeks with Dr. Carlean Purl  eSigned:  Ladene Artist, MD, [facility] 03/08/2015 3:17 PM

## 2015-03-08 NOTE — Patient Instructions (Signed)
Discharge instructions given. Biopsies taken. Resume previous medications. Prescription sent in to pharmacy. Handout on a dilatation diet. YOU HAD AN ENDOSCOPIC PROCEDURE TODAY AT [facility]:   Refer to the procedure report that was given to you for any specific questions about what was found during the examination.  If the procedure report does not answer your questions, please call your gastroenterologist to clarify.  If you requested that your care partner not be given the details of your procedure findings, then the procedure report has been included in a sealed envelope for you to review at your convenience later.  YOU SHOULD EXPECT: Some feelings of bloating in the abdomen. Passage of more gas than usual.  Walking can help get rid of the air that was put into your GI tract during the procedure and reduce the bloating. If you had a lower endoscopy (such as a colonoscopy or flexible sigmoidoscopy) you may notice spotting of blood in your stool or on the toilet paper. If you underwent a bowel prep for your procedure, you may not have a normal bowel movement for a few days.  Please Note:  You might notice some irritation and congestion in your nose or some drainage.  This is from the oxygen used during your procedure.  There is no need for concern and it should clear up in a day or so.  SYMPTOMS TO REPORT IMMEDIATELY:    Following upper endoscopy (EGD)  Vomiting of blood or coffee ground material  New chest pain or pain under the shoulder blades  Painful or persistently difficult swallowing  New shortness of breath  Fever of 100F or higher  Black, tarry-looking stools  For urgent or emergent issues, a gastroenterologist can be reached at any hour by calling [phone].   DIET: Your first meal following the procedure should be a small meal and then it is ok to progress to your normal diet. Heavy or fried foods are harder to digest and may make you feel nauseous or  bloated.  Likewise, meals heavy in dairy and vegetables can increase bloating.  Drink plenty of fluids but you should avoid alcoholic beverages for 24 hours.  ACTIVITY:  You should plan to take it easy for the rest of today and you should NOT DRIVE or use heavy machinery until tomorrow (because of the sedation medicines used during the test).    FOLLOW UP: Our staff will call the number listed on your records the next business day following your procedure to check on you and address any questions or concerns that you may have regarding the information given to you following your procedure. If we do not reach you, we will leave a message.  However, if you are feeling well and you are not experiencing any problems, there is no need to return our call.  We will assume that you have returned to your regular daily activities without incident.  If any biopsies were taken you will be contacted by phone or by letter within the next 1-3 weeks.  Please call us at [phone] if you have not heard about the biopsies in 3 weeks.    SIGNATURES/CONFIDENTIALITY: You and/or your care partner have signed paperwork which will be entered into your electronic medical record.  These signatures attest to the fact that that the information above on your After Visit Summary has been reviewed and is understood.  Full responsibility of the confidentiality of this discharge information lies with you and/or your care-partner.

## 2015-03-08 NOTE — Progress Notes (Signed)
Transferred to recovery room. A/O x3, pleased with MAC.  VSS.  Report to [place], Therapist, sports.

## 2015-03-09 ENCOUNTER — Telehealth: Payer: Self-pay | Admitting: Gastroenterology

## 2015-03-09 NOTE — Telephone Encounter (Signed)
Patient reports that he is having a sore "scrathchy "  throat after EGD with dilation yesterday with Dr. Fuller Plan.  He denies any new or different chest pains (he was recently discharged for chest pain last week).  Reports that he actually feels a little better with his swallowing.  He is scheduled for a follow up with Dr. Carlean Purl for 03/30/15.  He is reassured that he may have a scratchy throat for a few days.  He is advised to drink room temperature fluids and call back for any worsening symptome.  Dr. Henrene Pastor you are Doc of the day.  Would you please review and advise if he needs any additional orders or treatments in Dr. Fuller Plan absence.

## 2015-03-09 NOTE — Telephone Encounter (Signed)
Reviewed. Simple sore or scratchy throat not uncommon after dilation procedure. As long as he is not having severe pain or chest pain, supportive care as you have recommended

## 2015-03-19 ENCOUNTER — Encounter: Payer: Self-pay | Admitting: Gastroenterology

## 2015-03-20 ENCOUNTER — Emergency Department (HOSPITAL_COMMUNITY)
Admission: EM | Admit: 2015-03-20 | Discharge: 2015-03-20 | Disposition: A | Discharge status: Home / Self Care | Payer: Medicare HMO | Attending: Emergency Medicine | Admitting: Emergency Medicine

## 2015-03-20 ENCOUNTER — Encounter (HOSPITAL_COMMUNITY): Payer: Self-pay | Admitting: Emergency Medicine

## 2015-03-20 DIAGNOSIS — Z7982 Long term (current) use of aspirin: Secondary | ICD-10-CM | POA: Diagnosis not present

## 2015-03-20 DIAGNOSIS — I2510 Atherosclerotic heart disease of native coronary artery without angina pectoris: Secondary | ICD-10-CM | POA: Insufficient documentation

## 2015-03-20 DIAGNOSIS — K219 Gastro-esophageal reflux disease without esophagitis: Secondary | ICD-10-CM | POA: Insufficient documentation

## 2015-03-20 DIAGNOSIS — D692 Other nonthrombocytopenic purpura: Secondary | ICD-10-CM | POA: Insufficient documentation

## 2015-03-20 DIAGNOSIS — G8929 Other chronic pain: Secondary | ICD-10-CM | POA: Diagnosis not present

## 2015-03-20 DIAGNOSIS — I10 Essential (primary) hypertension: Secondary | ICD-10-CM | POA: Insufficient documentation

## 2015-03-20 DIAGNOSIS — I252 Old myocardial infarction: Secondary | ICD-10-CM | POA: Diagnosis not present

## 2015-03-20 DIAGNOSIS — Z79899 Other long term (current) drug therapy: Secondary | ICD-10-CM | POA: Insufficient documentation

## 2015-03-20 DIAGNOSIS — R200 Anesthesia of skin: Secondary | ICD-10-CM | POA: Diagnosis present

## 2015-03-20 DIAGNOSIS — E785 Hyperlipidemia, unspecified: Secondary | ICD-10-CM | POA: Insufficient documentation

## 2015-03-20 DIAGNOSIS — Z720 Tobacco use: Secondary | ICD-10-CM | POA: Insufficient documentation

## 2015-03-20 DIAGNOSIS — R202 Paresthesia of skin: Secondary | ICD-10-CM | POA: Diagnosis not present

## 2015-03-20 LAB — CBC WITH DIFFERENTIAL/PLATELET
BASOS ABS: 0 10*3/uL (ref 0.0–0.1)
BASOS PCT: 1 % (ref 0–1)
EOS ABS: 0 10*3/uL (ref 0.0–0.7)
EOS PCT: 1 % (ref 0–5)
HCT: 29 % — ABNORMAL LOW (ref 39.0–52.0)
Hemoglobin: 10.4 g/dL — ABNORMAL LOW (ref 13.0–17.0)
LYMPHS PCT: 30 % (ref 12–46)
Lymphs Abs: 1.1 10*3/uL (ref 0.7–4.0)
MCH: 37 pg — ABNORMAL HIGH (ref 26.0–34.0)
MCHC: 35.9 g/dL (ref 30.0–36.0)
MCV: 103.2 fL — AB (ref 78.0–100.0)
MONO ABS: 0.5 10*3/uL (ref 0.1–1.0)
Monocytes Relative: 14 % — ABNORMAL HIGH (ref 3–12)
Neutro Abs: 1.9 10*3/uL (ref 1.7–7.7)
Neutrophils Relative %: 54 % (ref 43–77)
Platelets: 83 10*3/uL — ABNORMAL LOW (ref 150–400)
RBC: 2.81 MIL/uL — AB (ref 4.22–5.81)
RDW: 17.9 % — AB (ref 11.5–15.5)
WBC: 3.6 10*3/uL — ABNORMAL LOW (ref 4.0–10.5)

## 2015-03-20 LAB — PROTIME-INR
INR: 1.26 (ref 0.00–1.49)
Prothrombin Time: 15.9 seconds — ABNORMAL HIGH (ref 11.6–15.2)

## 2015-03-20 LAB — COMPREHENSIVE METABOLIC PANEL
ALT: 28 U/L (ref 17–63)
AST: 61 U/L — AB (ref 15–41)
Albumin: 2.6 g/dL — ABNORMAL LOW (ref 3.5–5.0)
Alkaline Phosphatase: 100 U/L (ref 38–126)
Anion gap: 8 (ref 5–15)
BILIRUBIN TOTAL: 0.9 mg/dL (ref 0.3–1.2)
CALCIUM: 8.7 mg/dL — AB (ref 8.9–10.3)
CO2: 24 mmol/L (ref 22–32)
Chloride: 105 mmol/L (ref 101–111)
Creatinine, Ser: 0.84 mg/dL (ref 0.61–1.24)
GFR calc Af Amer: >60 mL/min (ref >60)
Glucose, Bld: 97 mg/dL (ref 65–99)
Potassium: 3.3 mmol/L — ABNORMAL LOW (ref 3.5–5.1)
Sodium: 137 mmol/L (ref 135–145)
TOTAL PROTEIN: 7.1 g/dL (ref 6.5–8.1)

## 2015-03-20 NOTE — ED Notes (Signed)
Pt is in stable condition upon d/c and is escorted from ED via wheelchair. 

## 2015-03-20 NOTE — ED Provider Notes (Signed)
CSN: [id number]     Arrival date & time 03/20/15  1316 History   First MD Initiated Contact with Patient 03/20/15 1717     Chief Complaint  Patient presents with  . Bleeding/Bruising  . Numbness   HPI   36 male presents today with numerous complaints. Patient reports bruising to his bilateral lower extremities, upper extremities. He notes this is been happening over the last several months, has been seen by his primary care for this, and has not been given an official diagnosis. Patient reports the spots have stayed over the last several weeks. He reports they're not itchy, not painful to palpation. Patient also reports that over the last week he's had "numbness" to his anterior abdomen and lower extremities. Patient reports the numbness is on the anterior aspect of his superior lower extremities, lateral aspect distal lower extremities. Patient reports this spares his groin and genitals, he has no numbness or tingling on his buttock or back. He notes that it was intermittent originally, now has stayed. Patient reports that he has sensation over those areas but it is reduced. Patient denies any headache, dizziness, neck pain, back pain, lower extremity swelling or edema. Patient notes bilateral knees are "achy", but denies any swelling or edema. Patient reports he is able to ambulate, and has strength in the lower extremities. Patient reports that he drinks alcohol, denies any drug use.  Past Medical History  Diagnosis Date  . MI (myocardial infarction)     x3, jan, aug, nov 2011; treated at [facility] in [place]  . Sexual dysfunction   . HTN (hypertension)   . Hyperlipidemia   . Chronic chest pain   . CAD (coronary artery disease)   . Thrombocytopenia   . SSBE (short-segment Barrett's esophagus) 11/11/11  . Colon adenomas 11/11/11    x 5, one rectal tv adenoma  . GERD (gastroesophageal reflux disease)   . 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 Barrett's esophagus 11/17/2011   Short-segment dx 11/2011   . Arthritis     knees  . Cataract     bil removed  . Anemia   . Alcoholism 01/02/2012    cocaine abuse, herion abuse in past   Past Surgical History  Procedure Laterality Date  . Hernia repair      very young  . Balloon angioplasty, artery  03/2010  . Colonoscopy  11/11/11    Dr. Silvano Rusk  . Esophagogastroduodenoscopy  11/11/11    Dr. Silvano Rusk  . Cardiac catheterization  01/31/2011  . Coronary angioplasty    . Cataract extraction w/phaco  08/18/2012    Procedure: CATARACT EXTRACTION PHACO AND INTRAOCULAR LENS PLACEMENT (IOC);  Surgeon: Adonis Brook, MD;  Location: [facility];  Service: Ophthalmology;  Laterality: Right;  . Cataract extraction w/phaco Left 09/22/2012    Procedure: CATARACT EXTRACTION PHACO AND INTRAOCULAR LENS PLACEMENT (IOC);  Surgeon: Adonis Brook, MD;  Location: [facility];  Service: Ophthalmology;  Laterality: Left;  . Upper gastrointestinal endoscopy     Family History  Problem Relation Age of Onset  . Cancer      mother's side/fathers side, 2 uncles  . Heart attack Maternal Grandfather   . Diabetes Sister   . Stroke Sister   . Colon cancer Neg Hx   . Esophageal cancer Neg Hx   . Rectal cancer Neg Hx   . Stomach cancer Neg Hx    Social History  Substance Use Topics  . Smoking status: Current Some Day Smoker -- 0.10 packs/day  for 45 years    Types: Cigarettes  . Smokeless tobacco: Never Used     Comment: 1-2 cig/day  . Alcohol Use: 2.4 oz/week    4 Standard drinks or equivalent per week     Comment: Per pt, "It has been 1 month that I stopped drinking every day". maw    Review of Systems  All other systems reviewed and are negative.   Allergies  Review of patient's allergies indicates no known allergies.  Home Medications   Prior to Admission medications   Medication Sig Start Date End Date Taking? Authorizing Provider  aspirin 81 MG tablet Take 81 mg by mouth daily.    Yes Historical Provider, MD  atorvastatin (LIPITOR)  40 MG tablet Take 1 tablet (40 mg total) by mouth daily at 6 PM. Patient taking differently: Take 40 mg by mouth daily.  11/30/12  Yes Timmothy Euler, MD  carvedilol (COREG) 12.5 MG tablet Take 12.5 mg by mouth 2 (two) times daily with a meal. 09/05/12  Yes Brooke O Edmisten, PA-C  ferrous sulfate 325 (65 FE) MG tablet Take 325 mg by mouth daily with breakfast.   Yes Historical Provider, MD  lisinopril (PRINIVIL,ZESTRIL) 20 MG tablet Take 20 mg by mouth daily.    Yes Historical Provider, MD  omeprazole (PRILOSEC) 20 MG capsule Take 1 capsule (20 mg total) by mouth 2 (two) times daily. 03/08/15  Yes Ladene Artist, MD  nitroGLYCERIN (NITROSTAT) 0.4 MG SL tablet Place 1 tablet (0.4 mg total) under the tongue every 5 (five) minutes as needed for chest pain. 03/03/15   Brittainy Erie Noe, PA-C  omeprazole (PRILOSEC) 20 MG capsule TAKE ONE CAPSULE BY MOUTH EVERY DAY Patient not taking: Reported on 03/06/2015 12/06/12   Gatha Mayer, MD   BP 141/83 mmHg  Pulse 38  Temp(Src) 97.9 F (36.6 C) (Oral)  Resp 10  Ht 5\' 9"  (1.753 m)  Wt 161 lb 14.4 oz (73.437 kg)  BMI 23.90 kg/m2  SpO2 100%   Physical Exam  Constitutional: He is oriented to person, place, and time. He appears well-developed and well-nourished.  HENT:  Head: Normocephalic and atraumatic.  Mouth/Throat: Uvula is midline.  No teeth on top, no abnormality of the mouth, no bruising or bleeding noted  Eyes: Conjunctivae are normal. Pupils are equal, round, and reactive to light. Right eye exhibits no discharge. Left eye exhibits no discharge. No scleral icterus.  Neck: Normal range of motion. No JVD present. No tracheal deviation present.  Cardiovascular: Normal rate, regular rhythm, normal heart sounds and intact distal pulses.   Pulmonary/Chest: Effort normal. No stridor.  Abdominal: Soft. There is no tenderness.  Musculoskeletal: Normal range of motion. He exhibits no tenderness.  Scant edema to the feet and ankles bilateral. Patient  has no tenderness to his neck back hips or lower extremities, full active range of motion, strength 5 out of 5  Neurological: He is alert and oriented to person, place, and time. He has normal strength. A sensory deficit is present. No cranial nerve deficit. Coordination and gait normal. GCS eye subscore is 4. GCS verbal subscore is 5. GCS motor subscore is 6.  Reflex Scores:      Patellar reflexes are 1+ on the right side and 1+ on the left side. Decreased sensation to the anterior thighs, lateral distal lower extremities. Normal sensation throughout the remainder of lower extremity. Decreased sensation to the anterior abdomen. Remainder of dermatomes intact.  Psychiatric: He has a normal mood and  affect. His behavior is normal. Judgment and thought content normal.  Nursing note and vitals reviewed.   ED Course  Procedures (including critical care time) Labs Review Labs Reviewed  COMPREHENSIVE METABOLIC PANEL - Abnormal; Notable for the following:    Potassium 3.3 (*)    BUN <5 (*)    Calcium 8.7 (*)    Albumin 2.6 (*)    AST 61 (*)    All other components within normal limits  CBC WITH DIFFERENTIAL/PLATELET - Abnormal; Notable for the following:    WBC 3.6 (*)    RBC 2.81 (*)    Hemoglobin 10.4 (*)    HCT 29.0 (*)    MCV 103.2 (*)    MCH 37.0 (*)    RDW 17.9 (*)    Platelets 83 (*)    Monocytes Relative 14 (*)    All other components within normal limits  PROTIME-INR - Abnormal; Notable for the following:    Prothrombin Time 15.9 (*)    All other components within normal limits    Imaging Review No results found. I have personally reviewed and evaluated these images and lab results as part of my medical decision-making.   EKG Interpretation None      MDM   Final diagnoses:  Purpura  Paresthesia    Labs: CMP, CBC, PT/INR: Significant for PT 15.9, platelets 83, hemoglobin 10.4  Imaging:  Consults:  Therapeutics:  Discharge Meds:   Assessment/Plan: Patient  presents with numerous complaints. He has purpura, this is been present for an extended period time, he is being followed by his primary care for this. Review of labs showed that he has a hemoglobin of 10.4 today this is unchanged from his last hemoglobin of 10.4 on 03/03/2015. Patient has active signs of bleeding. Platelets are slightly decreased at 83 from 86 and 03/03/2015; patient has numerous platelets significantly lower. Patient's paresthesia is unusual as it does not follow a specific dermatome. He reports this has been intermittent, now has stayed. Patient has no significant back pain, or strength deficits, fever, weight loss, loss of bowel or bladder function or control, or any other concerning signs or symptoms today. Patient will be discharged home with instructions to follow-up with his primary care provider within the next 2 days for reevaluation and further management of these ongoing conditions. Patient given strict return precautions, verbalized understanding and agreement today's plan has no further questions or concerns at time of discharge.          Okey Regal, PA-C 03/20/15 [place], MD 03/20/15 [phone]

## 2015-03-20 NOTE — Discharge Instructions (Signed)
Please contact her primary care provider and schedule follow-up evaluation for further evaluation and management. Please monitor for new or worsening signs or symptoms, return immediately if any present.

## 2015-03-20 NOTE — ED Notes (Addendum)
Numbness from chest down into front thighs and knees and shins; swelling bilateral extremities starting this morning; cardiac problems. Purpura on left side- arms and leg and bilateral hands. Denies chest pain and SOB. Had cardiac workup in ED a week ago and did not show anything.

## 2015-03-30 ENCOUNTER — Encounter: Payer: Self-pay | Admitting: Internal Medicine

## 2015-03-30 ENCOUNTER — Ambulatory Visit (INDEPENDENT_AMBULATORY_CARE_PROVIDER_SITE_OTHER): Payer: Medicare HMO | Admitting: Internal Medicine

## 2015-03-30 ENCOUNTER — Other Ambulatory Visit (INDEPENDENT_AMBULATORY_CARE_PROVIDER_SITE_OTHER): Payer: Medicare HMO

## 2015-03-30 VITALS — BP 122/74 | HR 80 | Ht 68.5 in

## 2015-03-30 DIAGNOSIS — D692 Other nonthrombocytopenic purpura: Secondary | ICD-10-CM | POA: Diagnosis not present

## 2015-03-30 DIAGNOSIS — D539 Nutritional anemia, unspecified: Secondary | ICD-10-CM

## 2015-03-30 DIAGNOSIS — B182 Chronic viral hepatitis C: Secondary | ICD-10-CM

## 2015-03-30 DIAGNOSIS — R200 Anesthesia of skin: Secondary | ICD-10-CM | POA: Diagnosis not present

## 2015-03-30 DIAGNOSIS — K7460 Unspecified cirrhosis of liver: Secondary | ICD-10-CM

## 2015-03-30 DIAGNOSIS — R29898 Other symptoms and signs involving the musculoskeletal system: Secondary | ICD-10-CM

## 2015-03-30 DIAGNOSIS — Z860100 Personal history of colon polyps, unspecified: Secondary | ICD-10-CM

## 2015-03-30 LAB — COMPREHENSIVE METABOLIC PANEL
ALT: 17 U/L (ref 0–53)
AST: 40 U/L — ABNORMAL HIGH (ref 0–37)
Albumin: 3 g/dL — ABNORMAL LOW (ref 3.5–5.2)
Alkaline Phosphatase: 99 U/L (ref 39–117)
BUN: 2 mg/dL — AB (ref 6–23)
CHLORIDE: 99 meq/L (ref 96–112)
CO2: 31 meq/L (ref 19–32)
Calcium: 9 mg/dL (ref 8.4–10.5)
Creatinine, Ser: 0.73 mg/dL (ref 0.40–1.50)
GFR: 138.95 mL/min (ref >60.00)
GLUCOSE: 102 mg/dL — AB (ref 70–99)
POTASSIUM: 3.7 meq/L (ref 3.5–5.1)
SODIUM: 136 meq/L (ref 135–145)
TOTAL PROTEIN: 7.5 g/dL (ref 6.0–8.3)
Total Bilirubin: 0.9 mg/dL (ref 0.2–1.2)

## 2015-03-30 LAB — CBC WITH DIFFERENTIAL/PLATELET
Basophils Absolute: 0 10*3/uL (ref 0.0–0.1)
Basophils Relative: 0.4 % (ref 0.0–3.0)
EOS PCT: 1.6 % (ref 0.0–5.0)
Eosinophils Absolute: 0.1 10*3/uL (ref 0.0–0.7)
HCT: 32.7 % — ABNORMAL LOW (ref 39.0–52.0)
Hemoglobin: 11.1 g/dL — ABNORMAL LOW (ref 13.0–17.0)
LYMPHS ABS: 1.5 10*3/uL (ref 0.7–4.0)
Lymphocytes Relative: 35.7 % (ref 12.0–46.0)
MCHC: 33.9 g/dL (ref 30.0–36.0)
MCV: 107.9 fl — ABNORMAL HIGH (ref 78.0–100.0)
MONO ABS: 0.4 10*3/uL (ref 0.1–1.0)
Monocytes Relative: 10.5 % (ref 3.0–12.0)
NEUTROS PCT: 51.8 % (ref 43.0–77.0)
Neutro Abs: 2.2 10*3/uL (ref 1.4–7.7)
Platelets: 110 10*3/uL — ABNORMAL LOW (ref 150.0–400.0)
RBC: 3.03 Mil/uL — AB (ref 4.22–5.81)
RDW: 18.4 % — AB (ref 11.5–15.5)
WBC: 4.3 10*3/uL (ref 4.0–10.5)

## 2015-03-30 LAB — FERRITIN: Ferritin: 750 ng/mL — ABNORMAL HIGH (ref 22.0–322.0)

## 2015-03-30 LAB — VITAMIN B12: VITAMIN B 12: 515 pg/mL (ref 211–911)

## 2015-03-30 NOTE — Patient Instructions (Addendum)
  You have been scheduled for a colonoscopy. Please follow written instructions given to you at your visit today.  Please pick up your prep supplies at the pharmacy within the next 1-3 days. If you use inhalers (even only as needed), please bring them with you on the day of your procedure.   Your physician has requested that you go to the basement for the lab work before leaving today.   Take 1-2 doses of Miralax daily.  Stop your iron medicine per Dr Carlean Purl.   Your physician has requested that you go to the basement for the lab work before leaving today.   I appreciate the opportunity to care for you.

## 2015-03-30 NOTE — Progress Notes (Signed)
   Subjective:    Patient ID: Patrick [name], male    DOB: May 03, 1951, 64 y.o.   MRN: [id number] Cc: constipation, abdominal pain HPI  Here for f/u - when last seen he had chest pain and was sent to ED, was also having dtysphagia and weight loss. Cardiac eval was negative. EGD negative, empiric dilation performed - dysphagia is better. Appetite better  He has other sx - weak in LE's, using a cane Numbness thoarax-abd into LE's Constipated since hospiatlization Medications, allergies, past medical history, past surgical history, family history and social history are reviewed and updated in the EMR.   Review of Systems As above + spots in skin LE's    Objective:   Physical Exam _0  122/74 mmHg  Pulse 80  Ht 5' 8.5" (1.74 m)@  General:  NAD Eyes:   anicteric Lungs:  clear Heart:: S1S2 no rubs, murmurs or gallops Abdomen:  soft and nontender, BS+ Ext:   Trace bilateral  edema, no cyanosis or clubbing, no petechiae in nails digits NL hands Skin:  Purpuric changes LE's Neuro:  CN 2-12 intact UE strength 4+/5 bi;lat LE strength 3+/5 bilat decreased sensation to light touch in LE's Alert and oriented x 3  Data Reviewed:   EGD, path Nuc med stress test DC summary Labs from hospitalization     Assessment & Plan:  Weakness of both lower extremities Numbness - Purpura  Chronic hepatitis C with cirrhosis - Plan: Comp Met (CMET), Cryoglobulin,  Hx of colonic polyp Macrocytic anemia - Plan: Ferritin, B12, CBC w/Diff, Cryoglobulin,    Cause of problems not clear will w/u anemia and numbness w/ labs above. ? If he could have cryoglobulinemia. B12 def could be issue as well. If unhelpful will ask neuro to see. ? Heme eval  Still planning for a colonoscopy - Sep 8 hx polyps The risks and benefits as well as alternatives of endoscopic procedure(s) have been discussed and reviewed. All questions answered. The patient agrees to proceed.  DC ferrous sulfate  Need to query - is he  drinking When in hospital last cocaine reported 1 month PTA

## 2015-04-02 ENCOUNTER — Encounter: Payer: Self-pay | Admitting: Cardiology

## 2015-04-02 ENCOUNTER — Ambulatory Visit (INDEPENDENT_AMBULATORY_CARE_PROVIDER_SITE_OTHER): Payer: Medicare HMO | Admitting: Cardiology

## 2015-04-02 VITALS — BP 132/86 | HR 80 | Ht 69.0 in | Wt 160.0 lb

## 2015-04-02 DIAGNOSIS — R0789 Other chest pain: Secondary | ICD-10-CM | POA: Diagnosis not present

## 2015-04-02 MED ORDER — FUROSEMIDE 20 MG PO TABS: ORAL_TABLET | ORAL | Status: DC

## 2015-04-02 NOTE — Patient Instructions (Addendum)
Your physician has recommended you make the following change in your medication: you have been started on furosemide 20 mg ( fluild pill). Take 1 tablet daily for the next 2 days then only as needed for swelling. This has already been sent to your [facility].  Your physician wants you to follow-up in: 6 months or sooner if needed with Dr. Stanford Breed. You will receive a reminder letter in the mail two months in advance. If you don't receive a letter, please call our office to schedule the follow-up appointment.

## 2015-04-02 NOTE — Progress Notes (Signed)
04/02/2015 Patrick [name]   12-08-1950  [id number]  Primary Physician PROVIDER NOT IN SYSTEM Primary Cardiologist: Dr. Stanford Breed   Reason for Visit/CC: Post hospital follow-up for chest pain  HPI:   The patient is a 64 yo AAM, followed by Dr. Stanford Breed, with a history of HCV and cirrhosis, CAD, cocaine abuse (none x 1 month), smoking, thrombocytopenia likely related to cirrhosis/HCV and new dysphagia, who was recently sent to ER from GI office 03/02/15 for chest pain. He had noted symptoms for 2-3 weeks, on and off, described as lower chest/epigastric tightness without radiation. No particular trigger, just happens on and off. He was seeing Dr. Leroy Kennedy, gastroenterologist, in clinic for dysphagia when he developed symptoms and was told to go to the ER for further evaluation to rule out cardiac etiology. In the ER, troponin was negative. ECG showed anterior T wave inversions, new from 2014. He was admitted for further work-up/observartion. Cardiac enzymes were cycled and were negative x 3. He underwent a NST which showed a defect of mild severity present in the basal inferolateral and mid inferolateral location. The defect was consistent with artifact. There was a matched area of mild attenuation involving both the inferior and lateral walls of the left ventricle which was without associated regional wall motion abnormalities. No definitive scintigraphic evidence of prior infarction or pharmacologically induced ischemia. EF was estimated at 43%. His resting and stress images were also reviewed by Dr. Meda Coffee and she agreed with radiology that there was no ischemia. It was felt that his symptoms were noncardiac and likely GI related. He was seen by Dr. Fuller Plan of GI who recommended further GI w/u including an EGD and possibly a BA esophagram. Dr. Fuller Plan was contacted and he confirmed that his GI w/u could be completed as an outpatient. Dr. Meda Coffee felt that the patient was stable for discharge home.   He  now presents to clinic for post hospital follow-up. He was seen by Dr. Leroy Kennedy 03/30/15. EGD was negative. Empiric dilation was performed. Colonoscopy scheduled for 04/12/15. Since undergoing esophageal dilatation, the patient notes some improvement with swallowing solids, but he still reports that this has not completely returned to normal. He continues to have throat and neck discomfort with solid foods. No difficulties ingesting liquids.   He also complain of bilateral LEE/fluid retention. He denies LE pain, dyspnea, orthopnea, and PND. He is not particuarly on a strict low sodium diet, but denies any excessive sodium intake. He is not currently on a diuretic.    Current Outpatient Prescriptions  Medication Sig Dispense Refill  . aspirin 81 MG tablet Take 81 mg by mouth daily.     Marland Kitchen atorvastatin (LIPITOR) 40 MG tablet Take 1 tablet (40 mg total) by mouth daily at 6 PM. (Patient taking differently: Take 40 mg by mouth daily. ) 30 tablet 3  . carvedilol (COREG) 12.5 MG tablet Take 12.5 mg by mouth 2 (two) times daily with a meal.    . lisinopril (PRINIVIL,ZESTRIL) 20 MG tablet Take 20 mg by mouth daily.     . nitroGLYCERIN (NITROSTAT) 0.4 MG SL tablet Place 1 tablet (0.4 mg total) under the tongue every 5 (five) minutes as needed for chest pain. 25 tablet 2  . omeprazole (PRILOSEC) 20 MG capsule Take 1 capsule (20 mg total) by mouth 2 (two) times daily. 60 capsule 3  . polyethylene glycol (MIRALAX / GLYCOLAX) packet Take 17 g by mouth once.     No current facility-administered medications for this visit.  No Known Allergies  Social History   Social History  . Marital Status: Single    Spouse Name: N/A  . Number of Children: 3  . Years of Education: N/A   Occupational History  .      Unemployed   Social History Main Topics  . Smoking status: Current Some Day Smoker -- 0.10 packs/day for 45 years    Types: Cigarettes  . Smokeless tobacco: Never Used     Comment: 1-2 cig/day  .  Alcohol Use: 2.4 oz/week    4 Standard drinks or equivalent per week     Comment: Per pt, "It has been 1 month that I stopped drinking every day". maw  . Drug Use: No     Comment: Cocaine + 08/2012. Previous heroin, cocaine and marijuana abuse.   Marland Kitchen Sexual Activity: Not on file   Other Topics Concern  . Not on file   Social History Narrative     Review of Systems: General: negative for chills, fever, night sweats or weight changes.  Cardiovascular: negative for chest pain, dyspnea on exertion, edema, orthopnea, palpitations, paroxysmal nocturnal dyspnea or shortness of breath Dermatological: negative for rash Respiratory: negative for cough or wheezing Urologic: negative for hematuria Abdominal: negative for nausea, vomiting, diarrhea, bright red blood per rectum, melena, or hematemesis Neurologic: negative for visual changes, syncope, or dizziness All other systems reviewed and are otherwise negative except as noted above.    Blood pressure 132/86, pulse 80, height 5\' 9"  (1.753 m), weight 160 lb (72.576 kg).  General appearance: alert, cooperative and no distress Neck: no carotid bruit and no JVD Lungs: clear to auscultation bilaterally Heart: regular rate and rhythm, S1, S2 normal, no murmur, click, rub or gallop Extremities: tense bilateral LEE Pulses: 2+ and symmetric Skin: warm and dry Neurologic: Grossly normal  EKG Not performed   ASSESSMENT AND PLAN:   1. Atypical CP/throat discomfort: Recent hospital workup was negative for cardiac etiologies including negative enzymes and low risk nuclear stress test. It is felt that his symptoms are GI related. Recent EGD by Dr. Leroy Kennedy was negative . Esophageal dilatation resulted in some improvement. He is scheduled for a colonoscopy September 8. Continue GI workup per Dr. Leroy Kennedy.  2. Bilateral lower extremity edema: Patient has slight tense bilateral edema on physical exam. He denies any dyspnea, orthopnea or PND. EF by recent  nuclear study was estimated at 43%. Recent comprehensive metabolic panel also revealed normal renal function. Given evidence of mild systolic dysfunction on recent inpatient cardiac workup and evidence for mild fluid retention, will prescribe low-dose Lasix to take as needed for lower extremity edema. Rx 20 mg Lasix PRN. He was also advised to avoid sodium.  3. HTN: BP is well controlled on current regimen.   PLAN  Follow-up with Dr. Stanford Breed in 6 months. Continue outpatient GI workup per Dr. Leroy Kennedy.   Lyda Jester PA-C 04/02/2015 9:50 AM

## 2015-04-04 LAB — CRYOGLOBULIN

## 2015-04-04 NOTE — Progress Notes (Signed)
Quick Note:  Labs ok Is he still drinking EtOH? Neuro referral to [facility] re: lower extremity weakness and paresthesias He should see dermatology re: LE skin lesions also please refer to Dr. Delman Cheadle or partner  Cancel colonoscopy for now  Place a recall for nov 2016 ______

## 2015-04-05 ENCOUNTER — Other Ambulatory Visit: Payer: Self-pay

## 2015-04-05 DIAGNOSIS — R29898 Other symptoms and signs involving the musculoskeletal system: Secondary | ICD-10-CM

## 2015-04-05 DIAGNOSIS — R202 Paresthesia of skin: Secondary | ICD-10-CM

## 2015-04-05 NOTE — Progress Notes (Signed)
Quick Note:  Dr. Jenny Reichmann Hall/Amber Register PA-C  Or Dr.Tafeen ______

## 2015-04-12 ENCOUNTER — Encounter: Payer: Medicare HMO | Admitting: Internal Medicine

## 2015-04-26 ENCOUNTER — Encounter (HOSPITAL_COMMUNITY): Payer: Self-pay | Admitting: Emergency Medicine

## 2015-04-26 ENCOUNTER — Inpatient Hospital Stay (HOSPITAL_COMMUNITY): Payer: Medicare HMO

## 2015-04-26 ENCOUNTER — Emergency Department (HOSPITAL_COMMUNITY): Payer: Medicare HMO

## 2015-04-26 ENCOUNTER — Inpatient Hospital Stay (HOSPITAL_COMMUNITY)
Admission: EM | Admit: 2015-04-26 | Discharge: 2015-04-30 | DRG: 073 | Disposition: A | Discharge status: Home Health | Payer: Medicare HMO | Attending: Internal Medicine | Admitting: Internal Medicine

## 2015-04-26 DIAGNOSIS — D72819 Decreased white blood cell count, unspecified: Secondary | ICD-10-CM | POA: Diagnosis present

## 2015-04-26 DIAGNOSIS — Z79899 Other long term (current) drug therapy: Secondary | ICD-10-CM

## 2015-04-26 DIAGNOSIS — Z79891 Long term (current) use of opiate analgesic: Secondary | ICD-10-CM | POA: Diagnosis not present

## 2015-04-26 DIAGNOSIS — Z7982 Long term (current) use of aspirin: Secondary | ICD-10-CM

## 2015-04-26 DIAGNOSIS — E785 Hyperlipidemia, unspecified: Secondary | ICD-10-CM | POA: Diagnosis present

## 2015-04-26 DIAGNOSIS — F1910 Other psychoactive substance abuse, uncomplicated: Secondary | ICD-10-CM | POA: Diagnosis present

## 2015-04-26 DIAGNOSIS — D539 Nutritional anemia, unspecified: Secondary | ICD-10-CM | POA: Diagnosis present

## 2015-04-26 DIAGNOSIS — D759 Disease of blood and blood-forming organs, unspecified: Secondary | ICD-10-CM | POA: Diagnosis present

## 2015-04-26 DIAGNOSIS — G621 Alcoholic polyneuropathy: Secondary | ICD-10-CM | POA: Diagnosis present

## 2015-04-26 DIAGNOSIS — F1010 Alcohol abuse, uncomplicated: Secondary | ICD-10-CM | POA: Insufficient documentation

## 2015-04-26 DIAGNOSIS — R1013 Epigastric pain: Secondary | ICD-10-CM | POA: Diagnosis not present

## 2015-04-26 DIAGNOSIS — F10231 Alcohol dependence with withdrawal delirium: Secondary | ICD-10-CM | POA: Diagnosis present

## 2015-04-26 DIAGNOSIS — F1020 Alcohol dependence, uncomplicated: Secondary | ICD-10-CM | POA: Diagnosis present

## 2015-04-26 DIAGNOSIS — R109 Unspecified abdominal pain: Secondary | ICD-10-CM | POA: Diagnosis present

## 2015-04-26 DIAGNOSIS — Y907 Blood alcohol level of 200-239 mg/100 ml: Secondary | ICD-10-CM | POA: Diagnosis present

## 2015-04-26 DIAGNOSIS — K852 Alcohol induced acute pancreatitis without necrosis or infection: Secondary | ICD-10-CM | POA: Insufficient documentation

## 2015-04-26 DIAGNOSIS — E440 Moderate protein-calorie malnutrition: Secondary | ICD-10-CM | POA: Diagnosis present

## 2015-04-26 DIAGNOSIS — I10 Essential (primary) hypertension: Secondary | ICD-10-CM | POA: Diagnosis present

## 2015-04-26 DIAGNOSIS — K7460 Unspecified cirrhosis of liver: Secondary | ICD-10-CM

## 2015-04-26 DIAGNOSIS — F17210 Nicotine dependence, cigarettes, uncomplicated: Secondary | ICD-10-CM | POA: Diagnosis present

## 2015-04-26 DIAGNOSIS — I252 Old myocardial infarction: Secondary | ICD-10-CM | POA: Diagnosis not present

## 2015-04-26 DIAGNOSIS — I509 Heart failure, unspecified: Secondary | ICD-10-CM | POA: Diagnosis not present

## 2015-04-26 DIAGNOSIS — R200 Anesthesia of skin: Secondary | ICD-10-CM | POA: Diagnosis present

## 2015-04-26 DIAGNOSIS — K7031 Alcoholic cirrhosis of liver with ascites: Secondary | ICD-10-CM | POA: Diagnosis present

## 2015-04-26 DIAGNOSIS — K2270 Barrett's esophagus without dysplasia: Secondary | ICD-10-CM | POA: Diagnosis not present

## 2015-04-26 DIAGNOSIS — Z720 Tobacco use: Secondary | ICD-10-CM

## 2015-04-26 DIAGNOSIS — C159 Malignant neoplasm of esophagus, unspecified: Secondary | ICD-10-CM | POA: Diagnosis present

## 2015-04-26 DIAGNOSIS — I2510 Atherosclerotic heart disease of native coronary artery without angina pectoris: Secondary | ICD-10-CM | POA: Diagnosis present

## 2015-04-26 DIAGNOSIS — W19XXXA Unspecified fall, initial encounter: Secondary | ICD-10-CM | POA: Diagnosis present

## 2015-04-26 DIAGNOSIS — R634 Abnormal weight loss: Secondary | ICD-10-CM | POA: Diagnosis not present

## 2015-04-26 DIAGNOSIS — G8929 Other chronic pain: Secondary | ICD-10-CM

## 2015-04-26 DIAGNOSIS — R748 Abnormal levels of other serum enzymes: Secondary | ICD-10-CM

## 2015-04-26 DIAGNOSIS — M25571 Pain in right ankle and joints of right foot: Secondary | ICD-10-CM | POA: Diagnosis present

## 2015-04-26 DIAGNOSIS — R7989 Other specified abnormal findings of blood chemistry: Secondary | ICD-10-CM

## 2015-04-26 DIAGNOSIS — K8689 Other specified diseases of pancreas: Secondary | ICD-10-CM

## 2015-04-26 DIAGNOSIS — R933 Abnormal findings on diagnostic imaging of other parts of digestive tract: Secondary | ICD-10-CM | POA: Insufficient documentation

## 2015-04-26 DIAGNOSIS — B182 Chronic viral hepatitis C: Secondary | ICD-10-CM | POA: Diagnosis not present

## 2015-04-26 DIAGNOSIS — R531 Weakness: Secondary | ICD-10-CM

## 2015-04-26 DIAGNOSIS — E871 Hypo-osmolality and hyponatremia: Secondary | ICD-10-CM | POA: Diagnosis present

## 2015-04-26 DIAGNOSIS — R945 Abnormal results of liver function studies: Secondary | ICD-10-CM

## 2015-04-26 DIAGNOSIS — K319 Disease of stomach and duodenum, unspecified: Secondary | ICD-10-CM | POA: Diagnosis present

## 2015-04-26 DIAGNOSIS — K219 Gastro-esophageal reflux disease without esophagitis: Secondary | ICD-10-CM | POA: Diagnosis present

## 2015-04-26 DIAGNOSIS — R1314 Dysphagia, pharyngoesophageal phase: Secondary | ICD-10-CM | POA: Diagnosis present

## 2015-04-26 DIAGNOSIS — M549 Dorsalgia, unspecified: Secondary | ICD-10-CM

## 2015-04-26 DIAGNOSIS — F1410 Cocaine abuse, uncomplicated: Secondary | ICD-10-CM | POA: Diagnosis present

## 2015-04-26 DIAGNOSIS — G629 Polyneuropathy, unspecified: Secondary | ICD-10-CM

## 2015-04-26 DIAGNOSIS — D696 Thrombocytopenia, unspecified: Secondary | ICD-10-CM | POA: Diagnosis present

## 2015-04-26 DIAGNOSIS — K859 Acute pancreatitis without necrosis or infection, unspecified: Secondary | ICD-10-CM

## 2015-04-26 LAB — CBC WITH DIFFERENTIAL/PLATELET
Basophils Absolute: 0 10*3/uL (ref 0.0–0.1)
Basophils Relative: 1 %
Eosinophils Absolute: 0 10*3/uL (ref 0.0–0.7)
Eosinophils Relative: 1 %
HCT: 36 % — ABNORMAL LOW (ref 39.0–52.0)
Hemoglobin: 13 g/dL (ref 13.0–17.0)
Lymphocytes Relative: 44 %
Lymphs Abs: 1.7 10*3/uL (ref 0.7–4.0)
MCH: 36.1 pg — ABNORMAL HIGH (ref 26.0–34.0)
MCHC: 36.1 g/dL — ABNORMAL HIGH (ref 30.0–36.0)
MCV: 100 fL (ref 78.0–100.0)
Monocytes Absolute: 0.4 10*3/uL (ref 0.1–1.0)
Monocytes Relative: 9 %
Neutro Abs: 1.8 10*3/uL (ref 1.7–7.7)
Neutrophils Relative %: 45 %
Platelets: 104 10*3/uL — ABNORMAL LOW (ref 150–400)
RBC: 3.6 MIL/uL — ABNORMAL LOW (ref 4.22–5.81)
RDW: 17.2 % — ABNORMAL HIGH (ref 11.5–15.5)
WBC: 3.9 10*3/uL — ABNORMAL LOW (ref 4.0–10.5)

## 2015-04-26 LAB — URINALYSIS, ROUTINE W REFLEX MICROSCOPIC
BILIRUBIN URINE: NEGATIVE
Glucose, UA: NEGATIVE mg/dL
Hgb urine dipstick: NEGATIVE
KETONES UR: NEGATIVE mg/dL
Leukocytes, UA: NEGATIVE
NITRITE: NEGATIVE
Protein, ur: NEGATIVE mg/dL
SPECIFIC GRAVITY, URINE: 1.012 (ref 1.005–1.030)
UROBILINOGEN UA: 1 mg/dL (ref 0.0–1.0)
pH: 5.5 (ref 5.0–8.0)

## 2015-04-26 LAB — COMPREHENSIVE METABOLIC PANEL
ALT: 43 U/L (ref 17–63)
AST: 104 U/L — ABNORMAL HIGH (ref 15–41)
Albumin: 2.8 g/dL — ABNORMAL LOW (ref 3.5–5.0)
Alkaline Phosphatase: 151 U/L — ABNORMAL HIGH (ref 38–126)
Anion gap: 9 (ref 5–15)
BUN: <5 mg/dL — ABNORMAL LOW (ref 6–20)
CO2: 24 mmol/L (ref 22–32)
Calcium: 8.7 mg/dL — ABNORMAL LOW (ref 8.9–10.3)
Chloride: 103 mmol/L (ref 101–111)
Creatinine, Ser: 0.92 mg/dL (ref 0.61–1.24)
GFR calc Af Amer: >60 mL/min (ref >60)
GFR calc non Af Amer: >60 mL/min (ref >60)
Glucose, Bld: 118 mg/dL — ABNORMAL HIGH (ref 65–99)
Potassium: 4 mmol/L (ref 3.5–5.1)
Sodium: 136 mmol/L (ref 135–145)
Total Bilirubin: 0.7 mg/dL (ref 0.3–1.2)
Total Protein: 8 g/dL (ref 6.5–8.1)

## 2015-04-26 LAB — LIPID PANEL
Cholesterol: 146 mg/dL (ref 0–200)
HDL: 29 mg/dL — AB (ref >40)
LDL Cholesterol: 98 mg/dL (ref 0–99)
Total CHOL/HDL Ratio: 5 RATIO
Triglycerides: 96 mg/dL (ref <150)
VLDL: 19 mg/dL (ref 0–40)

## 2015-04-26 LAB — RAPID URINE DRUG SCREEN, HOSP PERFORMED
AMPHETAMINES: NOT DETECTED
Barbiturates: NOT DETECTED
Benzodiazepines: NOT DETECTED
COCAINE: NOT DETECTED
OPIATES: POSITIVE — AB
TETRAHYDROCANNABINOL: NOT DETECTED

## 2015-04-26 LAB — RAPID HIV SCREEN (HIV 1/2 AB+AG)
HIV 1/2 Antibodies: NONREACTIVE
HIV-1 P24 Antigen - HIV24: NONREACTIVE

## 2015-04-26 LAB — VITAMIN B12: Vitamin B-12: 399 pg/mL (ref 180–914)

## 2015-04-26 LAB — SEDIMENTATION RATE: Sed Rate: 94 mm/hr — ABNORMAL HIGH (ref 0–16)

## 2015-04-26 LAB — HIV ANTIBODY (ROUTINE TESTING W REFLEX): HIV Screen 4th Generation wRfx: NONREACTIVE

## 2015-04-26 LAB — TROPONIN I
TROPONIN I: 0.07 ng/mL — AB (ref <0.031)
TROPONIN I: 0.06 ng/mL — AB (ref <0.031)
Troponin I: <0.03 ng/mL (ref <0.031)
Troponin I: 0.03 ng/mL (ref <0.031)

## 2015-04-26 LAB — ETHANOL: Alcohol, Ethyl (B): 237 mg/dL — ABNORMAL HIGH (ref <5)

## 2015-04-26 LAB — TSH: TSH: 1.498 u[IU]/mL (ref 0.350–4.500)

## 2015-04-26 LAB — RPR: RPR: NONREACTIVE

## 2015-04-26 LAB — CBG MONITORING, ED: GLUCOSE-CAPILLARY: 95 mg/dL (ref 65–99)

## 2015-04-26 LAB — URIC ACID: URIC ACID, SERUM: 7.5 mg/dL (ref 4.4–7.6)

## 2015-04-26 LAB — LIPASE, BLOOD: Lipase: 113 U/L — ABNORMAL HIGH (ref 22–51)

## 2015-04-26 MED ORDER — METOPROLOL TARTRATE 1 MG/ML IV SOLN
5.0000 mg | Freq: Once | INTRAVENOUS | Status: AC
  Administered 2015-04-26: 5 mg via INTRAVENOUS
  Filled 2015-04-26: qty 5

## 2015-04-26 MED ORDER — ONDANSETRON HCL 4 MG/2ML IJ SOLN
4.0000 mg | Freq: Once | INTRAMUSCULAR | Status: AC
  Administered 2015-04-26: 4 mg via INTRAVENOUS
  Filled 2015-04-26: qty 2

## 2015-04-26 MED ORDER — MORPHINE SULFATE (PF) 4 MG/ML IV SOLN
4.0000 mg | Freq: Once | INTRAVENOUS | Status: AC
  Administered 2015-04-26: 4 mg via INTRAVENOUS
  Filled 2015-04-26: qty 1

## 2015-04-26 MED ORDER — ADULT MULTIVITAMIN W/MINERALS CH
1.0000 | ORAL_TABLET | Freq: Every day | ORAL | Status: DC
  Administered 2015-04-27 – 2015-04-29 (×3): 1 via ORAL
  Filled 2015-04-26 (×3): qty 1

## 2015-04-26 MED ORDER — TRIAMCINOLONE ACETONIDE 0.1 % EX CREA
1.0000 "application " | TOPICAL_CREAM | Freq: Two times a day (BID) | CUTANEOUS | Status: DC | PRN
  Filled 2015-04-26: qty 15

## 2015-04-26 MED ORDER — SODIUM CHLORIDE 0.9 % IV BOLUS (SEPSIS)
1000.0000 mL | Freq: Once | INTRAVENOUS | Status: AC
  Administered 2015-04-26: 1000 mL via INTRAVENOUS

## 2015-04-26 MED ORDER — CARVEDILOL 12.5 MG PO TABS
12.5000 mg | ORAL_TABLET | Freq: Two times a day (BID) | ORAL | Status: DC
  Filled 2015-04-26 (×3): qty 1

## 2015-04-26 MED ORDER — LORAZEPAM 2 MG/ML IJ SOLN: 0.0000 mg | Freq: Two times a day (BID) | INTRAMUSCULAR | Status: DC

## 2015-04-26 MED ORDER — HEPARIN SODIUM (PORCINE) 5000 UNIT/ML IJ SOLN
5000.0000 [IU] | Freq: Three times a day (TID) | INTRAMUSCULAR | Status: DC
  Filled 2015-04-26: qty 1

## 2015-04-26 MED ORDER — GADOBENATE DIMEGLUMINE 529 MG/ML IV SOLN
15.0000 mL | Freq: Once | INTRAVENOUS | Status: AC | PRN
  Administered 2015-04-26: 15 mL via INTRAVENOUS

## 2015-04-26 MED ORDER — POLYETHYLENE GLYCOL 3350 17 G PO PACK
17.0000 g | PACK | Freq: Every day | ORAL | Status: DC | PRN
  Filled 2015-04-26: qty 1

## 2015-04-26 MED ORDER — METOPROLOL TARTRATE 50 MG PO TABS
50.0000 mg | ORAL_TABLET | Freq: Two times a day (BID) | ORAL | Status: DC
  Administered 2015-04-26 – 2015-04-30 (×9): 50 mg via ORAL
  Filled 2015-04-26 (×5): qty 1
  Filled 2015-04-26: qty 2
  Filled 2015-04-26 (×3): qty 1

## 2015-04-26 MED ORDER — ATORVASTATIN CALCIUM 40 MG PO TABS
40.0000 mg | ORAL_TABLET | Freq: Every day | ORAL | Status: DC
  Administered 2015-04-26 – 2015-04-30 (×5): 40 mg via ORAL
  Filled 2015-04-26 (×5): qty 1

## 2015-04-26 MED ORDER — FOLIC ACID 1 MG PO TABS
1.0000 mg | ORAL_TABLET | Freq: Once | ORAL | Status: AC
  Administered 2015-04-26: 1 mg via ORAL
  Filled 2015-04-26: qty 1

## 2015-04-26 MED ORDER — FOLIC ACID 1 MG PO TABS
1.0000 mg | ORAL_TABLET | Freq: Every day | ORAL | Status: DC
  Administered 2015-04-27 – 2015-04-30 (×4): 1 mg via ORAL
  Filled 2015-04-26 (×4): qty 1

## 2015-04-26 MED ORDER — SODIUM CHLORIDE 0.9 % IV SOLN
INTRAVENOUS | Status: DC
  Administered 2015-04-26 – 2015-04-27 (×3): via INTRAVENOUS

## 2015-04-26 MED ORDER — LISINOPRIL 20 MG PO TABS
20.0000 mg | ORAL_TABLET | Freq: Every day | ORAL | Status: DC
  Administered 2015-04-26 – 2015-04-30 (×5): 20 mg via ORAL
  Filled 2015-04-26 (×6): qty 1

## 2015-04-26 MED ORDER — SODIUM CHLORIDE 0.9 % IJ SOLN
3.0000 mL | Freq: Two times a day (BID) | INTRAMUSCULAR | Status: DC
  Administered 2015-04-26 – 2015-04-30 (×5): 3 mL via INTRAVENOUS

## 2015-04-26 MED ORDER — NITROGLYCERIN 0.4 MG SL SUBL: 0.4000 mg | SUBLINGUAL_TABLET | SUBLINGUAL | Status: DC | PRN

## 2015-04-26 MED ORDER — CHLORDIAZEPOXIDE HCL 5 MG PO CAPS
10.0000 mg | ORAL_CAPSULE | Freq: Three times a day (TID) | ORAL | Status: DC
  Administered 2015-04-26 – 2015-04-27 (×5): 10 mg via ORAL
  Filled 2015-04-26 (×2): qty 2
  Filled 2015-04-26: qty 1
  Filled 2015-04-26 (×3): qty 2

## 2015-04-26 MED ORDER — CLONIDINE HCL 0.2 MG PO TABS
0.2000 mg | ORAL_TABLET | Freq: Four times a day (QID) | ORAL | Status: DC | PRN
  Administered 2015-04-26: 0.2 mg via ORAL
  Filled 2015-04-26: qty 1

## 2015-04-26 MED ORDER — THIAMINE HCL 100 MG/ML IJ SOLN
100.0000 mg | Freq: Every day | INTRAMUSCULAR | Status: DC
  Administered 2015-04-26: 100 mg via INTRAVENOUS
  Filled 2015-04-26 (×2): qty 2

## 2015-04-26 MED ORDER — LORAZEPAM 2 MG/ML IJ SOLN: 0.0000 mg | Freq: Four times a day (QID) | INTRAMUSCULAR | Status: DC

## 2015-04-26 MED ORDER — VITAMIN B-1 100 MG PO TABS
100.0000 mg | ORAL_TABLET | Freq: Every day | ORAL | Status: DC
  Administered 2015-04-27 – 2015-04-30 (×4): 100 mg via ORAL
  Filled 2015-04-26 (×4): qty 1

## 2015-04-26 MED ORDER — ASPIRIN 81 MG PO CHEW
81.0000 mg | CHEWABLE_TABLET | Freq: Every day | ORAL | Status: DC
  Administered 2015-04-26 – 2015-04-30 (×5): 81 mg via ORAL
  Filled 2015-04-26 (×5): qty 1

## 2015-04-26 MED ORDER — LORAZEPAM 2 MG/ML IJ SOLN: 1.0000 mg | Freq: Four times a day (QID) | INTRAMUSCULAR | Status: AC | PRN

## 2015-04-26 MED ORDER — HYDROXYZINE HCL 50 MG/ML IM SOLN
25.0000 mg | Freq: Four times a day (QID) | INTRAMUSCULAR | Status: DC | PRN
  Filled 2015-04-26: qty 0.5

## 2015-04-26 MED ORDER — TRAMADOL HCL 50 MG PO TABS
50.0000 mg | ORAL_TABLET | Freq: Two times a day (BID) | ORAL | Status: DC | PRN
  Administered 2015-04-26 – 2015-04-29 (×3): 50 mg via ORAL
  Filled 2015-04-26 (×3): qty 1

## 2015-04-26 MED ORDER — GABAPENTIN 300 MG PO CAPS
300.0000 mg | ORAL_CAPSULE | Freq: Every day | ORAL | Status: DC
  Administered 2015-04-26 – 2015-04-29 (×4): 300 mg via ORAL
  Filled 2015-04-26 (×4): qty 1

## 2015-04-26 MED ORDER — PANTOPRAZOLE SODIUM 40 MG IV SOLR
40.0000 mg | INTRAVENOUS | Status: DC
  Administered 2015-04-26 – 2015-04-30 (×5): 40 mg via INTRAVENOUS
  Filled 2015-04-26 (×5): qty 40

## 2015-04-26 MED ORDER — ADULT MULTIVITAMIN W/MINERALS CH
1.0000 | ORAL_TABLET | Freq: Once | ORAL | Status: DC
  Filled 2015-04-26: qty 1

## 2015-04-26 MED ORDER — VITAMIN B-1 100 MG PO TABS
100.0000 mg | ORAL_TABLET | Freq: Once | ORAL | Status: AC
  Administered 2015-04-26: 100 mg via ORAL
  Filled 2015-04-26: qty 1

## 2015-04-26 MED ORDER — HYDRALAZINE HCL 20 MG/ML IJ SOLN
5.0000 mg | INTRAMUSCULAR | Status: DC | PRN
  Administered 2015-04-29: 5 mg via INTRAVENOUS
  Filled 2015-04-26: qty 1

## 2015-04-26 MED ORDER — NICOTINE 21 MG/24HR TD PT24
21.0000 mg | MEDICATED_PATCH | Freq: Every day | TRANSDERMAL | Status: DC
  Administered 2015-04-28 – 2015-04-30 (×3): 21 mg via TRANSDERMAL
  Filled 2015-04-26 (×5): qty 1

## 2015-04-26 MED ORDER — MORPHINE SULFATE (PF) 2 MG/ML IV SOLN
2.0000 mg | INTRAVENOUS | Status: DC | PRN
  Administered 2015-04-26 – 2015-04-30 (×11): 2 mg via INTRAVENOUS
  Filled 2015-04-26 (×11): qty 1

## 2015-04-26 MED ORDER — LORAZEPAM 1 MG PO TABS
1.0000 mg | ORAL_TABLET | Freq: Four times a day (QID) | ORAL | Status: AC | PRN
  Administered 2015-04-28: 1 mg via ORAL
  Filled 2015-04-26: qty 1

## 2015-04-26 NOTE — ED Notes (Signed)
Patient transported to Ultrasound 

## 2015-04-26 NOTE — Evaluation (Signed)
Physical Therapy Evaluation Patient Details Name: Patrick [name] MRN: [id number] DOB: [birth year] Today's Date: 04/26/2015   History of Present Illness  Patrick [name] is a 64 y.o. male with hx of MI, HTN, HLD, CAD, chronic chest pain, SSBE, thrombocytopenia, drug abuse, vasculitis, Raynaud's syndrome who presents with bilateral LE weakness, numbness from chest down and pain, recent weight loss with esophogeal stricture/dilitation and now with fall at home.  Clinical Impression  Patient able to participate in limited evaluation today in the ED.  Currently mobilizing with minguard assist so technically couldn't go home safely to take care of himself so recommending STSNF, however he may refuse so would need HHPT in the least.  PT to follow acutely for progression of mobility as tolerated.      Follow Up Recommendations SNF    Equipment Recommendations  Rolling walker with 5" wheels    Recommendations for Other Services       Precautions / Restrictions Precautions Precautions: Fall      Mobility  Bed Mobility Overal bed mobility: Needs Assistance Bed Mobility: Supine to Sit;Sit to Supine     Supine to sit: Min guard Sit to supine: Supervision   General bed mobility comments: up from stretcher in ED  Transfers Overall transfer level: Needs assistance Equipment used: Rolling walker (2 wheeled) Transfers: Sit to/from Stand Sit to Stand: Min guard         General transfer comment: for safety due to down from elevated height of stretcher  Ambulation/Gait Ambulation/Gait assistance: Min guard Ambulation Distance (Feet): 8 Feet (forwards and back x 2 in ED) Assistive device: Rolling walker (2 wheeled) Gait Pattern/deviations: Step-through pattern;Decreased stride length     General Gait Details: Difficult to walk far due to IV attached to bed and pt on monitors in ED, but able to walk forward and back with walker safely, but c/o pain.    Stairs            Wheelchair  Mobility    Modified Rankin (Stroke Patients Only)       Balance Overall balance assessment: Needs assistance         Standing balance support: Bilateral upper extremity supported Standing balance-Leahy Scale: Poor Standing balance comment: seems to need UE support for balance                              Pertinent Vitals/Pain Pain Assessment: 0-10 Pain Score: 10-Worst pain ever Pain Location: in feet with standing/walking (no grimacing noted) Pain Descriptors / Indicators: Aching;Burning Pain Intervention(s): Limited activity within patient's tolerance;Monitored during session;Repositioned    Home Living Family/patient expects to be discharged to:: Private residence Living Arrangements: Alone   Type of Home: Apartment Home Access: Stairs to enter Entrance Stairs-Rails: Right Entrance Stairs-Number of Steps: 6 Home Layout: One level Home Equipment: Cane - single point Additional Comments: lives in [place] housing    Prior Function Level of Independence: Independent with assistive device(s)         Comments: just recently started using cane     Hand Dominance        Extremity/Trunk Assessment               Lower Extremity Assessment: Generalized weakness;RLE deficits/detail;LLE deficits/detail (lifts antigravity, but painful to touch so MMT deferred, visible atrophy in all extremitites) RLE Deficits / Details: decreased sensation throughout UE/LE to chest with numbness and tingling LLE Deficits / Details: decreased sensation throughout UE/LE to chest  with numbness and tingling     Communication   Communication: No difficulties  Cognition Arousal/Alertness: Awake/alert Behavior During Therapy: WFL for tasks assessed/performed Overall Cognitive Status: Within Functional Limits for tasks assessed                      General Comments      Exercises        Assessment/Plan    PT Assessment Patient needs continued PT  services  PT Diagnosis Difficulty walking;Generalized weakness;Acute pain   PT Problem List Decreased strength;Decreased activity tolerance;Decreased balance;Decreased mobility;Impaired sensation;Decreased knowledge of use of DME;Decreased safety awareness  PT Treatment Interventions DME instruction;Balance training;Gait training;Stair training;Functional mobility training;Patient/family education;Therapeutic activities;Therapeutic exercise   PT Goals (Current goals can be found in the Care Plan section) Acute Rehab PT Goals Patient Stated Goal: To go home PT Goal Formulation: With patient Time For Goal Achievement: 05/10/15 Potential to Achieve Goals: Good    Frequency Min 3X/week   Barriers to discharge        Co-evaluation               End of Session Equipment Utilized During Treatment: Gait belt Activity Tolerance: Patient limited by pain Patient left: in bed;with call bell/phone within reach           Time: 4142-3953 PT Time Calculation (min) (ACUTE ONLY): 25 min   Charges:   PT Evaluation $Initial PT Evaluation Tier I: 1 Procedure PT Treatments $Therapeutic Activity: 8-22 mins   PT G CodesMagda Kiel May 22, 2015, 5:02 PM  Magda Kiel, [place] 05-22-2015

## 2015-04-26 NOTE — ED Notes (Signed)
Pt with active vomiting in ED approx 343ml clear fluids. VO received from Dr. Reynolds Bowl for zofran.

## 2015-04-26 NOTE — ED Provider Notes (Signed)
CSN: [id number]     Arrival date & time 04/26/15  0156 History  This chart was scribed for Virgel Manifold, MD by Hansel Feinstein, ED Scribe. This patient was seen in room A10C/A10C and the patient's care was started at 2:14 AM.     Chief Complaint  Patient presents with  . Numbness   The history is provided by the patient. No language interpreter was used.    HPI Comments: Patrick [name] is a 64 y.o. male with hx of MI, HTN, HLD, CAD, chronic chest pain, SSBE, thrombocytopenia, drug abuse, vasculitis, Raynaud's syndrome who presents to the Emergency Department complaining of moderate, generalized numbness from the chest down to his feet onset months ago and worsened today with associated 10/10 generalized pains, generalized weakness, bilateral ankle swelling, decreased appetite, recent weight loss (10-15 pounds over 3 months). He also notes 3 falls today secondary to weakness, no head injury, no LOC. Pt reports that he had difficulty getting up due to weakness. Pt notes that he has not fallen due to his weakness prior to today. He reports that his symptoms cause him difficulty with activities of daily living and ambulation. Pt reports that pain is symmetrical and is worsened with palpation. Pt notes that he has been evaluated by his PCP for his symptoms. He notes that he has an appointment with neurology next month. No Hx of DM. Pt has been drinking heavily and daily for a few years. Pt notes that he stopped drinking for a few months this year and began drinking again 4 months ago. He lives by himself. Pt notes that his stools were dark last year before he switched from drinking liquor to drinking wine. He denies current hematochezia or melena, back pain, emesis.   Past Medical History  Diagnosis Date  . MI (myocardial infarction)     x3, jan, aug, nov 2011; treated at [facility] in [place]  . Sexual dysfunction   . HTN (hypertension)   . Hyperlipidemia   . Chronic chest pain   . CAD (coronary artery  disease)   . Thrombocytopenia   . SSBE (short-segment Barrett's esophagus) 11/11/11  . Colon adenomas 11/11/11    x 5, one rectal tv adenoma  . GERD (gastroesophageal reflux disease)   . 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 Barrett's esophagus 11/17/2011    Short-segment dx 11/2011   . Arthritis     knees  . Cataract     bil removed  . Anemia   . Alcoholism 01/02/2012    cocaine abuse, herion abuse in past   Past Surgical History  Procedure Laterality Date  . Hernia repair      very young  . Balloon angioplasty, artery  03/2010  . Colonoscopy  11/11/11    Dr. Silvano Rusk  . Esophagogastroduodenoscopy  11/11/11    Dr. Silvano Rusk  . Cardiac catheterization  01/31/2011  . Coronary angioplasty    . Cataract extraction w/phaco  08/18/2012    Procedure: CATARACT EXTRACTION PHACO AND INTRAOCULAR LENS PLACEMENT (IOC);  Surgeon: Adonis Brook, MD;  Location: [facility];  Service: Ophthalmology;  Laterality: Right;  . Cataract extraction w/phaco Left 09/22/2012    Procedure: CATARACT EXTRACTION PHACO AND INTRAOCULAR LENS PLACEMENT (IOC);  Surgeon: Adonis Brook, MD;  Location: [facility];  Service: Ophthalmology;  Laterality: Left;  . Upper gastrointestinal endoscopy     Family History  Problem Relation Age of Onset  . Cancer      mother's side/fathers side, 2 uncles  .  Heart attack Maternal Grandfather   . Diabetes Sister   . Stroke Sister   . Colon cancer Neg Hx   . Esophageal cancer Neg Hx   . Rectal cancer Neg Hx   . Stomach cancer Neg Hx    Social History  Substance Use Topics  . Smoking status: Current Some Day Smoker -- 0.10 packs/day for 45 years    Types: Cigarettes  . Smokeless tobacco: Never Used     Comment: 1-2 cig/day  . Alcohol Use: Yes     Comment: Pt reports drinking 2 bottles of wine tonight.     Review of Systems  Constitutional: Positive for appetite change and unexpected weight change.  Cardiovascular: Positive for leg swelling.  Gastrointestinal: Negative  for vomiting and blood in stool.  Musculoskeletal: Positive for arthralgias ( generalized). Negative for back pain.  Neurological: Positive for weakness ( generalized) and numbness ( generalized).  All other systems reviewed and are negative.  Allergies  Review of patient's allergies indicates no known allergies.  Home Medications   Prior to Admission medications   Medication Sig Start Date End Date Taking? Authorizing Provider  aspirin 81 MG tablet Take 81 mg by mouth daily.     Historical Provider, MD  atorvastatin (LIPITOR) 40 MG tablet Take 1 tablet (40 mg total) by mouth daily at 6 PM. Patient taking differently: Take 40 mg by mouth daily.  11/30/12   Timmothy Euler, MD  carvedilol (COREG) 12.5 MG tablet Take 12.5 mg by mouth 2 (two) times daily with a meal. 09/05/12   Azzie Roup Edmisten, PA-C  furosemide (LASIX) 20 MG tablet Take 1 tablet as needed for fluid retention 04/02/15   Brittainy M Simmons, PA-C  lisinopril (PRINIVIL,ZESTRIL) 20 MG tablet Take 20 mg by mouth daily.     Historical Provider, MD  nitroGLYCERIN (NITROSTAT) 0.4 MG SL tablet Place 1 tablet (0.4 mg total) under the tongue every 5 (five) minutes as needed for chest pain. 03/03/15   Brittainy Erie Noe, PA-C  omeprazole (PRILOSEC) 20 MG capsule Take 1 capsule (20 mg total) by mouth 2 (two) times daily. 03/08/15   Ladene Artist, MD  polyethylene glycol [facility] / Floria Raveling) packet Take 17 g by mouth once. 03/23/15   Historical Provider, MD   BP 119/100 mmHg  Resp 14  SpO2 98% Physical Exam  Constitutional: He is oriented to person, place, and time. He appears well-developed and well-nourished. No distress.  Pt is frail appearing, but not distressed.  HENT:  Head: Normocephalic and atraumatic.  Mouth/Throat: Oropharynx is clear and moist. No oropharyngeal exudate.  Eyes: Conjunctivae and EOM are normal. Pupils are equal, round, and reactive to light. Right eye exhibits no discharge. Left eye exhibits no discharge. No  scleral icterus.  Neck: Normal range of motion. Neck supple. No JVD present. No thyromegaly present.  Cardiovascular: Regular rhythm, normal heart sounds and intact distal pulses.  Tachycardia present.  Exam reveals no gallop and no friction rub.   No murmur heard. Pulmonary/Chest: Effort normal and breath sounds normal. No respiratory distress. He has no wheezes. He has no rales.  Abdominal: Soft. Bowel sounds are normal. He exhibits no distension and no mass. There is tenderness. There is no rebound and no guarding.  Diffuse abdominal tenderness, worse in epigastrium, no rebound, no guarding, no distention.   Musculoskeletal: Normal range of motion. He exhibits no edema or tenderness.  Small abrasion r elbow. No significant bony tenderness or pain with ROM. No midline spinal tenderness.  Lymphadenopathy:    He has no cervical adenopathy.  Neurological: He is alert and oriented to person, place, and time. No cranial nerve deficit. Coordination normal.  Cranial nerves 2-12 intact. Strength 4/5 BUE, 3/5 BLE.   Skin: Skin is warm and dry. No rash noted. No erythema.  Psychiatric: He has a normal mood and affect. His behavior is normal.  Nursing note and vitals reviewed.  ED Course  Procedures (including critical care time) DIAGNOSTIC STUDIES: Oxygen Saturation is 97% on RA, normal by my interpretation.    COORDINATION OF CARE: 2:31 AM Discussed treatment plan with pt at bedside and pt agreed to plan.   Labs Review Labs Reviewed  ETHANOL - Abnormal; Notable for the following:    Alcohol, Ethyl (B) 237 (*)    All other components within normal limits  COMPREHENSIVE METABOLIC PANEL - Abnormal; Notable for the following:    Glucose, Bld 118 (*)    BUN <5 (*)    Calcium 8.7 (*)    Albumin 2.8 (*)    AST 104 (*)    Alkaline Phosphatase 151 (*)    All other components within normal limits  LIPASE, BLOOD - Abnormal; Notable for the following:    Lipase 113 (*)    All other components  within normal limits  CBC WITH DIFFERENTIAL/PLATELET - Abnormal; Notable for the following:    WBC 3.9 (*)    RBC 3.60 (*)    HCT 36.0 (*)    MCH 36.1 (*)    MCHC 36.1 (*)    RDW 17.2 (*)    Platelets 104 (*)    All other components within normal limits  TROPONIN I  URINALYSIS, ROUTINE W REFLEX MICROSCOPIC (NOT AT [facility])  RAPID HIV SCREEN (HIV 1/2 AB+AG)    Imaging Review Ct Head Wo Contrast  04/26/2015   CLINICAL DATA:  Initial evaluation for left lower extremity numbness.  EXAM: CT HEAD WITHOUT CONTRAST  TECHNIQUE: Contiguous axial images were obtained from the base of the skull through the vertex without intravenous contrast.  COMPARISON:  Prior study from 11/16/2010.  FINDINGS: There is no acute intracranial hemorrhage or infarct. No mass lesion or midline shift. Gray-white matter differentiation is well maintained. Ventricles are normal in size without evidence of hydrocephalus. CSF containing spaces are within normal limits. No extra-axial fluid collection. Generalized cerebral atrophy with mild chronic microvascular ischemic disease present. Scattered vascular calcifications within the carotid siphons.  The calvarium is intact.  Orbital soft tissues are within normal limits.  The paranasal sinuses and mastoid air cells are well pneumatized and free of fluid.  Scalp soft tissues are unremarkable.  IMPRESSION: 1. No acute intracranial process. 2. Generalized age-related cerebral atrophy with chronic microvascular ischemic disease.   Electronically Signed   By: Jeannine Boga M.D.   On: 04/26/2015 04:19   I have personally reviewed and evaluated these images and lab results as part of my medical decision-making.   EKG Interpretation   Date/Time:  Thursday April 26 2015 05:22:46 EDT Ventricular Rate:  114 PR Interval:  143 QRS Duration: 79 QT Interval:  360 QTC Calculation: 496 R Axis:   -10 Text Interpretation:  Sinus tachycardia Abnormal R-wave progression, early   transition Borderline prolonged QT interval ED PHYSICIAN INTERPRETATION  AVAILABLE IN [facility] Confirmed by TEST, Record ([id number]) on  04/27/2015 7:02:52 AM      MDM   Final diagnoses:  Neuropathy  Alcohol abuse  Fall, initial encounter   64yM with worsening numbness, weakness,  neuropathic type pain. Symmetric LE and UE weakness, LE>UE. Has been ongoing for at least months. 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 Intermittent periods of sobriety, but otherwise drinking heavily for years. Nutritional deficiencies common in ETOH abuse and may be contriibuting. Will check rapid HIV. Doesn't appear to have hx of DM.  CT head w/o acute abnormality. Several falls prior to arrival. Took him several hours just to get himself back up and dressed. I suspect there is more than him simply being intoxicated.  Resting tachycardia and appears tired, but not toxic. Will tx with IVF.  Will CT head although I doubt acute neurological event. No specific complaint of abdominal pain, but has upper abdominal tenderness on exam. Gastritis? Pancreatitis? Will check lipase. PRN pain meds. Imaging of abdomen deferred at this time.   I personally preformed the services scribed in my presence. The recorded information has been reviewed is accurate. Virgel Manifold, MD.   Virgel Manifold, MD 05/04/15 [phone]

## 2015-04-26 NOTE — ED Notes (Signed)
Pt noted to be 87-88% on RA. Pt resting at this time. Pt placed on nasal cannula at 2L- oxygen sats improved to 92%

## 2015-04-26 NOTE — H&P (Addendum)
[facility] History and Physical  Maron Stanzione RFF:[id number] DOB: 1951/05/20 DOA: 04/26/2015  Referring physician: ED physician PCP: Pcp Not In System  Specialists:   Chief Complaint: Numbness, abdominal pain, fall, ankle swelling, weight loss, pain all over  HPI: Dilraj Killgore is a 64 y.o. male with PMH of MI, HTN, HLD, CAD, chronic chest pain, GERD, ankle abuse, tobacco abuse, Bernadette the esophagus, HCV, cirrhosis, anemia, vasculitis, Raynaud's syndrome,  thrombocytopenia, who presents with numbness, abdominal pain, fall, ankle swelling, weight loss, pain all over.  Patient reports that he has been having generalized numbness from chest down to his feet for several months. It has been progressively getting worse today. It is associated with generalized pain and generalized weakness. He reports that his symptoms cause him difficulty with activities of daily living and ambulation. He notes that he has an appointment with neurology on 05/16/15. He said he has pain all over except for back, particuarlly in his epigastric area. He reports that he has a decreased appetite, and has weight loss 10-15 pounds over 3 months. He also notes 3 falls today secondary to weakness, no head injury, no LOC. He has chest pain, but no cough or shortness of breath. He also reports bilateral ankle swelling. Of note, pt has been drinking heavily and daily for a few years. Pt notes that he stopped drinking for a few months this year and began drinking again 4 months ago. He denies current hematochezia, melena, back pain, emesis, symptoms of UTI, rashes, unilateral weakness. He had vitamin B12 level of 515 on 03/30/15.  In ED, patient was found to have lipase 113, alcohol level 237, negative troponin, WBC 3.9, platelets 104, tachycardia, electrolytes okay, negative CT head of acute abnormalities.  Where does patient live?   At home  Can patient participate in ADLs? Some   Review of Systems:   General: no  fevers, chills, has weight loss, has poor appetite, has fatigue HEENT: no blurry vision, hearing changes or sore throat Pulm: no dyspnea, coughing, wheezing CV: has chest pain, palpitations Abd: no nausea, vomiting, has abdominal pain, no diarrhea, constipation GU: no dysuria, burning on urination, increased urinary frequency, hematuria  Ext: has ankle edema Neuro: no unilateral weakness, numbness, or tingling, no vision change or hearing loss. Has numbness. Skin: no rash MSK: No muscle spasm, no deformity, no limitation of range of movement in spin Heme: No easy bruising.  Travel history: No recent long distant travel.  Allergy: No Known Allergies  Past Medical History  Diagnosis Date  . MI (myocardial infarction)     x3, jan, aug, nov 2011; treated at [facility] in [place]  . Sexual dysfunction   . HTN (hypertension)   . Hyperlipidemia   . Chronic chest pain   . CAD (coronary artery disease)   . Thrombocytopenia   . SSBE (short-segment Barrett's esophagus) 11/11/11  . Colon adenomas 11/11/11    x 5, one rectal tv adenoma  . GERD (gastroesophageal reflux disease)   . 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 Barrett's esophagus 11/17/2011    Short-segment dx 11/2011   . Arthritis     knees  . Cataract     bil removed  . Anemia   . Alcoholism 01/02/2012    cocaine abuse, herion abuse in past    Past Surgical History  Procedure Laterality Date  . Hernia repair      very young  . Balloon angioplasty, artery  03/2010  . Colonoscopy  11/11/11  Dr. Silvano Rusk  . Esophagogastroduodenoscopy  11/11/11    Dr. Silvano Rusk  . Cardiac catheterization  01/31/2011  . Coronary angioplasty    . Cataract extraction w/phaco  08/18/2012    Procedure: CATARACT EXTRACTION PHACO AND INTRAOCULAR LENS PLACEMENT (IOC);  Surgeon: Adonis Brook, MD;  Location: [facility];  Service: Ophthalmology;  Laterality: Right;  . Cataract extraction w/phaco Left 09/22/2012    Procedure: CATARACT EXTRACTION PHACO  AND INTRAOCULAR LENS PLACEMENT (IOC);  Surgeon: Adonis Brook, MD;  Location: [place];  Service: Ophthalmology;  Laterality: Left;  . Upper gastrointestinal endoscopy      Social History:  reports that he has been smoking Cigarettes.  He has a 4.5 pack-year smoking history. He has never used smokeless tobacco. He reports that he drinks alcohol. He reports that he does not use illicit drugs.  Family History:  Family History  Problem Relation Age of Onset  . Cancer      mother's side/fathers side, 2 uncles  . Heart attack Maternal Grandfather   . Diabetes Sister   . Stroke Sister   . Colon cancer Neg Hx   . Esophageal cancer Neg Hx   . Rectal cancer Neg Hx   . Stomach cancer Neg Hx      Prior to Admission medications   Medication Sig Start Date End Date Taking? Authorizing Provider  aspirin 81 MG tablet Take 81 mg by mouth daily.    Yes Historical Provider, MD  atorvastatin (LIPITOR) 40 MG tablet Take 1 tablet (40 mg total) by mouth daily at 6 PM. Patient taking differently: Take 40 mg by mouth daily.  11/30/12  Yes Timmothy Euler, MD  carvedilol (COREG) 12.5 MG tablet Take 12.5 mg by mouth 2 (two) times daily with a meal. 09/05/12  Yes Brooke O Edmisten, PA-C  gabapentin (NEURONTIN) 300 MG capsule Take 300 mg by mouth at bedtime.  04/05/15  Yes Historical Provider, MD  lisinopril (PRINIVIL,ZESTRIL) 20 MG tablet Take 20 mg by mouth daily.    Yes Historical Provider, MD  omeprazole (PRILOSEC) 20 MG capsule Take 1 capsule (20 mg total) by mouth 2 (two) times daily. 03/08/15  Yes Ladene Artist, MD  traMADol (ULTRAM) 50 MG tablet Take 50 mg by mouth every 12 (twelve) hours as needed for moderate pain.  03/22/15  Yes Historical Provider, MD  triamcinolone cream (KENALOG) 0.1 % Apply 1 application topically 2 (two) times daily as needed (apply to rash).  04/14/15  Yes Historical Provider, MD  furosemide (LASIX) 20 MG tablet Take 1 tablet as needed for fluid retention 04/02/15   Brittainy Erie Noe,  PA-C  Hydrocodone-Acetaminophen 5-300 MG TABS Take 1 tablet by mouth 2 (two) times daily as needed. 04/05/15   Historical Provider, MD  nitroGLYCERIN (NITROSTAT) 0.4 MG SL tablet Place 1 tablet (0.4 mg total) under the tongue every 5 (five) minutes as needed for chest pain. 03/03/15   Brittainy Erie Noe, PA-C  polyethylene glycol (MIRALAX / GLYCOLAX) packet Take 17 g by mouth daily as needed.  03/23/15   Historical Provider, MD    Physical Exam: Filed Vitals:   04/26/15 0345 04/26/15 0500 04/26/15 0515 04/26/15 0700  BP: 157/106 158/101 149/104 158/110  Pulse: 109 110 118 124  Resp: $Remo'13 15 16 20  'NiboX$ SpO2: 94% 94% 98% 94%   General: Not in acute distress.  HEENT:       Eyes: PERRL, EOMI, no scleral icterus.       ENT: No discharge from the ears  and nose, no pharynx injection, no tonsillar enlargement.        Neck: No JVD, no bruit, no mass felt. Heme: No neck lymph node enlargement. Cardiac: S1/S2, RRR, tachycardia, No murmurs, No gallops or rubs. Pulm: No rales, wheezing, rhonchi or rubs. Abd: Soft, nondistended, tenderness over epigastric area, no rebound pain, no organomegaly, BS present. Ext: has trace ankle edema bilaterally. 2+DP/PT pulse bilaterally. Musculoskeletal: No joint deformities, No joint redness or warmth, no limitation of ROM in spin. Skin: No rashes.  Neuro: Alert, oriented X3, cranial nerves II-XII grossly intact, muscle strength 5/5 in all extremities, sensation to light touch intact. Brachial reflex 1+ bilaterally. Knee reflex 1+ bilaterally. Negative Babinski's sign. Normal finger to nose test. Psych: Patient is not psychotic, no suicidal or hemocidal ideation.  Labs on Admission:  Basic Metabolic Panel:  Recent Labs Lab 04/26/15 0321  NA 136  K 4.0  CL 103  CO2 24  GLUCOSE 118*  BUN <5*  CREATININE 0.92  CALCIUM 8.7*   Liver Function Tests:  Recent Labs Lab 04/26/15 0321  AST 104*  ALT 43  ALKPHOS 151*  BILITOT 0.7  PROT 8.0  ALBUMIN 2.8*     Recent Labs Lab 04/26/15 0321  LIPASE 113*   No results for input(s): AMMONIA in the last 168 hours. CBC:  Recent Labs Lab 04/26/15 0321  WBC 3.9*  NEUTROABS 1.8  HGB 13.0  HCT 36.0*  MCV 100.0  PLT 104*   Cardiac Enzymes:  Recent Labs Lab 04/26/15 0321  TROPONINI <0.03    BNP (last 3 results) No results for input(s): BNP in the last 8760 hours.  ProBNP (last 3 results) No results for input(s): PROBNP in the last 8760 hours.  CBG: No results for input(s): GLUCAP in the last 168 hours.  Radiological Exams on Admission: Ct Head Wo Contrast  04/26/2015   CLINICAL DATA:  Initial evaluation for left lower extremity numbness.  EXAM: CT HEAD WITHOUT CONTRAST  TECHNIQUE: Contiguous axial images were obtained from the base of the skull through the vertex without intravenous contrast.  COMPARISON:  Prior study from 11/16/2010.  FINDINGS: There is no acute intracranial hemorrhage or infarct. No mass lesion or midline shift. Gray-white matter differentiation is well maintained. Ventricles are normal in size without evidence of hydrocephalus. CSF containing spaces are within normal limits. No extra-axial fluid collection. Generalized cerebral atrophy with mild chronic microvascular ischemic disease present. Scattered vascular calcifications within the carotid siphons.  The calvarium is intact.  Orbital soft tissues are within normal limits.  The paranasal sinuses and mastoid air cells are well pneumatized and free of fluid.  Scalp soft tissues are unremarkable.  IMPRESSION: 1. No acute intracranial process. 2. Generalized age-related cerebral atrophy with chronic microvascular ischemic disease.   Electronically Signed   By: Jeannine Boga M.D.   On: 04/26/2015 04:19    EKG: Independently reviewed.  Abnormal findings: QTC 496, early R-wave progression, tachycardia  Assessment/Plan Principal Problem:   Numbness Active Problems:   CAD (coronary artery disease)    Hypertension   Hyperlipidemia   Tobacco abuse   Cocaine abuse   Barrett's esophagus   Leukopenia and Thrombocytopenia   Chronic hepatitis C with cirrhosis   Alcoholism   Polysubstance abuse   Essential hypertension   Fall   Protein-calorie malnutrition, moderate   Weakness   Abdominal pain   Pancreatitis  Numbness: He reports that he has numbness of from his chest to his feet. Etiology is not clear. 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 He had vitamin B12 level 515 on 03/30/15. He has an appointment to see neurology on 05/16/15.  -will admit to tele bed -Check RPR, HIV ab, uds, Vb12, tsh and ESR -continue Neurontin -Follow-up with neurology  CAD (coronary artery disease): Patient has very atypical chest pain. No ischemic changes on EKG. -Continue aspirin, Coreg, Lipitor -prn morphine and NTG -trop x 3  Tachycardia: Likely due to combination of pain and alcohol drinking. Patient does not have shortness of breath AND oxygen desaturation, less likely to have pulmonary embolism. -IVF: NS 1L and then 100 cc/h -Observe respiratory function closely. If he develops oxygen desaturation or worsening chest pain, may consider CTA to rule out PE.  Pancreatitis and AP: His abdominal pain is most likely caused by pancreatitis given elevated lipase. Pancreatitis is likely secondary to alcohol abuse. -NPO -IVF -US-abd -check lipid profile to r/o hypertriglyceridemia -IV Protonix for possible alcoholic gastritis -When necessary hydroxyzine for nausea  HLD: Last LDL was 94. -Continue home medications: Lipitor  HTN: -Coreg, lisinopril -IV hydralazine when necessary  Tobacco abuse and Alcohol abuse: -Did counseling about importance of quitting smoking -Nicotine patch -Did counseling about the importance of quitting drinking -CIWA protocol  Leukopenia and Thrombocytopenia: WBC 3.9, platelets 104. No bleeding tendency. Most likely due to alcohol abuse -Follow up by CBC  Fall: Most  likely due to alcohol abuse. His alcohol level is 237 on admission. CT head is negative for acute abnormalities. -Fall precaution -PT/OT  Protein-calorie malnutrition, moderate: -Ensure when patient is able to eat (not ordered yet)  Generalized weakness: Likely due to multifactorial etiology and deconditioning. -PT/OT -Treat lying problems as above  Ankle edema: Patient has trace amount of ankle swelling to my examination. Etiology is not clear. -Check BNP to rule out congestive heart failure -hold lasix since pt is clinically dry need IV fluid.  DVT ppx: SQ Heparin  Code Status: Full code Family Communication: None at bed side.  Disposition Plan: Admit to inpatient   Date of Service 04/26/2015    Ivor Costa [facility] Pager [phone]  If 7PM-7AM, please contact night-coverage [email or web address] Password [facility] 04/26/2015, 7:16 AM

## 2015-04-26 NOTE — ED Notes (Signed)
Per EMS, pt is having numbness, tingling and pain from his chest down. Pt with hx of Raynaud's Syndrome. Pt admits to drinking 2 bottles of wine tonight, and states that this is normal for him. Pt also states that he has fallen multiple times today. Pt states that he has never fallen when feeling this way, and states that it has become hard to get around his apartment.

## 2015-04-26 NOTE — ED Notes (Signed)
CBG = 95  Nikki RN informed of result.

## 2015-04-26 NOTE — Progress Notes (Signed)
Patient Demographics:    Patrick [name], is a 64 y.o. male, DOB - 19-Feb-1951, YKZ:[id number]  Admit date - 04/26/2015   Admitting Physician Ivor Costa, MD  Outpatient Primary MD for the patient is Pcp Not In System  LOS - 0   Chief Complaint  Patient presents with  . Numbness        Subjective:    [name] today has, No headache, No chest pain, No abdominal pain - No Nausea, No new weakness tingling or numbness, No Cough - SOB. He complains of generalized weakness and numbness below his chest.   Assessment  & Plan :     1. Chronic numbness starting from chest all the way down to both feet ongoing for several months. Patient claims that he has a pending appointment with [facility] neurology on 05/16/2015. He denies any back pain, does have history of red knots phenomenon and vasculitis. Does not have any objective weakness. No signs of cauda equina. B-12 is borderline, TSH stable, RPR pending. HIV pending. Uric acid level pending. ESR is elevated. We will check T-spine MRI to rule out an infarct due to his underlying history of vasculitis, also rule out any acute infection which is less likely. Obtain blood cultures. We will have PT evaluate as well. Currently on aspirin and statin which will be continued.   2. Pain in both feet. He is hyperesthetic all over. Will check uric acid level, x-ray right ankle which is more tender, could have disseminated gout. Monitor.   3. History of alcoholic cirrhosis. Consumes large amounts of alcohol on a daily basis. Counseled to quit. Is probably going and early DTs. Placed on scheduled Librium, thiamine and folic acid along with CIWA protocol. He follows with [facility], to follow with them post discharge as well.   4. Essential hypertension. Her pressure  medications adjusted, confirmed with patient not been using cocaine for the last 3 months.    5. Dyslipidemia on statin continue.   6. Possible mild pancreatitis. Likely alcohol induced. Bowel rest, IV fluids, pain control, lipid panel shows triglycerides of 96, ultrasound abdomen shows CBD at 4 mm. AST elevated in the pattern of alcohol abuse. Supportive care and monitor.     Code Status : Full  Family Communication  : None present  Disposition Plan  : Remain inpatient next 1-2 days  Consults  :  None  Procedures  :   CT head unremarkable  MRI T-spine ordered  DVT Prophylaxis  :    Heparin   Lab Results  Component Value Date   PLT 104* 04/26/2015    Inpatient Medications  Scheduled Meds: . aspirin  81 mg Oral Daily  . atorvastatin  40 mg Oral Daily  . chlordiazePOXIDE  10 mg Oral TID  . folic acid  1 mg Oral Daily  . gabapentin  300 mg Oral QHS  . heparin  5,000 Units Subcutaneous 3 times per day  . lisinopril  20 mg Oral Daily  . LORazepam  0-4 mg Intravenous Q6H   Followed by  . [START ON 04/28/2015] LORazepam  0-4 mg Intravenous Q12H  . metoprolol tartrate  50 mg Oral BID  . multivitamin with minerals  1 tablet Oral Once  . multivitamin with  minerals  1 tablet Oral Daily  . nicotine  21 mg Transdermal Daily  . pantoprazole (PROTONIX) IV  40 mg Intravenous Q24H  . sodium chloride  3 mL Intravenous Q12H  . thiamine  100 mg Oral Daily   Or  . thiamine  100 mg Intravenous Daily   Continuous Infusions: . sodium chloride 100 mL/hr at 04/26/15 0948   PRN Meds:.cloNIDine, hydrALAZINE, hydrOXYzine, LORazepam **OR** LORazepam, morphine injection, nitroGLYCERIN, polyethylene glycol, traMADol, triamcinolone cream  Antibiotics  :     Anti-infectives    None        Objective:   Filed Vitals:   04/26/15 0915 04/26/15 1000 04/26/15 1030 04/26/15 1100  BP: 172/115 181/110 162/101 159/96  Pulse: 115 96  95  Resp: _0 SpO2: 99% 96%  90%    Wt  Readings from Last 3 Encounters:  04/02/15 72.576 kg (160 lb)  03/20/15 73.437 kg (161 lb 14.4 oz)  03/08/15 71.668 kg (158 lb)     Intake/Output Summary (Last 24 hours) at 04/26/15 1144 Last data filed at 04/26/15 0742  Gross per 24 hour  Intake      3 ml  Output      0 ml  Net      3 ml     Physical Exam  Awake Alert, Oriented X 3, No new F.N deficits, Normal affect [place].AT,PERRAL Supple Neck,No JVD, No cervical lymphadenopathy appriciated.  Symmetrical Chest wall movement, Good air movement bilaterally, CTAB RRR,No Gallops,Rubs or new Murmurs, No Parasternal Heave +ve B.Sounds, Abd Soft, No tenderness, No organomegaly appriciated, No rebound - guarding or rigidity. No Cyanosis, Clubbing or edema, No new Rash or bruise       Data Review:   Micro Results No results found for this or any previous visit (from the past 240 hour(s)).  Radiology Reports Ct Head Wo Contrast  04/26/2015   CLINICAL DATA:  Initial evaluation for left lower extremity numbness.  EXAM: CT HEAD WITHOUT CONTRAST  TECHNIQUE: Contiguous axial images were obtained from the base of the skull through the vertex without intravenous contrast.  COMPARISON:  Prior study from 11/16/2010.  FINDINGS: There is no acute intracranial hemorrhage or infarct. No mass lesion or midline shift. Gray-white matter differentiation is well maintained. Ventricles are normal in size without evidence of hydrocephalus. CSF containing spaces are within normal limits. No extra-axial fluid collection. Generalized cerebral atrophy with mild chronic microvascular ischemic disease present. Scattered vascular calcifications within the carotid siphons.  The calvarium is intact.  Orbital soft tissues are within normal limits.  The paranasal sinuses and mastoid air cells are well pneumatized and free of fluid.  Scalp soft tissues are unremarkable.  IMPRESSION: 1. No acute intracranial process. 2. Generalized age-related cerebral atrophy with chronic  microvascular ischemic disease.   Electronically Signed   By: Jeannine Boga M.D.   On: 04/26/2015 04:19   US Abdomen Complete  04/26/2015   CLINICAL DATA:  Abdominal pain for several months.  Pancreatitis.  EXAM: ULTRASOUND ABDOMEN COMPLETE  COMPARISON:  None.  FINDINGS: Gallbladder: No gallstones or wall thickening visualized. Sonographic Murphy sign indeterminate due to pain medication.  Common bile duct: Diameter: 4 mm  Liver: No focal lesion identified. Coarse echogenic liver with poor sonic penetration compatible with diffuse hepatic steatosis.  IVC: No abnormality visualized.  Pancreas: Borderline dilatation of the dorsal pancreatic duct at 3 mm. Pancreatic head poorly seen.  Spleen: Size and appearance within normal limits.  Right Kidney: Length: 6.0 cm.  Severely atrophic/dysplastic right kidney, with several associated calcifications.  Left Kidney: Length: 13.3 cm. Echogenicity within normal limits. No mass or hydronephrosis visualized.  Abdominal aorta: No aneurysm visualized. Marginal atherosclerotic calcification.  Other findings: None.  IMPRESSION: 1. Borderline dilatation of the dorsal pancreatic duct. The pancreatic head is poorly seen. Pancreatic body and tail appear otherwise unremarkable. No pseudocyst identified. 2. Severely atrophic/dysplastic right kidney. Compensatory hypertrophy of the left kidney. 3. Coarse echogenic liver with poor sonic penetration compatible with diffuse hepatic steatosis.   Electronically Signed   By: Van Clines M.D.   On: 04/26/2015 08:43     CBC  Recent Labs Lab 04/26/15 0321  WBC 3.9*  HGB 13.0  HCT 36.0*  PLT 104*  MCV 100.0  MCH 36.1*  MCHC 36.1*  RDW 17.2*  LYMPHSABS 1.7  MONOABS 0.4  EOSABS 0.0  BASOSABS 0.0    Chemistries   Recent Labs Lab 04/26/15 0321  NA 136  K 4.0  CL 103  CO2 24  GLUCOSE 118*  BUN <5*  CREATININE 0.92  CALCIUM 8.7*  AST 104*  ALT 43  ALKPHOS 151*  BILITOT 0.7    ------------------------------------------------------------------------------------------------------------------ CrCl cannot be calculated (Unknown ideal weight.). ------------------------------------------------------------------------------------------------------------------ No results for input(s): HGBA1C in the last 72 hours. ------------------------------------------------------------------------------------------------------------------  Recent Labs  04/26/15 0714  CHOL 146  HDL 29*  LDLCALC 98  TRIG 96  CHOLHDL 5.0   ------------------------------------------------------------------------------------------------------------------  Recent Labs  04/26/15 0714  TSH 1.498   ------------------------------------------------------------------------------------------------------------------  Recent Labs  04/26/15 0714  VITAMINB12 399    Coagulation profile No results for input(s): INR, PROTIME in the last 168 hours.  No results for input(s): DDIMER in the last 72 hours.  Cardiac Enzymes  Recent Labs Lab 04/26/15 0321 04/26/15 0714  TROPONINI <0.03 0.03   ------------------------------------------------------------------------------------------------------------------ Invalid input(s): POCBNP   Time Spent in minutes   35   Tylesha Gibeault K M.D on 04/26/2015 at 11:44 AM  Between 7am to 7pm - Pager - [phone]  After 7pm go to [email or web address] - password [facility]  [facility] -  Office  [phone]

## 2015-04-27 ENCOUNTER — Inpatient Hospital Stay (HOSPITAL_BASED_OUTPATIENT_CLINIC_OR_DEPARTMENT_OTHER): Payer: Medicare HMO

## 2015-04-27 ENCOUNTER — Inpatient Hospital Stay (HOSPITAL_COMMUNITY): Payer: Medicare HMO | Admitting: Anesthesiology

## 2015-04-27 ENCOUNTER — Encounter (HOSPITAL_COMMUNITY): Payer: Self-pay | Admitting: *Deleted

## 2015-04-27 ENCOUNTER — Encounter (HOSPITAL_COMMUNITY)
Admission: EM | Disposition: A | Discharge status: Home Health | Payer: Self-pay | Source: Home / Self Care | Attending: Internal Medicine

## 2015-04-27 DIAGNOSIS — I509 Heart failure, unspecified: Secondary | ICD-10-CM | POA: Diagnosis not present

## 2015-04-27 DIAGNOSIS — R634 Abnormal weight loss: Secondary | ICD-10-CM | POA: Insufficient documentation

## 2015-04-27 DIAGNOSIS — K2270 Barrett's esophagus without dysplasia: Secondary | ICD-10-CM

## 2015-04-27 DIAGNOSIS — R933 Abnormal findings on diagnostic imaging of other parts of digestive tract: Secondary | ICD-10-CM | POA: Insufficient documentation

## 2015-04-27 DIAGNOSIS — F1020 Alcohol dependence, uncomplicated: Secondary | ICD-10-CM

## 2015-04-27 DIAGNOSIS — K852 Alcohol induced acute pancreatitis without necrosis or infection: Secondary | ICD-10-CM | POA: Insufficient documentation

## 2015-04-27 DIAGNOSIS — R1314 Dysphagia, pharyngoesophageal phase: Secondary | ICD-10-CM

## 2015-04-27 HISTORY — PX: ESOPHAGOGASTRODUODENOSCOPY (EGD) WITH PROPOFOL: SHX5813

## 2015-04-27 LAB — COMPREHENSIVE METABOLIC PANEL
ALBUMIN: 2.2 g/dL — AB (ref 3.5–5.0)
ALT: 28 U/L (ref 17–63)
AST: 60 U/L — AB (ref 15–41)
Alkaline Phosphatase: 123 U/L (ref 38–126)
Anion gap: 4 — ABNORMAL LOW (ref 5–15)
BUN: 7 mg/dL (ref 6–20)
CHLORIDE: 109 mmol/L (ref 101–111)
CO2: 21 mmol/L — AB (ref 22–32)
CREATININE: 0.94 mg/dL (ref 0.61–1.24)
Calcium: 8 mg/dL — ABNORMAL LOW (ref 8.9–10.3)
GFR calc non Af Amer: >60 mL/min (ref >60)
Glucose, Bld: 81 mg/dL (ref 65–99)
Potassium: 4.4 mmol/L (ref 3.5–5.1)
SODIUM: 134 mmol/L — AB (ref 135–145)
Total Bilirubin: 1.5 mg/dL — ABNORMAL HIGH (ref 0.3–1.2)
Total Protein: 6.2 g/dL — ABNORMAL LOW (ref 6.5–8.1)

## 2015-04-27 LAB — CBC
HCT: 33 % — ABNORMAL LOW (ref 39.0–52.0)
Hemoglobin: 11.7 g/dL — ABNORMAL LOW (ref 13.0–17.0)
MCH: 37.1 pg — AB (ref 26.0–34.0)
MCHC: 35.5 g/dL (ref 30.0–36.0)
MCV: 104.8 fL — AB (ref 78.0–100.0)
PLATELETS: 58 10*3/uL — AB (ref 150–400)
RBC: 3.15 MIL/uL — AB (ref 4.22–5.81)
RDW: 18 % — ABNORMAL HIGH (ref 11.5–15.5)
WBC: 4.3 10*3/uL (ref 4.0–10.5)

## 2015-04-27 LAB — GLUCOSE, CAPILLARY: Glucose-Capillary: 74 mg/dL (ref 65–99)

## 2015-04-27 LAB — LIPASE, BLOOD: Lipase: 611 U/L — ABNORMAL HIGH (ref 22–51)

## 2015-04-27 SURGERY — ESOPHAGOGASTRODUODENOSCOPY (EGD) WITH PROPOFOL
Anesthesia: Monitor Anesthesia Care

## 2015-04-27 MED ORDER — PHENYLEPHRINE HCL 10 MG/ML IJ SOLN
INTRAMUSCULAR | Status: DC | PRN
  Administered 2015-04-27 (×2): 40 ug via INTRAVENOUS

## 2015-04-27 MED ORDER — SODIUM CHLORIDE 0.9 % IV SOLN
INTRAVENOUS | Status: DC
  Administered 2015-04-27: 500 mL via INTRAVENOUS
  Administered 2015-04-28: 12:00:00 via INTRAVENOUS

## 2015-04-27 MED ORDER — LACTATED RINGERS IV SOLN
INTRAVENOUS | Status: DC | PRN
  Administered 2015-04-27: 15:00:00 via INTRAVENOUS

## 2015-04-27 MED ORDER — BOOST / RESOURCE BREEZE PO LIQD
1.0000 | Freq: Three times a day (TID) | ORAL | Status: DC
  Administered 2015-04-27 – 2015-04-30 (×5): 1 via ORAL

## 2015-04-27 MED ORDER — BUTAMBEN-TETRACAINE-BENZOCAINE 2-2-14 % EX AERO
INHALATION_SPRAY | CUTANEOUS | Status: DC | PRN
  Administered 2015-04-27: 2 via TOPICAL

## 2015-04-27 MED ORDER — LACTATED RINGERS IV SOLN
INTRAVENOUS | Status: DC
  Administered 2015-04-27: 17:00:00 via INTRAVENOUS

## 2015-04-27 MED ORDER — PROPOFOL 500 MG/50ML IV EMUL
INTRAVENOUS | Status: DC | PRN
  Administered 2015-04-27: 75 ug/kg/min via INTRAVENOUS

## 2015-04-27 NOTE — Consult Note (Signed)
Referring Provider:  [facility] Primary Care Physician:  Pcp Not In System Primary Gastroenterologist:  Dr.Gessner  Reason for Consultation:   Dysphagia, esophageal wall thickening  HPI: Patrick [name] is a 64 y.o. male who is known to Dr. Carlean Purl. He has chronic hepatitis C with cirrhosis, history of alcohol abuse and polysubstance abuse. He has a history of a tubulovillous adenoma of the rectum and a history of Barrett's esophagus and esophageal varices. Medical history significant for CAD, thrombocytopenia, GERD, arthritis, thrombocytopenia hyperlipidemia, and MI 3. He was evaluated by Dr. Carlean Purl in July 2016 with severe dysphagia and weight loss. Given his history of Barrett's esophagus and his unintentional weight loss coupled with dysphagia the concern was for esophageal cancer. He presented to the emergency room on July 29 with complaints of chest pain which was intermittent. Troponins were negative. Chest x-ray was clear. Patient did have new EKG changes and was evaluated by cardiology. Cardiac enzymes were negative 3. NST was completed and patient was noted to have mild nausea vomiting during the stress portion but no EKG changes. Resting and stress images were reviewed with Dr. Meda Coffee who felt there was no ischemia and that the patient's symptoms were noncardiac and likely GI related. He was evaluated by Dr. Fuller Plan and underwent an EGD on 03/08/2015 that showed Barrett's esophagus and abnormal mucosa in the antrum and body of the gastric mucosa. He was seen in follow-up by Dr. Carlean Purl on 03/30/2015 at which time he reported his dysphagia was better and his appetite was better but he was complaining of numbness through his thorax into his lower extremities. He presented to the emergency room last evening with generalized numbness from midchest down. In the emergency room he was noted to have an alcohol level of 237, negative troponins, white blood count 3.9. Platelets 104. Lipase 113.  Head CT was performed and was negative for acute abnormalities. He had an abdominal ultrasound that showed no gallstones or wall thickening. Pancreas had borderline dilatation of the dorsal pancreatic duct at 3 mm. Pancreatic head poorly seen. Coarse echogenic liver with poor sonic penetration compatible with diffuse hepatic steatosis. Patient also had thoracic spine films that showed a 4 cm segment of abnormal esophageal wall thickening at the thoracic inlet, suspicious for esophageal carcinoma. Possible enlarge 14 mm lymph node in the right tracheoesophageal groove. Recommend endoscopy. Pt also says she was seen by dermatology 1-2 weeks ago and dx with vasculitis. He says he was advised to f/u with PCP.Pt admits that he continues to drink a bottle of red wine daily.  Past Medical History  Diagnosis Date  . MI (myocardial infarction)     x3, jan, aug, nov 2011; treated at [facility] in [place]  . Sexual dysfunction   . HTN (hypertension)   . Hyperlipidemia   . Chronic chest pain   . CAD (coronary artery disease)   . Thrombocytopenia   . SSBE (short-segment Barrett's esophagus) 11/11/11  . Colon adenomas 11/11/11    x 5, one rectal tv adenoma  . GERD (gastroesophageal reflux disease)   . 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 Barrett's esophagus 11/17/2011    Short-segment dx 11/2011   . Arthritis     knees  . Cataract     bil removed  . Anemia   . Alcoholism 01/02/2012    cocaine abuse, herion abuse in past    Past Surgical History  Procedure Laterality Date  . Hernia repair      very young  .  Balloon angioplasty, artery  03/2010  . Colonoscopy  11/11/11    Dr. Silvano Rusk  . Esophagogastroduodenoscopy  11/11/11    Dr. Silvano Rusk  . Cardiac catheterization  01/31/2011  . Coronary angioplasty    . Cataract extraction w/phaco  08/18/2012    Procedure: CATARACT EXTRACTION PHACO AND INTRAOCULAR LENS PLACEMENT (IOC);  Surgeon: Adonis Brook, MD;  Location: [facility];  Service:  Ophthalmology;  Laterality: Right;  . Cataract extraction w/phaco Left 09/22/2012    Procedure: CATARACT EXTRACTION PHACO AND INTRAOCULAR LENS PLACEMENT (IOC);  Surgeon: Adonis Brook, MD;  Location: [facility];  Service: Ophthalmology;  Laterality: Left;  . Upper gastrointestinal endoscopy      Prior to Admission medications   Medication Sig Start Date End Date Taking? Authorizing Provider  aspirin 81 MG tablet Take 81 mg by mouth daily.    Yes Historical Provider, MD  atorvastatin (LIPITOR) 40 MG tablet Take 1 tablet (40 mg total) by mouth daily at 6 PM. Patient taking differently: Take 40 mg by mouth daily.  11/30/12  Yes Timmothy Euler, MD  carvedilol (COREG) 12.5 MG tablet Take 12.5 mg by mouth 2 (two) times daily with a meal. 09/05/12  Yes Brooke O Edmisten, PA-C  gabapentin (NEURONTIN) 300 MG capsule Take 300 mg by mouth at bedtime.  04/05/15  Yes Historical Provider, MD  lisinopril (PRINIVIL,ZESTRIL) 20 MG tablet Take 20 mg by mouth daily.    Yes Historical Provider, MD  omeprazole (PRILOSEC) 20 MG capsule Take 1 capsule (20 mg total) by mouth 2 (two) times daily. 03/08/15  Yes Ladene Artist, MD  traMADol (ULTRAM) 50 MG tablet Take 50 mg by mouth every 12 (twelve) hours as needed for moderate pain.  03/22/15  Yes Historical Provider, MD  triamcinolone cream (KENALOG) 0.1 % Apply 1 application topically 2 (two) times daily as needed (apply to rash).  04/14/15  Yes Historical Provider, MD  furosemide (LASIX) 20 MG tablet Take 1 tablet as needed for fluid retention 04/02/15   Brittainy Erie Noe, PA-C  Hydrocodone-Acetaminophen 5-300 MG TABS Take 1 tablet by mouth 2 (two) times daily as needed. 04/05/15   Historical Provider, MD  nitroGLYCERIN (NITROSTAT) 0.4 MG SL tablet Place 1 tablet (0.4 mg total) under the tongue every 5 (five) minutes as needed for chest pain. 03/03/15   Brittainy Erie Noe, PA-C  polyethylene glycol (MIRALAX / GLYCOLAX) packet Take 17 g by mouth daily as needed.  03/23/15    Historical Provider, MD    Current Facility-Administered Medications  Medication Dose Route Frequency Provider Last Rate Last Dose  . 0.9 %  sodium chloride infusion   Intravenous Continuous Ivor Costa, MD 100 mL/hr at 04/27/15 [phone]    . aspirin chewable tablet 81 mg  81 mg Oral Daily Ivor Costa, MD   81 mg at 04/26/15 1023  . atorvastatin (LIPITOR) tablet 40 mg  40 mg Oral Daily Ivor Costa, MD   40 mg at 04/26/15 1422  . chlordiazePOXIDE (LIBRIUM) capsule 10 mg  10 mg Oral TID Thurnell Lose, MD   10 mg at 04/26/15 2154  . cloNIDine (CATAPRES) tablet 0.2 mg  0.2 mg Oral Q6H PRN Thurnell Lose, MD   0.2 mg at 04/26/15 1023  . folic acid (FOLVITE) tablet 1 mg  1 mg Oral Daily Ivor Costa, MD   Stopped at 04/26/15 1434  . gabapentin (NEURONTIN) capsule 300 mg  300 mg Oral QHS Ivor Costa, MD   300 mg at 04/26/15 2155  .  heparin injection 5,000 Units  5,000 Units Subcutaneous 3 times per day Ivor Costa, MD   5,000 Units at 04/26/15 0742  . hydrALAZINE (APRESOLINE) injection 5 mg  5 mg Intravenous Q2H PRN Ivor Costa, MD      . hydrOXYzine (VISTARIL) injection 25 mg  25 mg Intramuscular Q6H PRN Ivor Costa, MD      . lisinopril (PRINIVIL,ZESTRIL) tablet 20 mg  20 mg Oral Daily Ivor Costa, MD   20 mg at 04/26/15 0941  . LORazepam (ATIVAN) injection 0-4 mg  0-4 mg Intravenous Q6H Ivor Costa, MD   0 mg at 04/26/15 0915   Followed by  . [START ON 04/28/2015] LORazepam (ATIVAN) injection 0-4 mg  0-4 mg Intravenous Q12H Ivor Costa, MD      . LORazepam (ATIVAN) tablet 1 mg  1 mg Oral Q6H PRN Ivor Costa, MD       Or  . LORazepam (ATIVAN) injection 1 mg  1 mg Intravenous Q6H PRN Ivor Costa, MD      . metoprolol tartrate (LOPRESSOR) tablet 50 mg  50 mg Oral BID Thurnell Lose, MD   50 mg at 04/26/15 2154  . morphine 2 MG/ML injection 2 mg  2 mg Intravenous Q4H PRN Ivor Costa, MD   2 mg at 04/26/15 1543  . multivitamin with minerals tablet 1 tablet  1 tablet Oral Once Virgel Manifold, MD   1 tablet at 04/26/15 0704  .  multivitamin with minerals tablet 1 tablet  1 tablet Oral Daily Ivor Costa, MD   Stopped at 04/26/15 1436  . nicotine (NICODERM CQ - dosed in mg/24 hours) patch 21 mg  21 mg Transdermal Daily Ivor Costa, MD   21 mg at 04/26/15 0943  . nitroGLYCERIN (NITROSTAT) SL tablet 0.4 mg  0.4 mg Sublingual Q5 min PRN Ivor Costa, MD      . pantoprazole (PROTONIX) injection 40 mg  40 mg Intravenous Q24H Ivor Costa, MD   40 mg at 04/27/15 6387  . polyethylene glycol (MIRALAX / GLYCOLAX) packet 17 g  17 g Oral Daily PRN Ivor Costa, MD      . sodium chloride 0.9 % injection 3 mL  3 mL Intravenous Q12H Ivor Costa, MD   3 mL at 04/26/15 0742  . thiamine (VITAMIN B-1) tablet 100 mg  100 mg Oral Daily Ivor Costa, MD       Or  . thiamine (B-1) injection 100 mg  100 mg Intravenous Daily Ivor Costa, MD   100 mg at 04/26/15 0942  . traMADol (ULTRAM) tablet 50 mg  50 mg Oral Q12H PRN Ivor Costa, MD   50 mg at 04/26/15 2155  . triamcinolone cream (KENALOG) 0.1 % 1 application  1 application Topical BID PRN Ivor Costa, MD        Allergies as of 04/26/2015  . (No Known Allergies)    Family History  Problem Relation Age of Onset  . Cancer      mother's side/fathers side, 2 uncles  . Heart attack Maternal Grandfather   . Diabetes Sister   . Stroke Sister   . Colon cancer Neg Hx   . Esophageal cancer Neg Hx   . Rectal cancer Neg Hx   . Stomach cancer Neg Hx     Social History   Social History  . Marital Status: Single    Spouse Name: N/A  . Number of Children: 3  . Years of Education: N/A   Occupational History  .  Unemployed   Social History Main Topics  . Smoking status: Current Some Day Smoker -- 0.10 packs/day for 45 years    Types: Cigarettes  . Smokeless tobacco: Never Used     Comment: 1-2 cig/day  . Alcohol Use: Yes     Comment: Pt reports drinking 2 bottles of wine tonight. "I drink a 5th of wine daily".  . Drug Use: No     Comment: Cocaine + 08/2012. Previous heroin, cocaine and marijuana  abuse.   Marland Kitchen Sexual Activity: Not Currently   Other Topics Concern  . Not on file   Social History Narrative    Review of Systems: Gen: Denies any fever, chills, sweats, anorexia, fatigue, weakness, malaise, and sleep disorder. Has had weight loss CV: Denies angina, palpitations, syncope, orthopnea, PND, peripheral edema, and claudication. Admits to chest pain. Resp: Denies dyspnea at rest, dyspnea with exercise, cough, sputum, wheezing, coughing up blood, and pleurisy. GI: Denies vomiting blood, jaundice, and fecal incontinence.   Has intermittent dysphagia. Has diffuse abdominal pain. GU : Denies urinary burning, blood in urine, urinary frequency, urinary hesitancy, nocturnal urination, and urinary incontinence. MS: Denies joint pain, limitation of movement, and swelling, stiffness, low back pain, extremity pain. Denies muscle weakness, cramps, atrophy.  Derm: Denies rash, itching, dry skin, hives, moles, warts, or unhealing ulcers.  Psych: Denies depression, anxiety, memory loss, suicidal ideation, hallucinations, paranoia, and confusion. Heme: Denies bruising, bleeding, and enlarged lymph nodes.    Physical Exam: Vital signs in last 24 hours: Temp:  [97.6 F (36.4 C)-98.6 F (37 C)] 98.3 F (36.8 C) (09/23 0526) Pulse Rate:  [80-115] 83 (09/23 0526) Resp:  [14-24] 20 (09/23 0526) BP: (95-204)/(72-155) 127/84 mmHg (09/23 0526) SpO2:  [90 %-99 %] 95 % (09/23 0526) Weight:  [146 lb 9.6 oz (66.497 kg)-153 lb (69.4 kg)] 146 lb 9.6 oz (66.497 kg) (09/23 0527) Last BM Date: 04/24/15 General:   Alert,  Well-developed, well-nourished, pleasant and cooperative in NAD Head:  Normocephalic and atraumatic. Eyes:  Sclera clear, no icterus Conjunctiva pink. Ears:  Normal auditory acuity. Nose:  No deformity, discharge,  or lesions. Mouth:  No deformity or lesions.   Neck:  Supple; no masses or thyromegaly. Lungs:  Clear throughout to auscultation.    Heart:  Regular rate and rhythm; no  murmurs, clicks, rubs,  or gallops. Abdomen:  Soft,mild tenderness epigastric area,, BS active,nonpalp mass or hsm.   Rectal:  Deferred  Msk:  Symmetrical without gross deformities. . Pulses:  Normal pulses noted. Extremities:  Without clubbing or edema. Neurologic: Alert and  oriented x4;  grossly normal neurologically. Skin: Intact without significant lesions or rashes.. Psych:  Alert and cooperative. Normal mood and affect.  Intake/Output from previous day: 09/22 0701 - 09/23 0700 In: 3 [I.V.:3] Out: -  Intake/Output this shift:    Lab Results:  Recent Labs  04/26/15 0321 04/27/15 0620  WBC 3.9* 4.3  HGB 13.0 11.7*  HCT 36.0* 33.0*  PLT 104* PENDING   BMET  Recent Labs  04/26/15 0321 04/27/15 0620  NA 136 134*  K 4.0 4.4  CL 103 109  CO2 24 21*  GLUCOSE 118* 81  BUN <5* 7  CREATININE 0.92 0.94  CALCIUM 8.7* 8.0*   LFT  Recent Labs  04/27/15 0620  PROT 6.2*  ALBUMIN 2.2*  AST 60*  ALT 28  ALKPHOS 123  BILITOT 1.5*      Studies/Results: Dg Ankle Complete Right  04/26/2015   CLINICAL DATA:  Severe right ankle  pain for the past 3 months. No known injury.  EXAM: RIGHT ANKLE - COMPLETE 3+ VIEW  COMPARISON:  None.  FINDINGS: Mild distal medial malleolus spur formation. Arterial calcifications and calcified phleboliths. No effusion seen.  IMPRESSION: Mild degenerative change.   Electronically Signed   By: Claudie Revering M.D.   On: 04/26/2015 15:19   Ct Head Wo Contrast  04/26/2015   CLINICAL DATA:  Initial evaluation for left lower extremity numbness.  EXAM: CT HEAD WITHOUT CONTRAST  TECHNIQUE: Contiguous axial images were obtained from the base of the skull through the vertex without intravenous contrast.  COMPARISON:  Prior study from 11/16/2010.  FINDINGS: There is no acute intracranial hemorrhage or infarct. No mass lesion or midline shift. Gray-white matter differentiation is well maintained. Ventricles are normal in size without evidence of hydrocephalus.  CSF containing spaces are within normal limits. No extra-axial fluid collection. Generalized cerebral atrophy with mild chronic microvascular ischemic disease present. Scattered vascular calcifications within the carotid siphons.  The calvarium is intact.  Orbital soft tissues are within normal limits.  The paranasal sinuses and mastoid air cells are well pneumatized and free of fluid.  Scalp soft tissues are unremarkable.  IMPRESSION: 1. No acute intracranial process. 2. Generalized age-related cerebral atrophy with chronic microvascular ischemic disease.   Electronically Signed   By: Jeannine Boga M.D.   On: 04/26/2015 04:19   Mr Thoracic Spine W Wo Contrast  04/26/2015   CLINICAL DATA:  64 year old male with numbness from the chest down for several months, progressively increasing. Generalized pain in weakness. Unintentional weight loss x3 months. Initial encounter.  EXAM: MRI THORACIC SPINE WITHOUT AND WITH CONTRAST  TECHNIQUE: Multiplanar and multiecho pulse sequences of the thoracic spine were obtained without and with intravenous contrast.  CONTRAST:  15 mL MultiHance  COMPARISON:  Chest radiograph 03/02/2015.  FINDINGS: Widespread nonspecific decreased bone marrow signal in the visible skeleton. However, no thoracic compression fracture, thoracic vertebral destructive osseous lesion or marrow edema.  Thoracic spinal canal is patent without stenosis. No abnormal intradural enhancement. Thoracic spinal cord signal appears within normal limits. The conus medullaris appears normal at the T12 level.  Negative visualized posterior paraspinal soft tissues. The thoracic inlet is reported below. Elsewhere the visualized mediastinal structures appear within normal limits. Lung parenchyma remarkable for bilateral lower lobe bronchiectasis and/or peribronchial increased signal. Negative visualized upper abdominal viscera.  At the thoracic inlet, the level of the thyroid, there is a nearly 4.5 cm segment of  severe circumferential esophageal wall thickening (series 9, image 6, series 11, image 10, series 7, image 6). The wall thickening is up to 10 mm. The lumen in this region appears irregular on series 11, image 10. There is diffuse enhancement of the thickened wall. To the right of the trachea. In the right tracheoesophageal groove there is a 14 mm T1 and T2 hyperintense nodule which might enhance, suspicious for lymphadenopathy.  IMPRESSION: 1. 4 cm segment of abnormal esophageal wall thickening at the thoracic inlet, suspicious for esophageal carcinoma. Possible enlarged 14 mm lymph node in the right tracheoesophageal groove. Recommend endoscopy. 2. Abnormal bone marrow signal diffusely, without destructive lesion or marrow edema in the thoracic spine. Query anemia causing red marrow reactivation. Otherwise metastatic disease to bone cannot be excluded. 3. No thoracic spinal stenosis, cord compression, or thoracic spinal cord abnormality identified to explain the sensory changes.   Electronically Signed   By: Genevie Ann M.D.   On: 04/26/2015 14:33   US Abdomen Complete  04/26/2015   CLINICAL DATA:  Abdominal pain for several months.  Pancreatitis.  EXAM: ULTRASOUND ABDOMEN COMPLETE  COMPARISON:  None.  FINDINGS: Gallbladder: No gallstones or wall thickening visualized. Sonographic Murphy sign indeterminate due to pain medication.  Common bile duct: Diameter: 4 mm  Liver: No focal lesion identified. Coarse echogenic liver with poor sonic penetration compatible with diffuse hepatic steatosis.  IVC: No abnormality visualized.  Pancreas: Borderline dilatation of the dorsal pancreatic duct at 3 mm. Pancreatic head poorly seen.  Spleen: Size and appearance within normal limits.  Right Kidney: Length: 6.0 cm. Severely atrophic/dysplastic right kidney, with several associated calcifications.  Left Kidney: Length: 13.3 cm. Echogenicity within normal limits. No mass or hydronephrosis visualized.  Abdominal aorta: No aneurysm  visualized. Marginal atherosclerotic calcification.  Other findings: None.  IMPRESSION: 1. Borderline dilatation of the dorsal pancreatic duct. The pancreatic head is poorly seen. Pancreatic body and tail appear otherwise unremarkable. No pseudocyst identified. 2. Severely atrophic/dysplastic right kidney. Compensatory hypertrophy of the left kidney. 3. Coarse echogenic liver with poor sonic penetration compatible with diffuse hepatic steatosis.   Electronically Signed   By: Van Clines M.D.   On: 04/26/2015 08:43   EGD 03/08/15:  ENDOSCOPIC IMPRESSION: 1. Barrett's esophagus in the distal esophagus; multiple biopsies performed; dilation using a savary dilator over guidewire 2. Abnormal mucosa in the gastric body and antrum; possible gastritis, portal gastropathy; multiple biopsies performed 3. The EGD otherwise appeared normal RECOMMENDATIONS: 1. Await pathology results 2. Anti-reflux regimen 3. PPI: omeprazole 20 mg po bid, 3 months of refills 4. Post dilation instructions 5. Follow-up in 2-3 weeks with Dr. Carlean Purl Pathology:1. Surgical [P], gastric antrum and body INACTIVE CHRONIC GASTRITIS NO HELICOBACTER PYLORI ORGANISMS ARE IDENTIFIED (WARTHIN-STARRY STAIN) 2. Surgical [P], GE junction SQUAMOUS AND CARDIAC MUCOSA, NEGATIVE FOR DYSPLAISA  EGD 11/11/2011: Barrett's, possible in the distal esophagus. Abnormal mucosa in the body and antrum of the stomach, otherwise normal examination. Pathology had hyperemia with chronic inflammation. No Helicobacter pylori, dysplasia, or malignancy. Esophageal biopsies with focal intestinal metaplasia consistent with Barrett's esophagus.  Colonoscopy 11/11/2011: 5 polyps were found. 8-10 mm descending polyp removed with snare cautery. 2 descending polyps largest 7 mm, removed cold snare. 2 sigmoid polyps 3 and 5 mm removed cold snare. 2.5 cm rectal polyp removed with piecemeal snare cautery. Scattered diverticuli were found in the sigmoid colon. This was an  otherwise normal examination of the colon. Pathology revealed a descending polyp tubular adenoma. No high-grade dysplasia or malignancy identified. Descending sigmoid polyps tubular adenomas and hyperplastic polyp. No high-grade dysplasia or malignancy identified. Rectal polyp fragments of tubulovillous adenoma no high-grade dysplasia or malignancy identified. Patient was advised that he may need colonoscopy in 3-6 months to ensure rectal polyp completely removed.   IMPRESSION/PLAN: Pancreatitis and abd pain. U/S with borderline dilatation of pancreatic duct.  Pancreatitis likely due to ETOH. Pt encouraged to abstain. CIWA protocol.  IVF, antiemetics.Will sched MRI/MRCP to eval dilated panc duct. Trend LFTs, lipase.  Chest pain. Likely not cardiac. T spine films with 4 cm segm,ent of abnl esophagus. Pt s/p EGD 03/08/15--see report above. Will repeat EGD later today. The risks, benefits, and alternatives to endoscopy with possible biopsy and possible dilation were discussed with the patient and they consent to proceed.   Hx TV adenoma--overdue for repeat colonoscopy. Will plan on repeating as out patient.    Hvozdovic, Deloris Ping 04/27/2015,  Pager [phone]

## 2015-04-27 NOTE — Op Note (Signed)
[facility] [place], [id number]   ENDOSCOPY PROCEDURE REPORT  PATIENT: Patrick [name], Patrick [name]  MR#: [id number] BIRTHDATE: 1951/06/01 , 64  yrs. old GENDER: male ENDOSCOPIST: Jerene Bears, MD REFERRED BY:  [facility] PROCEDURE DATE:  04/27/2015 PROCEDURE:  EGD, diagnostic and EGD w/ biopsy ASA CLASS:     Class III INDICATIONS:  dysphagia and abnormal MRI of t-spine suggestive proximal esophageal tumor, history of Barrett's esophagus. MEDICATIONS: Monitored anesthesia care and Per Anesthesia TOPICAL ANESTHETIC: none  DESCRIPTION OF PROCEDURE: After the risks benefits and alternatives of the procedure were thoroughly explained, informed consent was obtained.  The Pentax Gastroscope [id number] endoscope was introduced through the mouth and advanced to the second portion of the duodenum , Without limitations.  The instrument was slowly withdrawn as the mucosa was fully examined.   ESOPHAGUS: A near circumferential firm and fungating mass with friable surfaces was found 20 cm from the incisors.  This mass causes luminal narrowing but is current nonobstructing (the standard Pentax adult upper endoscopy traverses this area with no real resistance).  The mass is approximately 2-3 cm in length. There is approximately 3-4 cm above the mass before the UES. Multiple biopsies were performed using cold forceps.  Sample sent for histology.   There was short segment Barrett's esophagus found in the lower third of the esophagus which was recently biopsied. No distal esophageal nodularity seen.  STOMACH: Moderate gastropathy was found in the entire examined stomach.  DUODENUM: The duodenal mucosa showed no abnormalities in the bulb and 2nd part of the duodenum.  Retroflexed views revealed as previously described.     The scope was then withdrawn from the patient and the procedure completed.  COMPLICATIONS: There were no immediate  complications.   ENDOSCOPIC IMPRESSION: 1.   Near circumferential mass was found in the upper esophagus, 20 cm from the incisors; multiple biopsies were performed 2.   There was short segment Barrett's esophagus found in the lower third of the esophagus 3.   Gastropathy was found in the entire examined stomach 4.   The duodenal mucosa showed no abnormalities in the bulb and 2nd part of the duodenum  RECOMMENDATIONS: 1.  Await biopsy results 2.  Will need to maintain a full liquid diet for now 3.  Anticipate the need for oncology and radiation oncology consultation 4.  Supportive care for pancreatitis and total alcohol cessation strongly recommended 5.   Daily PPI   eSigned:  Jerene Bears, MD 04/27/2015 3:58 PM    CC: the patient, Carlean Purl  PATIENT NAME:  Temple, [name] MR#: [id number]

## 2015-04-27 NOTE — Progress Notes (Signed)
PT Cancellation Note  Patient Details Name: Meshulem Onorato MRN: [id number] DOB: 10/19/1950   Cancelled Treatment:    Reason Eval/Treat Not Completed: Other (comment) (Pt declined as he is feeling poorly).  Try again tomorrow as pt is going to testing this afternoon.   Ramond Dial 04/27/2015, 11:33 AM   Mee Hives, PT MS Acute Rehab Dept. Number: [facility] [id number] and [place] [phone]

## 2015-04-27 NOTE — Progress Notes (Signed)
Initial Nutrition Assessment  DOCUMENTATION CODES:   Not applicable  INTERVENTION:   -Boost Breeze po TID, each supplement provides 250 kcal and 9 grams of protein  NUTRITION DIAGNOSIS:   Inadequate oral intake related to inability to eat as evidenced by NPO status.  GOAL:   Patient will meet greater than or equal to 90% of their needs  MONITOR:   PO intake, Supplement acceptance, Diet advancement, Labs, Weight trends, Skin, I & O's  REASON FOR ASSESSMENT:   Malnutrition Screening Tool    ASSESSMENT:   Patrick [name] is a 64 y.o. male with PMH of MI, HTN, HLD, CAD, chronic chest pain, GERD, ankle abuse, tobacco abuse, Patrick [name] the esophagus, HCV, cirrhosis, anemia, vasculitis, Raynaud's syndrome, thrombocytopenia, who presents with numbness, abdominal pain, fall, ankle swelling, weight loss, pain all over.  [name] with numbness. 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   Hx obtained by bedside, who reports anxiety over upcoming GI procedures. He endorses poor appetite and weight loss over the past 4-6 weeks. PTA, he was consuming a liquid diet of mainly soup, due to a decreased appetite and feeling of esophagus narrowing. He reports he was just given some ice chips, which he tolerated well.   Pt reports he has lost weight, however, unable to provide a reliable wt hx. Per documented wt hx, UBW around 170#. Noted a 14# (8.8%) wt loss over the past month. Pt also reports he has been very unsteady on his feet and has become much less mobile due to fear of falling.   Nutrition-Focused physical exam completed. Findings are no fat depletion, mild muscle depletion, and no edema.   Pt reports he has a difficult time tolerating milk products, but enjoys ice cream. Discussed importance of good nutritional intake to aid in recovery. Pt amenable to try supplements once diet is advanced.   Addendum: Pt just advanced to clear liquid diet.   Labs reviewed:  Na: 134 (on IV  supplementation). Also noted pt has been ordered, folvite, MVI, and thiamine.   Diet Order:  Diet clear liquid Room service appropriate?: Yes; Fluid consistency:: Thin  Skin:  Reviewed, no issues  Last BM:  04/24/15  Height:   Ht Readings from Last 1 Encounters:  04/26/15 5\' 9"  (1.753 m)    Weight:   Wt Readings from Last 1 Encounters:  04/27/15 146 lb 9.6 oz (66.497 kg)    Ideal Body Weight:  72.7 kg  BMI:  Body mass index is 21.64 kg/(m^2).  Estimated Nutritional Needs:   Kcal:  1800-2000  Protein:  80-95 grams  Fluid:  >1.8 L  EDUCATION NEEDS:   Education needs addressed  Patrick [name], RD, LDN, CDE Pager: [phone] After hours Pager: [phone]

## 2015-04-27 NOTE — Progress Notes (Signed)
  Echocardiogram 2D Echocardiogram has been performed.  Tresa Res 04/27/2015, 11:02 AM

## 2015-04-27 NOTE — Anesthesia Postprocedure Evaluation (Signed)
Anesthesia Post Note  Patient: Patrick [name]  Procedure(s) Performed: Procedure(s) (LRB): ESOPHAGOGASTRODUODENOSCOPY (EGD) WITH PROPOFOL (N/A)  Anesthesia type: MAC  Patient location: PACU  Post pain: Pain level controlled  Post assessment: Post-op Vital signs reviewed  Last Vitals: BP 119/85 mmHg  Pulse 88  Temp(Src) 36.5 C (Oral)  Resp 26  Ht 5\' 9"  (1.753 m)  Wt 146 lb 9.6 oz (66.497 kg)  BMI 21.64 kg/m2  SpO2 95%  Post vital signs: Reviewed  Level of consciousness: awake  Complications: No apparent anesthesia complications

## 2015-04-27 NOTE — Progress Notes (Signed)
Patient Demographics:    Patrick [name], is a 64 y.o. male, DOB - 1951/06/11, RFX:[id number]  Admit date - 04/26/2015   Admitting Physician Ivor Costa, MD  Outpatient Primary MD for the patient is Pcp Not In System  LOS - 1   Chief Complaint  Patient presents with  . Numbness        Subjective:    Patrick [name] today has, No headache, No chest pain, No abdominal pain - No Nausea, No new weakness tingling or numbness, No Cough - SOB. He complains of generalized weakness and numbness below his chest.   Assessment  & Plan :     1. Chronic numbness starting from chest all the way down to both feet ongoing for several months. Patient claims that he has a pending appointment with [facility] neurology on 05/16/2015. He denies any back pain. Does not have any objective weakness. No signs of cauda equina. B-12 is borderline, TSH stable, RPR - HIV &  Uric acid Stable. ESR is elevated and CT shows possible Esophageal Mass with possible T spine Met. Continue PT and ASA, Supp rx.   2. Pain in both feet. Likely from #1 above.   3. History of alcoholic cirrhosis. Consumes large amounts of alcohol on a daily basis. Counseled to quit. Is probably going and early DTs. Placed on scheduled Librium, thiamine and folic acid along with CIWA protocol. GI called.   4. Essential hypertension. Her pressure medications adjusted, confirmed with patient not been using cocaine for the last 3 months.    5. Dyslipidemia on statin continue.   6. Possible mild pancreatitis. Likely alcohol induced. Bowel rest, IV fluids, pain control, lipid panel shows triglycerides of 96, ultrasound abdomen shows CBD at 4 mm. AST elevated in the pattern of alcohol abuse. Supportive care and monitor.  7. Esophageal Mass - GI called.    Code  Status : Full  Family Communication  : None present  Disposition Plan  : Remain inpatient next 1-2 days  Consults  :  GI  Procedures  :   CT head unremarkable  MRI T-spine ordered  DVT Prophylaxis  :    SCD  Lab Results  Component Value Date   PLT 58* 04/27/2015    Inpatient Medications  Scheduled Meds: . aspirin  81 mg Oral Daily  . atorvastatin  40 mg Oral Daily  . chlordiazePOXIDE  10 mg Oral TID  . folic acid  1 mg Oral Daily  . gabapentin  300 mg Oral QHS  . lisinopril  20 mg Oral Daily  . LORazepam  0-4 mg Intravenous Q6H   Followed by  . [START ON 04/28/2015] LORazepam  0-4 mg Intravenous Q12H  . metoprolol tartrate  50 mg Oral BID  . multivitamin with minerals  1 tablet Oral Once  . multivitamin with minerals  1 tablet Oral Daily  . nicotine  21 mg Transdermal Daily  . pantoprazole (PROTONIX) IV  40 mg Intravenous Q24H  . sodium chloride  3 mL Intravenous Q12H  . thiamine  100 mg Oral Daily   Or  . thiamine  100 mg Intravenous Daily   Continuous Infusions: . sodium chloride 100 mL/hr at 04/27/15 0637  . sodium chloride 500 mL (04/27/15 1341)  .  lactated ringers     PRN Meds:.cloNIDine, hydrALAZINE, hydrOXYzine, LORazepam **OR** LORazepam, morphine injection, nitroGLYCERIN, polyethylene glycol, traMADol, triamcinolone cream  Antibiotics  :     Anti-infectives    None        Objective:   Filed Vitals:   04/27/15 0825 04/27/15 1325 04/27/15 1337 04/27/15 1339  BP: 121/85 128/74 132/93   Pulse: 83 84    Temp:  98 F (36.7 C)  97.7 F (36.5 C)  TempSrc:  Oral Oral Oral  Resp:  20 16   Height:      Weight:      SpO2:  95% 96%     Wt Readings from Last 3 Encounters:  04/27/15 66.497 kg (146 lb 9.6 oz)  04/02/15 72.576 kg (160 lb)  03/20/15 73.437 kg (161 lb 14.4 oz)     Intake/Output Summary (Last 24 hours) at 04/27/15 1524 Last data filed at 04/27/15 1300  Gross per 24 hour  Intake      0 ml  Output    100 ml  Net   -100 ml      Physical Exam  Awake Alert, Oriented X 3, No new F.N deficits, Normal affect [place].AT,PERRAL Supple Neck,No JVD, No cervical lymphadenopathy appriciated.  Symmetrical Chest wall movement, Good air movement bilaterally, CTAB RRR,No Gallops,Rubs or new Murmurs, No Parasternal Heave +ve B.Sounds, Abd Soft, No tenderness, No organomegaly appriciated, No rebound - guarding or rigidity. No Cyanosis, Clubbing or edema, No new Rash or bruise       Data Review:   Micro Results Recent Results (from the past 240 hour(s))  Culture, blood (routine x 2)     Status: None (Preliminary result)   Collection Time: 04/26/15  3:22 PM  Result Value Ref Range Status   Specimen Description BLOOD RIGHT HAND  Final   Special Requests BOTTLES DRAWN AEROBIC AND ANAEROBIC 5CC  Final   Culture NO GROWTH < 24 HOURS  Final   Report Status PENDING  Incomplete  Culture, blood (routine x 2)     Status: None (Preliminary result)   Collection Time: 04/26/15  3:28 PM  Result Value Ref Range Status   Specimen Description BLOOD LEFT ARM  Final   Special Requests BOTTLES DRAWN AEROBIC AND ANAEROBIC 5CC  Final   Culture NO GROWTH < 24 HOURS  Final   Report Status PENDING  Incomplete    Radiology Reports Dg Ankle Complete Right  04/26/2015   CLINICAL DATA:  Severe right ankle pain for the past 3 months. No known injury.  EXAM: RIGHT ANKLE - COMPLETE 3+ VIEW  COMPARISON:  None.  FINDINGS: Mild distal medial malleolus spur formation. Arterial calcifications and calcified phleboliths. No effusion seen.  IMPRESSION: Mild degenerative change.   Electronically Signed   By: Claudie Revering M.D.   On: 04/26/2015 15:19   Ct Head Wo Contrast  04/26/2015   CLINICAL DATA:  Initial evaluation for left lower extremity numbness.  EXAM: CT HEAD WITHOUT CONTRAST  TECHNIQUE: Contiguous axial images were obtained from the base of the skull through the vertex without intravenous contrast.  COMPARISON:  Prior study from 11/16/2010.   FINDINGS: There is no acute intracranial hemorrhage or infarct. No mass lesion or midline shift. Gray-white matter differentiation is well maintained. Ventricles are normal in size without evidence of hydrocephalus. CSF containing spaces are within normal limits. No extra-axial fluid collection. Generalized cerebral atrophy with mild chronic microvascular ischemic disease present. Scattered vascular calcifications within the carotid siphons.  The calvarium is  intact.  Orbital soft tissues are within normal limits.  The paranasal sinuses and mastoid air cells are well pneumatized and free of fluid.  Scalp soft tissues are unremarkable.  IMPRESSION: 1. No acute intracranial process. 2. Generalized age-related cerebral atrophy with chronic microvascular ischemic disease.   Electronically Signed   By: Jeannine Boga M.D.   On: 04/26/2015 04:19   Mr Thoracic Spine W Wo Contrast  04/26/2015   CLINICAL DATA:  64 year old male with numbness from the chest down for several months, progressively increasing. Generalized pain in weakness. Unintentional weight loss x3 months. Initial encounter.  EXAM: MRI THORACIC SPINE WITHOUT AND WITH CONTRAST  TECHNIQUE: Multiplanar and multiecho pulse sequences of the thoracic spine were obtained without and with intravenous contrast.  CONTRAST:  15 mL MultiHance  COMPARISON:  Chest radiograph 03/02/2015.  FINDINGS: Widespread nonspecific decreased bone marrow signal in the visible skeleton. However, no thoracic compression fracture, thoracic vertebral destructive osseous lesion or marrow edema.  Thoracic spinal canal is patent without stenosis. No abnormal intradural enhancement. Thoracic spinal cord signal appears within normal limits. The conus medullaris appears normal at the T12 level.  Negative visualized posterior paraspinal soft tissues. The thoracic inlet is reported below. Elsewhere the visualized mediastinal structures appear within normal limits. Lung parenchyma  remarkable for bilateral lower lobe bronchiectasis and/or peribronchial increased signal. Negative visualized upper abdominal viscera.  At the thoracic inlet, the level of the thyroid, there is a nearly 4.5 cm segment of severe circumferential esophageal wall thickening (series 9, image 6, series 11, image 10, series 7, image 6). The wall thickening is up to 10 mm. The lumen in this region appears irregular on series 11, image 10. There is diffuse enhancement of the thickened wall. To the right of the trachea. In the right tracheoesophageal groove there is a 14 mm T1 and T2 hyperintense nodule which might enhance, suspicious for lymphadenopathy.  IMPRESSION: 1. 4 cm segment of abnormal esophageal wall thickening at the thoracic inlet, suspicious for esophageal carcinoma. Possible enlarged 14 mm lymph node in the right tracheoesophageal groove. Recommend endoscopy. 2. Abnormal bone marrow signal diffusely, without destructive lesion or marrow edema in the thoracic spine. Query anemia causing red marrow reactivation. Otherwise metastatic disease to bone cannot be excluded. 3. No thoracic spinal stenosis, cord compression, or thoracic spinal cord abnormality identified to explain the sensory changes.   Electronically Signed   By: Genevie Ann M.D.   On: 04/26/2015 14:33   US Abdomen Complete  04/26/2015   CLINICAL DATA:  Abdominal pain for several months.  Pancreatitis.  EXAM: ULTRASOUND ABDOMEN COMPLETE  COMPARISON:  None.  FINDINGS: Gallbladder: No gallstones or wall thickening visualized. Sonographic Murphy sign indeterminate due to pain medication.  Common bile duct: Diameter: 4 mm  Liver: No focal lesion identified. Coarse echogenic liver with poor sonic penetration compatible with diffuse hepatic steatosis.  IVC: No abnormality visualized.  Pancreas: Borderline dilatation of the dorsal pancreatic duct at 3 mm. Pancreatic head poorly seen.  Spleen: Size and appearance within normal limits.  Right Kidney: Length: 6.0  cm. Severely atrophic/dysplastic right kidney, with several associated calcifications.  Left Kidney: Length: 13.3 cm. Echogenicity within normal limits. No mass or hydronephrosis visualized.  Abdominal aorta: No aneurysm visualized. Marginal atherosclerotic calcification.  Other findings: None.  IMPRESSION: 1. Borderline dilatation of the dorsal pancreatic duct. The pancreatic head is poorly seen. Pancreatic body and tail appear otherwise unremarkable. No pseudocyst identified. 2. Severely atrophic/dysplastic right kidney. Compensatory hypertrophy of the left kidney.  3. Coarse echogenic liver with poor sonic penetration compatible with diffuse hepatic steatosis.   Electronically Signed   By: Van Clines M.D.   On: 04/26/2015 08:43     CBC  Recent Labs Lab 04/26/15 0321 04/27/15 0620  WBC 3.9* 4.3  HGB 13.0 11.7*  HCT 36.0* 33.0*  PLT 104* 58*  MCV 100.0 104.8*  MCH 36.1* 37.1*  MCHC 36.1* 35.5  RDW 17.2* 18.0*  LYMPHSABS 1.7  --   MONOABS 0.4  --   EOSABS 0.0  --   BASOSABS 0.0  --     Chemistries   Recent Labs Lab 04/26/15 0321 04/27/15 0620  NA 136 134*  K 4.0 4.4  CL 103 109  CO2 24 21*  GLUCOSE 118* 81  BUN <5* 7  CREATININE 0.92 0.94  CALCIUM 8.7* 8.0*  AST 104* 60*  ALT 43 28  ALKPHOS 151* 123  BILITOT 0.7 1.5*   ------------------------------------------------------------------------------------------------------------------ estimated creatinine clearance is 74.7 mL/min (by C-G formula based on Cr of 0.94). ------------------------------------------------------------------------------------------------------------------ No results for input(s): HGBA1C in the last 72 hours. ------------------------------------------------------------------------------------------------------------------  Recent Labs  04/26/15 0714  CHOL 146  HDL 29*  LDLCALC 98  TRIG 96  CHOLHDL 5.0    ------------------------------------------------------------------------------------------------------------------  Recent Labs  04/26/15 0714  TSH 1.498   ------------------------------------------------------------------------------------------------------------------  Recent Labs  04/26/15 0714  VITAMINB12 399    Coagulation profile No results for input(s): INR, PROTIME in the last 168 hours.  No results for input(s): DDIMER in the last 72 hours.  Cardiac Enzymes  Recent Labs Lab 04/26/15 0714 04/26/15 1522 04/26/15 1945  TROPONINI 0.03 0.07* 0.06*   ------------------------------------------------------------------------------------------------------------------ Invalid input(s): POCBNP   Time Spent in minutes   35   SINGH,PRASHANT K M.D on 04/27/2015 at 3:24 PM  Between 7am to 7pm - Pager - [phone]  After 7pm go to [email or web address] - password [facility]  [facility] -  Office  [phone]

## 2015-04-27 NOTE — Care Management Note (Addendum)
Case Management Note  Patient Details  Name: Patrick [name] MRN: [id number] Date of Birth: [birth year]  Subjective/Objective:     Date: 04/27/15 Spoke with patient at the bedside. Introduced self as Tourist information centre manager and explained role in discharge planning and how to be reached. Verified patient lives in town, alone. Expressed potential need for  Rolling walker, and shower stool DME. Verified patient anticipates to go home alone,  At time of discharge He is refusing SNF, he is afraid he will loose his section 8 apartment,  and will have no supervison  at this time to best of their knowledge. Patient  denied needing help with their medication. Patient   takes the bus to MD appointments. Verified patient has PCP [place] at [facility],  Patient will need bus pass at discharge. Patient would like to have HHPT with [facility] and DME with [facility].  Will hand off to NCM on weekend.  Plan: CM will continue to follow for discharge planning and [facility] resources.                Action/Plan:   Expected Discharge Date:  04/29/15               Expected Discharge Plan:  Skilled Nursing Facility  In-House Referral:  Clinical Social Work  Discharge planning Services  CM Consult  Post Acute Care Choice:    Choice offered to:     DME Arranged:    DME Agency:     HH Arranged:  PT HH Agency:  [place]  Status of Service:  In process, will continue to follow  Medicare Important Message Given:    Date Medicare IM Given:    Medicare IM give by:    Date Additional Medicare IM Given:    Additional Medicare Important Message give by:     If discussed at [place] of Stay Meetings, dates discussed:    Additional Comments:  Zenon Mayo, RN 04/27/2015, 12:38 PM

## 2015-04-27 NOTE — Progress Notes (Signed)
Utilization review completed. Bertha Stanfill, RN, BSN. 

## 2015-04-27 NOTE — Anesthesia Procedure Notes (Signed)
Procedure Name: MAC Date/Time: 04/27/2015 3:24 PM Performed by: Garrison Columbus T Pre-anesthesia Checklist: Patient identified, Emergency Drugs available, Suction available and Patient being monitored Patient Re-evaluated:Patient Re-evaluated prior to inductionOxygen Delivery Method: Simple face mask Preoxygenation: Pre-oxygenation with 100% oxygen Intubation Type: IV induction Placement Confirmation: positive ETCO2 Dental Injury: Teeth and Oropharynx as per pre-operative assessment

## 2015-04-27 NOTE — Evaluation (Signed)
Occupational Therapy Evaluation Patient Details Name: Patrick [name] MRN: [id number] DOB: March 13, 1951 Today's Date: 04/27/2015    History of Present Illness Patrick [name] is a 64 y.o. male with hx of MI, HTN, HLD, CAD, chronic chest pain, SSBE, thrombocytopenia, drug abuse, vasculitis, Raynaud's syndrome who presents with bilateral LE weakness, numbness from chest down and pain, recent weight loss with esophogeal stricture/dilitation and now with fall at home.  MRI thoracic spine showed possible mass suspicious for esophageal carcinoma (underwent EGD with biopsy 9/23), and diffuse abnormal signal throughout thoracic spine - metastatic bone disease cannot be excluded, but no definitive tumor, cord compression, or cord abnormality seen.    Clinical Impression   Pt admitted with above. He demonstrates the below listed deficits and will benefit from continued OT to maximize safety and independence with BADLs.  Pt presents currently requires min A for ADLs and functional mobility.   He is adamant that his plan is to return home to his apartment.  IF he chooses to discharge home, he is at high risk for falls. He is unsafe to drive (he has stopped due to LE weakness), and is unsafe to take the bus.  Recommend HHOT, HHPT, HHSW, Tub transfer bench, 3in1 commode, wheelchair, and SCAT for transportation needs. Will follow acutely.      Follow Up Recommendations  SNF;Supervision/Assistance - 24 hour    Equipment Recommendations  3 in 1 bedside comode;Tub/shower bench;Wheelchair (measurements OT)    Recommendations for Other Services       Precautions / Restrictions Precautions Precautions: Fall Precaution Comments: Knees buckle      Mobility Bed Mobility Overal bed mobility: Needs Assistance Bed Mobility: Supine to Sit;Sit to Supine     Supine to sit: Supervision        Transfers Overall transfer level: Needs assistance Equipment used: Rolling walker (2 wheeled) Transfers: Sit to/from  [facility] Sit to Stand: Min guard Stand pivot transfers: Min assist       General transfer comment: min A due to bil. knee buckling     Balance Overall balance assessment: Needs assistance Sitting-balance support: Feet supported Sitting balance-Leahy Scale: Good     Standing balance support: During functional activity;Bilateral upper extremity supported Standing balance-Leahy Scale: Poor                              ADL Overall ADL's : Needs assistance/impaired Eating/Feeding: Independent;Sitting;Bed level   Grooming: Wash/dry hands;Wash/dry face;Oral care;Brushing hair;Set up;Sitting   Upper Body Bathing: Set up;Sitting   Lower Body Bathing: Minimal assistance;Sit to/from stand   Upper Body Dressing : Set up;Sitting   Lower Body Dressing: Minimal assistance;Sit to/from stand   Toilet Transfer: Minimal assistance;Stand-pivot;BSC;RW   Toileting- Clothing Manipulation and Hygiene: Minimal assistance;Sit to/from stand       Functional mobility during ADLs: Minimal assistance;Rolling walker (short distance - knees buckled ) General ADL Comments: Pt states he feels he needs a tub seat and rolling walker.  Dicussed optons with him.       Vision     Perception     Praxis      Pertinent Vitals/Pain Pain Assessment: 0-10 Pain Score: 8  Pain Location: knees, feet  Pain Descriptors / Indicators: Burning;Pins and needles Pain Intervention(s): Monitored during session;Limited activity within patient's tolerance     Hand Dominance Right   Extremity/Trunk Assessment Upper Extremity Assessment Upper Extremity Assessment: Generalized weakness;RUE deficits/detail;LUE deficits/detail RUE Deficits / Details: grossly 4+/5.  Pt reports pins/needles sensation dorsum bil hands LUE Deficits / Details: grossly 4+/5.  Pt reports pins/needles sensation dorsum bil hands   Lower Extremity Assessment Lower Extremity Assessment: Defer to PT evaluation        Communication Communication Communication: No difficulties   Cognition Arousal/Alertness: Awake/alert Behavior During Therapy: WFL for tasks assessed/performed Overall Cognitive Status: Within Functional Limits for tasks assessed                     General Comments       Exercises       Shoulder Instructions      Home Living Family/patient expects to be discharged to:: Private residence Living Arrangements: Alone Available Help at Discharge: Friend(s);Available PRN/intermittently Type of Home: Apartment Home Access: Stairs to enter Entrance Stairs-Number of Steps: 6 Entrance Stairs-Rails: Right Home Layout: One level     Bathroom Shower/Tub: Tub/shower unit Shower/tub characteristics: Curtain Biochemist, clinical: Standard     Home Equipment: Cane - single point          Prior Functioning/Environment Level of Independence: Independent with assistive device(s)        Comments: Pt reports he has been able to perform ADLs, driving and ambulating independently until the past two weeks.  He has started using a SPC, sponge bathing due to fear of falling in tub, and has stopped driving due to fear of not being able to control his legs while driving .  He helps is 53 y.o. mother several times a week     OT Diagnosis: Generalized weakness;Acute pain;Paresis   OT Problem List: Decreased strength;Decreased activity tolerance;Impaired balance (sitting and/or standing);Decreased safety awareness;Decreased knowledge of use of DME or AE;Decreased knowledge of precautions;Impaired sensation;Pain;Impaired UE functional use   OT Treatment/Interventions: Self-care/ADL training;DME and/or AE instruction;Therapeutic activities;Patient/family education;Balance training    OT Goals(Current goals can be found in the care plan section) Acute Rehab OT Goals Patient Stated Goal: to go back home  OT Goal Formulation: With patient Time For Goal Achievement: 05/11/15 Potential to  Achieve Goals: Good ADL Goals Pt Will Perform Lower Body Bathing: with supervision;sit to/from stand Pt Will Perform Lower Body Dressing: with supervision;sit to/from stand Pt Will Transfer to Toilet: ambulating;bedside commode;grab bars;regular height toilet;with min guard assist Pt Will Perform Toileting - Clothing Manipulation and hygiene: with supervision;sit to/from stand Pt Will Perform Tub/Shower Transfer: Tub transfer;with supervision;tub bench;rolling walker Pt/caregiver will Perform Home Exercise Program: Increased strength;Both right and left upper extremity;With theraband;With written HEP provided  OT Frequency: Min 3X/week   Barriers to D/C: Decreased caregiver support          Co-evaluation              End of Session Equipment Utilized During Treatment: Rolling walker Nurse Communication: Mobility status  Activity Tolerance: Patient limited by pain Patient left: in bed;with call bell/phone within reach;with bed alarm set   Time: 2947-6546 OT Time Calculation (min): 37 min Charges:  OT General Charges $OT Visit: 1 Procedure OT Evaluation $Initial OT Evaluation Tier I: 1 Procedure OT Treatments $Self Care/Home Management : 8-22 mins G-Codes:    Conarpe, Wendi M 05/13/15, 6:10 PM

## 2015-04-27 NOTE — Transfer of Care (Signed)
Immediate Anesthesia Transfer of Care Note  Patient: Patrick [name]  Procedure(s) Performed: Procedure(s): ESOPHAGOGASTRODUODENOSCOPY (EGD) WITH PROPOFOL (N/A)  Patient Location: Endoscopy Unit  Anesthesia Type:MAC  Level of Consciousness: awake, alert  and oriented  Airway & Oxygen Therapy: Patient Spontanous Breathing and Patient connected to nasal cannula oxygen  Post-op Assessment: Report given to RN, Post -op Vital signs reviewed and stable and Patient moving all extremities X 4  Post vital signs: Reviewed and stable  Last Vitals:  Filed Vitals:   04/27/15 1339  BP:   Pulse:   Temp: 36.5 C  Resp:     Complications: No apparent anesthesia complications

## 2015-04-27 NOTE — Anesthesia Preprocedure Evaluation (Addendum)
Anesthesia Evaluation  Patient identified by MRN, date of birth, ID band Patient awake    Reviewed: Allergy & Precautions, H&P , NPO status , Patient's Chart, lab work & pertinent test results  History of Anesthesia Complications Negative for: history of anesthetic complications  Airway Mallampati: I  TM Distance: >3 FB Neck ROM: Full    Dental  (+) Edentulous Upper, Dental Advisory Given   Pulmonary Current Smoker,    + rhonchi        Cardiovascular hypertension, Pt. on medications and Pt. on home beta blockers + CAD and + Past MI   Rhythm:Regular Rate:Normal  Echo 04/27/2015 - Left ventricle: There is hypokinesis of the basal inferior andinferolateral walls. The cavity size was normal. There was mildconcentric hypertrophy. Systolic function was normal. Theestimated ejection fraction was in the range of 55% to 60%. Wallmotion was normal; there were no regional wall motionabnormalities. Doppler parameters are consistent with abnormalleft ventricular relaxation (grade 1 diastolic dysfunction).There was no evidence of elevated ventricular filling pressure byDoppler parameters. - Aortic valve: Trileaflet; normal thickness leaflets. There wasmild regurgitation. - Aortic root: The aortic root was normal in size. - Mitral valve: Mildly thickened leaflets . - Right ventricle: The cavity size was normal. Wall thickness wasnormal. Systolic function was normal. - Right atrium: The atrium was normal in size. - Tricuspid valve: There was trivial regurgitation. - Pulmonary arteries: Systolic pressure was within the normalrange. - Inferior vena cava: The vessel was normal in size. - Pericardium, extracardiac: There was no pericardial effusion.     Neuro/Psych PSYCHIATRIC DISORDERS Anxiety negative neurological ROS     GI/Hepatic GERD  Medicated and Controlled,(+) Hepatitis -, C  Endo/Other  negative endocrine ROS   Renal/GU negative Renal ROS     Musculoskeletal negative musculoskeletal ROS (+)   Abdominal   Peds  Hematology negative hematology ROS (+) anemia ,   Anesthesia Other Findings   Reproductive/Obstetrics negative OB ROS                           Anesthesia Physical  Anesthesia Plan  ASA: III  Anesthesia Plan: MAC   Post-op Pain Management:    Induction:   Airway Management Planned:   Additional Equipment:   Intra-op Plan:   Post-operative Plan:   Informed Consent: I have reviewed the patients History and Physical, chart, labs and discussed the procedure including the risks, benefits and alternatives for the proposed anesthesia with the patient or authorized representative who has indicated his/her understanding and acceptance.   Dental advisory given  Plan Discussed with: CRNA  Anesthesia Plan Comments:        Anesthesia Quick Evaluation

## 2015-04-27 NOTE — Clinical Social Work Note (Signed)
Clinical Social Work Assessment  Patient Details  Name: Patrick [name] MRN: [id number] Date of Birth: [birth year]  Date of referral:  04/27/15               Reason for consult:  Discharge Planning                Permission sought to share information with:  Case Manager Permission granted to share information::  No  Name::      N/A  Agency::   N/A  Relationship::   N/A  Contact Information:   N/A  Housing/Transportation Living arrangements for the past 2 months:  Apartment Source of Information:  Patient Patient Interpreter Needed:  None Criminal Activity/Legal Involvement Pertinent to Current Situation/Hospitalization:  No - Comment as needed Significant Relationships:  Parents, Other Family Members Lives with:  Self Do you feel safe going back to the place where you live?  Yes Need for family participation in patient care:  Yes (Comment)  Care giving concerns:  Patient from home alone. PT recommending SNF placement.    Social Worker assessment / plan:  Holiday representative met with patient at bedside in reference to SNF placement. CSW introduced CSW role and SNF process. CSW reviewed and provided SNF list. Pt reported he is from home alone and will not have assistance at home. Pt stated he is NOT familiar with SNF process. Pt stated he is from [place] and worked most of his life in [place], [place] before moving to [place] to help his mother care for grandfather. Grandfather passed last year. Pt stated he is NOT interested in SNF placement and declining at this time. Pt requested information on other options for rehab. CSW presented Home Health services and pt expressed understanding that home health will NOT provide 24 hour assistance or visit everyday. Pt agreeable to home health. CSW informed RNCM. CSW encouraged patient to consider placement and provided SNF list at bedside. Patient stated he would notify RNCM or bedside RN of changes. No further concerns reported by patient at this time.  Clinical Social Worker will sign off for now as social work intervention is no longer needed. Please consult [place] again if new need arises.  Employment status:  Retired Nurse, adult PT Recommendations:  [place] / Referral to community resources:  [place]  Patient/Family's Response to care: Pt alert and oriented x4. Pt is NOT agreeable to SNF placement and respectfully declined. Pt pleasant and appreciated social work intervention.   Patient/Family's Understanding of and Emotional Response to Diagnosis, Current Treatment, and Prognosis:  Pt understanding of medical intervention however declining SNF placement. CSW signing off.   Emotional Assessment Appearance:  Appears stated age Attitude/Demeanor/Rapport:   (Pleasant ) Affect (typically observed):  Pleasant, Hopeful, Calm, Apprehensive Orientation:  Oriented to Self, Oriented to Place, Oriented to  Time, Oriented to Situation Alcohol / Substance use:  Not Applicable Psych involvement (Current and /or in the community):  No (Comment)  Discharge Needs  Concerns to be addressed:  Care Coordination, Patient refuses services Readmission within the last 30 days:  No Current discharge risk:  Dependent with Mobility, Lack of support system Barriers to Discharge:  Continued Medical Work up   [employer], MSW, LCSWA [phone] 04/27/2015 11:41 AM

## 2015-04-28 ENCOUNTER — Inpatient Hospital Stay (HOSPITAL_COMMUNITY): Payer: Medicare HMO

## 2015-04-28 LAB — GLUCOSE, CAPILLARY: GLUCOSE-CAPILLARY: 86 mg/dL (ref 65–99)

## 2015-04-28 MED ORDER — CHLORDIAZEPOXIDE HCL 5 MG PO CAPS
5.0000 mg | ORAL_CAPSULE | Freq: Three times a day (TID) | ORAL | Status: DC
  Administered 2015-04-28 – 2015-04-30 (×7): 5 mg via ORAL
  Filled 2015-04-28 (×5): qty 1

## 2015-04-28 MED ORDER — LACTATED RINGERS IV SOLN: INTRAVENOUS | Status: AC

## 2015-04-28 MED ORDER — GADOBENATE DIMEGLUMINE 529 MG/ML IV SOLN
15.0000 mL | Freq: Once | INTRAVENOUS | Status: AC | PRN
  Administered 2015-04-28: 13 mL via INTRAVENOUS

## 2015-04-28 NOTE — Progress Notes (Signed)
Cross cover [facility] Subjective: Patient complains of abdominal pain and mild nausea; he says he does not feel so good today. Wants to eat.  Objective: Vital signs in last 24 hours: Temp:  [97.7 F (36.5 C)-99.1 F (37.3 C)] 99.1 F (37.3 C) (09/24 0553) Pulse Rate:  [84-103] 92 (09/24 0610) Resp:  [16-29] 18 (09/24 0553) BP: (111-145)/(74-93) 121/75 mmHg (09/24 0610) SpO2:  [94 %-96 %] 94 % (09/24 0553) Weight:  [67.1 kg (147 lb 14.9 oz)] 67.1 kg (147 lb 14.9 oz) (09/24 0553) Last BM Date: 04/24/15  Intake/Output from previous day: 09/23 0701 - 09/24 0700 In: 658 [P.O.:358; I.V.:300] Out: 200 [Urine:200] Intake/Output this shift: Total I/O In: 815 [P.O.:60; I.V.:755] Out: 100 [Urine:100]  General appearance: alert, appears stated age, fatigued, mildly obese and slowed mentation Resp: clear to auscultation bilaterally Cardio: regular rate and rhythm, S1, S2 normal, no murmur, click, rub or gallop GI: soft, obese with mild diffuse tenderness on palpation with gauridng but without rebound or rigidity; bowel sounds normal; no masses,  no organomegaly Extremities: extremities normal, atraumatic, no cyanosis or edema  Lab Results:  Recent Labs  04/26/15 0321 04/27/15 0620  WBC 3.9* 4.3  HGB 13.0 11.7*  HCT 36.0* 33.0*  PLT 104* 58*   BMET  Recent Labs  04/26/15 0321 04/27/15 0620  NA 136 134*  K 4.0 4.4  CL 103 109  CO2 24 21*  GLUCOSE 118* 81  BUN <5* 7  CREATININE 0.92 0.94  CALCIUM 8.7* 8.0*   LFT  Recent Labs  04/27/15 0620  PROT 6.2*  ALBUMIN 2.2*  AST 60*  ALT 28  ALKPHOS 123  BILITOT 1.5*   Studies/Results: Dg Ankle Complete Right  04/26/2015   CLINICAL DATA:  Severe right ankle pain for the past 3 months. No known injury.  EXAM: RIGHT ANKLE - COMPLETE 3+ VIEW  COMPARISON:  None.  FINDINGS: Mild distal medial malleolus spur formation. Arterial calcifications and calcified phleboliths. No effusion seen.  IMPRESSION: Mild degenerative change.    Electronically Signed   By: Claudie Revering M.D.   On: 04/26/2015 15:19   Mr Thoracic Spine W Wo Contrast  04/26/2015   CLINICAL DATA:  64 year old male with numbness from the chest down for several months, progressively increasing. Generalized pain in weakness. Unintentional weight loss x3 months. Initial encounter.  EXAM: MRI THORACIC SPINE WITHOUT AND WITH CONTRAST  TECHNIQUE: Multiplanar and multiecho pulse sequences of the thoracic spine were obtained without and with intravenous contrast.  CONTRAST:  15 mL MultiHance  COMPARISON:  Chest radiograph 03/02/2015.  FINDINGS: Widespread nonspecific decreased bone marrow signal in the visible skeleton. However, no thoracic compression fracture, thoracic vertebral destructive osseous lesion or marrow edema.  Thoracic spinal canal is patent without stenosis. No abnormal intradural enhancement. Thoracic spinal cord signal appears within normal limits. The conus medullaris appears normal at the T12 level.  Negative visualized posterior paraspinal soft tissues. The thoracic inlet is reported below. Elsewhere the visualized mediastinal structures appear within normal limits. Lung parenchyma remarkable for bilateral lower lobe bronchiectasis and/or peribronchial increased signal. Negative visualized upper abdominal viscera.  At the thoracic inlet, the level of the thyroid, there is a nearly 4.5 cm segment of severe circumferential esophageal wall thickening (series 9, image 6, series 11, image 10, series 7, image 6). The wall thickening is up to 10 mm. The lumen in this region appears irregular on series 11, image 10. There is diffuse enhancement of the thickened wall. To the right of the  trachea. In the right tracheoesophageal groove there is a 14 mm T1 and T2 hyperintense nodule which might enhance, suspicious for lymphadenopathy.  IMPRESSION: 1. 4 cm segment of abnormal esophageal wall thickening at the thoracic inlet, suspicious for esophageal carcinoma. Possible  enlarged 14 mm lymph node in the right tracheoesophageal groove. Recommend endoscopy. 2. Abnormal bone marrow signal diffusely, without destructive lesion or marrow edema in the thoracic spine. Query anemia causing red marrow reactivation. Otherwise metastatic disease to bone cannot be excluded. 3. No thoracic spinal stenosis, cord compression, or thoracic spinal cord abnormality identified to explain the sensory changes.   Electronically Signed   By: Genevie Ann M.D.   On: 04/26/2015 14:33   Mr 3d Recon At Scanner  04/28/2015   CLINICAL DATA:  Pancreatitis (likely secondary to ETOH), abdominal pain, borderline dilatation of pancreatic duct  EXAM: MRI ABDOMEN WITHOUT AND WITH CONTRAST (INCLUDING MRCP)  TECHNIQUE: Multiplanar multisequence MR imaging of the abdomen was performed both before and after the administration of intravenous contrast. Heavily T2-weighted images of the biliary and pancreatic ducts were obtained, and three-dimensional MRCP images were rendered by post processing.  CONTRAST:  59mL MULTIHANCE GADOBENATE DIMEGLUMINE 529 MG/ML IV SOLN  COMPARISON:  Abdominal ultrasound dated 04/26/2015  FINDINGS: Motion degraded images.  Lower chest:  Lung bases are clear.  Hepatobiliary: Liver is within normal limits. No suspicious/enhancing hepatic lesions. No hepatic steatosis.  Gallbladder is unremarkable. No intrahepatic or extrahepatic ductal dilatation.  No cholelithiasis or choledocholithiasis is seen.  Pancreas: Peripancreatic fluid/ inflammatory changes along the pancreatic head/ uncinate process and pancreatic tail, compatible with acute pancreatitis. No drainable fluid collection/pseudocyst. No evidence of pancreatic mass. Pancreatic duct is within the upper limits of normal.  Spleen: Within normal limits.  Adrenals/Urinary Tract: Adrenal glands are within normal limits.  6 mm left upper pole renal cyst (series 8/image 30). Left kidney is otherwise within normal limits. No hydronephrosis.  Severely  atrophic right kidney (series 8/ image 33). No hydronephrosis.  Stomach/Bowel: Stomach and visualized bowel is grossly unremarkable.  Vascular/Lymphatic: No evidence of abdominal aortic aneurysm.  No suspicious abdominal lymphadenopathy.  Other: Small volume retroperitoneal/abdominal ascites.  Musculoskeletal: No focal osseous lesions.  IMPRESSION: Motion degraded images.  Acute pancreatitis.  No drainable fluid collection/pseudocyst.  No evidence of pancreatic mass. Pancreatic duct is within the upper limits of normal.  No intrahepatic or extrahepatic ductal dilatation. No cholelithiasis or choledocholithiasis is seen.  Severely atrophic right kidney.   Electronically Signed   By: Julian Hy M.D.   On: 04/28/2015 10:24   Mr Abd W/wo Cm/mrcp  04/28/2015   CLINICAL DATA:  Pancreatitis (likely secondary to ETOH), abdominal pain, borderline dilatation of pancreatic duct  EXAM: MRI ABDOMEN WITHOUT AND WITH CONTRAST (INCLUDING MRCP)  TECHNIQUE: Multiplanar multisequence MR imaging of the abdomen was performed both before and after the administration of intravenous contrast. Heavily T2-weighted images of the biliary and pancreatic ducts were obtained, and three-dimensional MRCP images were rendered by post processing.  CONTRAST:  44mL MULTIHANCE GADOBENATE DIMEGLUMINE 529 MG/ML IV SOLN  COMPARISON:  Abdominal ultrasound dated 04/26/2015  FINDINGS: Motion degraded images.  Lower chest:  Lung bases are clear.  Hepatobiliary: Liver is within normal limits. No suspicious/enhancing hepatic lesions. No hepatic steatosis.  Gallbladder is unremarkable. No intrahepatic or extrahepatic ductal dilatation.  No cholelithiasis or choledocholithiasis is seen.  Pancreas: Peripancreatic fluid/ inflammatory changes along the pancreatic head/ uncinate process and pancreatic tail, compatible with acute pancreatitis. No drainable fluid collection/pseudocyst. No evidence of pancreatic  mass. Pancreatic duct is within the upper limits  of normal.  Spleen: Within normal limits.  Adrenals/Urinary Tract: Adrenal glands are within normal limits.  6 mm left upper pole renal cyst (series 8/image 30). Left kidney is otherwise within normal limits. No hydronephrosis.  Severely atrophic right kidney (series 8/ image 33). No hydronephrosis.  Stomach/Bowel: Stomach and visualized bowel is grossly unremarkable.  Vascular/Lymphatic: No evidence of abdominal aortic aneurysm.  No suspicious abdominal lymphadenopathy.  Other: Small volume retroperitoneal/abdominal ascites.  Musculoskeletal: No focal osseous lesions.  IMPRESSION: Motion degraded images.  Acute pancreatitis.  No drainable fluid collection/pseudocyst.  No evidence of pancreatic mass. Pancreatic duct is within the upper limits of normal.  No intrahepatic or extrahepatic ductal dilatation. No cholelithiasis or choledocholithiasis is seen.  Severely atrophic right kidney.   Electronically Signed   By: Julian Hy M.D.   On: 04/28/2015 10:24   Medications: I have reviewed the patient's current medications.  Assessment/Plan: 1) Dysphagia with an esophageal mass ?SSC at 20 cm-biopsies pending. Continue supportive care. 2) Mild alcoholic pancreatitis.  3) History of Hepatitis C AND alcoholic cirrhosis. 4) Short segment Barrett's esophagus.   LOS: 2 days   MANN,JYOTHI 04/28/2015, 11:46 AM

## 2015-04-28 NOTE — Progress Notes (Signed)
Patient Demographics:    Patrick [name], is a 64 y.o. male, DOB - [birth year], IRJ:[id number]  Admit date - 04/26/2015   Admitting Physician Ivor Costa, MD  Outpatient Primary MD for the patient is Pcp Not In System  LOS - 2   Chief Complaint  Patient presents with  . Numbness        Subjective:    Patrick [name] today has, No headache, No chest pain, No abdominal pain - No Nausea, No new weakness tingling or numbness, No Cough - SOB. He complains of generalized weakness and numbness below his chest.   Assessment  & Plan :     1. Chronic numbness starting from anterior chest all the way down to both feet but perceives it anteriorly, ongoing for several months. Patient claims that he has a pending appointment with [facility] neurology on 05/16/2015. He denies any back pain. Does not have any objective weakness. No signs of cauda equina. B-12 is borderline, TSH stable, RPR, HIV &  Uric acid Stable. ESR is elevated and CT shows possible Esophageal Mass with possible T spine Met but no cord compression or bony destruction, discussed his case in detail with neurologist Dr. Doy Mince, Continue PT and ASA, Supp rx. No further workup with outpatient follow-up with [facility] neurology as scheduled before.   2. Pain in both feet. Likely neuropathy. Uric acid stable. Ankle x-ray stable. Supportive care with PT. Outpatient neurology follow-up.   3. History of alcoholic cirrhosis. Consumes large amounts of alcohol on a daily basis. Counseled to quit. Slightly confused this morning but doubt he is in DTs, reduce Librium continue CIWA protocol. Counseled to quit alcohol.   4. Essential hypertension. Blood pressure medications adjusted, confirmed with patient not been using cocaine for the last 3 months.    5.  Dyslipidemia on statin continue.   6. Possible mild pancreatitis. Likely alcohol induced. Bowel rest, IV fluids, pain control, lipid panel shows triglycerides of 96, ultrasound abdomen shows CBD at 4 mm. AST elevated in the pattern of alcohol abuse with improving trend of liver enzymes. Supportive care and monitor. Per GI MRCP has been ordered.   7. Esophageal Mass - GI called. Underwent EGD suggestive of circumferential esophageal mass, biopsies obtained and pending.    Code Status : Full  Family Communication  : None present  Disposition Plan  : Remain inpatient next 1-2 days  Consults  :  GI  Procedures  :   CT head unremarkable  MRI T-spine showing esophageal mass with nonspecific spine changes without any bony distraction of cord compression.  EGD showing circumferential esophageal mass. Biopsies obtained  MRCP pending  DVT Prophylaxis  :    SCD  Lab Results  Component Value Date   PLT 58* 04/27/2015    Inpatient Medications  Scheduled Meds: . aspirin  81 mg Oral Daily  . atorvastatin  40 mg Oral Daily  . chlordiazePOXIDE  5 mg Oral TID  . feeding supplement  1 Container Oral TID BM  . folic acid  1 mg Oral Daily  . gabapentin  300 mg Oral QHS  . lisinopril  20 mg Oral Daily  . metoprolol tartrate  50 mg Oral BID  . multivitamin with minerals  1 tablet Oral Once  .  multivitamin with minerals  1 tablet Oral Daily  . nicotine  21 mg Transdermal Daily  . pantoprazole (PROTONIX) IV  40 mg Intravenous Q24H  . sodium chloride  3 mL Intravenous Q12H  . thiamine  100 mg Oral Daily   Or  . thiamine  100 mg Intravenous Daily   Continuous Infusions: . sodium chloride 500 mL (04/27/15 1341)  . lactated ringers     PRN Meds:.cloNIDine, hydrALAZINE, hydrOXYzine, LORazepam **OR** LORazepam, morphine injection, nitroGLYCERIN, polyethylene glycol, traMADol, triamcinolone cream  Antibiotics  :     Anti-infectives    None        Objective:   Filed Vitals:    04/27/15 2226 04/28/15 0119 04/28/15 0553 04/28/15 0610  BP: 145/84 145/84 121/80 121/75  Pulse: 89 97 103 92  Temp: 98.9 F (37.2 C)  99.1 F (37.3 C)   TempSrc: Oral  Oral   Resp: 18  18   Height:      Weight:   67.1 kg (147 lb 14.9 oz)   SpO2: 95%  94%     Wt Readings from Last 3 Encounters:  04/28/15 67.1 kg (147 lb 14.9 oz)  04/02/15 72.576 kg (160 lb)  03/20/15 73.437 kg (161 lb 14.4 oz)     Intake/Output Summary (Last 24 hours) at 04/28/15 0955 Last data filed at 04/28/15 0933  Gross per 24 hour  Intake   1473 ml  Output    100 ml  Net   1373 ml     Physical Exam  Awake Alert, Oriented X 3, No new F.N deficits, Normal affect [place].AT,PERRAL Supple Neck,No JVD, No cervical lymphadenopathy appriciated.  Symmetrical Chest wall movement, Good air movement bilaterally, CTAB RRR,No Gallops,Rubs or new Murmurs, No Parasternal Heave +ve B.Sounds, Abd Soft, No tenderness, No organomegaly appriciated, No rebound - guarding or rigidity. No Cyanosis, Clubbing or edema, No new Rash or bruise       Data Review:   Micro Results Recent Results (from the past 240 hour(s))  Culture, blood (routine x 2)     Status: None (Preliminary result)   Collection Time: 04/26/15  3:22 PM  Result Value Ref Range Status   Specimen Description BLOOD RIGHT HAND  Final   Special Requests BOTTLES DRAWN AEROBIC AND ANAEROBIC 5CC  Final   Culture NO GROWTH < 24 HOURS  Final   Report Status PENDING  Incomplete  Culture, blood (routine x 2)     Status: None (Preliminary result)   Collection Time: 04/26/15  3:28 PM  Result Value Ref Range Status   Specimen Description BLOOD LEFT ARM  Final   Special Requests BOTTLES DRAWN AEROBIC AND ANAEROBIC 5CC  Final   Culture NO GROWTH < 24 HOURS  Final   Report Status PENDING  Incomplete    Radiology Reports Dg Ankle Complete Right  04/26/2015   CLINICAL DATA:  Severe right ankle pain for the past 3 months. No known injury.  EXAM: RIGHT ANKLE -  COMPLETE 3+ VIEW  COMPARISON:  None.  FINDINGS: Mild distal medial malleolus spur formation. Arterial calcifications and calcified phleboliths. No effusion seen.  IMPRESSION: Mild degenerative change.   Electronically Signed   By: Claudie Revering M.D.   On: 04/26/2015 15:19   Ct Head Wo Contrast  04/26/2015   CLINICAL DATA:  Initial evaluation for left lower extremity numbness.  EXAM: CT HEAD WITHOUT CONTRAST  TECHNIQUE: Contiguous axial images were obtained from the base of the skull through the vertex without intravenous contrast.  COMPARISON:  Prior study from 11/16/2010.  FINDINGS: There is no acute intracranial hemorrhage or infarct. No mass lesion or midline shift. Gray-white matter differentiation is well maintained. Ventricles are normal in size without evidence of hydrocephalus. CSF containing spaces are within normal limits. No extra-axial fluid collection. Generalized cerebral atrophy with mild chronic microvascular ischemic disease present. Scattered vascular calcifications within the carotid siphons.  The calvarium is intact.  Orbital soft tissues are within normal limits.  The paranasal sinuses and mastoid air cells are well pneumatized and free of fluid.  Scalp soft tissues are unremarkable.  IMPRESSION: 1. No acute intracranial process. 2. Generalized age-related cerebral atrophy with chronic microvascular ischemic disease.   Electronically Signed   By: Jeannine Boga M.D.   On: 04/26/2015 04:19   Mr Thoracic Spine W Wo Contrast  04/26/2015   CLINICAL DATA:  64 year old male with numbness from the chest down for several months, progressively increasing. Generalized pain in weakness. Unintentional weight loss x3 months. Initial encounter.  EXAM: MRI THORACIC SPINE WITHOUT AND WITH CONTRAST  TECHNIQUE: Multiplanar and multiecho pulse sequences of the thoracic spine were obtained without and with intravenous contrast.  CONTRAST:  15 mL MultiHance  COMPARISON:  Chest radiograph 03/02/2015.   FINDINGS: Widespread nonspecific decreased bone marrow signal in the visible skeleton. However, no thoracic compression fracture, thoracic vertebral destructive osseous lesion or marrow edema.  Thoracic spinal canal is patent without stenosis. No abnormal intradural enhancement. Thoracic spinal cord signal appears within normal limits. The conus medullaris appears normal at the T12 level.  Negative visualized posterior paraspinal soft tissues. The thoracic inlet is reported below. Elsewhere the visualized mediastinal structures appear within normal limits. Lung parenchyma remarkable for bilateral lower lobe bronchiectasis and/or peribronchial increased signal. Negative visualized upper abdominal viscera.  At the thoracic inlet, the level of the thyroid, there is a nearly 4.5 cm segment of severe circumferential esophageal wall thickening (series 9, image 6, series 11, image 10, series 7, image 6). The wall thickening is up to 10 mm. The lumen in this region appears irregular on series 11, image 10. There is diffuse enhancement of the thickened wall. To the right of the trachea. In the right tracheoesophageal groove there is a 14 mm T1 and T2 hyperintense nodule which might enhance, suspicious for lymphadenopathy.  IMPRESSION: 1. 4 cm segment of abnormal esophageal wall thickening at the thoracic inlet, suspicious for esophageal carcinoma. Possible enlarged 14 mm lymph node in the right tracheoesophageal groove. Recommend endoscopy. 2. Abnormal bone marrow signal diffusely, without destructive lesion or marrow edema in the thoracic spine. Query anemia causing red marrow reactivation. Otherwise metastatic disease to bone cannot be excluded. 3. No thoracic spinal stenosis, cord compression, or thoracic spinal cord abnormality identified to explain the sensory changes.   Electronically Signed   By: Genevie Ann M.D.   On: 04/26/2015 14:33   US Abdomen Complete  04/26/2015   CLINICAL DATA:  Abdominal pain for several  months.  Pancreatitis.  EXAM: ULTRASOUND ABDOMEN COMPLETE  COMPARISON:  None.  FINDINGS: Gallbladder: No gallstones or wall thickening visualized. Sonographic Murphy sign indeterminate due to pain medication.  Common bile duct: Diameter: 4 mm  Liver: No focal lesion identified. Coarse echogenic liver with poor sonic penetration compatible with diffuse hepatic steatosis.  IVC: No abnormality visualized.  Pancreas: Borderline dilatation of the dorsal pancreatic duct at 3 mm. Pancreatic head poorly seen.  Spleen: Size and appearance within normal limits.  Right Kidney: Length: 6.0 cm. Severely atrophic/dysplastic right kidney,  with several associated calcifications.  Left Kidney: Length: 13.3 cm. Echogenicity within normal limits. No mass or hydronephrosis visualized.  Abdominal aorta: No aneurysm visualized. Marginal atherosclerotic calcification.  Other findings: None.  IMPRESSION: 1. Borderline dilatation of the dorsal pancreatic duct. The pancreatic head is poorly seen. Pancreatic body and tail appear otherwise unremarkable. No pseudocyst identified. 2. Severely atrophic/dysplastic right kidney. Compensatory hypertrophy of the left kidney. 3. Coarse echogenic liver with poor sonic penetration compatible with diffuse hepatic steatosis.   Electronically Signed   By: Van Clines M.D.   On: 04/26/2015 08:43     CBC  Recent Labs Lab 04/26/15 0321 04/27/15 0620  WBC 3.9* 4.3  HGB 13.0 11.7*  HCT 36.0* 33.0*  PLT 104* 58*  MCV 100.0 104.8*  MCH 36.1* 37.1*  MCHC 36.1* 35.5  RDW 17.2* 18.0*  LYMPHSABS 1.7  --   MONOABS 0.4  --   EOSABS 0.0  --   BASOSABS 0.0  --     Chemistries   Recent Labs Lab 04/26/15 0321 04/27/15 0620  NA 136 134*  K 4.0 4.4  CL 103 109  CO2 24 21*  GLUCOSE 118* 81  BUN <5* 7  CREATININE 0.92 0.94  CALCIUM 8.7* 8.0*  AST 104* 60*  ALT 43 28  ALKPHOS 151* 123  BILITOT 0.7 1.5*    ------------------------------------------------------------------------------------------------------------------ estimated creatinine clearance is 75.3 mL/min (by C-G formula based on Cr of 0.94). ------------------------------------------------------------------------------------------------------------------ No results for input(s): HGBA1C in the last 72 hours. ------------------------------------------------------------------------------------------------------------------  Recent Labs  04/26/15 0714  CHOL 146  HDL 29*  LDLCALC 98  TRIG 96  CHOLHDL 5.0   ------------------------------------------------------------------------------------------------------------------  Recent Labs  04/26/15 0714  TSH 1.498   ------------------------------------------------------------------------------------------------------------------  Recent Labs  04/26/15 0714  VITAMINB12 399    Coagulation profile No results for input(s): INR, PROTIME in the last 168 hours.  No results for input(s): DDIMER in the last 72 hours.  Cardiac Enzymes  Recent Labs Lab 04/26/15 0714 04/26/15 1522 04/26/15 1945  TROPONINI 0.03 0.07* 0.06*   ------------------------------------------------------------------------------------------------------------------ Invalid input(s): POCBNP   Time Spent in minutes   35   SINGH,PRASHANT K M.D on 04/28/2015 at 9:55 AM  Between 7am to 7pm - Pager - [phone]  After 7pm go to [email or web address] - password [facility]  [facility] -  Office  [phone]

## 2015-04-28 NOTE — Progress Notes (Signed)
Physical Therapy Treatment Patient Details Name: Patrick [name] MRN: [id number] DOB: [birth year] Today's Date: 04/28/2015    History of Present Illness Patrick [name] is a 64 y.o. male with hx of MI, HTN, HLD, CAD, chronic chest pain, SSBE, thrombocytopenia, drug abuse, vasculitis, Raynaud's syndrome who presents with bilateral LE weakness, numbness from chest down and pain, recent weight loss with esophogeal stricture/dilitation and now with fall at home.  MRI thoracic spine showed possible mass suspicious for esophageal carcinoma (underwent EGD with biopsy 9/23), and diffuse abnormal signal throughout thoracic spine - metastatic bone disease cannot be excluded, but no definitive tumor, cord compression, or cord abnormality seen.     PT Comments    Increased assist required today due to lethargy. Recommended d/c plan continues to be for [facility] SN[facility]; however, pt is refusing. He is at very high risk for falls, if he returns home. If he continues to decline, he will need HHPT and RW for home use.  Follow Up Recommendations  SNF     Equipment Recommendations  Rolling walker with 5" wheels    Recommendations for Other Services       Precautions / Restrictions Precautions Precautions: Fall Precaution Comments: Knees buckle Restrictions Weight Bearing Restrictions: No    Mobility  Bed Mobility         Supine to sit: Min assist     General bed mobility comments: verbal cues for sequencing  Transfers   Equipment used: Rolling walker (2 wheeled)   Sit to Stand: Min assist Stand pivot transfers: Min assist       General transfer comment: verbal cues for hand placement and sequencing  Ambulation/Gait Ambulation/Gait assistance: Min assist Ambulation Distance (Feet): 50 Feet Assistive device: Rolling walker (2 wheeled) Gait Pattern/deviations: Step-through pattern;Decreased stride length Gait velocity: decreased Gait velocity interpretation: Below normal speed for  age/gender General Gait Details: verbal cues for RW management. Pt tends to push it too far out in front.   Stairs            Wheelchair Mobility    Modified Rankin (Stroke Patients Only)       Balance                                    Cognition Arousal/Alertness: Lethargic Behavior During Therapy: Flat affect Overall Cognitive Status: Within Functional Limits for tasks assessed                      Exercises      General Comments        Pertinent Vitals/Pain Pain Assessment: No/denies pain    Home Living                      Prior Function            PT Goals (current goals can now be found in the care plan section) Acute Rehab PT Goals Patient Stated Goal: to go back home  PT Goal Formulation: With patient Time For Goal Achievement: 05/10/15 Potential to Achieve Goals: Good Progress towards PT goals: Progressing toward goals    Frequency  Min 3X/week    PT Plan Current plan remains appropriate    Co-evaluation             End of Session Equipment Utilized During Treatment: Gait belt Activity Tolerance: Patient limited by lethargy Patient left: in chair;with call bell/phone within reach;with  chair alarm set     Time: 1840-3754 PT Time Calculation (min) (ACUTE ONLY): 23 min  Charges:  $Gait Training: 23-37 mins                    G Codes:      Patrick [name] 04/28/2015, 11:31 AM

## 2015-04-28 NOTE — Care Management Note (Signed)
Case Management Note  Patient Details  Name: Patrick [name] MRN: [id number] Date of Birth: [birth year]  Subjective/Objective:                   Numbness, abdominal pain, fall, ankle swelling, weight loss, pain all over Action/Plan: Discharge planning  Expected Discharge Date:  04/29/15               Expected Discharge Plan:  [place]  In-House Referral:  Clinical Social Work  Discharge planning Services  CM Consult  Post Acute Care Choice:  Durable Medical Equipment, Home Health Choice offered to:  Patient  DME Arranged:  Walker rolling, Shower stool DME Agency:  [place]:  PT, OT, RN [place] Agency:  [place]  Status of Service:  Completed, signed off  Medicare Important Message Given:    Date Medicare IM Given:    Medicare IM give by:    Date Additional Medicare IM Given:    Additional Medicare Important Message give by:     If discussed at [place] of Stay Meetings, dates discussed:    Additional Comments: CM notes orders and F2F placed and called to confirm referral with [facility] rep, Stephanie.  CM called [facility] DME rep, Merry Proud to please deliver the rolling walker to room prior to tomorrow's discharge.  No other CM needs were communicated.  Dellie Catholic, RN 04/28/2015, 4:01 PM

## 2015-04-29 LAB — COMPREHENSIVE METABOLIC PANEL
ALBUMIN: 2 g/dL — AB (ref 3.5–5.0)
ALBUMIN: 2 g/dL — AB (ref 3.5–5.0)
ALK PHOS: 112 U/L (ref 38–126)
ALT: 24 U/L (ref 17–63)
ALT: 25 U/L (ref 17–63)
ANION GAP: 5 (ref 5–15)
AST: 68 U/L — AB (ref 15–41)
AST: 77 U/L — AB (ref 15–41)
Alkaline Phosphatase: 112 U/L (ref 38–126)
Anion gap: 6 (ref 5–15)
CALCIUM: 8.1 mg/dL — AB (ref 8.9–10.3)
CHLORIDE: 102 mmol/L (ref 101–111)
CO2: 23 mmol/L (ref 22–32)
CO2: 22 mmol/L (ref 22–32)
CREATININE: 0.76 mg/dL (ref 0.61–1.24)
Calcium: 8.1 mg/dL — ABNORMAL LOW (ref 8.9–10.3)
Chloride: 101 mmol/L (ref 101–111)
Creatinine, Ser: 0.72 mg/dL (ref 0.61–1.24)
GFR calc Af Amer: >60 mL/min (ref >60)
GFR calc Af Amer: >60 mL/min (ref >60)
GFR calc non Af Amer: >60 mL/min (ref >60)
GFR calc non Af Amer: >60 mL/min (ref >60)
GLUCOSE: 79 mg/dL (ref 65–99)
GLUCOSE: 88 mg/dL (ref 65–99)
POTASSIUM: 3.3 mmol/L — AB (ref 3.5–5.1)
Potassium: 3.5 mmol/L (ref 3.5–5.1)
SODIUM: 130 mmol/L — AB (ref 135–145)
SODIUM: 129 mmol/L — AB (ref 135–145)
Total Bilirubin: 1.4 mg/dL — ABNORMAL HIGH (ref 0.3–1.2)
Total Bilirubin: 1.3 mg/dL — ABNORMAL HIGH (ref 0.3–1.2)
Total Protein: 5.9 g/dL — ABNORMAL LOW (ref 6.5–8.1)
Total Protein: 6.2 g/dL — ABNORMAL LOW (ref 6.5–8.1)

## 2015-04-29 LAB — MAGNESIUM: Magnesium: 1.1 mg/dL — ABNORMAL LOW (ref 1.7–2.4)

## 2015-04-29 LAB — CBC
HEMATOCRIT: 30.4 % — AB (ref 39.0–52.0)
Hemoglobin: 10.7 g/dL — ABNORMAL LOW (ref 13.0–17.0)
MCH: 35.7 pg — ABNORMAL HIGH (ref 26.0–34.0)
MCHC: 35.2 g/dL (ref 30.0–36.0)
MCV: 101.3 fL — ABNORMAL HIGH (ref 78.0–100.0)
PLATELETS: 49 10*3/uL — AB (ref 150–400)
RBC: 3 MIL/uL — ABNORMAL LOW (ref 4.22–5.81)
RDW: 17.4 % — AB (ref 11.5–15.5)
WBC: 3.3 10*3/uL — AB (ref 4.0–10.5)

## 2015-04-29 LAB — GLUCOSE, CAPILLARY: Glucose-Capillary: 94 mg/dL (ref 65–99)

## 2015-04-29 MED ORDER — LACTATED RINGERS IV SOLN
INTRAVENOUS | Status: AC
  Administered 2015-04-29: 13:00:00 via INTRAVENOUS

## 2015-04-29 MED ORDER — POTASSIUM CHLORIDE CRYS ER 20 MEQ PO TBCR
40.0000 meq | EXTENDED_RELEASE_TABLET | ORAL | Status: AC
  Administered 2015-04-29 (×2): 40 meq via ORAL
  Filled 2015-04-29 (×2): qty 2

## 2015-04-29 NOTE — Progress Notes (Signed)
UNASSIGNED PATIENT Cross cover for [facility] Subjective: Since I last evaluated the patient, he seems to be more interactive today. Continues to have a abdomina pain; denies having any nausea. Wants to see a Education officer, museum due to a "situatiion at home".   Objective: Vital signs in last 24 hours: Temp:  [98 F (36.7 C)-98.9 F (37.2 C)] 98.9 F (37.2 C) (09/25 0505) Pulse Rate:  [85-96] 89 (09/25 0621) Resp:  [16] 16 (09/25 0505) BP: (122-158)/(83-99) 153/90 mmHg (09/25 0621) SpO2:  [97 %-98 %] 97 % (09/25 0505) Weight:  [74.118 kg (163 lb 6.4 oz)] 74.118 kg (163 lb 6.4 oz) (09/25 0505) Last BM Date: 04/24/15  Intake/Output from previous day: 09/24 0701 - 09/25 0700 In: 935 [P.O.:180; I.V.:755] Out: 600 [Urine:600] Intake/Output this shift: Total I/O In: 1041.3 [P.O.:200; I.V.:841.3] Out: -   General appearance: alert, cooperative, appears stated age, fatigued and mild distress Resp: clear to auscultation bilaterally Cardio: regular rate and rhythm, S1, S2 normal, no murmur, click, rub or gallop GI: soft, slightly distended, mild diffuse tenderness on palpation, with hypoactive bowel sounds normal; no masses,  no organomegaly;   Lab Results:  Recent Labs  04/27/15 0620 04/29/15 0620  WBC 4.3 3.3*  HGB 11.7* 10.7*  HCT 33.0* 30.4*  PLT 58* 49*   BMET  Recent Labs  04/27/15 0620 04/29/15 0620  NA 134* 130*  K 4.4 3.3*  CL 109 102  CO2 21* 23  GLUCOSE 81 79  BUN 7 <5*  CREATININE 0.94 0.72  CALCIUM 8.0* 8.1*   LFT  Recent Labs  04/29/15 0620  PROT 5.9*  ALBUMIN 2.0*  AST 68*  ALT 24  ALKPHOS 112  BILITOT 1.4*   Studies/Results: Mr 3d Recon At Scanner  04/28/2015   CLINICAL DATA:  Pancreatitis (likely secondary to ETOH), abdominal pain, borderline dilatation of pancreatic duct  EXAM: MRI ABDOMEN WITHOUT AND WITH CONTRAST (INCLUDING MRCP)  TECHNIQUE: Multiplanar multisequence MR imaging of the abdomen was performed both before and after the administration  of intravenous contrast. Heavily T2-weighted images of the biliary and pancreatic ducts were obtained, and three-dimensional MRCP images were rendered by post processing.  CONTRAST:  57mL MULTIHANCE GADOBENATE DIMEGLUMINE 529 MG/ML IV SOLN  COMPARISON:  Abdominal ultrasound dated 04/26/2015  FINDINGS: Motion degraded images.  Lower chest:  Lung bases are clear.  Hepatobiliary: Liver is within normal limits. No suspicious/enhancing hepatic lesions. No hepatic steatosis.  Gallbladder is unremarkable. No intrahepatic or extrahepatic ductal dilatation.  No cholelithiasis or choledocholithiasis is seen.  Pancreas: Peripancreatic fluid/ inflammatory changes along the pancreatic head/ uncinate process and pancreatic tail, compatible with acute pancreatitis. No drainable fluid collection/pseudocyst. No evidence of pancreatic mass. Pancreatic duct is within the upper limits of normal.  Spleen: Within normal limits.  Adrenals/Urinary Tract: Adrenal glands are within normal limits.  6 mm left upper pole renal cyst (series 8/image 30). Left kidney is otherwise within normal limits. No hydronephrosis.  Severely atrophic right kidney (series 8/ image 33). No hydronephrosis.  Stomach/Bowel: Stomach and visualized bowel is grossly unremarkable.  Vascular/Lymphatic: No evidence of abdominal aortic aneurysm.  No suspicious abdominal lymphadenopathy.  Other: Small volume retroperitoneal/abdominal ascites.  Musculoskeletal: No focal osseous lesions.  IMPRESSION: Motion degraded images.  Acute pancreatitis.  No drainable fluid collection/pseudocyst.  No evidence of pancreatic mass. Pancreatic duct is within the upper limits of normal.  No intrahepatic or extrahepatic ductal dilatation. No cholelithiasis or choledocholithiasis is seen.  Severely atrophic right kidney.   Electronically Signed   By:  Julian Hy M.D.   On: 04/28/2015 10:24   Mr Abd W/wo Cm/mrcp  04/28/2015   CLINICAL DATA:  Pancreatitis (likely secondary to ETOH),  abdominal pain, borderline dilatation of pancreatic duct  EXAM: MRI ABDOMEN WITHOUT AND WITH CONTRAST (INCLUDING MRCP)  TECHNIQUE: Multiplanar multisequence MR imaging of the abdomen was performed both before and after the administration of intravenous contrast. Heavily T2-weighted images of the biliary and pancreatic ducts were obtained, and three-dimensional MRCP images were rendered by post processing.  CONTRAST:  26mL MULTIHANCE GADOBENATE DIMEGLUMINE 529 MG/ML IV SOLN  COMPARISON:  Abdominal ultrasound dated 04/26/2015  FINDINGS: Motion degraded images.  Lower chest:  Lung bases are clear.  Hepatobiliary: Liver is within normal limits. No suspicious/enhancing hepatic lesions. No hepatic steatosis.  Gallbladder is unremarkable. No intrahepatic or extrahepatic ductal dilatation.  No cholelithiasis or choledocholithiasis is seen.  Pancreas: Peripancreatic fluid/ inflammatory changes along the pancreatic head/ uncinate process and pancreatic tail, compatible with acute pancreatitis. No drainable fluid collection/pseudocyst. No evidence of pancreatic mass. Pancreatic duct is within the upper limits of normal.  Spleen: Within normal limits.  Adrenals/Urinary Tract: Adrenal glands are within normal limits.  6 mm left upper pole renal cyst (series 8/image 30). Left kidney is otherwise within normal limits. No hydronephrosis.  Severely atrophic right kidney (series 8/ image 33). No hydronephrosis.  Stomach/Bowel: Stomach and visualized bowel is grossly unremarkable.  Vascular/Lymphatic: No evidence of abdominal aortic aneurysm.  No suspicious abdominal lymphadenopathy.  Other: Small volume retroperitoneal/abdominal ascites.  Musculoskeletal: No focal osseous lesions.  IMPRESSION: Motion degraded images.  Acute pancreatitis.  No drainable fluid collection/pseudocyst.  No evidence of pancreatic mass. Pancreatic duct is within the upper limits of normal.  No intrahepatic or extrahepatic ductal dilatation. No cholelithiasis  or choledocholithiasis is seen.  Severely atrophic right kidney.   Electronically Signed   By: Julian Hy M.D.   On: 04/28/2015 10:24   Medications: I have reviewed the patient's current medications.  Assessment/Plan: 1) Esophageal mass at 20 cm [biopsies pending] with alcoholic cirrhosis: Await path. Continue present care.  2) Chronic Hepatitis C.  3) PCM-abnormal weight loss.  4) Mild pancreatitis from ETOH-continue present care.  LOS: 3 days   MANN,JYOTHI 04/29/2015, 11:50 AM

## 2015-04-29 NOTE — Progress Notes (Addendum)
Patient Demographics:    Patrick [name], is a 64 y.o. male, DOB - 07-15-1951, OAC:[id number]  Admit date - 04/26/2015   Admitting Physician Ivor Costa, MD  Outpatient Primary MD for the patient is Pcp Not In System  LOS - 3   Chief Complaint  Patient presents with  . Numbness        Subjective:    Patrick [name] today has, No headache, No chest pain, No abdominal pain - No Nausea, No new weakness tingling or numbness, No Cough - SOB. He complains of generalized weakness and numbness below his chest.   Assessment  & Plan :     1. Chronic numbness starting from anterior chest all the way down to both feet but perceives it anteriorly, ongoing for several months. Patient claims that he has a pending appointment with [facility] neurology on 05/16/2015. He denies any back pain. Does not have any objective weakness. No signs of cauda equina. B-12 is borderline, TSH stable, RPR, HIV &  Uric acid Stable.   ESR is elevated and CT shows possible Esophageal Mass with possible T spine Met but no cord compression or bony destruction, discussed his case in detail with neurologist Dr. Doy Mince, Continue PT and ASA, Supp rx. No further workup with outpatient follow-up with [facility] neurology as scheduled before.   2. Pain in both feet. Likely neuropathy. Uric acid stable. Ankle x-ray stable. Supportive care with PT. Outpatient neurology follow-up.   3. History of alcoholic cirrhosis. Consumes large amounts of alcohol on a daily basis. Counseled to quit. Slightly confused this morning but doubt he is in DTs, reduce Librium continue CIWA protocol. Counseled to quit alcohol.   4. Essential hypertension. Blood pressure medications adjusted, confirmed with patient not been using cocaine for the last 3 months.    5.  Dyslipidemia on statin continue.   6. Possible mild pancreatitis. Likely alcohol induced. Bowel rest, IV fluids, pain control, lipid panel shows triglycerides of 96, ultrasound abdomen shows CBD at 4 mm. AST elevated in the pattern of alcohol abuse with improving trend of liver enzymes. Supportive care and monitor. GI onboard ERCP also confirms pancreatitis but no other acute abnormality.   7. Esophageal Mass - GI called. Underwent EGD suggestive of circumferential esophageal mass, biopsies obtained and pending.   8. Chronic diastolic dysfunction. EF 55-60%. Wall motion abnormality noted on echogram. Troponin borderline in non-ACS pattern, he is not a admitted currently for invasive tests or procedures. On aspirin, statin, ACE inhibitor and beta blocker which will be continued for secondary prevention. No chest pain.    Code Status : Full  Family Communication  : None present  Disposition Plan  : Remain inpatient next 1-2 days  Consults  :  GI  Procedures  :   CT head unremarkable  MRI T-spine showing esophageal mass with nonspecific spine changes without any bony distraction of cord compression.  EGD showing circumferential esophageal mass. Biopsies obtained  MRCP shows pancreatitis.  Echogram  - Left ventricle: There is hypokinesis of the basal inferior andinferolateral walls. The cavity size was normal. There was mildconcentric hypertrophy. Systolic function was normal. Theestimated ejection fraction was in the range of 55% to 60%. Wallmotion was normal; there were no regional wall motionabnormalities. Doppler parameters  are consistent with abnormalleft ventricular relaxation (grade 1 diastolic dysfunction). There was no evidence of elevated ventricular filling pressure byDoppler parameters. - Aortic valve: Trileaflet; normal thickness leaflets. There wasmild regurgitation. - Aortic root: The aortic root was normal in size. - Mitral valve: Mildly thickened leaflets  . - Right ventricle: The cavity size was normal. Wall thickness wasnormal. Systolic function was normal. - Right atrium: The atrium was normal in size. - Tricuspid valve: There was trivial regurgitation. - Pulmonary arteries: Systolic pressure was within the normalrange. - Inferior vena cava: The vessel was normal in size. - Pericardium, extracardiac: There was no pericardial effusion.  DVT Prophylaxis  :    SCD  Lab Results  Component Value Date   PLT 49* 04/29/2015    Inpatient Medications  Scheduled Meds: . aspirin  81 mg Oral Daily  . atorvastatin  40 mg Oral Daily  . chlordiazePOXIDE  5 mg Oral TID  . feeding supplement  1 Container Oral TID BM  . folic acid  1 mg Oral Daily  . gabapentin  300 mg Oral QHS  . lisinopril  20 mg Oral Daily  . metoprolol tartrate  50 mg Oral BID  . multivitamin with minerals  1 tablet Oral Once  . multivitamin with minerals  1 tablet Oral Daily  . nicotine  21 mg Transdermal Daily  . pantoprazole (PROTONIX) IV  40 mg Intravenous Q24H  . sodium chloride  3 mL Intravenous Q12H  . thiamine  100 mg Oral Daily   Or  . thiamine  100 mg Intravenous Daily   Continuous Infusions: . sodium chloride 20 mL/hr at 04/28/15 1134   PRN Meds:.cloNIDine, hydrALAZINE, hydrOXYzine, morphine injection, nitroGLYCERIN, polyethylene glycol, traMADol, triamcinolone cream  Antibiotics  :     Anti-infectives    None        Objective:   Filed Vitals:   04/28/15 2057 04/29/15 0049 04/29/15 0505 04/29/15 0621  BP: 158/89 149/99 138/96 153/90  Pulse: 89 96 85 89  Temp: 98.6 F (37 C)  98.9 F (37.2 C)   TempSrc: Oral  Oral   Resp: 16  16   Height:      Weight:   74.118 kg (163 lb 6.4 oz)   SpO2: 98%  97%     Wt Readings from Last 3 Encounters:  04/29/15 74.118 kg (163 lb 6.4 oz)  04/02/15 72.576 kg (160 lb)  03/20/15 73.437 kg (161 lb 14.4 oz)     Intake/Output Summary (Last 24 hours) at 04/29/15 1205 Last data filed at 04/29/15 0857   Gross per 24 hour  Intake 1161.33 ml  Output    500 ml  Net 661.33 ml     Physical Exam  Awake Alert, Oriented X 3, No new F.N deficits, Normal affect .AT,PERRAL Supple Neck,No JVD, No cervical lymphadenopathy appriciated.  Symmetrical Chest wall movement, Good air movement bilaterally, CTAB RRR,No Gallops,Rubs or new Murmurs, No Parasternal Heave +ve B.Sounds, Abd Soft, No tenderness, No organomegaly appriciated, No rebound - guarding or rigidity. No Cyanosis, Clubbing or edema, No new Rash or bruise       Data Review:   Micro Results Recent Results (from the past 240 hour(s))  Culture, blood (routine x 2)     Status: None (Preliminary result)   Collection Time: 04/26/15  3:22 PM  Result Value Ref Range Status   Specimen Description BLOOD RIGHT HAND  Final   Special Requests BOTTLES DRAWN AEROBIC AND ANAEROBIC 5CC  Final   Culture NO  GROWTH 2 DAYS  Final   Report Status PENDING  Incomplete  Culture, blood (routine x 2)     Status: None (Preliminary result)   Collection Time: 04/26/15  3:28 PM  Result Value Ref Range Status   Specimen Description BLOOD LEFT ARM  Final   Special Requests BOTTLES DRAWN AEROBIC AND ANAEROBIC 5CC  Final   Culture NO GROWTH 2 DAYS  Final   Report Status PENDING  Incomplete    Radiology Reports Dg Ankle Complete Right  04/26/2015   CLINICAL DATA:  Severe right ankle pain for the past 3 months. No known injury.  EXAM: RIGHT ANKLE - COMPLETE 3+ VIEW  COMPARISON:  None.  FINDINGS: Mild distal medial malleolus spur formation. Arterial calcifications and calcified phleboliths. No effusion seen.  IMPRESSION: Mild degenerative change.   Electronically Signed   By: Claudie Revering M.D.   On: 04/26/2015 15:19   Ct Head Wo Contrast  04/26/2015   CLINICAL DATA:  Initial evaluation for left lower extremity numbness.  EXAM: CT HEAD WITHOUT CONTRAST  TECHNIQUE: Contiguous axial images were obtained from the base of the skull through the vertex without  intravenous contrast.  COMPARISON:  Prior study from 11/16/2010.  FINDINGS: There is no acute intracranial hemorrhage or infarct. No mass lesion or midline shift. Gray-white matter differentiation is well maintained. Ventricles are normal in size without evidence of hydrocephalus. CSF containing spaces are within normal limits. No extra-axial fluid collection. Generalized cerebral atrophy with mild chronic microvascular ischemic disease present. Scattered vascular calcifications within the carotid siphons.  The calvarium is intact.  Orbital soft tissues are within normal limits.  The paranasal sinuses and mastoid air cells are well pneumatized and free of fluid.  Scalp soft tissues are unremarkable.  IMPRESSION: 1. No acute intracranial process. 2. Generalized age-related cerebral atrophy with chronic microvascular ischemic disease.   Electronically Signed   By: Jeannine Boga M.D.   On: 04/26/2015 04:19   Mr Thoracic Spine W Wo Contrast  04/26/2015   CLINICAL DATA:  64 year old male with numbness from the chest down for several months, progressively increasing. Generalized pain in weakness. Unintentional weight loss x3 months. Initial encounter.  EXAM: MRI THORACIC SPINE WITHOUT AND WITH CONTRAST  TECHNIQUE: Multiplanar and multiecho pulse sequences of the thoracic spine were obtained without and with intravenous contrast.  CONTRAST:  15 mL MultiHance  COMPARISON:  Chest radiograph 03/02/2015.  FINDINGS: Widespread nonspecific decreased bone marrow signal in the visible skeleton. However, no thoracic compression fracture, thoracic vertebral destructive osseous lesion or marrow edema.  Thoracic spinal canal is patent without stenosis. No abnormal intradural enhancement. Thoracic spinal cord signal appears within normal limits. The conus medullaris appears normal at the T12 level.  Negative visualized posterior paraspinal soft tissues. The thoracic inlet is reported below. Elsewhere the visualized  mediastinal structures appear within normal limits. Lung parenchyma remarkable for bilateral lower lobe bronchiectasis and/or peribronchial increased signal. Negative visualized upper abdominal viscera.  At the thoracic inlet, the level of the thyroid, there is a nearly 4.5 cm segment of severe circumferential esophageal wall thickening (series 9, image 6, series 11, image 10, series 7, image 6). The wall thickening is up to 10 mm. The lumen in this region appears irregular on series 11, image 10. There is diffuse enhancement of the thickened wall. To the right of the trachea. In the right tracheoesophageal groove there is a 14 mm T1 and T2 hyperintense nodule which might enhance, suspicious for lymphadenopathy.  IMPRESSION: 1. 4 cm  segment of abnormal esophageal wall thickening at the thoracic inlet, suspicious for esophageal carcinoma. Possible enlarged 14 mm lymph node in the right tracheoesophageal groove. Recommend endoscopy. 2. Abnormal bone marrow signal diffusely, without destructive lesion or marrow edema in the thoracic spine. Query anemia causing red marrow reactivation. Otherwise metastatic disease to bone cannot be excluded. 3. No thoracic spinal stenosis, cord compression, or thoracic spinal cord abnormality identified to explain the sensory changes.   Electronically Signed   By: Genevie Ann M.D.   On: 04/26/2015 14:33   US Abdomen Complete  04/26/2015   CLINICAL DATA:  Abdominal pain for several months.  Pancreatitis.  EXAM: ULTRASOUND ABDOMEN COMPLETE  COMPARISON:  None.  FINDINGS: Gallbladder: No gallstones or wall thickening visualized. Sonographic Murphy sign indeterminate due to pain medication.  Common bile duct: Diameter: 4 mm  Liver: No focal lesion identified. Coarse echogenic liver with poor sonic penetration compatible with diffuse hepatic steatosis.  IVC: No abnormality visualized.  Pancreas: Borderline dilatation of the dorsal pancreatic duct at 3 mm. Pancreatic head poorly seen.  Spleen:  Size and appearance within normal limits.  Right Kidney: Length: 6.0 cm. Severely atrophic/dysplastic right kidney, with several associated calcifications.  Left Kidney: Length: 13.3 cm. Echogenicity within normal limits. No mass or hydronephrosis visualized.  Abdominal aorta: No aneurysm visualized. Marginal atherosclerotic calcification.  Other findings: None.  IMPRESSION: 1. Borderline dilatation of the dorsal pancreatic duct. The pancreatic head is poorly seen. Pancreatic body and tail appear otherwise unremarkable. No pseudocyst identified. 2. Severely atrophic/dysplastic right kidney. Compensatory hypertrophy of the left kidney. 3. Coarse echogenic liver with poor sonic penetration compatible with diffuse hepatic steatosis.   Electronically Signed   By: Van Clines M.D.   On: 04/26/2015 08:43   Mr 3d Recon At Scanner  04/28/2015   CLINICAL DATA:  Pancreatitis (likely secondary to ETOH), abdominal pain, borderline dilatation of pancreatic duct  EXAM: MRI ABDOMEN WITHOUT AND WITH CONTRAST (INCLUDING MRCP)  TECHNIQUE: Multiplanar multisequence MR imaging of the abdomen was performed both before and after the administration of intravenous contrast. Heavily T2-weighted images of the biliary and pancreatic ducts were obtained, and three-dimensional MRCP images were rendered by post processing.  CONTRAST:  73mL MULTIHANCE GADOBENATE DIMEGLUMINE 529 MG/ML IV SOLN  COMPARISON:  Abdominal ultrasound dated 04/26/2015  FINDINGS: Motion degraded images.  Lower chest:  Lung bases are clear.  Hepatobiliary: Liver is within normal limits. No suspicious/enhancing hepatic lesions. No hepatic steatosis.  Gallbladder is unremarkable. No intrahepatic or extrahepatic ductal dilatation.  No cholelithiasis or choledocholithiasis is seen.  Pancreas: Peripancreatic fluid/ inflammatory changes along the pancreatic head/ uncinate process and pancreatic tail, compatible with acute pancreatitis. No drainable fluid  collection/pseudocyst. No evidence of pancreatic mass. Pancreatic duct is within the upper limits of normal.  Spleen: Within normal limits.  Adrenals/Urinary Tract: Adrenal glands are within normal limits.  6 mm left upper pole renal cyst (series 8/image 30). Left kidney is otherwise within normal limits. No hydronephrosis.  Severely atrophic right kidney (series 8/ image 33). No hydronephrosis.  Stomach/Bowel: Stomach and visualized bowel is grossly unremarkable.  Vascular/Lymphatic: No evidence of abdominal aortic aneurysm.  No suspicious abdominal lymphadenopathy.  Other: Small volume retroperitoneal/abdominal ascites.  Musculoskeletal: No focal osseous lesions.  IMPRESSION: Motion degraded images.  Acute pancreatitis.  No drainable fluid collection/pseudocyst.  No evidence of pancreatic mass. Pancreatic duct is within the upper limits of normal.  No intrahepatic or extrahepatic ductal dilatation. No cholelithiasis or choledocholithiasis is seen.  Severely atrophic right kidney.  Electronically Signed   By: Julian Hy M.D.   On: 04/28/2015 10:24   Mr Abd W/wo Cm/mrcp  04/28/2015   CLINICAL DATA:  Pancreatitis (likely secondary to ETOH), abdominal pain, borderline dilatation of pancreatic duct  EXAM: MRI ABDOMEN WITHOUT AND WITH CONTRAST (INCLUDING MRCP)  TECHNIQUE: Multiplanar multisequence MR imaging of the abdomen was performed both before and after the administration of intravenous contrast. Heavily T2-weighted images of the biliary and pancreatic ducts were obtained, and three-dimensional MRCP images were rendered by post processing.  CONTRAST:  39mL MULTIHANCE GADOBENATE DIMEGLUMINE 529 MG/ML IV SOLN  COMPARISON:  Abdominal ultrasound dated 04/26/2015  FINDINGS: Motion degraded images.  Lower chest:  Lung bases are clear.  Hepatobiliary: Liver is within normal limits. No suspicious/enhancing hepatic lesions. No hepatic steatosis.  Gallbladder is unremarkable. No intrahepatic or extrahepatic ductal  dilatation.  No cholelithiasis or choledocholithiasis is seen.  Pancreas: Peripancreatic fluid/ inflammatory changes along the pancreatic head/ uncinate process and pancreatic tail, compatible with acute pancreatitis. No drainable fluid collection/pseudocyst. No evidence of pancreatic mass. Pancreatic duct is within the upper limits of normal.  Spleen: Within normal limits.  Adrenals/Urinary Tract: Adrenal glands are within normal limits.  6 mm left upper pole renal cyst (series 8/image 30). Left kidney is otherwise within normal limits. No hydronephrosis.  Severely atrophic right kidney (series 8/ image 33). No hydronephrosis.  Stomach/Bowel: Stomach and visualized bowel is grossly unremarkable.  Vascular/Lymphatic: No evidence of abdominal aortic aneurysm.  No suspicious abdominal lymphadenopathy.  Other: Small volume retroperitoneal/abdominal ascites.  Musculoskeletal: No focal osseous lesions.  IMPRESSION: Motion degraded images.  Acute pancreatitis.  No drainable fluid collection/pseudocyst.  No evidence of pancreatic mass. Pancreatic duct is within the upper limits of normal.  No intrahepatic or extrahepatic ductal dilatation. No cholelithiasis or choledocholithiasis is seen.  Severely atrophic right kidney.   Electronically Signed   By: Julian Hy M.D.   On: 04/28/2015 10:24     CBC  Recent Labs Lab 04/26/15 0321 04/27/15 0620 04/29/15 0620  WBC 3.9* 4.3 3.3*  HGB 13.0 11.7* 10.7*  HCT 36.0* 33.0* 30.4*  PLT 104* 58* 49*  MCV 100.0 104.8* 101.3*  MCH 36.1* 37.1* 35.7*  MCHC 36.1* 35.5 35.2  RDW 17.2* 18.0* 17.4*  LYMPHSABS 1.7  --   --   MONOABS 0.4  --   --   EOSABS 0.0  --   --   BASOSABS 0.0  --   --     Chemistries   Recent Labs Lab 04/26/15 0321 04/27/15 0620 04/29/15 0620  NA 136 134* 130*  K 4.0 4.4 3.3*  CL 103 109 102  CO2 24 21* 23  GLUCOSE 118* 81 79  BUN <5* 7 <5*  CREATININE 0.92 0.94 0.72  CALCIUM 8.7* 8.0* 8.1*  MG  --   --  1.1*  AST 104* 60* 68*   ALT 43 28 24  ALKPHOS 151* 123 112  BILITOT 0.7 1.5* 1.4*   ------------------------------------------------------------------------------------------------------------------ estimated creatinine clearance is 93.3 mL/min (by C-G formula based on Cr of 0.72). ------------------------------------------------------------------------------------------------------------------ No results for input(s): HGBA1C in the last 72 hours. ------------------------------------------------------------------------------------------------------------------ No results for input(s): CHOL, HDL, LDLCALC, TRIG, CHOLHDL, LDLDIRECT in the last 72 hours. ------------------------------------------------------------------------------------------------------------------ No results for input(s): TSH, T4TOTAL, T3FREE, THYROIDAB in the last 72 hours.  Invalid input(s): FREET3 ------------------------------------------------------------------------------------------------------------------ No results for input(s): VITAMINB12, FOLATE, FERRITIN, TIBC, IRON, RETICCTPCT in the last 72 hours.  Coagulation profile No results for input(s): INR, PROTIME in the last 168 hours.  No  results for input(s): DDIMER in the last 72 hours.  Cardiac Enzymes  Recent Labs Lab 04/26/15 0714 04/26/15 1522 04/26/15 1945  TROPONINI 0.03 0.07* 0.06*   ------------------------------------------------------------------------------------------------------------------ Invalid input(s): POCBNP   Time Spent in minutes   35   SINGH,PRASHANT K M.D on 04/29/2015 at 12:05 PM  Between 7am to 7pm - Pager - [phone]  After 7pm go to [email or web address] - password [facility]  [facility] -  Office  [phone]

## 2015-04-29 NOTE — Social Work (Signed)
CSW met with patient at patient's request. Patient identified that he was going home with home health but was concerned about a need for SNF placement later. This CSW made patient aware that he can reapply for SNF though his PCP if he needed additional support. CSW asked multiple times and patient confirmed that he was safe to return home alone. Patient stated that he believes that home PT will be sufficient for his recovery but would report to [facility] and PCP for this need. This CSW provided emotional support for patient as he is beginning to process current and recent diagnosis and prognosis.  Patient also stated that his mother's caregiver will provide transportation back home.   No further CSW needs at this time.  Christene Lye MSW, [place]

## 2015-04-30 DIAGNOSIS — F1010 Alcohol abuse, uncomplicated: Secondary | ICD-10-CM

## 2015-04-30 DIAGNOSIS — B182 Chronic viral hepatitis C: Secondary | ICD-10-CM

## 2015-04-30 LAB — CBC
HCT: 28.9 % — ABNORMAL LOW (ref 39.0–52.0)
HEMOGLOBIN: 10.3 g/dL — AB (ref 13.0–17.0)
MCH: 36 pg — ABNORMAL HIGH (ref 26.0–34.0)
MCHC: 35.6 g/dL (ref 30.0–36.0)
MCV: 101 fL — ABNORMAL HIGH (ref 78.0–100.0)
Platelets: 48 10*3/uL — ABNORMAL LOW (ref 150–400)
RBC: 2.86 MIL/uL — AB (ref 4.22–5.81)
RDW: 17.6 % — ABNORMAL HIGH (ref 11.5–15.5)
WBC: 2.8 10*3/uL — AB (ref 4.0–10.5)

## 2015-04-30 LAB — GLUCOSE, CAPILLARY: GLUCOSE-CAPILLARY: 87 mg/dL (ref 65–99)

## 2015-04-30 LAB — BASIC METABOLIC PANEL
ANION GAP: 5 (ref 5–15)
BUN: <5 mg/dL — ABNORMAL LOW (ref 6–20)
CALCIUM: 8.2 mg/dL — AB (ref 8.9–10.3)
CO2: 24 mmol/L (ref 22–32)
Chloride: 104 mmol/L (ref 101–111)
Creatinine, Ser: 0.68 mg/dL (ref 0.61–1.24)
Glucose, Bld: 95 mg/dL (ref 65–99)
POTASSIUM: 4 mmol/L (ref 3.5–5.1)
SODIUM: 133 mmol/L — AB (ref 135–145)

## 2015-04-30 LAB — LIPASE, BLOOD: LIPASE: 203 U/L — AB (ref 22–51)

## 2015-04-30 MED ORDER — OXYCODONE-ACETAMINOPHEN 7.5-325 MG PO TABS: 1.0000 | ORAL_TABLET | Freq: Four times a day (QID) | ORAL | Status: DC | PRN

## 2015-04-30 MED ORDER — BOOST / RESOURCE BREEZE PO LIQD: 1.0000 | Freq: Three times a day (TID) | ORAL | Status: DC

## 2015-04-30 MED ORDER — THIAMINE HCL 100 MG PO TABS: 100.0000 mg | ORAL_TABLET | Freq: Every day | ORAL | Status: DC

## 2015-04-30 MED ORDER — NICOTINE 21 MG/24HR TD PT24: 21.0000 mg | MEDICATED_PATCH | Freq: Every day | TRANSDERMAL | Status: DC

## 2015-04-30 MED ORDER — PANTOPRAZOLE SODIUM 40 MG PO TBEC: 40.0000 mg | DELAYED_RELEASE_TABLET | Freq: Every day | ORAL | Status: DC

## 2015-04-30 MED ORDER — FOLIC ACID 1 MG PO TABS: 1.0000 mg | ORAL_TABLET | Freq: Every day | ORAL | Status: DC

## 2015-04-30 NOTE — Care Management Important Message (Signed)
Important Message  Patient Details  Name: Patrick [name] MRN: [id number] Date of Birth: Aug 02, 1951   Medicare Important Message Given:  Yes-second notification given    Delorse Lek 04/30/2015, 3:28 PM

## 2015-04-30 NOTE — Progress Notes (Signed)
Daily Rounding Note  04/30/2015, 8:34 AM  LOS: 4 days   SUBJECTIVE:       No nausea. Tolerating clears. Feels like he could probably tolerate grits and full liquids. Pain in area of GE junction and upper abdomen as well as pain in his lower extremities. No vomiting  OBJECTIVE:         Vital signs in last 24 hours:    Temp:  [97.6 F (36.4 C)-98.5 F (36.9 C)] 98.1 F (36.7 C) (09/26 0525) Pulse Rate:  [84-93] 86 (09/26 0525) Resp:  [18-19] 18 (09/26 0525) BP: (128-168)/(83-107) 142/83 mmHg (09/26 0525) SpO2:  [94 %-100 %] 94 % (09/26 0525) Weight:  [162 lb 0.6 oz (73.5 kg)] 162 lb 0.6 oz (73.5 kg) (09/26 0525) Last BM Date: 04/24/15 Filed Weights   04/28/15 0553 04/29/15 0505 04/30/15 0525  Weight: 147 lb 14.9 oz (67.1 kg) 163 lb 6.4 oz (74.118 kg) 162 lb 0.6 oz (73.5 kg)   General: Pleasant, not acutely ill-appearing   Heart: RRR Chest: Clear bilaterally Abdomen: Soft, mild tenderness in the epigastric region. Somewhat distended.  Extremities: Slight, nonpitting pedal edema Neuro/Psych:  Pleasant, oriented 3. Speech is slow but precise inaccurate.  Intake/Output from previous day: 09/25 0701 - 09/26 0700 In: 2556.7 [P.O.:737; I.V.:1819.7] Out: 700 [Urine:700]  Intake/Output this shift:    Lab Results:  Recent Labs  04/29/15 0620 04/30/15 0337  WBC 3.3* 2.8*  HGB 10.7* 10.3*  HCT 30.4* 28.9*  PLT 49* 48*   BMET  Recent Labs  04/29/15 0620 04/29/15 1238 04/30/15 0337  NA 130* 129* 133*  K 3.3* 3.5 4.0  CL 102 101 104  CO2 23 22 24   GLUCOSE 79 88 95  BUN <5* <5* <5*  CREATININE 0.72 0.76 0.68  CALCIUM 8.1* 8.1* 8.2*   LFT  Recent Labs  04/29/15 0620 04/29/15 1238  PROT 5.9* 6.2*  ALBUMIN 2.0* 2.0*  AST 68* 77*  ALT 24 25  ALKPHOS 112 112  BILITOT 1.4* 1.3*   PT/INR No results for input(s): LABPROT, INR in the last 72 hours. Hepatitis Panel No results for input(s): HEPBSAG,  HCVAB, HEPAIGM, HEPBIGM in the last 72 hours.  Studies/Results: No results found.  Scheduled Meds: . aspirin  81 mg Oral Daily  . atorvastatin  40 mg Oral Daily  . chlordiazePOXIDE  5 mg Oral TID  . feeding supplement  1 Container Oral TID BM  . folic acid  1 mg Oral Daily  . gabapentin  300 mg Oral QHS  . lisinopril  20 mg Oral Daily  . metoprolol tartrate  50 mg Oral BID  . multivitamin with minerals  1 tablet Oral Once  . multivitamin with minerals  1 tablet Oral Daily  . nicotine  21 mg Transdermal Daily  . pantoprazole (PROTONIX) IV  40 mg Intravenous Q24H  . sodium chloride  3 mL Intravenous Q12H  . thiamine  100 mg Oral Daily   Continuous Infusions: . sodium chloride Stopped (04/29/15 1300)  . lactated ringers 50 mL/hr at 04/29/15 1313   PRN Meds:.cloNIDine, hydrALAZINE, hydrOXYzine, morphine injection, nitroGLYCERIN, polyethylene glycol, traMADol, triamcinolone cream   ASSESMENT:   *  Dysphagia. Upper esophageal mass. EGD 04/27/15: upper esophageal mass, gastropathy, short segment Barrett's.   *  Acute pancreatitis. Lipase improving  04/06/15 MRI/MRCP demonstrating acute pancreatitis with no pseudocyst or drainable fluid collections.    *  Alcoholism.  Active drinking.   *  Cirrhosis  of the liver, chronic hep C.  Labs suggest mild etoh hepatitis.   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   *  Hyponatremia, improving  *  Protein malnutrition.  *  Thrombocytopenia, chronic  *  Macrocytic anemia, chronic.   PLAN   *  Awaiting esophageal mass pathology  *  Switch to po Protonix. Consider advancing diet to full liquids but will discuss with attending GI MD   Azucena Freed  04/30/2015, 8:34 AM Pager: [phone]    Attending physician's note   I have taken an interval history, reviewed the chart and examined the patient. I agree with the Advanced Practitioner's note, impression and recommendations. 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 OK try advancing diet to full liquids. Pt wants to go home but we recommend that he stays to try advanced diet and for esophageal mass mgmt. He will likely need oncology, radiation oncology evaluation after pathology results available.     Pricilla Riffle. Fuller Plan, MD FACG [phone] Mon-Fri 8a-5p [phone] Mon-Fri 5p-8a, weekends, holidays or per [facility]

## 2015-04-30 NOTE — Discharge Instructions (Signed)
Follow with Primary MD in 7 days and the requested GI physician. Follow with your biopsy results within one week.   Get CBC, CMP, 2 view Chest X ray checked  by Primary MD next visit.    Activity: As tolerated with Full fall precautions use walker/cane & assistance as needed   Disposition Home     Diet: Soft low fat diet  For Heart failure patients - Check your Weight same time everyday, if you gain over 2 pounds, or you develop in leg swelling, experience more shortness of breath or chest pain, call your Primary MD immediately. Follow Cardiac Low Salt Diet and 1.5 lit/day fluid restriction.   On your next visit with your primary care physician please Get Medicines reviewed and adjusted.   Please request your Prim.MD to go over all Hospital Tests and Procedure/Radiological results at the follow up, please get all Hospital records sent to your Prim MD by signing hospital release before you go home.   If you experience worsening of your admission symptoms, develop shortness of breath, life threatening emergency, suicidal or homicidal thoughts you must seek medical attention immediately by calling 911 or calling your MD immediately  if symptoms less severe.  You Must read complete instructions/literature along with all the possible adverse reactions/side effects for all the Medicines you take and that have been prescribed to you. Take any new Medicines after you have completely understood and accpet all the possible adverse reactions/side effects.   Do not drive, operating heavy machinery, perform activities at heights, swimming or participation in water activities or provide baby sitting services if your were admitted for syncope or siezures until you have seen by Primary MD or a Neurologist and advised to do so again.  Do not drive when taking Pain medications.    Do not take more than prescribed Pain, Sleep and Anxiety Medications  Special Instructions: If you have smoked or chewed  Tobacco  in the last 2 yrs please stop smoking, stop any regular Alcohol  and or any Recreational drug use.  Wear Seat belts while driving.   Please note  You were cared for by a hospitalist during your hospital stay. If you have any questions about your discharge medications or the care you received while you were in the hospital after you are discharged, you can call the unit and asked to speak with the hospitalist on call if the hospitalist that took care of you is not available. Once you are discharged, your primary care physician will handle any further medical issues. Please note that NO REFILLS for any discharge medications will be authorized once you are discharged, as it is imperative that you return to your primary care physician (or establish a relationship with a primary care physician if you do not have one) for your aftercare needs so that they can reassess your need for medications and monitor your lab values.

## 2015-04-30 NOTE — Discharge Summary (Signed)
Patrick [name], is a 64 y.o. male  DOB 1950/09/29  MRN [id number].  Admission date:  04/26/2015  Admitting Physician  Patrick Costa, MD  Discharge Date:  04/30/2015   Primary MD  Pcp Not In System  Recommendations for primary care physician for things to follow:   Monitor esophageal biopsy results within 5-7 days. Patient is adamant to be discharged home as he has to take care of things according to him. Refused SNF placement. Extremely high chances of readmission within the next few days.   Admission Diagnosis  Alcohol abuse [F10.10] Back pain [M54.9] Pancreatitis [K85.9] Neuropathy [G62.9] Fall, initial encounter [W19.XXXA] Ankle pain, chronic, right [M25.571, G89.29]   Discharge Diagnosis  Alcohol abuse [F10.10] Back pain [M54.9] Pancreatitis [K85.9] Neuropathy [G62.9] Fall, initial encounter [W19.XXXA] Ankle pain, chronic, right [M25.571, G89.29]     Principal Problem:   Numbness Active Problems:   CAD (coronary artery disease)   Hypertension   Hyperlipidemia   Tobacco abuse   Cocaine abuse   Barrett's esophagus   Leukopenia and Thrombocytopenia   Chronic hepatitis C with cirrhosis   Alcoholism   Polysubstance abuse   Essential hypertension   Fall   Protein-calorie malnutrition, moderate   Weakness   Abdominal pain   Pancreatitis   Alcohol-induced acute pancreatitis   Abnormal CT scan, esophagus   Abnormal weight loss   Dysphagia, pharyngoesophageal phase      Past Medical History  Diagnosis Date  . MI (myocardial infarction)     x3, jan, aug, nov 2011; treated at [facility] in [place]  . Sexual dysfunction   . HTN (hypertension)   . Hyperlipidemia   . Chronic chest pain   . CAD (coronary artery disease)   . Thrombocytopenia   . SSBE (short-segment Barrett's esophagus) 11/11/11  . Colon  adenomas 11/11/11    x 5, one rectal tv adenoma  . GERD (gastroesophageal reflux disease)   . 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 Barrett's esophagus 11/17/2011    Short-segment dx 11/2011   . Arthritis     knees  . Cataract     bil removed  . Anemia   . Alcoholism 01/02/2012    cocaine abuse, herion abuse in past    Past Surgical History  Procedure Laterality Date  . Hernia repair      very young  . Balloon angioplasty, artery  03/2010  . Colonoscopy  11/11/11    Dr. Silvano [name]  . Esophagogastroduodenoscopy  11/11/11    Dr. Silvano [name]  . Cardiac catheterization  01/31/2011  . Coronary angioplasty    . Cataract extraction w/phaco  08/18/2012    Procedure: CATARACT EXTRACTION PHACO AND INTRAOCULAR LENS PLACEMENT (IOC);  Surgeon: Patrick Brook, MD;  Location: [facility];  Service: Ophthalmology;  Laterality: Right;  . Cataract extraction w/phaco Left 09/22/2012    Procedure: CATARACT EXTRACTION PHACO AND INTRAOCULAR LENS PLACEMENT (IOC);  Surgeon: Patrick Brook, MD;  Location: [facility];  Service: Ophthalmology;  Laterality: Left;  . Upper gastrointestinal endoscopy  HPI  from the history and physical done on the day of admission:    Patrick [name] is a 64 y.o. male with PMH of MI, HTN, HLD, CAD, chronic chest pain, GERD, ankle abuse, tobacco abuse, Patrick [name] the esophagus, HCV, cirrhosis, anemia, vasculitis, Raynaud's syndrome, thrombocytopenia, who presents with numbness, abdominal pain, fall, ankle swelling, weight loss, pain all over.  Patient reports that he has been having generalized numbness from chest down to his feet for several months. It has been progressively getting worse today. It is associated with generalized pain and generalized weakness. He reports that his symptoms cause him difficulty with activities of daily living and ambulation. He notes that he has an appointment with neurology on 05/16/15. He said he has pain all over except for back, particuarlly in  his epigastric area. He reports that he has a decreased appetite, and has weight loss 10-15 pounds over 3 months. He also notes 3 falls today secondary to weakness, no head injury, no LOC. He has chest pain, but no cough or shortness of breath. He also reports bilateral ankle swelling. Of note, pt has been drinking heavily and daily for a few years. Pt notes that he stopped drinking for a few months this year and began drinking again 4 months ago. He denies current hematochezia, melena, back pain, emesis, symptoms of UTI, rashes, unilateral weakness. He had vitamin B12 level of 515 on 03/30/15.  In ED, patient was found to have lipase 113, alcohol level 237, negative troponin, WBC 3.9, platelets 104, tachycardia, electrolytes okay, negative CT head of acute abnormalities.      Hospital Course:     1. Chronic numbness starting from anterior chest all the way down to both feet but perceives it anteriorly, ongoing for several months. Patient claims that he has a pending appointment with [facility] neurology on 05/16/2015. He denies any back pain. Does not have any objective weakness. No signs of cauda equina. B-12 is borderline, TSH stable, RPR, HIV & Uric acid Stable.   ESR is elevated and CT shows possible Esophageal Mass with possible T spine Met but no cord compression or bony destruction, discussed his case in detail with neurologist Dr. Doy [name], Continue PT and ASA, Supp rx. No further workup with outpatient follow-up with [facility] neurology as scheduled before.  Patient actually qualifies for SNF and is a poor candidate for home discharge, I discussed this with patient and his sister however he's admitting that he should be discharged today. I'm pretty certain that he will be back within the next few days as he will be unable to keep up his appointments and follow with GI, cardiology, neurology and possibly oncology soon.   2. Pain in both feet. Likely neuropathy. Uric acid stable. Ankle x-ray  stable. Supportive care with PT. Outpatient neurology follow-up.   3. History of alcoholic cirrhosis. Consumes large amounts of alcohol on a daily basis. Counseled to quit. No signs of DTs at this time will be discharged on folic acid and thiamine.   4. Essential hypertension. Continue home regimen BP stable.    5. Dyslipidemia on statin continue.   6. Possible mild pancreatitis. Likely alcohol induced. Bowel rest, IV fluids, pain control, lipid panel shows triglycerides of 96, ultrasound abdomen shows CBD at 4 mm. AST elevated in the pattern of alcohol abuse with improving trend of liver enzymes. Supportive care and monitor. GI onboard ERCP also confirms pancreatitis but no other acute abnormality. Pain is much improved and placed on soft diet does not  want to stay in the hospital. Discharge with outpatient follow-up with GI.   7. Esophageal Mass - GI called. Underwent EGD suggestive of circumferential esophageal mass, biopsies obtained and pending, patient admitted to be discharged requested to follow with GI and PCP within a week for biopsy results follow-up.   8. Chronic diastolic dysfunction. EF 55-60%. Wall motion abnormality noted on echogram. Troponin borderline in non-ACS pattern, he is not a admitted currently for invasive tests or procedures. On aspirin, statin, ACE inhibitor and beta blocker which will be continued for secondary prevention. No chest pain. Outpatient cardiology follow-up.        Discharge Condition: Fair  Follow UP  Follow-up Information    Follow up with [facility].   Why:  rolling walker   Contact information:   [address] [place] [address] [phone]       Follow up with [facility].   Why:  home health physical and occupational therapy and nurse   Contact information:   [address] [place] [address] [phone]       Follow up with Jerene Bears, MD. Schedule an appointment  as soon as possible for a visit in 1 week.   Specialty:  Gastroenterology   Why:  Likely esophageal cancer, biopsy follow-up   Contact information:   [address] [place] [address] [phone]       Follow up with [facility]. Schedule an appointment as soon as possible for a visit in 1 week.   Why:  Tingling and numbness in the chest wall has a pending appointment next month, kindly try to schedule in the next few weeks   Contact information:   [address] [place] [address] [phone]      Follow up with [facility]. Schedule an appointment as soon as possible for a visit in 1 week.   Why:  Echo with wall motion abnormality   Contact information:   [address] [place] [address]        Consults obtained - GI, neurology over the phone  Diet and Activity recommendation: See Discharge Instructions below  Discharge Instructions           Discharge Instructions    Discharge instructions    Complete by:  As directed   Follow with Primary MD in 7 days and the requested GI physician. Follow with your biopsy results within one week.   Get CBC, CMP, 2 view Chest X ray checked  by Primary MD next visit.    Activity: As tolerated with Full fall precautions use walker/cane & assistance as needed   Disposition Home     Diet: Soft low fat diet  For Heart failure patients - Check your Weight same time everyday, if you gain over 2 pounds, or you develop in leg swelling, experience more shortness of breath or chest pain, call your Primary MD immediately. Follow Cardiac Low Salt Diet and 1.5 lit/day fluid restriction.   On your next visit with your primary care physician please Get Medicines reviewed and adjusted.   Please request your Prim.MD to go over all Hospital Tests and Procedure/Radiological results at the follow up, please get all Hospital records sent to your Prim MD by  signing hospital release before you go home.   If you experience worsening of your admission symptoms, develop shortness of breath, life threatening emergency, suicidal  or homicidal thoughts you must seek medical attention immediately by calling 911 or calling your MD immediately  if symptoms less severe.  You Must read complete instructions/literature along with all the possible adverse reactions/side effects for all the Medicines you take and that have been prescribed to you. Take any new Medicines after you have completely understood and accpet all the possible adverse reactions/side effects.   Do not drive, operating heavy machinery, perform activities at heights, swimming or participation in water activities or provide baby sitting services if your were admitted for syncope or siezures until you have seen by Primary MD or a Neurologist and advised to do so again.  Do not drive when taking Pain medications.    Do not take more than prescribed Pain, Sleep and Anxiety Medications  Special Instructions: If you have smoked or chewed Tobacco  in the last 2 yrs please stop smoking, stop any regular Alcohol  and or any Recreational drug use.  Wear Seat belts while driving.   Please note  You were cared for by a hospitalist during your hospital stay. If you have any questions about your discharge medications or the care you received while you were in the hospital after you are discharged, you can call the unit and asked to speak with the hospitalist on call if the hospitalist that took care of you is not available. Once you are discharged, your primary care physician will handle any further medical issues. Please note that NO REFILLS for any discharge medications will be authorized once you are discharged, as it is imperative that you return to your primary care physician (or establish a relationship with a primary care physician if you do not have one) for your aftercare needs so that they can  reassess your need for medications and monitor your lab values.     Increase activity slowly    Complete by:  As directed              Discharge Medications       Medication List    STOP taking these medications        Hydrocodone-Acetaminophen 5-300 MG Tabs      TAKE these medications        aspirin 81 MG tablet  Take 81 mg by mouth daily.     atorvastatin 40 MG tablet  Commonly known as:  LIPITOR  Take 1 tablet (40 mg total) by mouth daily at 6 PM.     carvedilol 12.5 MG tablet  Commonly known as:  COREG  Take 12.5 mg by mouth 2 (two) times daily with a meal.     feeding supplement Liqd  Take 1 Container by mouth 3 (three) times daily between meals.     folic acid 1 MG tablet  Commonly known as:  FOLVITE  Take 1 tablet (1 mg total) by mouth daily.     furosemide 20 MG tablet  Commonly known as:  LASIX  Take 1 tablet as needed for fluid retention     gabapentin 300 MG capsule  Commonly known as:  NEURONTIN  Take 300 mg by mouth at bedtime.     lisinopril 20 MG tablet  Commonly known as:  PRINIVIL,ZESTRIL  Take 20 mg by mouth daily.     nicotine 21 mg/24hr patch  Commonly known as:  NICODERM CQ - dosed in mg/24 hours  Place 1 patch (21 mg total) onto the skin daily.     nitroGLYCERIN 0.4 MG SL tablet  Commonly  known as:  NITROSTAT  Place 1 tablet (0.4 mg total) under the tongue every 5 (five) minutes as needed for chest pain.     omeprazole 20 MG capsule  Commonly known as:  PRILOSEC  Take 1 capsule (20 mg total) by mouth 2 (two) times daily.     oxyCODONE-acetaminophen 7.5-325 MG per tablet  Commonly known as:  PERCOCET  Take 1 tablet by mouth every 6 (six) hours as needed for severe pain.     polyethylene glycol packet  Commonly known as:  MIRALAX / GLYCOLAX  Take 17 g by mouth daily as needed.     thiamine 100 MG tablet  Take 1 tablet (100 mg total) by mouth daily.     traMADol 50 MG tablet  Commonly known as:  ULTRAM  Take 50 mg by  mouth every 12 (twelve) hours as needed for moderate pain.     triamcinolone cream 0.1 %  Commonly known as:  KENALOG  Apply 1 application topically 2 (two) times daily as needed (apply to rash).        Major procedures and Radiology Reports - PLEASE review detailed and final reports for all details, in brief -       Dg Ankle Complete Right  04/26/2015   CLINICAL DATA:  Severe right ankle pain for the past 3 months. No known injury.  EXAM: RIGHT ANKLE - COMPLETE 3+ VIEW  COMPARISON:  None.  FINDINGS: Mild distal medial malleolus spur formation. Arterial calcifications and calcified phleboliths. No effusion seen.  IMPRESSION: Mild degenerative change.   Electronically Signed   By: Claudie Revering M.D.   On: 04/26/2015 15:19   Ct Head Wo Contrast  04/26/2015   CLINICAL DATA:  Initial evaluation for left lower extremity numbness.  EXAM: CT HEAD WITHOUT CONTRAST  TECHNIQUE: Contiguous axial images were obtained from the base of the skull through the vertex without intravenous contrast.  COMPARISON:  Prior study from 11/16/2010.  FINDINGS: There is no acute intracranial hemorrhage or infarct. No mass lesion or midline shift. Gray-white matter differentiation is well maintained. Ventricles are normal in size without evidence of hydrocephalus. CSF containing spaces are within normal limits. No extra-axial fluid collection. Generalized cerebral atrophy with mild chronic microvascular ischemic disease present. Scattered vascular calcifications within the carotid siphons.  The calvarium is intact.  Orbital soft tissues are within normal limits.  The paranasal sinuses and mastoid air cells are well pneumatized and free of fluid.  Scalp soft tissues are unremarkable.  IMPRESSION: 1. No acute intracranial process. 2. Generalized age-related cerebral atrophy with chronic microvascular ischemic disease.   Electronically Signed   By: Jeannine Boga M.D.   On: 04/26/2015 04:19   Mr Thoracic Spine W Wo  Contrast  04/26/2015   CLINICAL DATA:  64 year old male with numbness from the chest down for several months, progressively increasing. Generalized pain in weakness. Unintentional weight loss x3 months. Initial encounter.  EXAM: MRI THORACIC SPINE WITHOUT AND WITH CONTRAST  TECHNIQUE: Multiplanar and multiecho pulse sequences of the thoracic spine were obtained without and with intravenous contrast.  CONTRAST:  15 mL MultiHance  COMPARISON:  Chest radiograph 03/02/2015.  FINDINGS: Widespread nonspecific decreased bone marrow signal in the visible skeleton. However, no thoracic compression fracture, thoracic vertebral destructive osseous lesion or marrow edema.  Thoracic spinal canal is patent without stenosis. No abnormal intradural enhancement. Thoracic spinal cord signal appears within normal limits. The conus medullaris appears normal at the T12 level.  Negative visualized posterior paraspinal soft  tissues. The thoracic inlet is reported below. Elsewhere the visualized mediastinal structures appear within normal limits. Lung parenchyma remarkable for bilateral lower lobe bronchiectasis and/or peribronchial increased signal. Negative visualized upper abdominal viscera.  At the thoracic inlet, the level of the thyroid, there is a nearly 4.5 cm segment of severe circumferential esophageal wall thickening (series 9, image 6, series 11, image 10, series 7, image 6). The wall thickening is up to 10 mm. The lumen in this region appears irregular on series 11, image 10. There is diffuse enhancement of the thickened wall. To the right of the trachea. In the right tracheoesophageal groove there is a 14 mm T1 and T2 hyperintense nodule which might enhance, suspicious for lymphadenopathy.  IMPRESSION: 1. 4 cm segment of abnormal esophageal wall thickening at the thoracic inlet, suspicious for esophageal carcinoma. Possible enlarged 14 mm lymph node in the right tracheoesophageal groove. Recommend endoscopy. 2. Abnormal bone  marrow signal diffusely, without destructive lesion or marrow edema in the thoracic spine. Query anemia causing red marrow reactivation. Otherwise metastatic disease to bone cannot be excluded. 3. No thoracic spinal stenosis, cord compression, or thoracic spinal cord abnormality identified to explain the sensory changes.   Electronically Signed   By: Genevie Ann M.D.   On: 04/26/2015 14:33   US Abdomen Complete  04/26/2015   CLINICAL DATA:  Abdominal pain for several months.  Pancreatitis.  EXAM: ULTRASOUND ABDOMEN COMPLETE  COMPARISON:  None.  FINDINGS: Gallbladder: No gallstones or wall thickening visualized. Sonographic Murphy sign indeterminate due to pain medication.  Common bile duct: Diameter: 4 mm  Liver: No focal lesion identified. Coarse echogenic liver with poor sonic penetration compatible with diffuse hepatic steatosis.  IVC: No abnormality visualized.  Pancreas: Borderline dilatation of the dorsal pancreatic duct at 3 mm. Pancreatic head poorly seen.  Spleen: Size and appearance within normal limits.  Right Kidney: Length: 6.0 cm. Severely atrophic/dysplastic right kidney, with several associated calcifications.  Left Kidney: Length: 13.3 cm. Echogenicity within normal limits. No mass or hydronephrosis visualized.  Abdominal aorta: No aneurysm visualized. Marginal atherosclerotic calcification.  Other findings: None.  IMPRESSION: 1. Borderline dilatation of the dorsal pancreatic duct. The pancreatic head is poorly seen. Pancreatic body and tail appear otherwise unremarkable. No pseudocyst identified. 2. Severely atrophic/dysplastic right kidney. Compensatory hypertrophy of the left kidney. 3. Coarse echogenic liver with poor sonic penetration compatible with diffuse hepatic steatosis.   Electronically Signed   By: Van Clines M.D.   On: 04/26/2015 08:43   Mr 3d Recon At Scanner  04/28/2015   CLINICAL DATA:  Pancreatitis (likely secondary to ETOH), abdominal pain, borderline dilatation of  pancreatic duct  EXAM: MRI ABDOMEN WITHOUT AND WITH CONTRAST (INCLUDING MRCP)  TECHNIQUE: Multiplanar multisequence MR imaging of the abdomen was performed both before and after the administration of intravenous contrast. Heavily T2-weighted images of the biliary and pancreatic ducts were obtained, and three-dimensional MRCP images were rendered by post processing.  CONTRAST:  28mL MULTIHANCE GADOBENATE DIMEGLUMINE 529 MG/ML IV SOLN  COMPARISON:  Abdominal ultrasound dated 04/26/2015  FINDINGS: Motion degraded images.  Lower chest:  Lung bases are clear.  Hepatobiliary: Liver is within normal limits. No suspicious/enhancing hepatic lesions. No hepatic steatosis.  Gallbladder is unremarkable. No intrahepatic or extrahepatic ductal dilatation.  No cholelithiasis or choledocholithiasis is seen.  Pancreas: Peripancreatic fluid/ inflammatory changes along the pancreatic head/ uncinate process and pancreatic tail, compatible with acute pancreatitis. No drainable fluid collection/pseudocyst. No evidence of pancreatic mass. Pancreatic duct is within the upper  limits of normal.  Spleen: Within normal limits.  Adrenals/Urinary Tract: Adrenal glands are within normal limits.  6 mm left upper pole renal cyst (series 8/image 30). Left kidney is otherwise within normal limits. No hydronephrosis.  Severely atrophic right kidney (series 8/ image 33). No hydronephrosis.  Stomach/Bowel: Stomach and visualized bowel is grossly unremarkable.  Vascular/Lymphatic: No evidence of abdominal aortic aneurysm.  No suspicious abdominal lymphadenopathy.  Other: Small volume retroperitoneal/abdominal ascites.  Musculoskeletal: No focal osseous lesions.  IMPRESSION: Motion degraded images.  Acute pancreatitis.  No drainable fluid collection/pseudocyst.  No evidence of pancreatic mass. Pancreatic duct is within the upper limits of normal.  No intrahepatic or extrahepatic ductal dilatation. No cholelithiasis or choledocholithiasis is seen.   Severely atrophic right kidney.   Electronically Signed   By: Julian Hy M.D.   On: 04/28/2015 10:24   Mr Abd W/wo Cm/mrcp  04/28/2015   CLINICAL DATA:  Pancreatitis (likely secondary to ETOH), abdominal pain, borderline dilatation of pancreatic duct  EXAM: MRI ABDOMEN WITHOUT AND WITH CONTRAST (INCLUDING MRCP)  TECHNIQUE: Multiplanar multisequence MR imaging of the abdomen was performed both before and after the administration of intravenous contrast. Heavily T2-weighted images of the biliary and pancreatic ducts were obtained, and three-dimensional MRCP images were rendered by post processing.  CONTRAST:  22mL MULTIHANCE GADOBENATE DIMEGLUMINE 529 MG/ML IV SOLN  COMPARISON:  Abdominal ultrasound dated 04/26/2015  FINDINGS: Motion degraded images.  Lower chest:  Lung bases are clear.  Hepatobiliary: Liver is within normal limits. No suspicious/enhancing hepatic lesions. No hepatic steatosis.  Gallbladder is unremarkable. No intrahepatic or extrahepatic ductal dilatation.  No cholelithiasis or choledocholithiasis is seen.  Pancreas: Peripancreatic fluid/ inflammatory changes along the pancreatic head/ uncinate process and pancreatic tail, compatible with acute pancreatitis. No drainable fluid collection/pseudocyst. No evidence of pancreatic mass. Pancreatic duct is within the upper limits of normal.  Spleen: Within normal limits.  Adrenals/Urinary Tract: Adrenal glands are within normal limits.  6 mm left upper pole renal cyst (series 8/image 30). Left kidney is otherwise within normal limits. No hydronephrosis.  Severely atrophic right kidney (series 8/ image 33). No hydronephrosis.  Stomach/Bowel: Stomach and visualized bowel is grossly unremarkable.  Vascular/Lymphatic: No evidence of abdominal aortic aneurysm.  No suspicious abdominal lymphadenopathy.  Other: Small volume retroperitoneal/abdominal ascites.  Musculoskeletal: No focal osseous lesions.  IMPRESSION: Motion degraded images.  Acute  pancreatitis.  No drainable fluid collection/pseudocyst.  No evidence of pancreatic mass. Pancreatic duct is within the upper limits of normal.  No intrahepatic or extrahepatic ductal dilatation. No cholelithiasis or choledocholithiasis is seen.  Severely atrophic right kidney.   Electronically Signed   By: Julian Hy M.D.   On: 04/28/2015 10:24    Micro Results      Recent Results (from the past 240 hour(s))  Culture, blood (routine x 2)     Status: None (Preliminary result)   Collection Time: 04/26/15  3:22 PM  Result Value Ref Range Status   Specimen Description BLOOD RIGHT HAND  Final   Special Requests BOTTLES DRAWN AEROBIC AND ANAEROBIC 5CC  Final   Culture NO GROWTH 3 DAYS  Final   Report Status PENDING  Incomplete  Culture, blood (routine x 2)     Status: None (Preliminary result)   Collection Time: 04/26/15  3:28 PM  Result Value Ref Range Status   Specimen Description BLOOD LEFT ARM  Final   Special Requests BOTTLES DRAWN AEROBIC AND ANAEROBIC 5CC  Final   Culture NO GROWTH 3 DAYS  Final   Report Status PENDING  Incomplete    Today   Subjective    Patrick [name] today has no headache,no chest abdominal pain,no new weakness tingling or numbness, feels much better wants to go home today.     Objective   Blood pressure 147/100, pulse 94, temperature 98.1 F (36.7 C), temperature source Oral, resp. rate 18, height $RemoveBe'5\' 9"'BiNyyoqjb$  (1.753 m), weight 73.5 kg (162 lb 0.6 oz), SpO2 94 %.   Intake/Output Summary (Last 24 hours) at 04/30/15 0939 Last data filed at 04/30/15 0834  Gross per 24 hour  Intake 1515.33 ml  Output    900 ml  Net 615.33 ml    Exam Awake Alert, Oriented x 3, No new F.N deficits, Normal affect [place].AT,PERRAL Supple Neck,No JVD, No cervical lymphadenopathy appriciated.  Symmetrical Chest wall movement, Good air movement bilaterally, CTAB RRR,No Gallops,Rubs or new Murmurs, No Parasternal Heave +ve B.Sounds, Abd Soft, Non tender, No organomegaly  appriciated, No rebound -guarding or rigidity. No Cyanosis, Clubbing or edema, No new Rash or bruise   Data Review   CBC w Diff:  Lab Results  Component Value Date   WBC 2.8* 04/30/2015   WBC 3.5* 06/02/2011   HGB 10.3* 04/30/2015   HGB 13.3 06/02/2011   HCT 28.9* 04/30/2015   HCT 38.8 06/02/2011   PLT 48* 04/30/2015   PLT 109* 06/02/2011   LYMPHOPCT 44 04/26/2015   LYMPHOPCT 42.9 06/02/2011   MONOPCT 9 04/26/2015   MONOPCT 13.8 06/02/2011   EOSPCT 1 04/26/2015   EOSPCT 6.6 06/02/2011   BASOPCT 1 04/26/2015   BASOPCT 0.6 06/02/2011    CMP:  Lab Results  Component Value Date   NA 133* 04/30/2015   K 4.0 04/30/2015   CL 104 04/30/2015   CO2 24 04/30/2015   BUN <5* 04/30/2015   CREATININE 0.68 04/30/2015   CREATININE 1.12 02/02/2012   PROT 6.2* 04/29/2015   ALBUMIN 2.0* 04/29/2015   BILITOT 1.3* 04/29/2015   ALKPHOS 112 04/29/2015   AST 77* 04/29/2015   ALT 25 04/29/2015  .   Total Time in preparing paper work, data evaluation and todays exam - 35 minutes  Thurnell Lose M.D on 04/30/2015 at 9:39 AM  [facility]   Office  [phone]

## 2015-04-30 NOTE — Care Management Note (Signed)
Case Management Note  Patient Details  Name: Patrick [name] MRN: [id number] Date of Birth: 06-05-1951  Subjective/Objective:     Patient is for dc today, Miranda with [facility] notified and Jermain with [facility] notified for DME.  Patient  States his mother and her friend will transport him home today.   Patient states he sees Dr. Alphonzo Grieve at [facility]  Asked if he would like an apt scheduled , he states he rather walk in , he will get seen faster that way.   Patient states he has no problem getting medications.                 Action/Plan:   Expected Discharge Date:  04/29/15               Expected Discharge Plan:  [place]  In-House Referral:  Clinical Social Work  Discharge planning Services  CM Consult  Post Acute Care Choice:  Durable Medical Equipment, Home Health Choice offered to:  Patient  DME Arranged:  [place] rolling, Shower stool DME Agency:  [place]:  PT, OT, RN [place] Agency:  [place]  Status of Service:  Completed, signed off  Medicare Important Message Given:    Date Medicare IM Given:    Medicare IM give by:    Date Additional Medicare IM Given:    Additional Medicare Important Message give by:     If discussed at [place] of Stay Meetings, dates discussed:    Additional Comments:  Zenon Mayo, RN 04/30/2015, 10:20 AM

## 2015-04-30 NOTE — Progress Notes (Signed)
Patient discharged home. Instructions given. Printed prescriptions sent. IV and tele removed at discharge. No more needs from care management and/or social work. Patient stable, on room air. Earleen Reaper, RN

## 2015-05-01 ENCOUNTER — Telehealth: Payer: Self-pay | Admitting: Internal Medicine

## 2015-05-01 ENCOUNTER — Encounter (HOSPITAL_COMMUNITY): Payer: Self-pay | Admitting: Internal Medicine

## 2015-05-01 ENCOUNTER — Other Ambulatory Visit: Payer: Self-pay

## 2015-05-01 DIAGNOSIS — C159 Malignant neoplasm of esophagus, unspecified: Secondary | ICD-10-CM

## 2015-05-01 LAB — CULTURE, BLOOD (ROUTINE X 2)
Culture: NO GROWTH
Culture: NO GROWTH

## 2015-05-01 SURGERY — ERCP, WITH INTERVENTION IF INDICATED
Anesthesia: General

## 2015-05-01 NOTE — Telephone Encounter (Signed)
Referrals entered in epic.

## 2015-05-01 NOTE — Telephone Encounter (Signed)
Pt calling for path results and wants to know when he should follow-up. Please advise.

## 2015-05-01 NOTE — Telephone Encounter (Signed)
Patrick [name], see prior comment.

## 2015-05-01 NOTE — Telephone Encounter (Signed)
Patient contacted by phone twice this morning. 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

## 2015-05-01 NOTE — Telephone Encounter (Signed)
Spoke to patient by phone see result note

## 2015-05-01 NOTE — Telephone Encounter (Signed)
Pt called back and left message on voicemail for path results and plan. Left number [phone] to call back.

## 2015-05-03 ENCOUNTER — Telehealth: Payer: Self-pay | Admitting: Internal Medicine

## 2015-05-03 NOTE — Telephone Encounter (Signed)
I have faxed a letter from Dr Carlean Purl to Cyndee Brightly at fax [phone] (phone [phone]). Patient is advised.

## 2015-05-03 NOTE — Telephone Encounter (Signed)
This is Dr Celesta Aver patient. Dr Hilarie Fredrickson did endoscopy in the hospital showing cancer diagnosis. Dr Carlean Purl states that he is okay sending a brief note indicating that Patrick [name] has a cancer diagnosis. We will create a letter and send that information to patient's lawyer, Patrick [name] per the patient's request.

## 2015-05-09 ENCOUNTER — Telehealth: Payer: Self-pay | Admitting: Oncology

## 2015-05-09 NOTE — Telephone Encounter (Signed)
GI MDC-s/w patient and gave appt for 10/07 @ 9:15 w/Dr. Benay Spice.

## 2015-05-10 ENCOUNTER — Ambulatory Visit: Payer: Medicare HMO | Admitting: Physician Assistant

## 2015-05-10 ENCOUNTER — Encounter: Payer: Self-pay | Admitting: Nutrition

## 2015-05-11 ENCOUNTER — Encounter: Payer: Self-pay | Admitting: Radiation Oncology

## 2015-05-11 ENCOUNTER — Ambulatory Visit: Payer: Medicare HMO | Admitting: Nutrition

## 2015-05-11 ENCOUNTER — Encounter: Payer: Self-pay | Admitting: *Deleted

## 2015-05-11 ENCOUNTER — Ambulatory Visit: Payer: Medicare HMO | Attending: Oncology | Admitting: Physical Therapy

## 2015-05-11 ENCOUNTER — Ambulatory Visit
Admission: RE | Admit: 2015-05-11 | Discharge: 2015-05-11 | Disposition: A | Discharge status: Home / Self Care | Payer: Medicare HMO | Source: Ambulatory Visit | Attending: Radiation Oncology | Admitting: Radiation Oncology

## 2015-05-11 ENCOUNTER — Ambulatory Visit (HOSPITAL_BASED_OUTPATIENT_CLINIC_OR_DEPARTMENT_OTHER): Payer: Medicare HMO | Admitting: Oncology

## 2015-05-11 ENCOUNTER — Telehealth: Payer: Self-pay | Admitting: Oncology

## 2015-05-11 ENCOUNTER — Telehealth: Payer: Self-pay | Admitting: *Deleted

## 2015-05-11 VITALS — BP 151/97 | HR 105 | Temp 98.2°F | Resp 18 | Ht 69.0 in | Wt 162.1 lb

## 2015-05-11 DIAGNOSIS — R1310 Dysphagia, unspecified: Secondary | ICD-10-CM | POA: Diagnosis not present

## 2015-05-11 DIAGNOSIS — C159 Malignant neoplasm of esophagus, unspecified: Secondary | ICD-10-CM

## 2015-05-11 DIAGNOSIS — C153 Malignant neoplasm of upper third of esophagus: Secondary | ICD-10-CM

## 2015-05-11 DIAGNOSIS — K7460 Unspecified cirrhosis of liver: Secondary | ICD-10-CM

## 2015-05-11 DIAGNOSIS — C154 Malignant neoplasm of middle third of esophagus: Secondary | ICD-10-CM

## 2015-05-11 DIAGNOSIS — B1920 Unspecified viral hepatitis C without hepatic coma: Secondary | ICD-10-CM

## 2015-05-11 DIAGNOSIS — D696 Thrombocytopenia, unspecified: Secondary | ICD-10-CM

## 2015-05-11 DIAGNOSIS — K2270 Barrett's esophagus without dysplasia: Secondary | ICD-10-CM

## 2015-05-11 MED ORDER — OXYCODONE-ACETAMINOPHEN 7.5-325 MG PO TABS: 1.0000 | ORAL_TABLET | Freq: Four times a day (QID) | ORAL | Status: DC | PRN

## 2015-05-11 NOTE — Progress Notes (Signed)
PATIENT WAS SEEN IN GI CLINIC.  64 year old male diagnosed with esophageal cancer.  He is a patient of Dr. Benay Spice.  PMH includes MI, HTN, HLD, CAD,Barrett's esophagus, GERD, chronic hepatitis C with cirrhosis, arthritis, polysubstance abuse.  Medications include lipitor, folvite, lasix, prilosec, and miralax.  Labs include Na 133 on 9/26.  Height:  69 inches. Weight: 162 pounds. UBW: 182 pounds. BMI: 23.92  Patient did not want to stay longer today to talk to me.  Patient complained of being in pain and wanting to go home. He did report poor appetite and states he doesn't want to eat. States he has Ensure at home.  Nutrition Diagnosis:  Unintended weight loss related to esophageal cancer and associated treatments as evidenced by 20 pound weight loss from UBW.  Intervention:   Educated patient to try to eat more often and include snacks. Recommended 4 Ensure Plus daily. Left fact sheets with patient. Provided contact information.  Monitoring, Evaluation, Goals:  Patient will tolerate increased oral intake to minimize weight loss.  Next Visit:  To be scheduled with treatments.

## 2015-05-11 NOTE — Progress Notes (Signed)
[facility] Patient Consult   Referring MD: Silvano Rusk   Reason for Referral: Esophagus cancer   HPI: Mr. Janis has a complex medical history including cirrhosis, coronary artery disease, and polysubstance abuse. He was admitted 04/26/2015 with chest pain and a report of numbness from his chest to his feet. He was referred for an MRI of the thoracic spine on 04/26/2015. Widespread nonspecific decreased bone marrow signal was noted. No spinal stenosis. The thoracic spinal cord signal appeared normal. A 4.5 cm segment of severe circumferential esophageal wall thickening was noted at the level of the thyroid. At the right tracheoesophageal groove a 14 mm nodule was suspicious for lymphadenopathy.  An MRI of the abdomen on 04/28/2015 revealed peripancreatic fluid/inflammatory changes at the pancreas head and tail compatible with acute pancreatitis. No fluid collection. No pancreas mass. Atrophic right kidney.  He was taken to an upper endoscopy by Dr.Pyrtle on 04/27/2015. A near circumferential mass was noted at 20 cm from the incisors. The mass was nonobstructing. Short segment Barrett's esophagus was noted in the lower 30 esophagus. The pathology ([id number]) revealed invasive squamous cell carcinoma.  He reports persistent solid dysphagia. He is tolerating liquids.  Past Medical History  Diagnosis Date  . MI (myocardial infarction) ([place])     x3, jan, aug, nov 2011; treated at [facility] in [place]  . Sexual dysfunction   . HTN (hypertension)   . Hyperlipidemia   . Chronic chest pain   . CAD (coronary artery disease)   . Thrombocytopenia ([place])   .  pancreatitis   September 2016   . Colon adenomas 11/11/11    x 5, one rectal tv adenoma  . GERD (gastroesophageal reflux disease)   . Chronic hepatitis C with cirrhosis ([place]) 01/02/2012  . Barrett's esophagus 11/17/2011    Short-segment dx 11/2011   . Arthritis     knees  .        Marland Kitchen Anemia   . Alcoholism ([place]) 01/02/2012    cocaine abuse, herion abuse in past    Past Surgical History  Procedure Laterality Date  . Hernia repair      very young  . Balloon angioplasty, artery  03/2010  . Colonoscopy  11/11/11    Dr. Silvano Rusk  . Esophagogastroduodenoscopy  11/11/11    Dr. Silvano Rusk  . Cardiac catheterization  01/31/2011  . Coronary angioplasty    . Cataract extraction w/phaco  08/18/2012    Procedure: CATARACT EXTRACTION PHACO AND INTRAOCULAR LENS PLACEMENT (IOC);  Surgeon: Adonis Brook, MD;  Location: [facility];  Service: Ophthalmology;  Laterality: Right;  . Cataract extraction w/phaco Left 09/22/2012    Procedure: CATARACT EXTRACTION PHACO AND INTRAOCULAR LENS PLACEMENT (IOC);  Surgeon: Adonis Brook, MD;  Location: [facility];  Service: Ophthalmology;  Laterality: Left;  . Upper gastrointestinal endoscopy    . Esophagogastroduodenoscopy (egd) with propofol N/A 04/27/2015    Procedure: ESOPHAGOGASTRODUODENOSCOPY (EGD) WITH PROPOFOL;  Surgeon: Jerene Bears, MD;  Location: [facility] ENDOSCOPY;  Service: Endoscopy;  Laterality: N/A;    Medications: Reviewed  Allergies: No Known Allergies  Family history: No family history of cancer  Social History:   He lives alone in [place]. He is disabled secondary to heart disease. He previously worked as a Administrator. He smokes cigarettes and drinks "wine ".    ROS:   Positives include: Anorexia, 10-15 pound weight loss, solid dysphagia, numbness from the chest to the feet, pain at the anterior chest, decreased coordination with leg weakness  A complete ROS was otherwise negative.  Physical Exam:  Blood pressure 151/97, pulse 105, temperature 98.2 F (36.8 C), temperature source Oral, resp. rate 18, height _0  (1.753 m), weight 162 lb 1.6 oz (73.528 kg), SpO2 99 %.  HEENT: Oropharynx without visible mass, neck without mass Lungs: Clear bilaterally, distant breath sounds, no respiratory distress Cardiac: Regular rate and rhythm Abdomen: No hepatosplenomegaly,  tender in the right upper abdomen, no mass, no apparent ascites GU: Testes without mass  Vascular: 1+ pitting edema below the knee bilaterally Lymph nodes: No cervical, supraclavicular, axillary, or inguinal nodes Neurologic: Alert and oriented, arm and leg strength appears intact. Decreased strength with dorsi flexion at the foot bilaterally. The deep tendon reflexes are 2+ and symmetric at the knees, diminished reflexes at the right biceps Skin: No rash Musculoskeletal: No spine tenderness   LAB:  CBC  Lab Results  Component Value Date   WBC 2.8* 04/30/2015   HGB 10.3* 04/30/2015   HCT 28.9* 04/30/2015   MCV 101.0* 04/30/2015   PLT 48* 04/30/2015   NEUTROABS 1.8 04/26/2015     CMP      Component Value Date/Time   NA 133* 04/30/2015 0337   K 4.0 04/30/2015 0337   CL 104 04/30/2015 0337   CO2 24 04/30/2015 0337   GLUCOSE 95 04/30/2015 0337   BUN <5* 04/30/2015 0337   CREATININE 0.68 04/30/2015 0337   CREATININE 1.12 02/02/2012 1051   CALCIUM 8.2* 04/30/2015 0337   PROT 6.2* 04/29/2015 1238   ALBUMIN 2.0* 04/29/2015 1238   AST 77* 04/29/2015 1238   ALT 25 04/29/2015 1238   ALKPHOS 112 04/29/2015 1238   BILITOT 1.3* 04/29/2015 1238   GFRNONAA >60 04/30/2015 0337   GFRNONAA 71 02/02/2012 1051   GFRAA >60 04/30/2015 0337   GFRAA 82 02/02/2012 1051      Imaging: As per history of present illness, 04/26/2015 thoracic MRI reviewed   Assessment/Plan:   1. Squamous cell carcinoma the esophagus, mass noted at 20 cm from the incisors on endoscopy 04/27/2015  MRI of the thoracic spine 04/26/2015 revealed a 4 cm segment of esophageal wall thickening with a possible 14 mm right tracheoesophageal lymph node 2. Cirrhosis  3.    Solid dysphagia secondary to #1  4.     Hepatitis C  5.     Polysubstance abuse  6.     History of coronary artery disease  7.     Thrombocytopenia-likely secondary to cirrhosis, hepatitis C, and alcohol use  8.      Esophageal  varices  9.      Barrett's esophagus  10.    Report of "Numbness "and leg weakness    Disposition:   Mr. Buresh has been diagnosed with squamous cell of the esophagus. There is no evidence of metastatic disease based on his history, physical exam, and imaging studies to date. He will be referred for a staging PET scan. Mr. Maese has multiple comorbid conditions and will not be a candidate for an esophagectomy procedure.  He was seen by Dr. Lisbeth Renshaw today. The plan is to proceed with concurrent chemotherapy and radiation if the PET scan shows no evidence of metastatic disease.  Mr. Keating has baseline thrombocytopenia which will make it difficult to deliver carboplatin chemotherapy. I recommend 5-fluorouracil during the first and final week of radiation. We will check a platelet count when he returns for the chemotherapy teaching class and if the platelet count is significantly higher we will consider weekly  Taxol/carboplatin.  We reviewed the potential toxicities associated with 5-fluorouracil including the chance for nausea, mucositis, diarrhea, alopecia, rash, hyperpigmentation, and hematologic toxicity. He agrees to proceed. We discussed the potential for esophagitis with chemotherapy and radiation.  He will be referred for placement of a PICC prior to beginning chemotherapy on 05/21/2015.  Mr. Liwanag will return for an office visit and nadir CBC on 05/30/2015. He met with the [place] nutritionist today.  Approximately 50 minutes were spent with the patient today. The majority of the time was used for counseling and coordination of care.  [place], [place] 05/11/2015, 10:07 AM

## 2015-05-11 NOTE — Progress Notes (Signed)
Oncology Nurse Navigator Documentation  Oncology Nurse Navigator Flowsheets 05/11/2015  Referral date to RadOnc/MedOnc 05/07/2015  Navigator Encounter Type Clinic/MDC  Patient Visit Type Initial  Treatment Phase Treatment planning  Barriers/Navigation Needs Transportation;Education;Family concerns  Education Understanding Cancer/ Treatment Options;Pain Management;Coping with Diagnosis/ Prognosis;Newly Diagnosed Cancer Education;Preparing for Upcoming greatment;Transport During Treatment  Interventions Education Method  Education Method Verbal;Written--  Support Groups/Services GI;ACS for [place]  Time Spent with Patient 30  Met with patient during new patient clinic vsit. Explained the role of the GI Nurse Navigator and provided New Patient Packet with information on: 1. Esophageal cancer 2. Support groups 3. Advanced Directives 4. Fall Safety Plan Answered questions, reviewed current treatment plan and provided emotional support. Provided copy of current treatment plan. Patient was anxious to leave appointment-pacing in exam room. Declined to repeat treatment plan back to nurse. Minimal eye contact during entire encounter. Minimally responsive to questions. Will discuss his transportation issue when appointments are scheduled. Called Lurlean Leyden, [facility] liaison: he was seen in home twice and afterwards told them they were an "intrusion" at that time. He requested they call back next week. Plan is to contact him one more time to see if he will allow them in home.  Merceda Elks, RN, BSN GI Oncology [place]

## 2015-05-11 NOTE — Progress Notes (Signed)
Radiation Oncology         [phone]) [phone] ________________________________  Name: Patrick [name] MRN: [id number]  Date: 05/11/2015  DOB: 1950/08/24  CC:  Silvano Rusk, MD  REFERRING PHYSICIAN: Silvano Rusk, MD  DIAGNOSIS: The encounter diagnosis was Cancer of thoracic esophagus ([place]).   HISTORY OF PRESENT ILLNESS:Patrick [name] is a 64 y.o. male who is seen for an initial consultation visit regarding the patient's diagnosis of squamous cell cancer of the esophagus. The patient presented with symptoms of concern. Upon further evaluation of the esophageus a tumor was discovered. Biopsy was completed and confirmed cancer. His case was presented at conference to determine the best path of treatment in regards to the management of his disease. "Pain from my chest all the way down to my toes for months. It stays pretty much the same. It's hard to sleep. There all the time," the patient stated in regards to his pain. He reports difficulty swallowing and lack of appetite. To address this issue he avoids solid foods. When asked to described his pain he explained that it was everywhere "except my back, my arms, it's hard to put my shoes on." Lastly, "I don't take showers because I'm afraid to fall." The patient projected anxiety and was not accompanied by family. He required the assistance of a cane and wears a yellow fall risk bractlet. He is requesting pain medication today.  The patient notably was found to have an esophageal mass at 20 cm on endoscopy. This was preceded by an MRI scan of the thoracic region which demonstrated a area of thickeningwithin the esophagus.Biopsy returned positive for invasive squamous cell carcinoma. A PET scan is pending for further workup.    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     PAST SURGICAL HISTORY: Past Surgical History  Procedure Laterality Date  . Hernia repair      very young  . Balloon angioplasty, artery  03/2010  . Colonoscopy  11/11/11    Dr. Silvano Rusk  . Esophagogastroduodenoscopy  11/11/11    Dr. Silvano Rusk  . Cardiac catheterization  01/31/2011  . Coronary angioplasty    . Cataract extraction w/phaco  08/18/2012    Procedure: CATARACT EXTRACTION PHACO AND INTRAOCULAR LENS PLACEMENT (IOC);  Surgeon: Adonis Brook, MD;  Location: [facility];  Service: Ophthalmology;  Laterality: Right;  . Cataract extraction w/phaco Left 09/22/2012    Procedure: CATARACT EXTRACTION PHACO AND INTRAOCULAR LENS PLACEMENT (IOC);  Surgeon: Adonis Brook, MD;  Location: [facility];  Service: Ophthalmology;  Laterality: Left;  . Upper gastrointestinal endoscopy    . Esophagogastroduodenoscopy (egd) with propofol N/A 04/27/2015    Procedure: ESOPHAGOGASTRODUODENOSCOPY (EGD) WITH PROPOFOL;  Surgeon: Jerene Bears, MD;  Location: [facility] ENDOSCOPY;  Service: Endoscopy;  Laterality: N/A;     FAMILY HISTORY: family history includes Cancer in an other family member; Diabetes in his sister; Heart attack in his maternal grandfather; Stroke in his sister. There is no history of Colon cancer, Esophageal cancer, Rectal cancer, or Stomach cancer.   SOCIAL HISTORY:  reports that he has been smoking Cigarettes.  He has a 4.5 pack-year smoking history. He has never used smokeless tobacco. He reports that  he drinks alcohol. He reports that he uses illicit drugs.   ALLERGIES: Review of patient's allergies indicates no known allergies.   MEDICATIONS:  Current Outpatient Prescriptions  Medication Sig Dispense Refill    . aspirin 81 MG tablet Take 81 mg by mouth daily.     Marland Kitchen atorvastatin (LIPITOR) 40 MG tablet Take 1 tablet (40 mg total) by mouth daily at 6 PM. (Patient taking differently: Take 40 mg by mouth daily. ) 30 tablet 3  . carvedilol (COREG) 12.5 MG tablet Take 12.5 mg by mouth 2 (two) times daily with a meal.    . feeding supplement (BOOST / RESOURCE BREEZE) LIQD Take 1 Container by mouth 3 (three) times daily between meals. 90 Container 0  . folic acid (FOLVITE) 1 MG tablet Take 1 tablet (1 mg total) by mouth daily. 30 tablet 0  . furosemide (LASIX) 20 MG tablet Take 1 tablet as needed for fluid retention 30 tablet 3  . gabapentin (NEURONTIN) 300 MG capsule Take 300 mg by mouth at bedtime.     Marland Kitchen lisinopril (PRINIVIL,ZESTRIL) 20 MG tablet Take 20 mg by mouth daily.     . nitroGLYCERIN (NITROSTAT) 0.4 MG SL tablet Place 1 tablet (0.4 mg total) under the tongue every 5 (five) minutes as needed for chest pain. 25 tablet 2  . omeprazole (PRILOSEC) 20 MG capsule Take 1 capsule (20 mg total) by mouth 2 (two) times daily. 60 capsule 3  . oxyCODONE-acetaminophen (PERCOCET) 7.5-325 MG tablet Take 1 tablet by mouth every 6 (six) hours as needed for severe pain. 30 tablet 0  . polyethylene glycol (MIRALAX / GLYCOLAX) packet Take 17 g by mouth daily as needed.     . thiamine 100 MG tablet Take 1 tablet (100 mg total) by mouth daily. 30 tablet 0  . traMADol (ULTRAM) 50 MG tablet Take 50 mg by mouth every 12 (twelve) hours as needed for moderate pain.     Marland Kitchen triamcinolone cream (KENALOG) 0.1 % Apply 1 application topically 2 (two) times daily as needed (apply to rash).      No current facility-administered medications for this encounter.     REVIEW OF SYSTEMS:  A 15 point review of systems is documented in the electronic medical record. This was obtained by the nursing staff. However, I reviewed this with the patient to discuss relevant findings and make appropriate changes.  Pertinent items are noted in HPI.     PHYSICAL EXAM:  vitals were not taken for this visit.  General: Well-developed, in no acute distress HEENT: Normocephalic, atraumatic; oral cavity clear Neck: Supple without any lymphadenopathy Cardiovascular: Regular rate and rhythm Respiratory: Clear to auscultation bilaterally GI: Soft, nontender, normal bowel sounds Extremities: No edema present Neuro: No focal deficits    ECOG = 1  0 - Asymptomatic (Fully active, able to carry on all predisease activities without restriction)  1 - Symptomatic but completely ambulatory (Restricted in physically strenuous activity but ambulatory and able to carry out work of a light or sedentary nature. For example, light housework, office work)  2 - Symptomatic, <50% in bed during the day (Ambulatory and capable of all self care but unable to carry out any work activities. Up and about more than 50% of waking hours)  3 - Symptomatic, >50% in bed, but not bedbound (Capable of only limited self-care, confined to bed or chair 50% or more of waking hours)  4 - Bedbound (Completely disabled. Cannot carry on any self-care. Totally confined to bed or  chair)  5 - Death   Eustace Pen MM, Creech RH, Tormey DC, et al. [phone]). "Toxicity and response criteria of the [facility]". [place] Oncol. 5 (6): 649-55                                                                LABORATORY DATA:  Lab Results  Component Value Date   WBC 2.8* 04/30/2015   HGB 10.3* 04/30/2015   HCT 28.9* 04/30/2015   MCV 101.0* 04/30/2015   PLT 48* 04/30/2015   Lab Results  Component Value Date   NA 133* 04/30/2015   K 4.0 04/30/2015   CL 104 04/30/2015   CO2 24 04/30/2015   Lab Results  Component Value Date   ALT 25 04/29/2015   AST 77* 04/29/2015   ALKPHOS 112 04/29/2015   BILITOT 1.3* 04/29/2015      RADIOGRAPHY: Dg Ankle Complete Right  04/26/2015   CLINICAL DATA:  Severe right ankle pain for the past 3 months. No  known injury.  EXAM: RIGHT ANKLE - COMPLETE 3+ VIEW  COMPARISON:  None.  FINDINGS: Mild distal medial malleolus spur formation. Arterial calcifications and calcified phleboliths. No effusion seen.  IMPRESSION: Mild degenerative change.   Electronically Signed   By: Claudie Revering M.D.   On: 04/26/2015 15:19   Ct Head Wo Contrast  04/26/2015   CLINICAL DATA:  Initial evaluation for left lower extremity numbness.  EXAM: CT HEAD WITHOUT CONTRAST  TECHNIQUE: Contiguous axial images were obtained from the base of the skull through the vertex without intravenous contrast.  COMPARISON:  Prior study from 11/16/2010.  FINDINGS: There is no acute intracranial hemorrhage or infarct. No mass lesion or midline shift. Gray-white matter differentiation is well maintained. Ventricles are normal in size without evidence of hydrocephalus. CSF containing spaces are within normal limits. No extra-axial fluid collection. Generalized cerebral atrophy with mild chronic microvascular ischemic disease present. Scattered vascular calcifications within the carotid siphons.  The calvarium is intact.  Orbital soft tissues are within normal limits.  The paranasal sinuses and mastoid air cells are well pneumatized and free of fluid.  Scalp soft tissues are unremarkable.  IMPRESSION: 1. No acute intracranial process. 2. Generalized age-related cerebral atrophy with chronic microvascular ischemic disease.   Electronically Signed   By: Jeannine Boga M.D.   On: 04/26/2015 04:19   Mr Thoracic Spine W Wo Contrast  04/26/2015   CLINICAL DATA:  64 year old male with numbness from the chest down for several months, progressively increasing. Generalized pain in weakness. Unintentional weight loss x3 months. Initial encounter.  EXAM: MRI THORACIC SPINE WITHOUT AND WITH CONTRAST  TECHNIQUE: Multiplanar and multiecho pulse sequences of the thoracic spine were obtained without and with intravenous contrast.  CONTRAST:  15 mL MultiHance  COMPARISON:   Chest radiograph 03/02/2015.  FINDINGS: Widespread nonspecific decreased bone marrow signal in the visible skeleton. However, no thoracic compression fracture, thoracic vertebral destructive osseous lesion or marrow edema.  Thoracic spinal canal is patent without stenosis. No abnormal intradural enhancement. Thoracic spinal cord signal appears within normal limits. The conus medullaris appears normal at the T12 level.  Negative visualized posterior paraspinal soft tissues. The thoracic inlet is reported below. Elsewhere the visualized mediastinal structures appear within normal limits. Lung parenchyma  remarkable for bilateral lower lobe bronchiectasis and/or peribronchial increased signal. Negative visualized upper abdominal viscera.  At the thoracic inlet, the level of the thyroid, there is a nearly 4.5 cm segment of severe circumferential esophageal wall thickening (series 9, image 6, series 11, image 10, series 7, image 6). The wall thickening is up to 10 mm. The lumen in this region appears irregular on series 11, image 10. There is diffuse enhancement of the thickened wall. To the right of the trachea. In the right tracheoesophageal groove there is a 14 mm T1 and T2 hyperintense nodule which might enhance, suspicious for lymphadenopathy.  IMPRESSION: 1. 4 cm segment of abnormal esophageal wall thickening at the thoracic inlet, suspicious for esophageal carcinoma. Possible enlarged 14 mm lymph node in the right tracheoesophageal groove. Recommend endoscopy. 2. Abnormal bone marrow signal diffusely, without destructive lesion or marrow edema in the thoracic spine. Query anemia causing red marrow reactivation. Otherwise metastatic disease to bone cannot be excluded. 3. No thoracic spinal stenosis, cord compression, or thoracic spinal cord abnormality identified to explain the sensory changes.   Electronically Signed   By: Genevie Ann M.D.   On: 04/26/2015 14:33   US Abdomen Complete  04/26/2015   CLINICAL DATA:   Abdominal pain for several months.  Pancreatitis.  EXAM: ULTRASOUND ABDOMEN COMPLETE  COMPARISON:  None.  FINDINGS: Gallbladder: No gallstones or wall thickening visualized. Sonographic Murphy sign indeterminate due to pain medication.  Common bile duct: Diameter: 4 mm  Liver: No focal lesion identified. Coarse echogenic liver with poor sonic penetration compatible with diffuse hepatic steatosis.  IVC: No abnormality visualized.  Pancreas: Borderline dilatation of the dorsal pancreatic duct at 3 mm. Pancreatic head poorly seen.  Spleen: Size and appearance within normal limits.  Right Kidney: Length: 6.0 cm. Severely atrophic/dysplastic right kidney, with several associated calcifications.  Left Kidney: Length: 13.3 cm. Echogenicity within normal limits. No mass or hydronephrosis visualized.  Abdominal aorta: No aneurysm visualized. Marginal atherosclerotic calcification.  Other findings: None.  IMPRESSION: 1. Borderline dilatation of the dorsal pancreatic duct. The pancreatic head is poorly seen. Pancreatic body and tail appear otherwise unremarkable. No pseudocyst identified. 2. Severely atrophic/dysplastic right kidney. Compensatory hypertrophy of the left kidney. 3. Coarse echogenic liver with poor sonic penetration compatible with diffuse hepatic steatosis.   Electronically Signed   By: Van Clines M.D.   On: 04/26/2015 08:43   Mr 3d Recon At Scanner  04/28/2015   CLINICAL DATA:  Pancreatitis (likely secondary to ETOH), abdominal pain, borderline dilatation of pancreatic duct  EXAM: MRI ABDOMEN WITHOUT AND WITH CONTRAST (INCLUDING MRCP)  TECHNIQUE: Multiplanar multisequence MR imaging of the abdomen was performed both before and after the administration of intravenous contrast. Heavily T2-weighted images of the biliary and pancreatic ducts were obtained, and three-dimensional MRCP images were rendered by post processing.  CONTRAST:  1mL MULTIHANCE GADOBENATE DIMEGLUMINE 529 MG/ML IV SOLN  COMPARISON:   Abdominal ultrasound dated 04/26/2015  FINDINGS: Motion degraded images.  Lower chest:  Lung bases are clear.  Hepatobiliary: Liver is within normal limits. No suspicious/enhancing hepatic lesions. No hepatic steatosis.  Gallbladder is unremarkable. No intrahepatic or extrahepatic ductal dilatation.  No cholelithiasis or choledocholithiasis is seen.  Pancreas: Peripancreatic fluid/ inflammatory changes along the pancreatic head/ uncinate process and pancreatic tail, compatible with acute pancreatitis. No drainable fluid collection/pseudocyst. No evidence of pancreatic mass. Pancreatic duct is within the upper limits of normal.  Spleen: Within normal limits.  Adrenals/Urinary Tract: Adrenal glands are within normal limits.  6 mm left upper pole renal cyst (series 8/image 30). Left kidney is otherwise within normal limits. No hydronephrosis.  Severely atrophic right kidney (series 8/ image 33). No hydronephrosis.  Stomach/Bowel: Stomach and visualized bowel is grossly unremarkable.  Vascular/Lymphatic: No evidence of abdominal aortic aneurysm.  No suspicious abdominal lymphadenopathy.  Other: Small volume retroperitoneal/abdominal ascites.  Musculoskeletal: No focal osseous lesions.  IMPRESSION: Motion degraded images.  Acute pancreatitis.  No drainable fluid collection/pseudocyst.  No evidence of pancreatic mass. Pancreatic duct is within the upper limits of normal.  No intrahepatic or extrahepatic ductal dilatation. No cholelithiasis or choledocholithiasis is seen.  Severely atrophic right kidney.   Electronically Signed   By: Julian Hy M.D.   On: 04/28/2015 10:24   Mr Abd W/wo Cm/mrcp  04/28/2015   CLINICAL DATA:  Pancreatitis (likely secondary to ETOH), abdominal pain, borderline dilatation of pancreatic duct  EXAM: MRI ABDOMEN WITHOUT AND WITH CONTRAST (INCLUDING MRCP)  TECHNIQUE: Multiplanar multisequence MR imaging of the abdomen was performed both before and after the administration of intravenous  contrast. Heavily T2-weighted images of the biliary and pancreatic ducts were obtained, and three-dimensional MRCP images were rendered by post processing.  CONTRAST:  40mL MULTIHANCE GADOBENATE DIMEGLUMINE 529 MG/ML IV SOLN  COMPARISON:  Abdominal ultrasound dated 04/26/2015  FINDINGS: Motion degraded images.  Lower chest:  Lung bases are clear.  Hepatobiliary: Liver is within normal limits. No suspicious/enhancing hepatic lesions. No hepatic steatosis.  Gallbladder is unremarkable. No intrahepatic or extrahepatic ductal dilatation.  No cholelithiasis or choledocholithiasis is seen.  Pancreas: Peripancreatic fluid/ inflammatory changes along the pancreatic head/ uncinate process and pancreatic tail, compatible with acute pancreatitis. No drainable fluid collection/pseudocyst. No evidence of pancreatic mass. Pancreatic duct is within the upper limits of normal.  Spleen: Within normal limits.  Adrenals/Urinary Tract: Adrenal glands are within normal limits.  6 mm left upper pole renal cyst (series 8/image 30). Left kidney is otherwise within normal limits. No hydronephrosis.  Severely atrophic right kidney (series 8/ image 33). No hydronephrosis.  Stomach/Bowel: Stomach and visualized bowel is grossly unremarkable.  Vascular/Lymphatic: No evidence of abdominal aortic aneurysm.  No suspicious abdominal lymphadenopathy.  Other: Small volume retroperitoneal/abdominal ascites.  Musculoskeletal: No focal osseous lesions.  IMPRESSION: Motion degraded images.  Acute pancreatitis.  No drainable fluid collection/pseudocyst.  No evidence of pancreatic mass. Pancreatic duct is within the upper limits of normal.  No intrahepatic or extrahepatic ductal dilatation. No cholelithiasis or choledocholithiasis is seen.  Severely atrophic right kidney.   Electronically Signed   By: Julian Hy M.D.   On: 04/28/2015 10:24       IMPRESSION: Jahzion Brogden is a 64 year old male presenting to clinic in regards to his squamous cell  cancer of the esophagus. The patient is recovering well from his most recent surgery. Treatment options moving forward include radiation therapy treatments and chemotherapy. The patient understands the risks, benefits, purpose, and differences between chemotherapy and radiation therapy. The patient was briefly educated on the traditional process he will experience during radiation therapy treatments as a patient, including the purpose and definition of a CT scan, simulation planning session, and the appropriate time frame to set aside daily for treatments. He understands the appropriate time frame to set aside for treatment. He vocalized difficulty managing current reported symptoms. The patient understands that if skin irritations, heart burn, or irritation of the esophagus occurs, medications will be offered as a solution to alleviate these symptoms.The patient understands that he can access his appointments and  medical records via [facility]. The patient understands the purpose and necessity of completing the recommended PET scan before receiving radiation treatment. Surgery is not recommended at this time. He seemed overwhelmed.    PLAN: A PET scan will be completed as scheduled before radiation therapy treatments begin. The patient will proceed with radiation therapy treatments. The patient was educated on common symptoms to expect during radiation treatment and healthy methods of management to address these symptoms if they are to occur. He is advised of his CT scan and simulation planning session to take place as scheduled with radiation therapy treatments to follow. If he experiences further weight loss, additional resources will be provided to help manage his weight and nutrition throughout treatment. If skin irritations, heart burn, or irritation of the esophagus occurs, medications will be offered as a solution to alleviate these symptoms as needed. He is aware of his follow-up appointment with radiation  oncology to take place as scheduled and to notify Dr. Lisbeth Renshaw, MD if any symptoms of concern occur. The patient is also advised of his follow-up appointment with radiation oncology to take place as scheduled. All vocalized questions and concerns have been addressed. If the patient develops any further questions or concerns in regards to his treatment and recovery, he has been encouraged to contact Dr. Lisbeth Renshaw, MD.  The patient appears to be a good candidate for chemoradiation treatment.  This was the recommendation from multidisciplinary GI conference. I would anticipate a 6 week course Of radiation treatment. The patient at this time does not appear to be a very good surgical candidate.I would therefore favor treating the patient to a dose of 54 gray with concurrent chemotherapy.  The patient will be scheduled for a simulation in the near future such that we can proceed with treatment planning.     This document serves as a record of services personally performed by Kyung Rudd, MD. It was created on his behalf by Lenn Cal, a trained medical scribe. The creation of this record is based on the scribe's personal observations and the provider's statements to them. This document has been checked and approved by the attending provider.  ________________________________   Jodelle Gross, MD, PhD   **Disclaimer: This note was dictated with voice recognition software. Similar sounding words can inadvertently be transcribed and this note may contain transcription errors which may not have been corrected upon publication of note.**

## 2015-05-11 NOTE — Telephone Encounter (Signed)
lvm for pt regarding to OCT appts.

## 2015-05-11 NOTE — Telephone Encounter (Signed)
Per staff message and POF I have scheduled appts. Advised scheduler of appts and first available given. JMW  

## 2015-05-11 NOTE — Progress Notes (Signed)
[facility] Psychosocial Distress Screening Clinical Social Work  Clinical Social Work met with pt at [facility] to introduce self, explain role of CSW and review distress screening protocol. Pt alone today and reports to live alone in his apartment. He has few family and was brought to clinic today by his mother's friend. Clinical Social Worker discussed common emotions experienced by newly diagnosed patients, resources to assist and how to contact CSW/Patient and Family Support Team as needed. The patient scored a 10 on the Psychosocial Distress Thermometer which indicates severe distress. Clinical Social Worker reviewed distress screen and patient reports he has had issues with depression his "whole life". He reports he has never received counseling or been on medication to assist and is not currently open to intervention. CSW reviewed supports available at home to assist as pt lives alone and was to begin PT through [facility]. He reports it is to start next week. He is not open to additional supports in the home and is only open to PT at home. Pt stated his goal was to remain at home if at all possible.  CSW reviewed chart and h/o substance abuse. Pt requesting to go home, appeared in pain. CSW did not explore this issue with pt, but this could become a barrier to treatment. Pt aware of CSW support to assist.   ONCBCN DISTRESS SCREENING 05/11/2015  Screening Type Initial Screening  Distress experienced in past week (1-10) 10  Emotional problem type Depression;Adjusting to illness;Isolation/feeling alone;Feeling hopeless;Boredom;Adjusting to appearance changes  Information Concerns Type Lack of info about diagnosis  Physical Problem type Pain;Getting around;Loss of appetitie;Tingling hands/feet;Swollen arms/legs  Physician notified of physical symptoms Yes  Referral to clinical social work Yes  Referral to dietition Yes  Referral to support programs Yes    Clinical Social Worker follow up needed:  Yes.    If yes, follow up plan: CSW to follow Loren Racer, [place]  [facility] Phone: [phone] Fax: [phone]

## 2015-05-14 ENCOUNTER — Encounter: Payer: Self-pay | Admitting: *Deleted

## 2015-05-14 NOTE — Progress Notes (Signed)
PET scan scheduled for 10/21; per Manuela Schwartz GI Navigator, PET scan needs to be done by 10/13. Left message with Elta Guadeloupe, radiology supervisor, as central scheduling states they are unable to move this appt up.

## 2015-05-15 NOTE — Progress Notes (Signed)
05/15/15-Attempted to call [facility] with radiology regarding moving PET scan up to be done by 10/13. No response at this time. Left second message with call back number.

## 2015-05-16 ENCOUNTER — Ambulatory Visit: Payer: Medicare HMO | Admitting: Neurology

## 2015-05-16 ENCOUNTER — Telehealth: Payer: Self-pay | Admitting: *Deleted

## 2015-05-16 NOTE — Telephone Encounter (Signed)
Left VM reminding him of chemo class/lab appointment on 10/13 at 10:00. Will not have PICC placed or chemo this week since PET scan will not be done until 10/21. Will try to reschedule for 05/28/15 per Dr. Benay Spice. Requested he call back if he has any difficulty getting to his appointment tomorrow.

## 2015-05-17 ENCOUNTER — Encounter: Payer: Self-pay | Admitting: *Deleted

## 2015-05-17 ENCOUNTER — Other Ambulatory Visit: Payer: Medicare HMO

## 2015-05-17 ENCOUNTER — Ambulatory Visit (HOSPITAL_COMMUNITY): Payer: Medicare HMO

## 2015-05-17 ENCOUNTER — Other Ambulatory Visit (HOSPITAL_BASED_OUTPATIENT_CLINIC_OR_DEPARTMENT_OTHER): Payer: Medicare HMO

## 2015-05-17 ENCOUNTER — Ambulatory Visit: Payer: Medicare HMO

## 2015-05-17 ENCOUNTER — Other Ambulatory Visit: Payer: Self-pay | Admitting: *Deleted

## 2015-05-17 DIAGNOSIS — C153 Malignant neoplasm of upper third of esophagus: Secondary | ICD-10-CM | POA: Diagnosis not present

## 2015-05-17 DIAGNOSIS — D696 Thrombocytopenia, unspecified: Secondary | ICD-10-CM

## 2015-05-17 DIAGNOSIS — C159 Malignant neoplasm of esophagus, unspecified: Secondary | ICD-10-CM

## 2015-05-17 LAB — CBC WITH DIFFERENTIAL/PLATELET
BASO%: 0.6 % (ref 0.0–2.0)
BASOS ABS: 0 10*3/uL (ref 0.0–0.1)
EOS%: 1.1 % (ref 0.0–7.0)
Eosinophils Absolute: 0 10*3/uL (ref 0.0–0.5)
HEMATOCRIT: 35.9 % — AB (ref 38.4–49.9)
HEMOGLOBIN: 12.1 g/dL — AB (ref 13.0–17.1)
LYMPH#: 1.6 10*3/uL (ref 0.9–3.3)
LYMPH%: 43.6 % (ref 14.0–49.0)
MCH: 36.1 pg — ABNORMAL HIGH (ref 27.2–33.4)
MCHC: 33.8 g/dL (ref 32.0–36.0)
MCV: 106.7 fL — ABNORMAL HIGH (ref 79.3–98.0)
MONO#: 0.2 10*3/uL (ref 0.1–0.9)
MONO%: 6.5 % (ref 0.0–14.0)
NEUT#: 1.8 10*3/uL (ref 1.5–6.5)
NEUT%: 48.2 % (ref 39.0–75.0)
PLATELETS: 91 10*3/uL — AB (ref 140–400)
RBC: 3.37 10*6/uL — ABNORMAL LOW (ref 4.20–5.82)
RDW: 18.8 % — ABNORMAL HIGH (ref 11.0–14.6)
WBC: 3.7 10*3/uL — ABNORMAL LOW (ref 4.0–10.3)

## 2015-05-17 LAB — COMPREHENSIVE METABOLIC PANEL (CC13)
ALT: 22 U/L (ref 0–55)
AST: 62 U/L — ABNORMAL HIGH (ref 5–34)
Albumin: 2.7 g/dL — ABNORMAL LOW (ref 3.5–5.0)
Alkaline Phosphatase: 164 U/L — ABNORMAL HIGH (ref 40–150)
Anion Gap: 9 mEq/L (ref 3–11)
BILIRUBIN TOTAL: 0.71 mg/dL (ref 0.20–1.20)
BUN: <4 mg/dL — ABNORMAL LOW (ref 7.0–26.0)
CHLORIDE: 105 meq/L (ref 98–109)
CO2: 26 meq/L (ref 22–29)
Calcium: 8.9 mg/dL (ref 8.4–10.4)
Creatinine: 0.8 mg/dL (ref 0.7–1.3)
GLUCOSE: 99 mg/dL (ref 70–140)
POTASSIUM: 3.4 meq/L — AB (ref 3.5–5.1)
SODIUM: 139 meq/L (ref 136–145)
TOTAL PROTEIN: 7.6 g/dL (ref 6.4–8.3)

## 2015-05-18 ENCOUNTER — Telehealth: Payer: Self-pay | Admitting: *Deleted

## 2015-05-18 ENCOUNTER — Other Ambulatory Visit: Payer: Self-pay | Admitting: *Deleted

## 2015-05-18 DIAGNOSIS — C154 Malignant neoplasm of middle third of esophagus: Secondary | ICD-10-CM

## 2015-05-18 NOTE — Telephone Encounter (Signed)
FYI Call from patient's sister Patrick [name] from [place], [place]., [phone]).  "Would like call from Dr. Benay Spice to explain what is going on.  He is weak, went to the class but couldn't remember anything when he talked to me.  What chemotherapy is needed and what do we need to do as a family because he lives alone, should not drive, will need transportation help.  Our mother lives in [place] with nurse help around the clock.  The rest of the family is in [place].  Our dream is for him to come here but it's not his or mom's.  What is needed for him."  This nurse advised Patrick [name] of possible side effects and needs for what appears to be planned for [facility].  Plan for PICC which needs line care for planned 28-day cycle of a 96 hr 5FU pump and radiation, cumulative side effects.  Answered question about possible resources as [employer], [employer], [employer].  Encouraged to call if any other questions.

## 2015-05-18 NOTE — Telephone Encounter (Signed)
Return call from [place] w[facility]. He has declined home PT saying he was seen and is being set up for outpatient PT-made her aware that he was not seen by PT in clinic since he already had home care orders. The RN has seen him and plans to see him again today. She will communicate to him to return our calls regarding his appointments and follow up.

## 2015-05-18 NOTE — Telephone Encounter (Signed)
Radiation oncology has been trying to reach patient to schedule his SIM without success. Called his contact # and left "urgent" VM for him to call navigator to talk about his pain control, appointments, thoughts after the chemo class yesterday and if he wishes to try for transportation assistance for his appointments. Left VM for [place] asking for status of their most recent attempt to see him for home nurse/PT. Also left VM for CSW to inquire what would be best route for him to take for transportation?

## 2015-05-20 ENCOUNTER — Other Ambulatory Visit: Payer: Self-pay | Admitting: Oncology

## 2015-05-21 ENCOUNTER — Telehealth: Payer: Self-pay | Admitting: *Deleted

## 2015-05-21 DIAGNOSIS — C159 Malignant neoplasm of esophagus, unspecified: Secondary | ICD-10-CM

## 2015-05-21 NOTE — Telephone Encounter (Signed)
Patient had called back and left VM for nurse to call him back-he had just spoken with his sister. This RN called back and was not able to reach him-left my direct # for his phone pager since mailbox is full.  Radiation oncology will be able to do his SIM on 10/19 at 1:45/2:00pm if he can be reached(spoke with Aaron Edelman).

## 2015-05-21 NOTE — Telephone Encounter (Signed)
  Oncology Nurse Navigator Documentation    Navigator Encounter Type: Telephone (05/21/15 1239): Unable to reach patient; "mailbox full". Left my direct phone # on his phone.l spoke with his sister, Lorn Junes about attempts to reach him to discuss his chemo class; transportation needs; per MD may be able to tx weekly w/Taxol & Carbo if his platelets hold instead of the pump. Need to confirm he will come for his PET scan on 10/21 and that radiation oncology needs to talk with him about the simulation appointment they want to schedule for 10/26.   Treatment Phase: Treatment (05/21/15 1239) Barriers/Navigation Needs: Family concerns (05/21/15 1239) Education: Preparing for Upcoming Surgery/ Treatment;Transport During Treatment;Pain/ Symptom Management (05/21/15 1239) Interventions: Other (05/21/15 1239) : Spoke with sister, who will have his mom's care assistant go to his home and check on him and try to get him; if not will leave note in door for him to contact office. She is concerned he may be drinking again, which is also my concern. Sister wants to bring him to [place] to be treated and return home afterwards since he lives alone. Agreed this would be the ideal situation for him at this time. She doubts he would agree.     Education Method: Verbal (05/21/15 1239) Made sister again aware of transportation services in [place]-this issue could be solved. Just need to ensure he eats and can manage his symptoms.     Time Spent with Patient: 15 (05/21/15 1239): Spent time with sister. Will continue to try and reach [facility].

## 2015-05-21 NOTE — Telephone Encounter (Signed)
Patient called asking "Do I have an appointment with Dr. Benay Spice this week?  Do I have to wait until my appointment for a refill?  I need my pain medication refilled."   Will notify provider of ocycodone-APAP refill request..  Informed him he has to arrive at 12:30 to register at [facility], NPO 6 hours before or PET.  Request to speak with Navigator.  Call transferred to [phone].   Return number for pick up [phone] or mom Romilda Garret [phone].

## 2015-05-22 ENCOUNTER — Telehealth: Payer: Self-pay | Admitting: *Deleted

## 2015-05-22 MED ORDER — OXYCODONE-ACETAMINOPHEN 7.5-325 MG PO TABS: 1.0000 | ORAL_TABLET | Freq: Four times a day (QID) | ORAL | Status: DC | PRN

## 2015-05-22 NOTE — Telephone Encounter (Signed)
Made call to his mother and she has no way to reach him except by the phone # we have. She will make him aware of his Friday appointment if she sees him. May see if her caregiver can check on him.

## 2015-05-22 NOTE — Telephone Encounter (Addendum)
Call from patient requesting oxycodone refill at [phone].

## 2015-05-22 NOTE — Telephone Encounter (Signed)
Message from pt requesting refill on Oxycodone. Returned call to inform him Rx is ready for pick up. He voiced understanding.

## 2015-05-22 NOTE — Telephone Encounter (Signed)
  Oncology Nurse Navigator Documentation    Navigator Encounter Type: Telephone (05/22/15 1139): Mailbox still "full". Left my return # on pager.

## 2015-05-22 NOTE — Telephone Encounter (Signed)
Still not able to reach [facility]. Called his sister and left VM requesting she try to reach him again to call the office.

## 2015-05-23 ENCOUNTER — Encounter: Payer: Self-pay | Admitting: *Deleted

## 2015-05-23 ENCOUNTER — Telehealth: Payer: Self-pay | Admitting: *Deleted

## 2015-05-23 NOTE — Telephone Encounter (Signed)
Message from pt requesting to speak with Manuela Schwartz, GI navigator to clarify appointments. Noted Manuela Schwartz had been trying to reach patient. Returned call, confirmed PET appointment on vm, left message requesting he call back to discuss.

## 2015-05-23 NOTE — Progress Notes (Signed)
[place] Work  Clinical Social Work was referred by patient navigator for transportation needs assessment of psychosocial needs.  Clinical Social Worker contacted patient at home to offer support and assess for needs.  Patient reported he did not have transportation to his appointment on Friday 10/21.  Patient has [facility] and Medicaid, both of which offer a transportation benefit.  CSW and patient dicussed the different transportation options.  CSW provided patient with the contact information to schedule transportation through his [place] insurance benefit [phone]).  They will provide up to 24 one way trips per year.  Patient was calling today and will report back to CSW once confirmed.  If patient is unable to arrange transportation CSW will address and provide appropriate interventions.  CSW will also provide a SCAT application at patients appointment on Friday.  This will provide transportation throughout the treatment process.              Johnnye Lana, MSW, LCSW, OSW-C Clinical Social Worker [facility] [phone]

## 2015-05-24 ENCOUNTER — Encounter: Payer: Self-pay | Admitting: *Deleted

## 2015-05-24 NOTE — Progress Notes (Signed)
[place] Work  Clinical Social Work confirmed pt has arranged transportation through [employer] for his upcoming appointment on 05/25/15. Pt has transportation to and from said appointment.   Loren Racer, [place] Worker [employer]  [place] Phone: [phone] Fax: [phone]

## 2015-05-25 ENCOUNTER — Telehealth: Payer: Self-pay | Admitting: *Deleted

## 2015-05-25 ENCOUNTER — Ambulatory Visit (HOSPITAL_COMMUNITY)
Admission: RE | Admit: 2015-05-25 | Discharge: 2015-05-25 | Disposition: A | Discharge status: Home / Self Care | Payer: Medicare HMO | Source: Ambulatory Visit | Attending: Oncology | Admitting: Oncology

## 2015-05-25 ENCOUNTER — Encounter: Payer: Self-pay | Admitting: *Deleted

## 2015-05-25 DIAGNOSIS — I6523 Occlusion and stenosis of bilateral carotid arteries: Secondary | ICD-10-CM | POA: Insufficient documentation

## 2015-05-25 DIAGNOSIS — I2510 Atherosclerotic heart disease of native coronary artery without angina pectoris: Secondary | ICD-10-CM | POA: Diagnosis not present

## 2015-05-25 DIAGNOSIS — C159 Malignant neoplasm of esophagus, unspecified: Secondary | ICD-10-CM

## 2015-05-25 DIAGNOSIS — I701 Atherosclerosis of renal artery: Secondary | ICD-10-CM | POA: Diagnosis not present

## 2015-05-25 DIAGNOSIS — K766 Portal hypertension: Secondary | ICD-10-CM | POA: Insufficient documentation

## 2015-05-25 DIAGNOSIS — N200 Calculus of kidney: Secondary | ICD-10-CM | POA: Insufficient documentation

## 2015-05-25 DIAGNOSIS — I517 Cardiomegaly: Secondary | ICD-10-CM | POA: Diagnosis not present

## 2015-05-25 DIAGNOSIS — K7460 Unspecified cirrhosis of liver: Secondary | ICD-10-CM | POA: Insufficient documentation

## 2015-05-25 DIAGNOSIS — G319 Degenerative disease of nervous system, unspecified: Secondary | ICD-10-CM | POA: Insufficient documentation

## 2015-05-25 DIAGNOSIS — N261 Atrophy of kidney (terminal): Secondary | ICD-10-CM | POA: Diagnosis not present

## 2015-05-25 DIAGNOSIS — N289 Disorder of kidney and ureter, unspecified: Secondary | ICD-10-CM | POA: Diagnosis not present

## 2015-05-25 LAB — GLUCOSE, CAPILLARY: Glucose-Capillary: 88 mg/dL (ref 65–99)

## 2015-05-25 MED ORDER — FLUDEOXYGLUCOSE F - 18 (FDG) INJECTION
8.6200 | Freq: Once | INTRAVENOUS | Status: DC | PRN
  Administered 2015-05-25: 8.62 via INTRAVENOUS
  Filled 2015-05-25: qty 8.62

## 2015-05-25 NOTE — Telephone Encounter (Signed)
Left VM for patient that his PET scan was negative for any spread of cancer. Just in the area we already knew about.

## 2015-05-25 NOTE — Progress Notes (Signed)
Oncology Nurse Navigator Documentation  Oncology Nurse Navigator Flowsheets 05/25/2015  Referral date to RadOnc/MedOnc -  Navigator Encounter Type Follow Up-  Patient Visit Type Radiology-PET  Treatment Phase Treatment planning  Barriers/Navigation Needs Family concerns  Education Transport During Treatment;Understanding Cancer/ Treatment Options  Interventions Transportations  Education Method Verbal  Support Groups/Services -  Time Spent with Patient 10  Met with Demaree in radiology today to review his appointments for Wednesday for simulation/labs/see Dr. Benay Spice. Provided copy of calendar. He understands and agrees. Informed him that if his platelet count is still up, Dr. Benay Spice can do the weekly chemo and he won't need the infusion pump. Made him aware that his transportation assistance from [facility] will only cover 24 one way rides, so he needs to work on getting accepted for SCAT as back up plan. He agrees and signed the application.Aplication returned to [place]. Reports fatigue and that he can only walk about 100 feet without resting. Stairs are difficult due to feet swelling and pain. Ambulates with a cane. Gait is not as steady due to pain and takes pain medication. Says the bus stop is over 100 yards from his apartment and he can't get there without much difficulty.

## 2015-05-28 ENCOUNTER — Encounter: Payer: Self-pay | Admitting: *Deleted

## 2015-05-28 NOTE — Progress Notes (Signed)
[place] Work  Holiday representative reviewed and completed SCAT application.  Application was faxed and submitted.    Johnnye Lana, MSW, LCSW, OSW-C Clinical Social Worker [facility] [phone]

## 2015-05-30 ENCOUNTER — Ambulatory Visit
Admission: RE | Admit: 2015-05-30 | Discharge: 2015-05-30 | Disposition: A | Discharge status: Home / Self Care | Payer: Medicare HMO | Source: Ambulatory Visit | Attending: Radiation Oncology | Admitting: Radiation Oncology

## 2015-05-30 ENCOUNTER — Other Ambulatory Visit (HOSPITAL_BASED_OUTPATIENT_CLINIC_OR_DEPARTMENT_OTHER): Payer: Medicare HMO

## 2015-05-30 ENCOUNTER — Encounter: Payer: Self-pay | Admitting: *Deleted

## 2015-05-30 ENCOUNTER — Ambulatory Visit (HOSPITAL_BASED_OUTPATIENT_CLINIC_OR_DEPARTMENT_OTHER): Payer: Medicare HMO | Admitting: Nurse Practitioner

## 2015-05-30 ENCOUNTER — Telehealth: Payer: Self-pay | Admitting: Nurse Practitioner

## 2015-05-30 VITALS — BP 139/104 | HR 129 | Temp 98.3°F | Resp 18 | Ht 69.0 in | Wt 150.6 lb

## 2015-05-30 DIAGNOSIS — Z510 Encounter for antineoplastic radiation therapy: Secondary | ICD-10-CM | POA: Diagnosis not present

## 2015-05-30 DIAGNOSIS — C154 Malignant neoplasm of middle third of esophagus: Secondary | ICD-10-CM | POA: Diagnosis present

## 2015-05-30 DIAGNOSIS — D6959 Other secondary thrombocytopenia: Secondary | ICD-10-CM

## 2015-05-30 DIAGNOSIS — B1920 Unspecified viral hepatitis C without hepatic coma: Secondary | ICD-10-CM

## 2015-05-30 DIAGNOSIS — K7030 Alcoholic cirrhosis of liver without ascites: Secondary | ICD-10-CM

## 2015-05-30 DIAGNOSIS — C153 Malignant neoplasm of upper third of esophagus: Secondary | ICD-10-CM | POA: Diagnosis not present

## 2015-05-30 DIAGNOSIS — Z7982 Long term (current) use of aspirin: Secondary | ICD-10-CM | POA: Diagnosis not present

## 2015-05-30 LAB — CBC WITH DIFFERENTIAL/PLATELET
BASO%: 0.5 % (ref 0.0–2.0)
BASOS ABS: 0 10*3/uL (ref 0.0–0.1)
EOS ABS: 0 10*3/uL (ref 0.0–0.5)
EOS%: 0.8 % (ref 0.0–7.0)
HCT: 37 % — ABNORMAL LOW (ref 38.4–49.9)
HEMOGLOBIN: 13.1 g/dL (ref 13.0–17.1)
LYMPH#: 1.5 10*3/uL (ref 0.9–3.3)
LYMPH%: 40.4 % (ref 14.0–49.0)
MCH: 35.8 pg — ABNORMAL HIGH (ref 27.2–33.4)
MCHC: 35.4 g/dL (ref 32.0–36.0)
MCV: 101.1 fL — ABNORMAL HIGH (ref 79.3–98.0)
MONO#: 0.4 10*3/uL (ref 0.1–0.9)
MONO%: 11.2 % (ref 0.0–14.0)
NEUT%: 47.1 % (ref 39.0–75.0)
NEUTROS ABS: 1.8 10*3/uL (ref 1.5–6.5)
NRBC: 0 % (ref 0–0)
Platelets: 75 10*3/uL — ABNORMAL LOW (ref 140–400)
RBC: 3.66 10*6/uL — AB (ref 4.20–5.82)
RDW: 18.1 % — AB (ref 11.0–14.6)
WBC: 3.8 10*3/uL — AB (ref 4.0–10.3)

## 2015-05-30 NOTE — Telephone Encounter (Signed)
per pof to sch pt appt-sent [place] email tosch pt trmt-gave pt avs

## 2015-05-30 NOTE — Patient Instructions (Signed)
Carboplatin injection What is this medicine? CARBOPLATIN (KAR boe pla tin) is a chemotherapy drug. It targets fast dividing cells, like cancer cells, and causes these cells to die. This medicine is used to treat ovarian cancer and many other cancers. This medicine may be used for other purposes; ask your health care provider or pharmacist if you have questions. What should I tell my health care provider before I take this medicine? They need to know if you have any of these conditions: -blood disorders -hearing problems -kidney disease -recent or ongoing radiation therapy -an unusual or allergic reaction to carboplatin, cisplatin, other chemotherapy, other medicines, foods, dyes, or preservatives -pregnant or trying to get pregnant -breast-feeding How should I use this medicine? This drug is usually given as an infusion into a vein. It is administered in a hospital or clinic by a specially trained health care professional. Talk to your pediatrician regarding the use of this medicine in children. Special care may be needed. Overdosage: If you think you have taken too much of this medicine contact a poison control center or emergency room at once. NOTE: This medicine is only for you. Do not share this medicine with others. What if I miss a dose? It is important not to miss a dose. Call your doctor or health care professional if you are unable to keep an appointment. What may interact with this medicine? -medicines for seizures -medicines to increase blood counts like filgrastim, pegfilgrastim, sargramostim -some antibiotics like amikacin, gentamicin, neomycin, streptomycin, tobramycin -vaccines Talk to your doctor or health care professional before taking any of these medicines: -acetaminophen -aspirin -ibuprofen -ketoprofen -naproxen This list may not describe all possible interactions. Give your health care provider a list of all the medicines, herbs, non-prescription drugs, or dietary  supplements you use. Also tell them if you smoke, drink alcohol, or use illegal drugs. Some items may interact with your medicine. What should I watch for while using this medicine? Your condition will be monitored carefully while you are receiving this medicine. You will need important blood work done while you are taking this medicine. This drug may make you feel generally unwell. This is not uncommon, as chemotherapy can affect healthy cells as well as cancer cells. Report any side effects. Continue your course of treatment even though you feel ill unless your doctor tells you to stop. In some cases, you may be given additional medicines to help with side effects. Follow all directions for their use. Call your doctor or health care professional for advice if you get a fever, chills or sore throat, or other symptoms of a cold or flu. Do not treat yourself. This drug decreases your body's ability to fight infections. Try to avoid being around people who are sick. This medicine may increase your risk to bruise or bleed. Call your doctor or health care professional if you notice any unusual bleeding. Be careful brushing and flossing your teeth or using a toothpick because you may get an infection or bleed more easily. If you have any dental work done, tell your dentist you are receiving this medicine. Avoid taking products that contain aspirin, acetaminophen, ibuprofen, naproxen, or ketoprofen unless instructed by your doctor. These medicines may hide a fever. Do not become pregnant while taking this medicine. Women should inform their doctor if they wish to become pregnant or think they might be pregnant. There is a potential for serious side effects to an unborn child. Talk to your health care professional or pharmacist for more information.   Do not breast-feed an infant while taking this medicine. What side effects may I notice from receiving this medicine? Side effects that you should report to your  doctor or health care professional as soon as possible: -allergic reactions like skin rash, itching or hives, swelling of the face, lips, or tongue -signs of infection - fever or chills, cough, sore throat, pain or difficulty passing urine -signs of decreased platelets or bleeding - bruising, pinpoint red spots on the skin, black, tarry stools, nosebleeds -signs of decreased red blood cells - unusually weak or tired, fainting spells, lightheadedness -breathing problems -changes in hearing -changes in vision -chest pain -high blood pressure -low blood counts - This drug may decrease the number of white blood cells, red blood cells and platelets. You may be at increased risk for infections and bleeding. -nausea and vomiting -pain, swelling, redness or irritation at the injection site -pain, tingling, numbness in the hands or feet -problems with balance, talking, walking -trouble passing urine or change in the amount of urine Side effects that usually do not require medical attention (report to your doctor or health care professional if they continue or are bothersome): -hair loss -loss of appetite -metallic taste in the mouth or changes in taste This list may not describe all possible side effects. Call your doctor for medical advice about side effects. You may report side effects to FDA at 1-800-FDA-1088. Where should I keep my medicine? This drug is given in a hospital or clinic and will not be stored at home. NOTE: This sheet is a summary. It may not cover all possible information. If you have questions about this medicine, talk to your doctor, pharmacist, or health care provider.    2016, Elsevier/Gold Standard. (2007-10-26 14:38:05) Paclitaxel injection What is this medicine? PACLITAXEL (PAK li TAX el) is a chemotherapy drug. It targets fast dividing cells, like cancer cells, and causes these cells to die. This medicine is used to treat ovarian cancer, breast cancer, and other  cancers. This medicine may be used for other purposes; ask your health care provider or pharmacist if you have questions. What should I tell my health care provider before I take this medicine? They need to know if you have any of these conditions: -blood disorders -irregular heartbeat -infection (especially a virus infection such as chickenpox, cold sores, or herpes) -liver disease -previous or ongoing radiation therapy -an unusual or allergic reaction to paclitaxel, alcohol, polyoxyethylated castor oil, other chemotherapy agents, other medicines, foods, dyes, or preservatives -pregnant or trying to get pregnant -breast-feeding How should I use this medicine? This drug is given as an infusion into a vein. It is administered in a hospital or clinic by a specially trained health care professional. Talk to your pediatrician regarding the use of this medicine in children. Special care may be needed. Overdosage: If you think you have taken too much of this medicine contact a poison control center or emergency room at once. NOTE: This medicine is only for you. Do not share this medicine with others. What if I miss a dose? It is important not to miss your dose. Call your doctor or health care professional if you are unable to keep an appointment. What may interact with this medicine? Do not take this medicine with any of the following medications: -disulfiram -metronidazole This medicine may also interact with the following medications: -cyclosporine -diazepam -ketoconazole -medicines to increase blood counts like filgrastim, pegfilgrastim, sargramostim -other chemotherapy drugs like cisplatin, doxorubicin, epirubicin, etoposide, teniposide, vincristine -quinidine -testosterone -  vaccines -verapamil Talk to your doctor or health care professional before taking any of these medicines: -acetaminophen -aspirin -ibuprofen -ketoprofen -naproxen This list may not describe all possible  interactions. Give your health care provider a list of all the medicines, herbs, non-prescription drugs, or dietary supplements you use. Also tell them if you smoke, drink alcohol, or use illegal drugs. Some items may interact with your medicine. What should I watch for while using this medicine? Your condition will be monitored carefully while you are receiving this medicine. You will need important blood work done while you are taking this medicine. This drug may make you feel generally unwell. This is not uncommon, as chemotherapy can affect healthy cells as well as cancer cells. Report any side effects. Continue your course of treatment even though you feel ill unless your doctor tells you to stop. This medicine can cause serious allergic reactions. To reduce your risk you will need to take other medicine(s) before treatment with this medicine. In some cases, you may be given additional medicines to help with side effects. Follow all directions for their use. Call your doctor or health care professional for advice if you get a fever, chills or sore throat, or other symptoms of a cold or flu. Do not treat yourself. This drug decreases your body's ability to fight infections. Try to avoid being around people who are sick. This medicine may increase your risk to bruise or bleed. Call your doctor or health care professional if you notice any unusual bleeding. Be careful brushing and flossing your teeth or using a toothpick because you may get an infection or bleed more easily. If you have any dental work done, tell your dentist you are receiving this medicine. Avoid taking products that contain aspirin, acetaminophen, ibuprofen, naproxen, or ketoprofen unless instructed by your doctor. These medicines may hide a fever. Do not become pregnant while taking this medicine. Women should inform their doctor if they wish to become pregnant or think they might be pregnant. There is a potential for serious side  effects to an unborn child. Talk to your health care professional or pharmacist for more information. Do not breast-feed an infant while taking this medicine. Men are advised not to father a child while receiving this medicine. This product may contain alcohol. Ask your pharmacist or healthcare provider if this medicine contains alcohol. Be sure to tell all healthcare providers you are taking this medicine. Certain medicines, like metronidazole and disulfiram, can cause an unpleasant reaction when taken with alcohol. The reaction includes flushing, headache, nausea, vomiting, sweating, and increased thirst. The reaction can last from 30 minutes to several hours. What side effects may I notice from receiving this medicine? Side effects that you should report to your doctor or health care professional as soon as possible: -allergic reactions like skin rash, itching or hives, swelling of the face, lips, or tongue -low blood counts - This drug may decrease the number of white blood cells, red blood cells and platelets. You may be at increased risk for infections and bleeding. -signs of infection - fever or chills, cough, sore throat, pain or difficulty passing urine -signs of decreased platelets or bleeding - bruising, pinpoint red spots on the skin, black, tarry stools, nosebleeds -signs of decreased red blood cells - unusually weak or tired, fainting spells, lightheadedness -breathing problems -chest pain -high or low blood pressure -mouth sores -nausea and vomiting -pain, swelling, redness or irritation at the injection site -pain, tingling, numbness in the hands or   feet -slow or irregular heartbeat -swelling of the ankle, feet, hands Side effects that usually do not require medical attention (report to your doctor or health care professional if they continue or are bothersome): -bone pain -complete hair loss including hair on your head, underarms, pubic hair, eyebrows, and eyelashes -changes in  the color of fingernails -diarrhea -loosening of the fingernails -loss of appetite -muscle or joint pain -red flush to skin -sweating This list may not describe all possible side effects. Call your doctor for medical advice about side effects. You may report side effects to FDA at 1-800-FDA-1088. Where should I keep my medicine? This drug is given in a hospital or clinic and will not be stored at home. NOTE: This sheet is a summary. It may not cover all possible information. If you have questions about this medicine, talk to your doctor, pharmacist, or health care provider.    2016, Elsevier/Gold Standard. (2015-03-08 13:02:56)  

## 2015-05-30 NOTE — Progress Notes (Signed)
Oncology Nurse Navigator Documentation  Oncology Nurse Navigator Flowsheets 05/30/2015  Referral date to RadOnc/MedOnc -  Navigator Encounter Type Treatment planning  Patient Visit Type Medonc  Treatment Phase Treatment planning  Barriers/Navigation Needs Education-  Education Understanding treatment options;SCAT procedure;Nutrition-hydration  Interventions Referrals  Referrals Nutrition/dietician  Education Method Verbal;Written;Teach-back  Support Groups/Services -  Time Spent with Patient 30  Starting to have trouble with some solids. Obtained samples of supplements for him to try and can let dietician know which he liked best for future. Encouraged him to eat soft food and push the oral fluids. Needs to avoid alcohol and smoking for his benefit. Gave him letter that he was approved for SCAT after his [place] transportation is exhausted and how to reach them( see letter). Reviewed potential side effects of the Taxol/Carboplatin he will be getting weekly. He expresses that he wants to get well and is willing to have RT/chemo. Will request dietician see him with 1st treatment.

## 2015-05-30 NOTE — Progress Notes (Addendum)
[facility] OFFICE PROGRESS NOTE   Diagnosis:  Esophagus cancer  INTERVAL HISTORY:   Mr. Sheils returns as scheduled. He has some difficulty swallowing solids. No difficulty with liquids. Appetite is poor. He has lost weight. He denies shortness of breath. No significant cough. No fever. Bowels moving regularly. He has baseline numbness in the hands and feet. He reports bilateral leg pain.  He did not take either of his blood pressure medications today.   Objective:  Vital signs in last 24 hours:  Blood pressure 139/104, pulse 129, temperature 98.3 F (36.8 C), temperature source Oral, resp. rate 18, height 5\' 9"  (1.753 m), weight 150 lb 9.6 oz (68.312 kg), SpO2 100 %.    HEENT: White coating over tongue. No ulcers. Mucous membranes appear moist. Lymphatics: No palpable cervical or supra-clavicular lymph nodes. Soft fullness left supra clavicular region. Resp: Lungs clear bilaterally. Cardio: Regular, tachycardic. GI: Abdomen soft, diffusely tender over the upper abdomen. No hepatomegaly. No mass. Vascular: Trace lower leg edema bilaterally.    Lab Results:  Lab Results  Component Value Date   WBC 3.8* 05/30/2015   HGB 13.1 05/30/2015   HCT 37.0* 05/30/2015   MCV 101.1* 05/30/2015   PLT 75* 05/30/2015   NEUTROABS 1.8 05/30/2015    Imaging:  No results found.  Medications: I have reviewed the patient's current medications.  Assessment/Plan: 1. 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 No evidence of hypermetabolic nodal or distant metastasis. 2. Cirrhosis 3. Solid dysphagia secondary to  #1 4. Hepatitis C 5. Polysubstance abuse 6. History of coronary artery disease 7. Thrombocytopenia-likely secondary to cirrhosis, hepatitis C, and alcohol use 8. Esophageal varices 9. Barrett's esophagus 10. Report of "Numbness" and leg weakness    Disposition: Mr. Kanouse has been diagnosed with squamous cell carcinoma of the esophagus. There is no evidence of metastatic disease on the staging PET scan. He is not a candidate for an esophagectomy procedure due to multiple comorbid conditions.  Dr. Benay Spice recommends proceeding with concurrent chemotherapy and radiation.  He has baseline thrombocytopenia likely due to cirrhosis, hepatitis C and alcohol use. The platelet count was higher on 05/17/2015 and today. Dr. Benay Spice recommends weekly Taxol/carboplatin during the course of radiation. We reviewed potential toxicities including myelosuppression, allergic reaction, nausea, mouth sores, diarrhea or constipation, rash. We discussed the potential for neuropathy with Taxol. Mr. Tuccillo is agreeable to proceed. He is scheduled to begin the course of radiation on 06/11/2015. We will plan for the first weekly Taxol/carboplatin on 06/11/2015 as well.  His blood pressure and heart rate were both elevated in the office today. He did not take either of his blood pressure medications today. He will take them as soon as he returns home.  At present he is tolerating liquids without difficulty. He is losing weight. We will arrange for an appointment with Ernestene Kiel, [facility] dietitian.  Mr. Mullane will return for a follow-up visit in one week.  Patient seen with Dr. Benay Spice. 25 minutes were spent face-to-face at today's visit with the majority of that time involved in counseling/coordination of care.      Ned Card ANP/GNP-BC   05/30/2015  2:14  PM  This was a shared visit with Ned Card. The platelet count has been higher over the past few weeks. We decided to proceed with weekly  Taxol/carboplatin chemotherapy with close follow-up of the CBC. Reviewed the potential toxicities associated with this regimen. Mr. Chalk agrees to proceed. He will return for an office visit next week to be sure his performance status is adequate to beginning chemotherapy.  Julieanne Manson, M.D.

## 2015-05-30 NOTE — Progress Notes (Signed)
Pt. States feet,ankle,knee pain

## 2015-06-04 ENCOUNTER — Other Ambulatory Visit: Payer: Self-pay | Admitting: Oncology

## 2015-06-04 ENCOUNTER — Telehealth: Payer: Self-pay | Admitting: *Deleted

## 2015-06-04 NOTE — Telephone Encounter (Signed)
-----   Message from Ladell Pier, MD sent at 05/25/2015  4:30 PM EDT ----- Please call patient, PET shows no metastatic disease, plan to proceed with radiation and chemotherapy

## 2015-06-04 NOTE — Telephone Encounter (Signed)
Left non-descriptive VM for return call to discuss PET scan results

## 2015-06-05 NOTE — Telephone Encounter (Signed)
Message from pt on 10/31 @ 1641 asking for clarification of voicemail, thought message was to schedule PET. Called pt today, no answer. Left message informing him we will discuss PET RESULTS during visit on 11/2.

## 2015-06-06 ENCOUNTER — Telehealth: Payer: Self-pay | Admitting: Oncology

## 2015-06-06 ENCOUNTER — Other Ambulatory Visit (HOSPITAL_BASED_OUTPATIENT_CLINIC_OR_DEPARTMENT_OTHER): Payer: Medicare HMO

## 2015-06-06 ENCOUNTER — Encounter: Payer: Self-pay | Admitting: *Deleted

## 2015-06-06 ENCOUNTER — Ambulatory Visit (HOSPITAL_BASED_OUTPATIENT_CLINIC_OR_DEPARTMENT_OTHER): Payer: Medicare HMO | Admitting: Nurse Practitioner

## 2015-06-06 VITALS — BP 114/81 | HR 105 | Temp 98.2°F | Resp 18 | Ht 69.0 in | Wt 151.2 lb

## 2015-06-06 DIAGNOSIS — K7030 Alcoholic cirrhosis of liver without ascites: Secondary | ICD-10-CM

## 2015-06-06 DIAGNOSIS — B182 Chronic viral hepatitis C: Secondary | ICD-10-CM

## 2015-06-06 DIAGNOSIS — D6959 Other secondary thrombocytopenia: Secondary | ICD-10-CM

## 2015-06-06 DIAGNOSIS — M79604 Pain in right leg: Secondary | ICD-10-CM

## 2015-06-06 DIAGNOSIS — C154 Malignant neoplasm of middle third of esophagus: Secondary | ICD-10-CM

## 2015-06-06 DIAGNOSIS — R1310 Dysphagia, unspecified: Secondary | ICD-10-CM

## 2015-06-06 DIAGNOSIS — M79605 Pain in left leg: Secondary | ICD-10-CM

## 2015-06-06 DIAGNOSIS — C159 Malignant neoplasm of esophagus, unspecified: Secondary | ICD-10-CM | POA: Diagnosis not present

## 2015-06-06 LAB — COMPREHENSIVE METABOLIC PANEL (CC13)
ALBUMIN: 2.6 g/dL — AB (ref 3.5–5.0)
ALK PHOS: 232 U/L — AB (ref 40–150)
ALT: 25 U/L (ref 0–55)
ANION GAP: 7 meq/L (ref 3–11)
AST: 85 U/L — ABNORMAL HIGH (ref 5–34)
BILIRUBIN TOTAL: 0.58 mg/dL (ref 0.20–1.20)
BUN: 4.2 mg/dL — ABNORMAL LOW (ref 7.0–26.0)
CO2: 27 meq/L (ref 22–29)
Calcium: 8.7 mg/dL (ref 8.4–10.4)
Chloride: 107 mEq/L (ref 98–109)
Creatinine: 0.8 mg/dL (ref 0.7–1.3)
Glucose: 109 mg/dl (ref 70–140)
Potassium: 3.8 mEq/L (ref 3.5–5.1)
Sodium: 141 mEq/L (ref 136–145)
TOTAL PROTEIN: 7.3 g/dL (ref 6.4–8.3)

## 2015-06-06 LAB — CBC WITH DIFFERENTIAL/PLATELET
BASO%: 0.8 % (ref 0.0–2.0)
BASOS ABS: 0 10*3/uL (ref 0.0–0.1)
EOS ABS: 0.1 10*3/uL (ref 0.0–0.5)
EOS%: 1.3 % (ref 0.0–7.0)
HCT: 36.7 % — ABNORMAL LOW (ref 38.4–49.9)
HGB: 12.3 g/dL — ABNORMAL LOW (ref 13.0–17.1)
LYMPH%: 36.8 % (ref 14.0–49.0)
MCH: 36.2 pg — AB (ref 27.2–33.4)
MCHC: 33.5 g/dL (ref 32.0–36.0)
MCV: 108.1 fL — AB (ref 79.3–98.0)
MONO#: 0.3 10*3/uL (ref 0.1–0.9)
MONO%: 8.4 % (ref 0.0–14.0)
NEUT%: 52.7 % (ref 39.0–75.0)
NEUTROS ABS: 2.2 10*3/uL (ref 1.5–6.5)
PLATELETS: 103 10*3/uL — AB (ref 140–400)
RBC: 3.39 10*6/uL — AB (ref 4.20–5.82)
RDW: 20.6 % — ABNORMAL HIGH (ref 11.0–14.6)
WBC: 4.1 10*3/uL (ref 4.0–10.3)
lymph#: 1.5 10*3/uL (ref 0.9–3.3)

## 2015-06-06 MED ORDER — DEXAMETHASONE 4 MG PO TABS: ORAL_TABLET | ORAL | Status: DC

## 2015-06-06 MED ORDER — PROCHLORPERAZINE MALEATE 10 MG PO TABS: 10.0000 mg | ORAL_TABLET | Freq: Four times a day (QID) | ORAL | Status: DC | PRN

## 2015-06-06 NOTE — Progress Notes (Addendum)
  [facility] OFFICE PROGRESS NOTE   Diagnosis:  Esophagus cancer  INTERVAL HISTORY:   Patrick [name] returns as scheduled. He is tolerating liquids without difficulty. He continues to have trouble with solids. No pain with swallowing. His main complaint continues to be bilateral lower leg/ankle/foot pain. This has been ongoing for many months. 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 He has intermittent leg numbness as well. No back pain.  Objective:  Vital signs in last 24 hours:  There were no vitals taken for this visit.    HEENT: No thrush or ulcers. Lymphatics: No palpable cervical or supra-clavicular lymph nodes. Resp: Lungs clear bilaterally. Cardio: Regular rate and rhythm. GI: Abdomen is soft, diffuse mild tenderness with palpation. No hepatomegaly. No mass. Vascular: Very minimal bilateral pretibial edema. Calves nontender.    Lab Results:  Lab Results  Component Value Date   WBC 4.1 06/06/2015   HGB 12.3* 06/06/2015   HCT 36.7* 06/06/2015   MCV 108.1* 06/06/2015   PLT 103* 06/06/2015   NEUTROABS 2.2 06/06/2015    Imaging:  No results found.  Medications: I have reviewed the patient's current medications.  Assessment/Plan: 1. 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 No evidence of hypermetabolic nodal or distant metastasis. 2. Cirrhosis 3. Solid dysphagia secondary to #1 4. Hepatitis C 5. Polysubstance abuse 6. History of coronary artery disease 7. Thrombocytopenia-likely secondary to cirrhosis, hepatitis C, and alcohol use 8. Esophageal varices 9. Barrett's esophagus 10. Report  of "Numbness" and leg pain   Disposition: Patrick [name] appears stable. He continues to have dysphagia to solids. He is tolerating liquids. The platelet count continues to be improved. The plan is to proceed with concurrent chemotherapy/radiation beginning 06/11/2015. We again reviewed potential toxicities associated with Taxol/carboplatin. Prescriptions were sent to his pharmacy for dexamethasone 10 mg the night prior to the first chemotherapy and Compazine 10 mg every 6 hours as needed for nausea.  The etiology of the leg pain/numbness is unclear. We feel it is unlikely the leg pain is related to his cancer diagnosis. We made a referral to neurology, question peripheral neuropathy. He will obtain refills on his pain medication from his primary care provider.  We will see Patrick [name] in follow-up on 06/18/2015 prior to the second weekly Taxol/carboplatin. He will contact the office in the interim with any problems.  Patient seen with Dr. Benay Spice.    Ned Card ANP/GNP-BC   06/06/2015  11:44 AM  This was a shared visit with Ned Card. The plan is to begin chemotherapy and radiation on 06/11/2015. We made a neurology referral to evaluate his leg pain. This is most likely unrelated to the esophagus cancer.  Julieanne Manson, M.D.

## 2015-06-06 NOTE — Progress Notes (Signed)
Oncology Nurse Navigator Documentation  Oncology Nurse Navigator Flowsheets 05/25/2015 05/30/2015 06/06/2015  Referral date to RadOnc/MedOnc - - -  Navigator Encounter Type Other Treatment Follow up  Patient Visit Type - Medonc Medonc  Treatment Phase Treatment Treatment Tx planning-review  Barriers/Navigation Needs Family concerns Education Family concerns--chronic pain  Education Transport During Treatment;Understanding Cancer/ Treatment Options Understanding Cancer/ Treatment Options;Other Needs to follow up with his PCP regarding the pain-unrelated to cancer  Interventions Transportations Referrals Coordination of Care-chemo appointment had not been scheduled. Consulted w/chemo scheduler and obtained appointment.  Referrals - Nutrition/dietician -LIked the chocolate Ensure "ok"-will request samples for Monday  Education Method Verbal Verbal;Written;Teach-back -Verbal  Support Groups/Services - - -  Time Spent with Patient 15 30 15

## 2015-06-06 NOTE — Telephone Encounter (Signed)
Gave adn pritned appt sched and avs for pt for NOV and Jan 2017...the patient sched to see Dr. Posey Pronto 1.6 @ 10am

## 2015-06-08 ENCOUNTER — Emergency Department (HOSPITAL_COMMUNITY)
Admission: EM | Admit: 2015-06-08 | Discharge: 2015-06-08 | Disposition: A | Discharge status: Home / Self Care | Payer: Medicare HMO | Attending: Emergency Medicine | Admitting: Emergency Medicine

## 2015-06-08 ENCOUNTER — Encounter (HOSPITAL_COMMUNITY): Payer: Self-pay | Admitting: Family Medicine

## 2015-06-08 ENCOUNTER — Telehealth: Payer: Self-pay | Admitting: *Deleted

## 2015-06-08 DIAGNOSIS — E785 Hyperlipidemia, unspecified: Secondary | ICD-10-CM | POA: Diagnosis not present

## 2015-06-08 DIAGNOSIS — I10 Essential (primary) hypertension: Secondary | ICD-10-CM | POA: Diagnosis not present

## 2015-06-08 DIAGNOSIS — I2510 Atherosclerotic heart disease of native coronary artery without angina pectoris: Secondary | ICD-10-CM | POA: Diagnosis not present

## 2015-06-08 DIAGNOSIS — Z7982 Long term (current) use of aspirin: Secondary | ICD-10-CM | POA: Insufficient documentation

## 2015-06-08 DIAGNOSIS — M79605 Pain in left leg: Secondary | ICD-10-CM | POA: Diagnosis not present

## 2015-06-08 DIAGNOSIS — G8929 Other chronic pain: Secondary | ICD-10-CM | POA: Diagnosis not present

## 2015-06-08 DIAGNOSIS — Z720 Tobacco use: Secondary | ICD-10-CM | POA: Insufficient documentation

## 2015-06-08 DIAGNOSIS — M1990 Unspecified osteoarthritis, unspecified site: Secondary | ICD-10-CM | POA: Insufficient documentation

## 2015-06-08 DIAGNOSIS — Z8619 Personal history of other infectious and parasitic diseases: Secondary | ICD-10-CM | POA: Insufficient documentation

## 2015-06-08 DIAGNOSIS — M79604 Pain in right leg: Secondary | ICD-10-CM | POA: Insufficient documentation

## 2015-06-08 DIAGNOSIS — K219 Gastro-esophageal reflux disease without esophagitis: Secondary | ICD-10-CM | POA: Insufficient documentation

## 2015-06-08 DIAGNOSIS — Z79899 Other long term (current) drug therapy: Secondary | ICD-10-CM | POA: Insufficient documentation

## 2015-06-08 DIAGNOSIS — I252 Old myocardial infarction: Secondary | ICD-10-CM | POA: Insufficient documentation

## 2015-06-08 MED ORDER — OXYCODONE-ACETAMINOPHEN 5-325 MG PO TABS
1.0000 | ORAL_TABLET | Freq: Once | ORAL | Status: AC
  Administered 2015-06-08: 1 via ORAL
  Filled 2015-06-08: qty 1

## 2015-06-08 MED ORDER — GABAPENTIN 300 MG PO CAPS: 300.0000 mg | ORAL_CAPSULE | Freq: Three times a day (TID) | ORAL | Status: DC

## 2015-06-08 NOTE — ED Notes (Signed)
Pt here for bilateral leg pain. Seen here for the same recently and out of pain meds. Some swelling in legs

## 2015-06-08 NOTE — Telephone Encounter (Signed)
  Oncology Nurse Navigator Documentation    Navigator Encounter Type: Telephone (06/08/15 1349) : Returned call to patient and left voice mail to call back if still needed. Had called 11/3 and left message that he has questions about transportation.

## 2015-06-08 NOTE — Care Management Note (Signed)
Case Management Note  Patient Details  Name: Patrick [name] MRN: [id number] Date of Birth: 1950-10-30  Subjective/Objective:                  Patient is a 64 y.o. male presenting with leg pain.//Home alone  Action/Plan: Follow for disposition needs.   Expected Discharge Date:        06/08/15          Expected Discharge Plan:  Home/Self Care  In-House Referral:  NA  Discharge planning Services  CM Consult  Post Acute Care Choice:  NA Choice offered to:  NA  DME Arranged:  N/A DME Agency:  NA  HH Arranged:  NA HH Agency:  NA  Status of Service:  Completed, signed off  Medicare Important Message Given:    Date Medicare IM Given:    Medicare IM give by:    Date Additional Medicare IM Given:    Additional Medicare Important Message give by:     If discussed at [place] of Stay Meetings, dates discussed:    Additional Comments: NCM consulted to assist pt with pain management options.  NCM called several pain mangement clinics to find one that accepted pt insurance.  [facility] is an accepting provider, of which the pt was given address and phone number.  NCM will fax appropriate information to [facility] asap. Fuller Mandril, RN 06/08/2015, 10:59 AM

## 2015-06-08 NOTE — ED Provider Notes (Signed)
CSN: [id number]     Arrival date & time 06/08/15  3491 History   First MD Initiated Contact with Patient 06/08/15 [phone]     Chief Complaint  Patient presents with  . Leg Pain     (Consider location/radiation/quality/duration/timing/severity/associated sxs/prior Treatment) Patient is a 64 y.o. male presenting with leg pain.  Leg Pain Location:  Leg Time since incident:  3 months Injury: no   Leg location:  R lower leg and L lower leg Pain details:    Quality:  Tingling, sharp and burning   Radiates to:  Does not radiate   Severity:  Severe   Onset quality:  Gradual   Duration:  3 months   Timing:  Intermittent   Progression:  Unchanged Chronicity:  Chronic Relieved by: narcotics. Worsened by:  Bearing weight and activity Ineffective treatments:  Rest Associated symptoms: decreased ROM, numbness, swelling and tingling   Associated symptoms: no back pain and no fever   Risk factors: no concern for non-accidental trauma     Past Medical History  Diagnosis Date  . MI (myocardial infarction) ([place])     x3, jan, aug, nov 2011; treated at [facility] in [place]  . Sexual dysfunction   . HTN (hypertension)   . Hyperlipidemia   . Chronic chest pain   . CAD (coronary artery disease)   . Thrombocytopenia ([place])   . SSBE (short-segment Barrett's esophagus) 11/11/11  . Colon adenomas 11/11/11    x 5, one rectal tv adenoma  . GERD (gastroesophageal reflux disease)   . Chronic hepatitis C with cirrhosis ([place]) 01/02/2012  . Barrett's esophagus 11/17/2011    Short-segment dx 11/2011   . Arthritis     knees  . Cataract     bil removed  . Anemia   . Alcoholism ([place]) 01/02/2012    cocaine abuse, herion abuse in past   Past Surgical History  Procedure Laterality Date  . Hernia repair      very young  . Balloon angioplasty, artery  03/2010  . Colonoscopy  11/11/11    Dr. Silvano Rusk  . Esophagogastroduodenoscopy  11/11/11    Dr. Silvano Rusk  . Cardiac catheterization  01/31/2011  . Coronary  angioplasty    . Cataract extraction w/phaco  08/18/2012    Procedure: CATARACT EXTRACTION PHACO AND INTRAOCULAR LENS PLACEMENT (IOC);  Surgeon: Adonis Brook, MD;  Location: [facility];  Service: Ophthalmology;  Laterality: Right;  . Cataract extraction w/phaco Left 09/22/2012    Procedure: CATARACT EXTRACTION PHACO AND INTRAOCULAR LENS PLACEMENT (IOC);  Surgeon: Adonis Brook, MD;  Location: [facility];  Service: Ophthalmology;  Laterality: Left;  . Upper gastrointestinal endoscopy    . Esophagogastroduodenoscopy (egd) with propofol N/A 04/27/2015    Procedure: ESOPHAGOGASTRODUODENOSCOPY (EGD) WITH PROPOFOL;  Surgeon: Jerene Bears, MD;  Location: [facility] ENDOSCOPY;  Service: Endoscopy;  Laterality: N/A;   Family History  Problem Relation Age of Onset  . Cancer      mother's side/fathers side, 2 uncles  . Heart attack Maternal Grandfather   . Diabetes Sister   . Stroke Sister   . Colon cancer Neg Hx   . Esophageal cancer Neg Hx   . Rectal cancer Neg Hx   . Stomach cancer Neg Hx    Social History  Substance Use Topics  . Smoking status: Current Some Day Smoker -- 0.10 packs/day for 45 years    Types: Cigarettes  . Smokeless tobacco: Never Used     Comment: 1-2 cig/day  . Alcohol Use: Yes  Comment: Pt reports drinking 2 bottles of wine tonight. "I drink a 5th of wine daily".    Review of Systems  Constitutional: Negative for fever.  HENT: Negative for facial swelling.   Respiratory: Negative for shortness of breath.   Cardiovascular: Negative for chest pain.  Gastrointestinal: Negative for abdominal pain.  Genitourinary: Negative for dysuria.  Musculoskeletal: Positive for arthralgias. Negative for back pain.  Skin: Negative for rash.  Neurological: Positive for numbness. Negative for weakness and headaches.  Psychiatric/Behavioral: Negative for confusion.      Allergies  Review of patient's allergies indicates no known allergies.  Home Medications   Prior to Admission medications    Medication Sig Start Date End Date Taking? Authorizing Provider  aspirin 81 MG tablet Take 81 mg by mouth daily.    Yes Historical Provider, MD  atorvastatin (LIPITOR) 40 MG tablet Take 1 tablet (40 mg total) by mouth daily at 6 PM. Patient taking differently: Take 40 mg by mouth daily.  11/30/12  Yes Timmothy Euler, MD  carvedilol (COREG) 12.5 MG tablet Take 12.5 mg by mouth 2 (two) times daily with a meal. 09/05/12  Yes Brooke O Edmisten, PA-C  feeding supplement (BOOST / RESOURCE BREEZE) LIQD Take 1 Container by mouth 3 (three) times daily between meals. 04/30/15  Yes Thurnell Lose, MD  folic acid (FOLVITE) 1 MG tablet Take 1 tablet (1 mg total) by mouth daily. 04/30/15  Yes Thurnell Lose, MD  furosemide (LASIX) 20 MG tablet Take 1 tablet as needed for fluid retention Patient taking differently: Take 20 mg by mouth as needed for fluid. Take 1 tablet as needed for fluid retention 04/02/15  Yes Brittainy M Simmons, PA-C  lisinopril (PRINIVIL,ZESTRIL) 20 MG tablet Take 20 mg by mouth daily.    Yes Historical Provider, MD  nitroGLYCERIN (NITROSTAT) 0.4 MG SL tablet Place 1 tablet (0.4 mg total) under the tongue every 5 (five) minutes as needed for chest pain. 03/03/15  Yes Brittainy Erie Noe, PA-C  oxyCODONE-acetaminophen (PERCOCET) 7.5-325 MG tablet Take 1 tablet by mouth every 6 (six) hours as needed for severe pain. 05/22/15  Yes Ladell Pier, MD  polyethylene glycol [facility] / GLYCOLAX) packet Take 17 g by mouth daily as needed for mild constipation.  03/23/15  Yes Historical Provider, MD  prochlorperazine (COMPAZINE) 10 MG tablet Take 1 tablet (10 mg total) by mouth every 6 (six) hours as needed for nausea or vomiting. 06/06/15  Yes Owens Shark, NP  thiamine 100 MG tablet Take 1 tablet (100 mg total) by mouth daily. 04/30/15  Yes Thurnell Lose, MD  traMADol (ULTRAM) 50 MG tablet Take 50 mg by mouth every 12 (twelve) hours as needed for moderate pain.  03/22/15  Yes Historical Provider, MD   triamcinolone cream (KENALOG) 0.1 % Apply 1 application topically 2 (two) times daily as needed (apply to rash).  04/14/15  Yes Historical Provider, MD  dexamethasone (DECADRON) 4 MG tablet Take 10 mg (2.5 tabs) the night prior to first chemotherapy 06/06/15   Owens Shark, NP  gabapentin (NEURONTIN) 300 MG capsule Take 1 capsule (300 mg total) by mouth 3 (three) times daily. Start with 1 pill at night and increase to 1 pill 3 times per day as tolerated. 06/08/15   Hoyle Sauer, MD  omeprazole (PRILOSEC) 20 MG capsule Take 1 capsule (20 mg total) by mouth 2 (two) times daily. Patient not taking: Reported on 06/08/2015 03/08/15   Ladene Artist, MD   BP  121/81 mmHg  Pulse 87  Temp(Src) 98.3 F (36.8 C)  Resp 18  SpO2 98% Physical Exam  Constitutional: He is oriented to person, place, and time. He appears well-developed and well-nourished. No distress.  HENT:  Head: Normocephalic and atraumatic.  Right Ear: External ear normal.  Left Ear: External ear normal.  Nose: Nose normal.  Mouth/Throat: Oropharynx is clear and moist. No oropharyngeal exudate.  Eyes: Conjunctivae and EOM are normal. Pupils are equal, round, and reactive to light. Right eye exhibits no discharge. Left eye exhibits no discharge. No scleral icterus.  Neck: Normal range of motion. Neck supple. No JVD present. No tracheal deviation present. No thyromegaly present.  Cardiovascular: Normal rate, regular rhythm and intact distal pulses.   Pulmonary/Chest: Effort normal. No stridor. No respiratory distress.  Abdominal: Soft. He exhibits no distension.  Musculoskeletal: Normal range of motion. He exhibits no edema or tenderness.  Lymphadenopathy:    He has no cervical adenopathy.  Neurological: He is alert and oriented to person, place, and time. He displays no atrophy and no tremor. A sensory deficit is present. He exhibits normal muscle tone. He displays no seizure activity. GCS eye subscore is 4. GCS verbal subscore is 5.  GCS motor subscore is 6.  Pt walks with a cane  Skin: Skin is warm and dry. Petechiae noted. No rash noted. He is not diaphoretic. No erythema. No pallor.  LE peteichiae from prior vasculitis.  Stable.  Skin is waxy, but non-erythematous.  Note reynaud changes to bilateral hands.  Psychiatric: He has a normal mood and affect. His behavior is normal. Judgment and thought content normal.  Nursing note and vitals reviewed.   ED Course  Procedures (including critical care time)  Recent progress notes reviewed.  No additional w/u or testing indicated at this time.  MDM   Final diagnoses:  Leg pain, bilateral    Pt with hx of neuropathic symptoms in B/L LEs following a reported "vasculitis".  Pt also has a hx of reynauds, esophageal CA, and prior substance abuse.  Pt reports that he is supposed to start chemotherapy for his esophagus on Monday next week.  Pt states he cannot sleep because of leg pain.  Pain has been ongoing for months.  AF, VSS.  No other apparent cause for his pain which appears neuropathic.  He received narcotics from PCP, but states they don't want to write him for more and his other physician stated he could only write for medications for his CA pain.  Discussed starting neurontin for his neuropathic symptoms, which may help temporarily with his sleep.  Although, pt may need ongoing narcotic Rx, I explained that this needs to take place through proper channels for ongoing care.  I spoke with SW about trying to get him closer follow up.    No signs of DVT, or other concerning findings on exam requiring additional workup or testing at this time.  SW assisted with arrangements for pain clinic follow up.  Pt Rx for neurontin and I explained titration.    Patient was given return precautions for leg swelling.  Pt advised on use of medications as applicable.  Advised to return for actely worsening symptoms, inability to take medications, or other acute concerns.  Advised to follow  up with oncology, neurology, and pain medicine as scheduled.  Patient was in agreement with and expressed understanding of follow plan, plan of care, and return precautions.  All questions answered prior to discharge.  Patient was discharged in stable  condition, ambulating with cane without difficulty.  Patient care was discussed with my attending, Dr. Venora Maples.   Hoyle Sauer, MD 06/08/15 [place], MD 06/08/15 [phone]

## 2015-06-10 ENCOUNTER — Other Ambulatory Visit: Payer: Self-pay | Admitting: Oncology

## 2015-06-11 ENCOUNTER — Ambulatory Visit (HOSPITAL_BASED_OUTPATIENT_CLINIC_OR_DEPARTMENT_OTHER): Payer: Medicare HMO

## 2015-06-11 ENCOUNTER — Ambulatory Visit: Payer: Medicare HMO | Admitting: Nutrition

## 2015-06-11 ENCOUNTER — Other Ambulatory Visit: Payer: Medicare HMO

## 2015-06-11 ENCOUNTER — Ambulatory Visit
Admission: RE | Admit: 2015-06-11 | Discharge: 2015-06-11 | Disposition: A | Discharge status: Home / Self Care | Payer: Medicare HMO | Source: Ambulatory Visit | Attending: Radiation Oncology | Admitting: Radiation Oncology

## 2015-06-11 ENCOUNTER — Encounter: Payer: Self-pay | Admitting: *Deleted

## 2015-06-11 ENCOUNTER — Other Ambulatory Visit (HOSPITAL_BASED_OUTPATIENT_CLINIC_OR_DEPARTMENT_OTHER): Payer: Medicare HMO

## 2015-06-11 VITALS — BP 133/88 | HR 76 | Temp 98.4°F | Resp 20

## 2015-06-11 DIAGNOSIS — Z5111 Encounter for antineoplastic chemotherapy: Secondary | ICD-10-CM

## 2015-06-11 DIAGNOSIS — C154 Malignant neoplasm of middle third of esophagus: Secondary | ICD-10-CM

## 2015-06-11 DIAGNOSIS — G5793 Unspecified mononeuropathy of bilateral lower limbs: Secondary | ICD-10-CM

## 2015-06-11 DIAGNOSIS — Z510 Encounter for antineoplastic radiation therapy: Secondary | ICD-10-CM | POA: Diagnosis not present

## 2015-06-11 LAB — COMPREHENSIVE METABOLIC PANEL (CC13)
ALBUMIN: 2.5 g/dL — AB (ref 3.5–5.0)
ALK PHOS: 186 U/L — AB (ref 40–150)
ALT: 35 U/L (ref 0–55)
AST: 109 U/L — AB (ref 5–34)
Anion Gap: 7 mEq/L (ref 3–11)
BILIRUBIN TOTAL: 0.71 mg/dL (ref 0.20–1.20)
BUN: 6.1 mg/dL — AB (ref 7.0–26.0)
CO2: 23 meq/L (ref 22–29)
Calcium: 9.2 mg/dL (ref 8.4–10.4)
Chloride: 109 mEq/L (ref 98–109)
Creatinine: 0.9 mg/dL (ref 0.7–1.3)
EGFR: >90 mL/min/{1.73_m2} (ref >90)
GLUCOSE: 123 mg/dL (ref 70–140)
POTASSIUM: 4.6 meq/L (ref 3.5–5.1)
SODIUM: 138 meq/L (ref 136–145)
TOTAL PROTEIN: 7.3 g/dL (ref 6.4–8.3)

## 2015-06-11 LAB — CBC WITH DIFFERENTIAL/PLATELET
BASO%: 0.6 % (ref 0.0–2.0)
Basophils Absolute: 0 10*3/uL (ref 0.0–0.1)
EOS%: 0.1 % (ref 0.0–7.0)
Eosinophils Absolute: 0 10*3/uL (ref 0.0–0.5)
HCT: 34.7 % — ABNORMAL LOW (ref 38.4–49.9)
HEMOGLOBIN: 11.5 g/dL — AB (ref 13.0–17.1)
LYMPH%: 26.9 % (ref 14.0–49.0)
MCH: 36.4 pg — ABNORMAL HIGH (ref 27.2–33.4)
MCHC: 33.2 g/dL (ref 32.0–36.0)
MCV: 109.5 fL — ABNORMAL HIGH (ref 79.3–98.0)
MONO#: 0.1 10*3/uL (ref 0.1–0.9)
MONO%: 5 % (ref 0.0–14.0)
NEUT%: 67.4 % (ref 39.0–75.0)
NEUTROS ABS: 1.7 10*3/uL (ref 1.5–6.5)
Platelets: 79 10*3/uL — ABNORMAL LOW (ref 140–400)
RBC: 3.16 10*6/uL — AB (ref 4.20–5.82)
RDW: 20.6 % — AB (ref 11.0–14.6)
WBC: 2.6 10*3/uL — AB (ref 4.0–10.3)
lymph#: 0.7 10*3/uL — ABNORMAL LOW (ref 0.9–3.3)

## 2015-06-11 MED ORDER — SODIUM CHLORIDE 0.9 % IV SOLN
Freq: Once | INTRAVENOUS | Status: AC
  Administered 2015-06-11: 14:00:00 via INTRAVENOUS

## 2015-06-11 MED ORDER — DIPHENHYDRAMINE HCL 50 MG/ML IJ SOLN
25.0000 mg | Freq: Once | INTRAMUSCULAR | Status: AC
  Administered 2015-06-11: 0.5 mg via INTRAVENOUS

## 2015-06-11 MED ORDER — PACLITAXEL CHEMO INJECTION 300 MG/50ML
50.0000 mg/m2 | Freq: Once | INTRAVENOUS | Status: AC
  Administered 2015-06-11: 90 mg via INTRAVENOUS
  Filled 2015-06-11: qty 15

## 2015-06-11 MED ORDER — SODIUM CHLORIDE 0.9 % IV SOLN
Freq: Once | INTRAVENOUS | Status: AC
  Administered 2015-06-11: 14:00:00 via INTRAVENOUS
  Filled 2015-06-11: qty 8

## 2015-06-11 MED ORDER — FAMOTIDINE IN NACL 20-0.9 MG/50ML-% IV SOLN
20.0000 mg | Freq: Once | INTRAVENOUS | Status: AC
  Administered 2015-06-11: 20 mg via INTRAVENOUS

## 2015-06-11 MED ORDER — DIPHENHYDRAMINE HCL 50 MG/ML IJ SOLN
INTRAMUSCULAR | Status: AC
  Filled 2015-06-11: qty 1

## 2015-06-11 MED ORDER — FAMOTIDINE IN NACL 20-0.9 MG/50ML-% IV SOLN
INTRAVENOUS | Status: AC
  Filled 2015-06-11: qty 50

## 2015-06-11 MED ORDER — CARBOPLATIN CHEMO INJECTION 450 MG/45ML
210.0000 mg | Freq: Once | INTRAVENOUS | Status: AC
  Administered 2015-06-11: 210 mg via INTRAVENOUS
  Filled 2015-06-11: qty 21

## 2015-06-11 NOTE — Patient Instructions (Addendum)
[facility] Discharge Instructions for Patients Receiving Chemotherapy  Today you received the following chemotherapy agents Taxol/Carboplatin  To help prevent nausea and vomiting after your treatment, we encourage you to take your nausea medication    If you develop nausea and vomiting that is not controlled by your nausea medication, call the clinic.   BELOW ARE SYMPTOMS THAT SHOULD BE REPORTED IMMEDIATELY:  *FEVER GREATER THAN 100.5 F  *CHILLS WITH OR WITHOUT FEVER  NAUSEA AND VOMITING THAT IS NOT CONTROLLED WITH YOUR NAUSEA MEDICATION  *UNUSUAL SHORTNESS OF BREATH  *UNUSUAL BRUISING OR BLEEDING  TENDERNESS IN MOUTH AND THROAT WITH OR WITHOUT PRESENCE OF ULCERS  *URINARY PROBLEMS  *BOWEL PROBLEMS  UNUSUAL RASH Items with * indicate a potential emergency and should be followed up as soon as possible.  Feel free to call the clinic you have any questions or concerns. The clinic phone number is [phone].  Please show the CHEMO ALERT CARD at check-in to the Emergency Department and triage nurse.  Carboplatin injection What is this medicine? CARBOPLATIN (KAR boe pla tin) is a chemotherapy drug. It targets fast dividing cells, like cancer cells, and causes these cells to die. This medicine is used to treat ovarian cancer and many other cancers. This medicine may be used for other purposes; ask your health care provider or pharmacist if you have questions. What should I tell my health care provider before I take this medicine? They need to know if you have any of these conditions: -blood disorders -hearing problems -kidney disease -recent or ongoing radiation therapy -an unusual or allergic reaction to carboplatin, cisplatin, other chemotherapy, other medicines, foods, dyes, or preservatives -pregnant or trying to get pregnant -breast-feeding How should I use this medicine? This drug is usually given as an infusion into a vein. It is administered in a  hospital or clinic by a specially trained health care professional. Talk to your pediatrician regarding the use of this medicine in children. Special care may be needed. Overdosage: If you think you have taken too much of this medicine contact a poison control center or emergency room at once. NOTE: This medicine is only for you. Do not share this medicine with others. What if I miss a dose? It is important not to miss a dose. Call your doctor or health care professional if you are unable to keep an appointment. What may interact with this medicine? -medicines for seizures -medicines to increase blood counts like filgrastim, pegfilgrastim, sargramostim -some antibiotics like amikacin, gentamicin, neomycin, streptomycin, tobramycin -vaccines Talk to your doctor or health care professional before taking any of these medicines: -acetaminophen -aspirin -ibuprofen -ketoprofen -naproxen This list may not describe all possible interactions. Give your health care provider a list of all the medicines, herbs, non-prescription drugs, or dietary supplements you use. Also tell them if you smoke, drink alcohol, or use illegal drugs. Some items may interact with your medicine. What should I watch for while using this medicine? Your condition will be monitored carefully while you are receiving this medicine. You will need important blood work done while you are taking this medicine. This drug may make you feel generally unwell. This is not uncommon, as chemotherapy can affect healthy cells as well as cancer cells. Report any side effects. Continue your course of treatment even though you feel ill unless your doctor tells you to stop. In some cases, you may be given additional medicines to help with side effects. Follow all directions for their use. Call your   doctor or health care professional for advice if you get a fever, chills or sore throat, or other symptoms of a cold or flu. Do not treat yourself. This  drug decreases your body's ability to fight infections. Try to avoid being around people who are sick. This medicine may increase your risk to bruise or bleed. Call your doctor or health care professional if you notice any unusual bleeding. Be careful brushing and flossing your teeth or using a toothpick because you may get an infection or bleed more easily. If you have any dental work done, tell your dentist you are receiving this medicine. Avoid taking products that contain aspirin, acetaminophen, ibuprofen, naproxen, or ketoprofen unless instructed by your doctor. These medicines may hide a fever. Do not become pregnant while taking this medicine. Women should inform their doctor if they wish to become pregnant or think they might be pregnant. There is a potential for serious side effects to an unborn child. Talk to your health care professional or pharmacist for more information. Do not breast-feed an infant while taking this medicine. What side effects may I notice from receiving this medicine? Side effects that you should report to your doctor or health care professional as soon as possible: -allergic reactions like skin rash, itching or hives, swelling of the face, lips, or tongue -signs of infection - fever or chills, cough, sore throat, pain or difficulty passing urine -signs of decreased platelets or bleeding - bruising, pinpoint red spots on the skin, black, tarry stools, nosebleeds -signs of decreased red blood cells - unusually weak or tired, fainting spells, lightheadedness -breathing problems -changes in hearing -changes in vision -chest pain -high blood pressure -low blood counts - This drug may decrease the number of white blood cells, red blood cells and platelets. You may be at increased risk for infections and bleeding. -nausea and vomiting -pain, swelling, redness or irritation at the injection site -pain, tingling, numbness in the hands or feet -problems with balance,  talking, walking -trouble passing urine or change in the amount of urine Side effects that usually do not require medical attention (report to your doctor or health care professional if they continue or are bothersome): -hair loss -loss of appetite -metallic taste in the mouth or changes in taste This list may not describe all possible side effects. Call your doctor for medical advice about side effects. You may report side effects to FDA at 1-800-FDA-1088. Where should I keep my medicine? This drug is given in a hospital or clinic and will not be stored at home. NOTE: This sheet is a summary. It may not cover all possible information. If you have questions about this medicine, talk to your doctor, pharmacist, or health care provider.    2016, Elsevier/Gold Standard. (2007-10-26 14:38:05)  Paclitaxel injection What is this medicine? PACLITAXEL (PAK li TAX el) is a chemotherapy drug. It targets fast dividing cells, like cancer cells, and causes these cells to die. This medicine is used to treat ovarian cancer, breast cancer, and other cancers. This medicine may be used for other purposes; ask your health care provider or pharmacist if you have questions. What should I tell my health care provider before I take this medicine? They need to know if you have any of these conditions: -blood disorders -irregular heartbeat -infection (especially a virus infection such as chickenpox, cold sores, or herpes) -liver disease -previous or ongoing radiation therapy -an unusual or allergic reaction to paclitaxel, alcohol, polyoxyethylated castor oil, other chemotherapy agents, other medicines,   foods, dyes, or preservatives -pregnant or trying to get pregnant -breast-feeding How should I use this medicine? This drug is given as an infusion into a vein. It is administered in a hospital or clinic by a specially trained health care professional. Talk to your pediatrician regarding the use of this medicine  in children. Special care may be needed. Overdosage: If you think you have taken too much of this medicine contact a poison control center or emergency room at once. NOTE: This medicine is only for you. Do not share this medicine with others. What if I miss a dose? It is important not to miss your dose. Call your doctor or health care professional if you are unable to keep an appointment. What may interact with this medicine? Do not take this medicine with any of the following medications: -disulfiram -metronidazole This medicine may also interact with the following medications: -cyclosporine -diazepam -ketoconazole -medicines to increase blood counts like filgrastim, pegfilgrastim, sargramostim -other chemotherapy drugs like cisplatin, doxorubicin, epirubicin, etoposide, teniposide, vincristine -quinidine -testosterone -vaccines -verapamil Talk to your doctor or health care professional before taking any of these medicines: -acetaminophen -aspirin -ibuprofen -ketoprofen -naproxen This list may not describe all possible interactions. Give your health care provider a list of all the medicines, herbs, non-prescription drugs, or dietary supplements you use. Also tell them if you smoke, drink alcohol, or use illegal drugs. Some items may interact with your medicine. What should I watch for while using this medicine? Your condition will be monitored carefully while you are receiving this medicine. You will need important blood work done while you are taking this medicine. This drug may make you feel generally unwell. This is not uncommon, as chemotherapy can affect healthy cells as well as cancer cells. Report any side effects. Continue your course of treatment even though you feel ill unless your doctor tells you to stop. This medicine can cause serious allergic reactions. To reduce your risk you will need to take other medicine(s) before treatment with this medicine. In some cases, you may  be given additional medicines to help with side effects. Follow all directions for their use. Call your doctor or health care professional for advice if you get a fever, chills or sore throat, or other symptoms of a cold or flu. Do not treat yourself. This drug decreases your body's ability to fight infections. Try to avoid being around people who are sick. This medicine may increase your risk to bruise or bleed. Call your doctor or health care professional if you notice any unusual bleeding. Be careful brushing and flossing your teeth or using a toothpick because you may get an infection or bleed more easily. If you have any dental work done, tell your dentist you are receiving this medicine. Avoid taking products that contain aspirin, acetaminophen, ibuprofen, naproxen, or ketoprofen unless instructed by your doctor. These medicines may hide a fever. Do not become pregnant while taking this medicine. Women should inform their doctor if they wish to become pregnant or think they might be pregnant. There is a potential for serious side effects to an unborn child. Talk to your health care professional or pharmacist for more information. Do not breast-feed an infant while taking this medicine. Men are advised not to father a child while receiving this medicine. This product may contain alcohol. Ask your pharmacist or healthcare provider if this medicine contains alcohol. Be sure to tell all healthcare providers you are taking this medicine. Certain medicines, like metronidazole and disulfiram, can cause   an unpleasant reaction when taken with alcohol. The reaction includes flushing, headache, nausea, vomiting, sweating, and increased thirst. The reaction can last from 30 minutes to several hours. What side effects may I notice from receiving this medicine? Side effects that you should report to your doctor or health care professional as soon as possible: -allergic reactions like skin rash, itching or hives,  swelling of the face, lips, or tongue -low blood counts - This drug may decrease the number of white blood cells, red blood cells and platelets. You may be at increased risk for infections and bleeding. -signs of infection - fever or chills, cough, sore throat, pain or difficulty passing urine -signs of decreased platelets or bleeding - bruising, pinpoint red spots on the skin, black, tarry stools, nosebleeds -signs of decreased red blood cells - unusually weak or tired, fainting spells, lightheadedness -breathing problems -chest pain -high or low blood pressure -mouth sores -nausea and vomiting -pain, swelling, redness or irritation at the injection site -pain, tingling, numbness in the hands or feet -slow or irregular heartbeat -swelling of the ankle, feet, hands Side effects that usually do not require medical attention (report to your doctor or health care professional if they continue or are bothersome): -bone pain -complete hair loss including hair on your head, underarms, pubic hair, eyebrows, and eyelashes -changes in the color of fingernails -diarrhea -loosening of the fingernails -loss of appetite -muscle or joint pain -red flush to skin -sweating This list may not describe all possible side effects. Call your doctor for medical advice about side effects. You may report side effects to FDA at 1-800-FDA-1088. Where should I keep my medicine? This drug is given in a hospital or clinic and will not be stored at home. NOTE: This sheet is a summary. It may not cover all possible information. If you have questions about this medicine, talk to your doctor, pharmacist, or health care provider.    2016, Elsevier/Gold Standard. (2015-03-08 13:02:56)     

## 2015-06-11 NOTE — Progress Notes (Signed)
Okay to treat despite platelet count at 79 today, per Dr. Benay Spice.

## 2015-06-11 NOTE — Progress Notes (Signed)
Oncology Nurse Navigator Documentation  Oncology Nurse Navigator Flowsheets 06/11/2015  Referral date to RadOnc/MedOnc -  Navigator Encounter Type Treatment  Patient Visit Type Medonc  Treatment Phase First Chemo Tx  Barriers/Navigation Needs Family concerns  Education Pain/ Symptom Management  Interventions Coordination of Care  Referrals -  Coordination of Care MD Appointments--called [facility] to follow up on referral  Education Method Verbal;Teach-back  Support Groups/Services -  Time Spent with Patient 30  Reviewed antiemetic regimen with patient and importance of high protein diet. He reports he has an appointment with a neurologist for January, but can't tell nurse who it is. Says he called the [place] office and the person he spoke with was not helpful, so he hung up. Reports frustration with his leg pain and no one will medicate him. Attempted to explain that MD needs to know why they are treating him and try to correct the problem instead of just medicating him. Called [facility] to follow up on the referral made on 11/2 and was told they can't see him with the cancer as the diagnosis. Need to put his leg pain & numbness as the diagnosis code. Entered the patient's complaint as CPT 10 code requested and POF to scheduler to work on referral again. Office reports he had an appointment there on 10/12 that he cancelled.

## 2015-06-11 NOTE — Progress Notes (Signed)
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  He is a patient of Dr. Benay Spice.  Past medical history includes MI, hyperlipidemia, hypertension, CAD, GERD, polysubstance abuse, Barrett's esophagus, arthritis, chronic hep C with cirrhosis.  Medications include Lipitor, Lasix, MiraLAX, Compazine, Thiamine, Prilosec, and Decadron.  Labs include albumin 2.6 on November 2.  Height: 69 inches. Weight: 151.2 pounds November 2. Usual body weight: 170 pounds February 2016. BMI: 22.32  Patient reports solid food dysphasia continues. He has a poor appetite. He denies nausea, vomiting, constipation or diarrhea. Patient endorses 20 pound weight loss. Patient eats very few dairy products but will consume Ensure Plus, chocolate flavored.  Nutrition diagnosis: Unintended weight loss related to inadequate intake as evidenced by 11% weight loss over 9 months.  Intervention:  Patient educated to consume high-calorie, high-protein foods in 5-6 small meals snacks daily. Recommended patient consume Ensure Plus twice a day to 3 times a day between meals. Provided additional samples and coupons. Educated patient on strategies for improving appetite and provided a fact sheet Provided fact sheet on increasing calories and protein. Educated patient on strategies for dysphasia in altering food textures. Questions were answered.  Teach back method was used.  Contact information was given.  Monitoring, evaluation, goals: Patient will tolerate increased calories and protein to promote weight maintenance.  Next visit: Monday, November 28, during infusion.  **Disclaimer: This note was dictated with voice recognition software. Similar sounding words can inadvertently be transcribed and this note may contain transcription errors which may not have been corrected upon publication of note.**

## 2015-06-12 ENCOUNTER — Telehealth: Payer: Self-pay | Admitting: *Deleted

## 2015-06-12 ENCOUNTER — Ambulatory Visit
Admission: RE | Admit: 2015-06-12 | Discharge: 2015-06-12 | Disposition: A | Discharge status: Home / Self Care | Payer: Medicare HMO | Source: Ambulatory Visit | Attending: Radiation Oncology | Admitting: Radiation Oncology

## 2015-06-12 DIAGNOSIS — Z510 Encounter for antineoplastic radiation therapy: Secondary | ICD-10-CM | POA: Diagnosis not present

## 2015-06-12 NOTE — Telephone Encounter (Signed)
-----   Message from Azzie Glatter, RN sent at 06/11/2015  3:35 PM EST ----- Regarding: "1st time Chemotherapy---per Dr. Benay Spice" Patient received Taxol/Carbo for the 1st time today, per Dr. Benay Spice.  Tolerated treatment well.

## 2015-06-12 NOTE — Telephone Encounter (Signed)
Left voice message for pt to return call to triage or desk RN re: update on how he is doing after 1st chemo; any problems?

## 2015-06-12 NOTE — Telephone Encounter (Signed)
TC received from patient in response to a call made to him for follow up on his first chemo yesterday. He states he feels well. No reaction or side effects noted by patient overnight or today. Advised patient to call us with any problems, fevers, chills, nausea etc. Pt voiced understanding. He goes for radiation treatments daily at this time also.

## 2015-06-13 ENCOUNTER — Ambulatory Visit: Payer: Medicare HMO

## 2015-06-13 DIAGNOSIS — Z510 Encounter for antineoplastic radiation therapy: Secondary | ICD-10-CM | POA: Diagnosis not present

## 2015-06-14 ENCOUNTER — Telehealth: Payer: Self-pay | Admitting: *Deleted

## 2015-06-14 ENCOUNTER — Ambulatory Visit
Admission: RE | Admit: 2015-06-14 | Discharge: 2015-06-14 | Disposition: A | Discharge status: Home / Self Care | Payer: Medicare HMO | Source: Ambulatory Visit | Attending: Radiation Oncology | Admitting: Radiation Oncology

## 2015-06-14 DIAGNOSIS — Z510 Encounter for antineoplastic radiation therapy: Secondary | ICD-10-CM | POA: Diagnosis not present

## 2015-06-14 NOTE — Telephone Encounter (Signed)
Oncology Nurse Navigator Documentation  Oncology Nurse Navigator Flowsheets 06/14/2015  Referral date to RadOnc/MedOnc -  Navigator Encounter Type Telephone  Patient Visit Type -  Treatment Phase -  Barriers/Navigation Needs Transportation--reports his transportation benefit with [employer] will end on 06/21/15. Asking what to next?  Education -  Interventions Will forward message to CSW--SCAT application was returned on 05/25/15.  Referrals -  Coordination of Care -  Education Method -  Support Groups/Services -  Time Spent with Patient 5

## 2015-06-15 ENCOUNTER — Encounter: Payer: Self-pay | Admitting: *Deleted

## 2015-06-15 ENCOUNTER — Ambulatory Visit
Admission: RE | Admit: 2015-06-15 | Discharge: 2015-06-15 | Disposition: A | Discharge status: Home / Self Care | Payer: Medicare HMO | Source: Ambulatory Visit | Attending: Radiation Oncology | Admitting: Radiation Oncology

## 2015-06-15 ENCOUNTER — Telehealth: Payer: Self-pay | Admitting: *Deleted

## 2015-06-15 ENCOUNTER — Encounter: Payer: Self-pay | Admitting: Radiation Oncology

## 2015-06-15 VITALS — BP 128/94 | HR 75 | Temp 97.6°F | Resp 20 | Wt 157.3 lb

## 2015-06-15 DIAGNOSIS — C154 Malignant neoplasm of middle third of esophagus: Secondary | ICD-10-CM

## 2015-06-15 DIAGNOSIS — Z510 Encounter for antineoplastic radiation therapy: Secondary | ICD-10-CM | POA: Diagnosis not present

## 2015-06-15 MED ORDER — SONAFINE EX EMUL
1.0000 | Freq: Two times a day (BID) | CUTANEOUS | Status: DC
Start: 2015-06-15 — End: 2015-06-16
  Administered 2015-06-15: 1 via TOPICAL
  Filled 2015-06-15: qty 45

## 2015-06-15 MED ORDER — SUCRALFATE 1 G PO TABS: 1.0000 g | ORAL_TABLET | Freq: Four times a day (QID) | ORAL | Status: DC

## 2015-06-15 NOTE — Progress Notes (Signed)
Department of Radiation Oncology  Phone:  [phone] Fax:        [phone]  Weekly Treatment Note    Name: Patrick [name] Date: 06/15/2015 MRN: [id number] DOB: 09/21/1950   Current dose: 9 Gy  Current fraction: 5   MEDICATIONS: Current Outpatient Prescriptions  Medication Sig Dispense Refill  . aspirin 81 MG tablet Take 81 mg by mouth daily.     Marland Kitchen atorvastatin (LIPITOR) 40 MG tablet Take 1 tablet (40 mg total) by mouth daily at 6 PM. (Patient taking differently: Take 40 mg by mouth daily. ) 30 tablet 3  . carvedilol (COREG) 12.5 MG tablet Take 12.5 mg by mouth 2 (two) times daily with a meal.    . dexamethasone (DECADRON) 4 MG tablet Take 10 mg (2.5 tabs) the night prior to first chemotherapy 3 tablet 0  . feeding supplement (BOOST / RESOURCE BREEZE) LIQD Take 1 Container by mouth 3 (three) times daily between meals. 90 Container 0  . folic acid (FOLVITE) 1 MG tablet Take 1 tablet (1 mg total) by mouth daily. 30 tablet 0  . lisinopril (PRINIVIL,ZESTRIL) 20 MG tablet Take 20 mg by mouth daily.     . polyethylene glycol (MIRALAX / GLYCOLAX) packet Take 17 g by mouth daily as needed for mild constipation.     . Wound Dressings (SONAFINE) Apply 1 application topically daily.    . furosemide (LASIX) 20 MG tablet Take 1 tablet as needed for fluid retention (Patient not taking: Reported on 06/15/2015) 30 tablet 3  . gabapentin (NEURONTIN) 300 MG capsule Take 1 capsule (300 mg total) by mouth 3 (three) times daily. Start with 1 pill at night and increase to 1 pill 3 times per day as tolerated. 90 capsule 2  . nitroGLYCERIN (NITROSTAT) 0.4 MG SL tablet Place 1 tablet (0.4 mg total) under the tongue every 5 (five) minutes as needed for chest pain. (Patient not taking: Reported on 06/15/2015) 25 tablet 2  . omeprazole (PRILOSEC) 20 MG capsule Take 1 capsule (20 mg total) by mouth 2 (two) times daily. (Patient not taking: Reported on 06/08/2015) 60 capsule 3  . oxyCODONE-acetaminophen  (PERCOCET) 7.5-325 MG tablet Take 1 tablet by mouth every 6 (six) hours as needed for severe pain. (Patient not taking: Reported on 06/15/2015) 30 tablet 0  . prochlorperazine (COMPAZINE) 10 MG tablet Take 1 tablet (10 mg total) by mouth every 6 (six) hours as needed for nausea or vomiting. (Patient not taking: Reported on 06/15/2015) 30 tablet 0  . sucralfate (CARAFATE) 1 G tablet Take 1 tablet (1 g total) by mouth 4 (four) times daily. 120 tablet 2  . thiamine 100 MG tablet Take 1 tablet (100 mg total) by mouth daily. (Patient not taking: Reported on 06/15/2015) 30 tablet 0  . traMADol (ULTRAM) 50 MG tablet Take 50 mg by mouth every 12 (twelve) hours as needed for moderate pain.     Marland Kitchen triamcinolone cream (KENALOG) 0.1 % Apply 1 application topically 2 (two) times daily as needed (apply to rash).      Current Facility-Administered Medications  Medication Dose Route Frequency Provider Last Rate Last Dose  . SONAFINE emulsion 1 application  1 application Topical BID Kyung Rudd, MD   1 application at [id number]     ALLERGIES: Review of patient's allergies indicates no known allergies.   LABORATORY DATA:  Lab Results  Component Value Date   WBC 2.6* 06/11/2015   HGB 11.5* 06/11/2015   HCT 34.7* 06/11/2015   MCV 109.5*  06/11/2015   PLT 79* 06/11/2015   Lab Results  Component Value Date   NA 138 06/11/2015   K 4.6 06/11/2015   CL 104 04/30/2015   CO2 23 06/11/2015   Lab Results  Component Value Date   ALT 35 06/11/2015   AST 109* 06/11/2015   ALKPHOS 186* 06/11/2015   BILITOT 0.71 06/11/2015     NARRATIVE: Patrick [name] was seen today for weekly treatment management. The chart was checked and the patient's films were reviewed.  wekly rad txs esophagus 5/25 completed, pt education done, sonafine cream, my business card and radiation therapy and you book given, discussed ways to manage side effects, fatigue, skin irritation, pain, throat changes, pt c/o difficulty swallowing  his pills , drinks noost breeze 2-3x day, ate grits this am, c/o heartburn, not taking prilosec stated too big a pill to swallow,  C./o left  And right leg thigh to fwet burning, over taking gabapentin for  pain , can only sleep about 2 hours at night wakes up in pain 5:49 PM BP 128/94 mmHg  Pulse 75  Temp(Src) 97.6 F (36.4 C) (Oral)  Resp 20  Wt 157 lb 4.8 oz (71.351 kg)  SpO2 99%  Wt Readings from Last 3 Encounters:  06/15/15 157 lb 4.8 oz (71.351 kg)  06/06/15 151 lb 3.2 oz (68.584 kg)  05/30/15 150 lb 9.6 oz (68.312 kg)    PHYSICAL EXAMINATION: weight is 157 lb 4.8 oz (71.351 kg). His oral temperature is 97.6 F (36.4 C). His blood pressure is 128/94 and his pulse is 75. His respiration is 20 and oxygen saturation is 99%.        ASSESSMENT: The patient is doing satisfactorily with treatment.  PLAN: We will continue with the patient's radiation treatment as planned. The patient was given a prescription for Carafate.

## 2015-06-15 NOTE — Progress Notes (Signed)
[place] Work  Clinical Social Work received confirmation that pt is certified for [employer]. CSW set up appt for [employer] to bring pt on 06/22/15. Pt aware and can set up other SCAT rides.  Pt may need asst with SCAT pass while doing radiation and CSW to assist at chemo on 11/14.   Clinical Social Work interventions: Transportation asst.   Loren Racer, [place] Social Worker [place]  [place] Phone: [phone] Fax: [phone]

## 2015-06-15 NOTE — Telephone Encounter (Signed)
  Oncology Nurse Navigator Documentation    Navigator Encounter Type: Telephone (06/15/15 1643): Left WM with [facility] requesting status of his new patient referral. He will be in our office Monday and RN can communicate the appointment to him then.

## 2015-06-15 NOTE — Progress Notes (Signed)
wekly rad txs esophagus 5/25 completed, pt education done, sonafine cream, my business card and radiation therapy and you book given, discussed ways to manage side effects, fatigue, skin irritation, pain, throat changes, pt c/o difficulty swallowing his pills , drinks noost breeze 2-3x day, ate grits this am, c/o heartburn, not taking prilosec stated too big a pill to swallow,  C./o left  And right leg thigh to fwet burning, over taking gabapentin for  pain , can only sleep about 2 hours at night wakes up in pain 11:42 AM BP 128/94 mmHg  Pulse 75  Temp(Src) 97.6 F (36.4 C) (Oral)  Resp 20  Wt 157 lb 4.8 oz (71.351 kg)  SpO2 99%  Wt Readings from Last 3 Encounters:  06/15/15 157 lb 4.8 oz (71.351 kg)  06/06/15 151 lb 3.2 oz (68.584 kg)  05/30/15 150 lb 9.6 oz (68.312 kg)

## 2015-06-15 NOTE — Progress Notes (Signed)
[place] Work  Clinical Social Work was referred by patient navigator for assessment of psychosocial needs due to transportation concerns.  Clinical Social Worker phoned SCAT office to inquire if pt was indeed certified, but he should be all set to use SCAT. CSW left vm for SCAT office to confirm certification. CSW phoned pt and left message with SCAT reservation number for pt to call and set up reservations. CSW explained he should be certified as paperwork was sent to [place]. CSW left vm for pt to phone CSW for further assistance.    Clinical Social Work interventions: Resource coordination  Loren Racer, [place] Worker [place]  [place] Phone: [phone] Fax: [phone]

## 2015-06-16 ENCOUNTER — Emergency Department (HOSPITAL_COMMUNITY)
Admission: EM | Admit: 2015-06-16 | Discharge: 2015-06-16 | Disposition: A | Discharge status: Home / Self Care | Payer: Medicare HMO | Attending: Emergency Medicine | Admitting: Emergency Medicine

## 2015-06-16 ENCOUNTER — Encounter (HOSPITAL_COMMUNITY): Payer: Self-pay | Admitting: Emergency Medicine

## 2015-06-16 DIAGNOSIS — Z9889 Other specified postprocedural states: Secondary | ICD-10-CM | POA: Insufficient documentation

## 2015-06-16 DIAGNOSIS — E785 Hyperlipidemia, unspecified: Secondary | ICD-10-CM | POA: Diagnosis not present

## 2015-06-16 DIAGNOSIS — Z79899 Other long term (current) drug therapy: Secondary | ICD-10-CM | POA: Insufficient documentation

## 2015-06-16 DIAGNOSIS — M79671 Pain in right foot: Secondary | ICD-10-CM | POA: Diagnosis present

## 2015-06-16 DIAGNOSIS — Z720 Tobacco use: Secondary | ICD-10-CM | POA: Insufficient documentation

## 2015-06-16 DIAGNOSIS — I10 Essential (primary) hypertension: Secondary | ICD-10-CM | POA: Insufficient documentation

## 2015-06-16 DIAGNOSIS — Z8619 Personal history of other infectious and parasitic diseases: Secondary | ICD-10-CM | POA: Insufficient documentation

## 2015-06-16 DIAGNOSIS — Z8659 Personal history of other mental and behavioral disorders: Secondary | ICD-10-CM | POA: Insufficient documentation

## 2015-06-16 DIAGNOSIS — G8929 Other chronic pain: Secondary | ICD-10-CM | POA: Insufficient documentation

## 2015-06-16 DIAGNOSIS — Z7982 Long term (current) use of aspirin: Secondary | ICD-10-CM | POA: Diagnosis not present

## 2015-06-16 DIAGNOSIS — M79604 Pain in right leg: Secondary | ICD-10-CM | POA: Insufficient documentation

## 2015-06-16 DIAGNOSIS — K219 Gastro-esophageal reflux disease without esophagitis: Secondary | ICD-10-CM | POA: Insufficient documentation

## 2015-06-16 DIAGNOSIS — M1990 Unspecified osteoarthritis, unspecified site: Secondary | ICD-10-CM | POA: Diagnosis not present

## 2015-06-16 DIAGNOSIS — K2270 Barrett's esophagus without dysplasia: Secondary | ICD-10-CM | POA: Insufficient documentation

## 2015-06-16 DIAGNOSIS — I252 Old myocardial infarction: Secondary | ICD-10-CM | POA: Diagnosis not present

## 2015-06-16 DIAGNOSIS — I2510 Atherosclerotic heart disease of native coronary artery without angina pectoris: Secondary | ICD-10-CM | POA: Insufficient documentation

## 2015-06-16 DIAGNOSIS — Z86018 Personal history of other benign neoplasm: Secondary | ICD-10-CM | POA: Diagnosis not present

## 2015-06-16 DIAGNOSIS — D649 Anemia, unspecified: Secondary | ICD-10-CM | POA: Insufficient documentation

## 2015-06-16 DIAGNOSIS — G629 Polyneuropathy, unspecified: Secondary | ICD-10-CM | POA: Insufficient documentation

## 2015-06-16 DIAGNOSIS — R202 Paresthesia of skin: Secondary | ICD-10-CM | POA: Diagnosis not present

## 2015-06-16 MED ORDER — IBUPROFEN 400 MG PO TABS
600.0000 mg | ORAL_TABLET | Freq: Once | ORAL | Status: AC
  Administered 2015-06-16: 600 mg via ORAL
  Filled 2015-06-16 (×2): qty 1

## 2015-06-16 MED ORDER — OXYCODONE-ACETAMINOPHEN 5-325 MG PO TABS
1.0000 | ORAL_TABLET | Freq: Once | ORAL | Status: AC
  Administered 2015-06-16: 1 via ORAL
  Filled 2015-06-16: qty 1

## 2015-06-16 NOTE — ED Notes (Signed)
Pt was ambulatory around waiting room without assistance before checking in. No difficulty in gait noted while walking back to room.

## 2015-06-16 NOTE — ED Notes (Signed)
Pt states that pain & tingling began in his lower extremities "months ago". Locates discomfort to upper, inner thighs bilaterally, and pain/tingling radiates down to feet. Denies anything that makes pain better or worse. Rates pain as 10/10, while standing and talking in a normal voice and in NAD. Pt reports being under tx at [facility] for esophageal cancer. Was given pain medications from [facility] initially, but was denied additional meds as pain/tingling "unrelated to cancer".

## 2015-06-16 NOTE — ED Provider Notes (Signed)
CSN: [id number]     Arrival date & time 06/16/15  [id number] History  By signing my name below, I, Evelene Croon, attest that this documentation has been prepared under the direction and in the presence of Jola Schmidt, MD . Electronically Signed: Evelene Croon, Scribe. 06/16/2015. 2:26 AM.     Chief Complaint  Patient presents with  . Foot Pain   The history is provided by the patient. No language interpreter was used.     HPI Comments:  Patrick [name] is a 64 y.o. male with a history of neuropathy, who presents to the Emergency Department complaining of chronic pain to his BLE. He has been experiencing the same pain for months. His pain is rated 10/10.  Pt saw his PCP yesterday (06/15/2015) for his symptom and was  given Lyrica which he has taken without relief. He has also been evaluated in the ED on 06/08/15 for the same, he was discharged with Neurontin which has also provider no relief. He states the only thing that has provided relief is oxycodone.  No alleviating factors noted. Pt has no other complaints or symptoms at this time.    Past Medical History  Diagnosis Date  . MI (myocardial infarction) ([place])     x3, jan, aug, nov 2011; treated at [facility] in [place]  . Sexual dysfunction   . HTN (hypertension)   . Hyperlipidemia   . Chronic chest pain   . CAD (coronary artery disease)   . Thrombocytopenia ([place])   . SSBE (short-segment Barrett's esophagus) 11/11/11  . Colon adenomas 11/11/11    x 5, one rectal tv adenoma  . GERD (gastroesophageal reflux disease)   . 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 Barrett's esophagus 11/17/2011    Short-segment dx 11/2011   . Arthritis     knees  . Cataract     bil removed  . Anemia   . Alcoholism ([place]) 01/02/2012    cocaine abuse, herion abuse in past   Past Surgical History  Procedure Laterality Date  . Hernia repair      very young  . Balloon angioplasty, artery  03/2010  . Colonoscopy  11/11/11    Dr. Silvano Rusk  .  Esophagogastroduodenoscopy  11/11/11    Dr. Silvano Rusk  . Cardiac catheterization  01/31/2011  . Coronary angioplasty    . Cataract extraction w/phaco  08/18/2012    Procedure: CATARACT EXTRACTION PHACO AND INTRAOCULAR LENS PLACEMENT (IOC);  Surgeon: Adonis Brook, MD;  Location: [facility];  Service: Ophthalmology;  Laterality: Right;  . Cataract extraction w/phaco Left 09/22/2012    Procedure: CATARACT EXTRACTION PHACO AND INTRAOCULAR LENS PLACEMENT (IOC);  Surgeon: Adonis Brook, MD;  Location: [facility];  Service: Ophthalmology;  Laterality: Left;  . Upper gastrointestinal endoscopy    . Esophagogastroduodenoscopy (egd) with propofol N/A 04/27/2015    Procedure: ESOPHAGOGASTRODUODENOSCOPY (EGD) WITH PROPOFOL;  Surgeon: Jerene Bears, MD;  Location: [facility] ENDOSCOPY;  Service: Endoscopy;  Laterality: N/A;   Family History  Problem Relation Age of Onset  . Cancer      mother's side/fathers side, 2 uncles  . Heart attack Maternal Grandfather   . Diabetes Sister   . Stroke Sister   . Colon cancer Neg Hx   . Esophageal cancer Neg Hx   . Rectal cancer Neg Hx   . Stomach cancer Neg Hx    Social History  Substance Use Topics  . Smoking status: Current Some Day Smoker -- 0.10 packs/day for 45 years  Types: Cigarettes  . Smokeless tobacco: Never Used     Comment: 1-2 cig/day  . Alcohol Use: Yes     Comment: Pt reports drinking 2 bottles of wine tonight. "I drink a 5th of wine daily".    Review of Systems  Musculoskeletal: Positive for myalgias (BLE).  Neurological:       + Tingling of BLE  All other systems reviewed and are negative.   Allergies  Review of patient's allergies indicates no known allergies.  Home Medications   Prior to Admission medications   Medication Sig Start Date End Date Taking? Authorizing Provider  aspirin 81 MG tablet Take 81 mg by mouth daily.     Historical Provider, MD  atorvastatin (LIPITOR) 40 MG tablet Take 1 tablet (40 mg total) by mouth daily at 6 PM. Patient  taking differently: Take 40 mg by mouth daily.  11/30/12   Timmothy Euler, MD  carvedilol (COREG) 12.5 MG tablet Take 12.5 mg by mouth 2 (two) times daily with a meal. 09/05/12   Brooke O Edmisten, PA-C  dexamethasone (DECADRON) 4 MG tablet Take 10 mg (2.5 tabs) the night prior to first chemotherapy 06/06/15   Owens Shark, NP  feeding supplement (BOOST / RESOURCE BREEZE) LIQD Take 1 Container by mouth 3 (three) times daily between meals. 04/30/15   Thurnell Lose, MD  folic acid (FOLVITE) 1 MG tablet Take 1 tablet (1 mg total) by mouth daily. 04/30/15   Thurnell Lose, MD  furosemide (LASIX) 20 MG tablet Take 1 tablet as needed for fluid retention Patient not taking: Reported on 06/15/2015 04/02/15   Brittainy Erie Noe, PA-C  gabapentin (NEURONTIN) 300 MG capsule Take 1 capsule (300 mg total) by mouth 3 (three) times daily. Start with 1 pill at night and increase to 1 pill 3 times per day as tolerated. 06/08/15   Hoyle Sauer, MD  lisinopril (PRINIVIL,ZESTRIL) 20 MG tablet Take 20 mg by mouth daily.     Historical Provider, MD  nitroGLYCERIN (NITROSTAT) 0.4 MG SL tablet Place 1 tablet (0.4 mg total) under the tongue every 5 (five) minutes as needed for chest pain. Patient not taking: Reported on 06/15/2015 03/03/15   Brittainy M Rosita Fire, PA-C  omeprazole (PRILOSEC) 20 MG capsule Take 1 capsule (20 mg total) by mouth 2 (two) times daily. Patient not taking: Reported on 06/08/2015 03/08/15   Ladene Artist, MD  oxyCODONE-acetaminophen (PERCOCET) 7.5-325 MG tablet Take 1 tablet by mouth every 6 (six) hours as needed for severe pain. Patient not taking: Reported on 06/15/2015 05/22/15   Ladell Pier, MD  polyethylene glycol [facility] / Floria Raveling) packet Take 17 g by mouth daily as needed for mild constipation.  03/23/15   Historical Provider, MD  prochlorperazine (COMPAZINE) 10 MG tablet Take 1 tablet (10 mg total) by mouth every 6 (six) hours as needed for nausea or vomiting. Patient not taking:  Reported on 06/15/2015 06/06/15   Owens Shark, NP  sucralfate (CARAFATE) 1 G tablet Take 1 tablet (1 g total) by mouth 4 (four) times daily. 06/15/15   Kyung Rudd, MD  thiamine 100 MG tablet Take 1 tablet (100 mg total) by mouth daily. Patient not taking: Reported on 06/15/2015 04/30/15   Thurnell Lose, MD  traMADol (ULTRAM) 50 MG tablet Take 50 mg by mouth every 12 (twelve) hours as needed for moderate pain.  03/22/15   Historical Provider, MD  triamcinolone cream (KENALOG) 0.1 % Apply 1 application topically 2 (two) times daily  as needed (apply to rash).  04/14/15   Historical Provider, MD  Wound Dressings (SONAFINE) Apply 1 application topically daily.    Historical Provider, MD   BP 116/83 mmHg  Pulse 91  Temp(Src) 99.6 F (37.6 C) (Oral)  Resp 16  Ht 5\' 9"  (1.753 m)  Wt 155 lb (70.308 kg)  BMI 22.88 kg/m2  SpO2 99% Physical Exam  Constitutional: He is oriented to person, place, and time. He appears well-developed and well-nourished. No distress.  HENT:  Head: Normocephalic.  Eyes: EOM are normal.  Neck: Normal range of motion.  Cardiovascular:  PT and DP pulses 2+ in  BLE    Pulmonary/Chest: Effort normal.  Abdominal: He exhibits no distension.  Musculoskeletal: Normal range of motion. He exhibits no edema.  Neurological: He is alert and oriented to person, place, and time.  Skin: Skin is warm and dry. No erythema.  Psychiatric: He has a normal mood and affect.  Nursing note and vitals reviewed.   ED Course  Procedures   DIAGNOSTIC STUDIES:  Oxygen Saturation is 99% on RA, normal by my interpretation.    COORDINATION OF CARE:  2:20 AM Will administer pain meds in the ED.  Discussed treatment plan with pt at bedside and pt agreed to plan.   MDM   Final diagnoses:  Chronic pain    Chronic pain. Normal pulses. Dc home with pcp follow up  I personally performed the services described in this documentation, which was scribed in my presence. The recorded  information has been reviewed and is accurate.       Jola Schmidt, MD 06/16/15 [phone]

## 2015-06-16 NOTE — ED Notes (Signed)
Reviewed d/c instructions with pt. Pt voiced understanding and agreed to stay in waiting room for 4hrs to be certain he was okay to drive himself home. Pt departed under his own power and in NAD.

## 2015-06-16 NOTE — Discharge Instructions (Signed)
Chronic Pain Discharge Instructions  °Emergency care providers appreciate that many patients coming to us are in severe pain and we wish to address their pain in the safest, most responsible manner.  It is important to recognize however, that the proper treatment of chronic pain differs from that of the pain of injuries and acute illnesses.  Our goal is to provide quality, safe, personalized care and we thank you for giving us the opportunity to serve you. °The use of narcotics and related agents for chronic pain syndromes may lead to additional physical and psychological problems.  Nearly as many people die from prescription narcotics each year as die from car crashes.  Additionally, this risk is increased if such prescriptions are obtained from a variety of sources.  Therefore, only your primary care physician or a pain management specialist is able to safely treat such syndromes with narcotic medications long-term.   ° °Documentation revealing such prescriptions have been sought from multiple sources may prohibit us from providing a refill or different narcotic medication.  Your name may be checked first through the [place] Controlled Substances Reporting System.  This database is a record of controlled substance medication prescriptions that the patient has received.  This has been established by [facility] in an effort to eliminate the dangerous, and often life threatening, practice of obtaining multiple prescriptions from different medical providers.  ° °If you have a chronic pain syndrome (i.e. chronic headaches, recurrent back or neck pain, dental pain, abdominal or pelvis pain without a specific diagnosis, or neuropathic pain such as fibromyalgia) or recurrent visits for the same condition without an acute diagnosis, you may be treated with non-narcotics and other non-addictive medicines.  Allergic reactions or negative side effects that may be reported by a patient to such medications will not  typically lead to the use of a narcotic analgesic or other controlled substance as an alternative. °  °Patients managing chronic pain with a personal physician should have provisions in place for breakthrough pain.  If you are in crisis, you should call your physician.  If your physician directs you to the emergency department, please have the doctor call and speak to our attending physician concerning your care. °  °When patients come to the Emergency Department (ED) with acute medical conditions in which the Emergency Department physician feels appropriate to prescribe narcotic or sedating pain medication, the physician will prescribe these in very limited quantities.  The amount of these medications will last only until you can see your primary care physician in his/her office.  Any patient who returns to the ED seeking refills should expect only non-narcotic pain medications.  ° °In the event of an acute medical condition exists and the emergency physician feels it is necessary that the patient be given a narcotic or sedating medication -  a responsible adult driver should be present in the room prior to the medication being given by the nurse. °  °Prescriptions for narcotic or sedating medications that have been lost, stolen or expired will not be refilled in the Emergency Department.   ° °Patients who have chronic pain may receive non-narcotic prescriptions until seen by their primary care physician.  It is every patient’s personal responsibility to maintain active prescriptions with his or her primary care physician or specialist. °

## 2015-06-17 ENCOUNTER — Other Ambulatory Visit: Payer: Self-pay | Admitting: Oncology

## 2015-06-18 ENCOUNTER — Telehealth: Payer: Self-pay | Admitting: Oncology

## 2015-06-18 ENCOUNTER — Ambulatory Visit: Payer: Medicare HMO

## 2015-06-18 ENCOUNTER — Other Ambulatory Visit (HOSPITAL_BASED_OUTPATIENT_CLINIC_OR_DEPARTMENT_OTHER): Payer: Medicare HMO

## 2015-06-18 ENCOUNTER — Ambulatory Visit (HOSPITAL_BASED_OUTPATIENT_CLINIC_OR_DEPARTMENT_OTHER): Payer: Medicare HMO | Admitting: Nurse Practitioner

## 2015-06-18 ENCOUNTER — Telehealth: Payer: Self-pay | Admitting: *Deleted

## 2015-06-18 VITALS — BP 110/74 | HR 80 | Temp 98.1°F | Resp 18 | Ht 69.0 in | Wt 153.4 lb

## 2015-06-18 DIAGNOSIS — D709 Neutropenia, unspecified: Secondary | ICD-10-CM

## 2015-06-18 DIAGNOSIS — K2270 Barrett's esophagus without dysplasia: Secondary | ICD-10-CM

## 2015-06-18 DIAGNOSIS — K7460 Unspecified cirrhosis of liver: Secondary | ICD-10-CM

## 2015-06-18 DIAGNOSIS — D696 Thrombocytopenia, unspecified: Secondary | ICD-10-CM | POA: Diagnosis not present

## 2015-06-18 DIAGNOSIS — C154 Malignant neoplasm of middle third of esophagus: Secondary | ICD-10-CM

## 2015-06-18 DIAGNOSIS — C159 Malignant neoplasm of esophagus, unspecified: Secondary | ICD-10-CM | POA: Diagnosis not present

## 2015-06-18 DIAGNOSIS — Z510 Encounter for antineoplastic radiation therapy: Secondary | ICD-10-CM | POA: Diagnosis not present

## 2015-06-18 LAB — COMPREHENSIVE METABOLIC PANEL (CC13)
ALBUMIN: 2.7 g/dL — AB (ref 3.5–5.0)
ALK PHOS: 124 U/L (ref 40–150)
ALT: 126 U/L — ABNORMAL HIGH (ref 0–55)
AST: 172 U/L — AB (ref 5–34)
Anion Gap: 10 mEq/L (ref 3–11)
BILIRUBIN TOTAL: 0.9 mg/dL (ref 0.20–1.20)
BUN: 8.6 mg/dL (ref 7.0–26.0)
CALCIUM: 9.4 mg/dL (ref 8.4–10.4)
CO2: 24 mEq/L (ref 22–29)
Chloride: 103 mEq/L (ref 98–109)
Creatinine: 0.8 mg/dL (ref 0.7–1.3)
GLUCOSE: 102 mg/dL (ref 70–140)
Potassium: 3.8 mEq/L (ref 3.5–5.1)
SODIUM: 136 meq/L (ref 136–145)
TOTAL PROTEIN: 7.4 g/dL (ref 6.4–8.3)

## 2015-06-18 LAB — CBC WITH DIFFERENTIAL/PLATELET
BASO%: 1.1 % (ref 0.0–2.0)
Basophils Absolute: 0 10*3/uL (ref 0.0–0.1)
EOS ABS: 0 10*3/uL (ref 0.0–0.5)
EOS%: 1 % (ref 0.0–7.0)
HEMATOCRIT: 30.6 % — AB (ref 38.4–49.9)
HEMOGLOBIN: 10.2 g/dL — AB (ref 13.0–17.1)
LYMPH#: 0.7 10*3/uL — AB (ref 0.9–3.3)
LYMPH%: 38.7 % (ref 14.0–49.0)
MCH: 36.5 pg — ABNORMAL HIGH (ref 27.2–33.4)
MCHC: 33.3 g/dL (ref 32.0–36.0)
MCV: 109.6 fL — AB (ref 79.3–98.0)
MONO#: 0.1 10*3/uL (ref 0.1–0.9)
MONO%: 4.4 % (ref 0.0–14.0)
NEUT%: 54.8 % (ref 39.0–75.0)
NEUTROS ABS: 0.9 10*3/uL — AB (ref 1.5–6.5)
Platelets: 51 10*3/uL — ABNORMAL LOW (ref 140–400)
RBC: 2.79 10*6/uL — ABNORMAL LOW (ref 4.20–5.82)
RDW: 19.4 % — AB (ref 11.0–14.6)
WBC: 1.7 10*3/uL — AB (ref 4.0–10.3)

## 2015-06-18 NOTE — Progress Notes (Signed)
  [facility] OFFICE PROGRESS NOTE   Diagnosis:  Esophagus cancer  INTERVAL HISTORY:   Patrick [name] returns as scheduled. He began radiation and weekly Taxol/carboplatin on 06/11/2015. He denies nausea/vomiting. No mouth sores. No diarrhea. No rash. No signs or symptoms of an allergic reaction. He is able to tolerate liquids if he drinks slowly. He is not able to take in solids. He complains of a sore throat. He continues to have bilateral leg pain. He has noted no improvement with Lyrica or Neurontin. Oxycodone has been effective in the past  Objective:  Vital signs in last 24 hours:  Blood pressure 110/74, pulse 80, temperature 98.1 F (36.7 C), temperature source Oral, resp. rate 18, height 5\' 9"  (1.753 m), weight 153 lb 6.4 oz (69.582 kg), SpO2 100 %.    HEENT: No thrush or ulcers. Resp: Lungs clear bilaterally. Cardio: Regular rate and rhythm. GI: Abdomen soft. Mild diffuse abdominal tenderness. No hepatomegaly. Vascular: No leg edema.    Lab Results:  Lab Results  Component Value Date   WBC 1.7* 06/18/2015   HGB 10.2* 06/18/2015   HCT 30.6* 06/18/2015   MCV 109.6* 06/18/2015   PLT 51* 06/18/2015   NEUTROABS 0.9* 06/18/2015    Imaging:  No results found.  Medications: I have reviewed the patient's current medications.  Assessment/Plan: 1. 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 No evidence of hypermetabolic nodal or distant metastasis.  Initiation of radiation 06/11/2015; initiation of weekly Taxol/carboplatin 06/11/2015 2. Cirrhosis 3. Solid dysphagia secondary to #1 4. Hepatitis  C 5. Polysubstance abuse 6. History of coronary artery disease 7. Thrombocytopenia-likely secondary to cirrhosis, hepatitis C, and alcohol use 8. Esophageal varices 9. Barrett's esophagus 10. Report of "Numbness" and leg pain   Disposition: Patrick [name] began radiation and weekly Taxol/carboplatin on 06/11/2015. He will continue radiation. We are holding Taxol/carboplatin today due to neutropenia and progressive thrombocytopenia. He will return in one week for labs, follow-up visit and chemotherapy. He will contact the office in the interim with any problems. We specifically discussed fever, chills, other signs of infection, bleeding, inability to tolerate liquids.  He continues follow up with his PCP regarding the bilateral leg pain.  Plan reviewed with Dr. Benay Spice.  Ned Card ANP/GNP-BC   06/18/2015  12:23 PM

## 2015-06-18 NOTE — Telephone Encounter (Signed)
Gave and printed appt sched and avs for pt for NOV and DEC °

## 2015-06-18 NOTE — Telephone Encounter (Signed)
Per staff message and POF I have scheduled appts. Advised scheduler of appts. JMW  

## 2015-06-19 ENCOUNTER — Encounter: Payer: Self-pay | Admitting: *Deleted

## 2015-06-19 ENCOUNTER — Ambulatory Visit: Payer: Medicare HMO

## 2015-06-19 DIAGNOSIS — Z510 Encounter for antineoplastic radiation therapy: Secondary | ICD-10-CM | POA: Diagnosis not present

## 2015-06-19 NOTE — Progress Notes (Signed)
[place] Work  Holiday representative met with patient at [facility] to discuss transportation needs.  Patient transportation benefit through [facility] will be completed on 11/17.  CSW confirmed that patient has been approved for SCAT and rides scheduled for the 11/18 and the following 7 days.  CSW provided patient with a 10 ride SCAT pass.  Patient confirmed he had the SCAT contact number.  CSW encouraged patient to call with any questions or concerns.     Johnnye Lana, MSW, LCSW, OSW-C Clinical Social Worker [facility] [phone]

## 2015-06-20 ENCOUNTER — Ambulatory Visit: Payer: Medicare HMO

## 2015-06-20 DIAGNOSIS — Z510 Encounter for antineoplastic radiation therapy: Secondary | ICD-10-CM | POA: Diagnosis not present

## 2015-06-21 ENCOUNTER — Ambulatory Visit: Payer: Medicare HMO

## 2015-06-21 DIAGNOSIS — Z510 Encounter for antineoplastic radiation therapy: Secondary | ICD-10-CM | POA: Diagnosis not present

## 2015-06-22 ENCOUNTER — Ambulatory Visit: Payer: Medicare HMO

## 2015-06-22 ENCOUNTER — Ambulatory Visit
Admission: RE | Admit: 2015-06-22 | Discharge: 2015-06-22 | Disposition: A | Discharge status: Home / Self Care | Payer: Medicare HMO | Source: Ambulatory Visit | Attending: Radiation Oncology | Admitting: Radiation Oncology

## 2015-06-22 ENCOUNTER — Encounter: Payer: Self-pay | Admitting: Radiation Oncology

## 2015-06-22 VITALS — BP 119/79 | HR 20 | Temp 98.5°F | Resp 20 | Wt 152.1 lb

## 2015-06-22 DIAGNOSIS — C154 Malignant neoplasm of middle third of esophagus: Secondary | ICD-10-CM

## 2015-06-22 DIAGNOSIS — Z510 Encounter for antineoplastic radiation therapy: Secondary | ICD-10-CM | POA: Diagnosis not present

## 2015-06-22 MED ORDER — SUCRALFATE 1 G PO TABS: 1.0000 g | ORAL_TABLET | Freq: Four times a day (QID) | ORAL | Status: DC

## 2015-06-22 NOTE — Progress Notes (Signed)
Department of Radiation Oncology  Phone:  [phone] Fax:        [phone]  Weekly Treatment Note    Name: Patrick [name] Date: 06/22/2015 MRN: [id number] DOB: Jan 24, 1951   Current dose: 18 Gy  Current fraction: 10   MEDICATIONS: Current Outpatient Prescriptions  Medication Sig Dispense Refill  . aspirin 81 MG tablet Take 81 mg by mouth daily.     Marland Kitchen atorvastatin (LIPITOR) 40 MG tablet Take 1 tablet (40 mg total) by mouth daily at 6 PM. (Patient taking differently: Take 40 mg by mouth daily. ) 30 tablet 3  . feeding supplement (BOOST / RESOURCE BREEZE) LIQD Take 1 Container by mouth 3 (three) times daily between meals. 90 Container 0  . folic acid (FOLVITE) 1 MG tablet Take 1 tablet (1 mg total) by mouth daily. 30 tablet 0  . lisinopril (PRINIVIL,ZESTRIL) 20 MG tablet Take 20 mg by mouth daily.     Marland Kitchen oxyCODONE-acetaminophen (PERCOCET) 7.5-325 MG tablet Take 1 tablet by mouth every 6 (six) hours as needed for severe pain. 30 tablet 0  . polyethylene glycol (MIRALAX / GLYCOLAX) packet Take 17 g by mouth daily as needed for mild constipation.     . triamcinolone cream (KENALOG) 0.1 % Apply 1 application topically 2 (two) times daily as needed (apply to rash).     . carvedilol (COREG) 12.5 MG tablet Take 12.5 mg by mouth 2 (two) times daily with a meal.    . dexamethasone (DECADRON) 4 MG tablet Take 10 mg (2.5 tabs) the night prior to first chemotherapy (Patient not taking: Reported on 06/22/2015) 3 tablet 0  . furosemide (LASIX) 20 MG tablet Take 1 tablet as needed for fluid retention (Patient not taking: Reported on 06/15/2015) 30 tablet 3  . gabapentin (NEURONTIN) 300 MG capsule Take 1 capsule (300 mg total) by mouth 3 (three) times daily. Start with 1 pill at night and increase to 1 pill 3 times per day as tolerated. (Patient not taking: Reported on 06/22/2015) 90 capsule 2  . nitroGLYCERIN (NITROSTAT) 0.4 MG SL tablet Place 1 tablet (0.4 mg total) under the tongue every 5  (five) minutes as needed for chest pain. (Patient not taking: Reported on 06/15/2015) 25 tablet 2  . omeprazole (PRILOSEC) 20 MG capsule Take 1 capsule (20 mg total) by mouth 2 (two) times daily. (Patient not taking: Reported on 06/08/2015) 60 capsule 3  . prochlorperazine (COMPAZINE) 10 MG tablet Take 1 tablet (10 mg total) by mouth every 6 (six) hours as needed for nausea or vomiting. (Patient not taking: Reported on 06/15/2015) 30 tablet 0  . sucralfate (CARAFATE) 1 G tablet Take 1 tablet (1 g total) by mouth 4 (four) times daily. (Patient not taking: Reported on 06/22/2015) 120 tablet 2  . sucralfate (CARAFATE) 1 G tablet Take 1 tablet (1 g total) by mouth 4 (four) times daily. 120 tablet 2  . thiamine 100 MG tablet Take 1 tablet (100 mg total) by mouth daily. (Patient not taking: Reported on 06/15/2015) 30 tablet 0  . traMADol (ULTRAM) 50 MG tablet Take 50 mg by mouth every 12 (twelve) hours as needed for moderate pain.     . Wound Dressings (SONAFINE) Apply 1 application topically daily.     No current facility-administered medications for this encounter.     ALLERGIES: Review of patient's allergies indicates no known allergies.   LABORATORY DATA:  Lab Results  Component Value Date   WBC 1.7* 06/18/2015   HGB 10.2* 06/18/2015  HCT 30.6* 06/18/2015   MCV 109.6* 06/18/2015   PLT 51* 06/18/2015   Lab Results  Component Value Date   NA 136 06/18/2015   K 3.8 06/18/2015   CL 104 04/30/2015   CO2 24 06/18/2015   Lab Results  Component Value Date   ALT 126* 06/18/2015   AST 172* 06/18/2015   ALKPHOS 124 06/18/2015   BILITOT 0.90 06/18/2015     NARRATIVE: Patrick [name] was seen today for weekly treatment management. The chart was checked and the patient's films were reviewed.  Weekly radiation txs esophagus 10/25 completed. He is unable to swallow solids. He is able to take some pills but gave the Carafate back to the pharmacy because they pills were too big. I mentioned that  the Carafate should be dissolved in water and he said he would go back to the pharmacy to get it and start taking it. He complains about pain mid sternum. He is not taking his Prilosec and is unable to swallow. He denies nausea. He describes his pain on a scale as 6/10.   11:55 AM BP 119/79 mmHg  Pulse 20  Temp(Src) 98.5 F (36.9 C) (Oral)  Resp 20  Wt 152 lb 1.6 oz (68.992 kg)  Wt Readings from Last 3 Encounters:   06/22/15  152 lb 1.6 oz (68.992 kg)   06/18/15  153 lb 6.4 oz (69.582 kg)   06/16/15  155 lb (70.308 kg)     PHYSICAL EXAMINATION: weight is 152 lb 1.6 oz (68.992 kg). His oral temperature is 98.5 F (36.9 C). His blood pressure is 119/79 and his pulse is 20. His respiration is 20.        ASSESSMENT: The patient is doing satisfactorily with treatment.  PLAN: We will continue with the patient's radiation treatment as planned. I will write him another prescription for Carafate.       This document serves as a record of services personally performed by Kyung Rudd, MD. It was created on his behalf by  Lendon Collar, a trained medical scribe. The creation of this record is based on the scribe's personal observations and the provider's statements to them. This document has been checked and approved by the attending provider.

## 2015-06-22 NOTE — Progress Notes (Signed)
Weekly  Rad txs , 10/25 esophagus completed,  Unable to swallow  Solids , takes some pills, turned Carafte back in to pharmacy because they were big pills,  Checked patient mouth, tongue is swollen and looks like the start of thrush towards back of tongue, c/o pain mid sternum "Feels like being swollen  At times feels like something is in there" not tking his prilosec unable to swallow, no nausea pain 6/10 scale,  11:31 AM BP 119/79 mmHg  Pulse 20  Temp(Src) 98.5 F (36.9 C) (Oral)  Resp 20  Wt 152 lb 1.6 oz (68.992 kg)  Wt Readings from Last 3 Encounters:  06/22/15 152 lb 1.6 oz (68.992 kg)  06/18/15 153 lb 6.4 oz (69.582 kg)  06/16/15 155 lb (70.308 kg)

## 2015-06-22 NOTE — Progress Notes (Signed)
  Radiation Oncology         [phone] ________________________________  Name: Patrick [name] MRN: [id number]  Date: 05/30/2015  DOB: [birth year]  SIMULATION AND TREATMENT PLANNING NOTE  DIAGNOSIS:  Esophageal cancer  Site:  Proximal esophagus/supraclavicular region  NARRATIVE:  The patient was brought to the [facility].  Identity was confirmed.  All relevant records and images related to the planned course of therapy were reviewed.   Written consent to proceed with treatment was confirmed which was freely given after reviewing the details related to the planned course of therapy had been reviewed with the patient.  Then, the patient was set-up in a stable reproducible  supine position for radiation therapy.  CT images were obtained.  Surface markings were placed.    Medically necessary complex treatment device(s) for immobilization:  Customized vac lock bag.   The CT images were loaded into the planning software.  Then the target and avoidance structures were contoured.  Treatment planning then occurred.  The radiation prescription was entered and confirmed.   I have requested : Intensity Modulated Radiotherapy (IMRT) is medically necessary for this case for the following reason:  Adequate radiation dose to the primary target while sparing the critical normal structures including the spinal cord.   The patient will undergo daily image guidance to ensure accurate localization of the target, and adequate minimize dose to the normal surrounding structures in close proximity to the target.   PLAN:  The patient will receive 45 Gy in 25 fractions.  The patient will then receive a 5.4 gray boost to yield a final total dose of 50.4 gray   Special treatment procedure The patient will also receive concurrent chemotherapy during the treatment. The patient may therefore experience increased toxicity or side effects and the patient will be monitored for such problems. This may require  extra lab work as necessary. This therefore constitutes a special treatment procedure.   ________________________________   Jodelle Gross, MD, PhD

## 2015-06-24 ENCOUNTER — Other Ambulatory Visit: Payer: Self-pay | Admitting: Oncology

## 2015-06-25 ENCOUNTER — Ambulatory Visit (HOSPITAL_BASED_OUTPATIENT_CLINIC_OR_DEPARTMENT_OTHER): Payer: Medicare HMO | Admitting: Oncology

## 2015-06-25 ENCOUNTER — Telehealth: Payer: Self-pay | Admitting: Internal Medicine

## 2015-06-25 ENCOUNTER — Ambulatory Visit
Admission: RE | Admit: 2015-06-25 | Discharge: 2015-06-25 | Disposition: A | Discharge status: Home / Self Care | Payer: Medicare HMO | Source: Ambulatory Visit | Attending: Radiation Oncology | Admitting: Radiation Oncology

## 2015-06-25 ENCOUNTER — Ambulatory Visit (HOSPITAL_BASED_OUTPATIENT_CLINIC_OR_DEPARTMENT_OTHER): Payer: Medicare HMO

## 2015-06-25 ENCOUNTER — Other Ambulatory Visit: Payer: Self-pay

## 2015-06-25 ENCOUNTER — Other Ambulatory Visit (HOSPITAL_BASED_OUTPATIENT_CLINIC_OR_DEPARTMENT_OTHER): Payer: Medicare HMO

## 2015-06-25 ENCOUNTER — Other Ambulatory Visit: Payer: Self-pay | Admitting: *Deleted

## 2015-06-25 VITALS — BP 116/91 | HR 115 | Temp 98.4°F | Resp 17 | Ht 69.0 in | Wt 147.1 lb

## 2015-06-25 DIAGNOSIS — D696 Thrombocytopenia, unspecified: Secondary | ICD-10-CM

## 2015-06-25 DIAGNOSIS — C154 Malignant neoplasm of middle third of esophagus: Secondary | ICD-10-CM

## 2015-06-25 DIAGNOSIS — R109 Unspecified abdominal pain: Secondary | ICD-10-CM

## 2015-06-25 DIAGNOSIS — R188 Other ascites: Secondary | ICD-10-CM

## 2015-06-25 DIAGNOSIS — R1310 Dysphagia, unspecified: Secondary | ICD-10-CM

## 2015-06-25 DIAGNOSIS — Z5111 Encounter for antineoplastic chemotherapy: Secondary | ICD-10-CM

## 2015-06-25 LAB — CBC WITH DIFFERENTIAL/PLATELET
BASO%: 0.7 % (ref 0.0–2.0)
Basophils Absolute: 0 10*3/uL (ref 0.0–0.1)
EOS%: 0.7 % (ref 0.0–7.0)
Eosinophils Absolute: 0 10*3/uL (ref 0.0–0.5)
HEMATOCRIT: 31.2 % — AB (ref 38.4–49.9)
HEMOGLOBIN: 10.3 g/dL — AB (ref 13.0–17.1)
LYMPH#: 0.6 10*3/uL — AB (ref 0.9–3.3)
LYMPH%: 23 % (ref 14.0–49.0)
MCH: 36 pg — ABNORMAL HIGH (ref 27.2–33.4)
MCHC: 33.1 g/dL (ref 32.0–36.0)
MCV: 108.9 fL — ABNORMAL HIGH (ref 79.3–98.0)
MONO#: 0.7 10*3/uL (ref 0.1–0.9)
MONO%: 30.3 % — ABNORMAL HIGH (ref 0.0–14.0)
NEUT%: 45.3 % (ref 39.0–75.0)
NEUTROS ABS: 1.1 10*3/uL — AB (ref 1.5–6.5)
PLATELETS: 103 10*3/uL — AB (ref 140–400)
RBC: 2.87 10*6/uL — ABNORMAL LOW (ref 4.20–5.82)
RDW: 19.7 % — AB (ref 11.0–14.6)
WBC: 2.4 10*3/uL — AB (ref 4.0–10.3)

## 2015-06-25 LAB — COMPREHENSIVE METABOLIC PANEL (CC13)
ALT: 72 U/L — AB (ref 0–55)
ANION GAP: 10 meq/L (ref 3–11)
AST: 75 U/L — AB (ref 5–34)
Albumin: 3 g/dL — ABNORMAL LOW (ref 3.5–5.0)
Alkaline Phosphatase: 103 U/L (ref 40–150)
BILIRUBIN TOTAL: 0.65 mg/dL (ref 0.20–1.20)
BUN: 12.8 mg/dL (ref 7.0–26.0)
CALCIUM: 9.4 mg/dL (ref 8.4–10.4)
CO2: 20 mEq/L — ABNORMAL LOW (ref 22–29)
CREATININE: 1.2 mg/dL (ref 0.7–1.3)
Chloride: 108 mEq/L (ref 98–109)
EGFR: 73 mL/min/{1.73_m2} — ABNORMAL LOW (ref >90)
Glucose: 148 mg/dl — ABNORMAL HIGH (ref 70–140)
Potassium: 4.1 mEq/L (ref 3.5–5.1)
Sodium: 139 mEq/L (ref 136–145)
TOTAL PROTEIN: 7.8 g/dL (ref 6.4–8.3)

## 2015-06-25 LAB — PROTIME-INR
INR: 1.1 — AB (ref 2.00–3.50)
PROTIME: 13.2 s (ref 10.6–13.4)

## 2015-06-25 MED ORDER — DIPHENHYDRAMINE HCL 50 MG/ML IJ SOLN
INTRAMUSCULAR | Status: AC
  Filled 2015-06-25: qty 1

## 2015-06-25 MED ORDER — SODIUM CHLORIDE 0.9 % IV SOLN
Freq: Once | INTRAVENOUS | Status: AC
  Administered 2015-06-25: 13:00:00 via INTRAVENOUS

## 2015-06-25 MED ORDER — HYDROCODONE-ACETAMINOPHEN 7.5-325 MG/15ML PO SOLN: 10.0000 mL | Freq: Two times a day (BID) | ORAL | Status: DC | PRN

## 2015-06-25 MED ORDER — DIPHENHYDRAMINE HCL 50 MG/ML IJ SOLN
25.0000 mg | Freq: Once | INTRAMUSCULAR | Status: AC
  Administered 2015-06-25: 25 mg via INTRAVENOUS

## 2015-06-25 MED ORDER — PACLITAXEL CHEMO INJECTION 300 MG/50ML
35.0000 mg/m2 | Freq: Once | INTRAVENOUS | Status: AC
  Administered 2015-06-25: 66 mg via INTRAVENOUS
  Filled 2015-06-25: qty 11

## 2015-06-25 MED ORDER — FAMOTIDINE IN NACL 20-0.9 MG/50ML-% IV SOLN
20.0000 mg | Freq: Once | INTRAVENOUS | Status: AC
Start: 2015-06-25 — End: 2015-06-25
  Administered 2015-06-25: 20 mg via INTRAVENOUS

## 2015-06-25 MED ORDER — SODIUM CHLORIDE 0.9 % IV SOLN
Freq: Once | INTRAVENOUS | Status: AC
  Administered 2015-06-25: 13:00:00 via INTRAVENOUS
  Filled 2015-06-25: qty 8

## 2015-06-25 MED ORDER — SODIUM CHLORIDE 0.9 % IV SOLN
INTRAVENOUS | Status: AC
  Administered 2015-06-25: 13:00:00 via INTRAVENOUS

## 2015-06-25 MED ORDER — FAMOTIDINE IN NACL 20-0.9 MG/50ML-% IV SOLN
INTRAVENOUS | Status: AC
  Filled 2015-06-25: qty 50

## 2015-06-25 MED ORDER — CARBOPLATIN CHEMO INJECTION 450 MG/45ML
180.0000 mg | Freq: Once | INTRAVENOUS | Status: AC
  Administered 2015-06-25: 180 mg via INTRAVENOUS
  Filled 2015-06-25: qty 18

## 2015-06-25 NOTE — Addendum Note (Signed)
Addended by: Marlon Pel on: 06/25/2015 01:33 PM   Modules accepted: Orders

## 2015-06-25 NOTE — Telephone Encounter (Signed)
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 Benay Spice asking for PEG which is reasonable   Patient can be done 1215 tomorrow at [facility] - I have spoken to [facility] charge RN and they should be holding spot and I believe MAC though not essential   Patient has cirrhosis so he needs  1) Abd [place] in AM or perhaps this PM - R/O ascites 2) Set up for the EGD/PEG at [facility] tomorrow 3) call to Dr. Gearldine Shown RN - patient at [facility] til about 3 today - Kenney Houseman is RN and she is at [phone] - to arrange etc 4) Ancef 1 g IV on call when you place orders 5) I anticipate obs admit after most likely 6) also have them get an INR on him at cancer center today

## 2015-06-25 NOTE — Patient Instructions (Signed)
Cancer Center Discharge Instructions for Patients Receiving Chemotherapy  Today you received the following chemotherapy agents Taxol/Carboplatin  To help prevent nausea and vomiting after your treatment, we encourage you to take your nausea medication    If you develop nausea and vomiting that is not controlled by your nausea medication, call the clinic.   BELOW ARE SYMPTOMS THAT SHOULD BE REPORTED IMMEDIATELY:  *FEVER GREATER THAN 100.5 F  *CHILLS WITH OR WITHOUT FEVER  NAUSEA AND VOMITING THAT IS NOT CONTROLLED WITH YOUR NAUSEA MEDICATION  *UNUSUAL SHORTNESS OF BREATH  *UNUSUAL BRUISING OR BLEEDING  TENDERNESS IN MOUTH AND THROAT WITH OR WITHOUT PRESENCE OF ULCERS  *URINARY PROBLEMS  *BOWEL PROBLEMS  UNUSUAL RASH Items with * indicate a potential emergency and should be followed up as soon as possible.  Feel free to call the clinic you have any questions or concerns. The clinic phone number is [phone].  Please show the CHEMO ALERT CARD at check-in to the Emergency Department and triage nurse.   

## 2015-06-25 NOTE — Progress Notes (Signed)
OK to treat despite low WBC/ANC per Dr Darnell Level. Kavin Leech.

## 2015-06-25 NOTE — Progress Notes (Signed)
  Patrick [name]   Diagnosis: Esophagus cancer  INTERVAL HISTORY:   Patrick [name] returns as scheduled. He complains of worsened dysphagia. He now has difficulty swallowing saliva. He continues to have pain in the anterior chest. He complains of constant pain in both legs. He has a neurology appointment in January. Chemotherapy was held last week secondary to cytopenias.  Objective:  Vital signs in last 24 hours:  Blood pressure 116/91, pulse 115, temperature 98.4 F (36.9 C), temperature source Oral, resp. rate 17, height 5\' 9"  (1.753 m), weight 147 lb 1.6 oz (66.724 kg), SpO2 99 %.    HEENT: No thrush, the mucous membranes are moist Resp: Lungs clear bilaterally Cardio: Regular rate and rhythm GI: No hepatomegaly, tender in the subxiphoid region, no mass Vascular: The left lower leg is slightly larger than the right side. No erythema   Lab Results:  Lab Results  Component Value Date   WBC 2.4* 06/25/2015   HGB 10.3* 06/25/2015   HCT 31.2* 06/25/2015   MCV 108.9* 06/25/2015   PLT 103* 06/25/2015   NEUTROABS 1.1* 06/25/2015     Medications: I have reviewed the patient's current medications.  Assessment/Plan: 1. 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 No evidence of hypermetabolic nodal or distant metastasis.  Initiation of radiation 06/11/2015; initiation of weekly Taxol/carboplatin 06/11/2015 2. Cirrhosis 3. Solid dysphagia secondary to #1 4. Hepatitis C 5. Polysubstance abuse 6. History of coronary artery disease 7. Thrombocytopenia-likely secondary to cirrhosis, hepatitis C,  and alcohol use 8. Esophageal varices 9. Barrett's esophagus 10. Report of "Numbness" and leg pain-etiology unclear, neurology evaluation pending 11. Neutropenia secondary to chemotherapy, Taxol and carboplatin dose reduced with week #2 chemotherapy 06/25/2015   Disposition:  Patrick [name] has completed 1 treatment with Taxol/carboplatin. He tolerated chemotherapy well, but developed neutropenia and progressive thrombocytopenia following chemotherapy. The plan is to resume chemotherapy this week with a dose reduction. He will contact us for a fever or bleeding.  He has severe solid/liquid dysphasia. I will contact Dr. Carlean Purl to discuss placement of a gastrostomy feeding tube.  I gave him a prescription for hydrocodone to use as needed for pain. We will try to get the neurology appointment moved to a sooner date.  Betsy Coder, MD  06/25/2015  11:42 AM

## 2015-06-25 NOTE — Progress Notes (Signed)
OK to treat with WBC 2.4 and ANC 1.1, per Dr. Benay Spice. Myrtle, Infusion RN made aware.

## 2015-06-25 NOTE — Telephone Encounter (Signed)
Patient is scheduled for EGD with PEG placement tomorrow at 10:45 arrival for Procedure at 12:15 He will come for [place] tomorrow at [facility] for 7:45 arrival.  He is aware to be NPO for both procedures.   He verbalizes understanding to be NPO after midnight and to remain that way until his procedures Per Dr. Carlean Purl cancel XRT treatment.  Tonya with Dr. Hillery Aldo notified.

## 2015-06-26 ENCOUNTER — Ambulatory Visit (HOSPITAL_COMMUNITY)
Admission: RE | Admit: 2015-06-26 | Discharge: 2015-06-26 | Disposition: A | Discharge status: Home / Self Care | Payer: Medicare HMO | Source: Ambulatory Visit | Attending: Internal Medicine | Admitting: Internal Medicine

## 2015-06-26 ENCOUNTER — Observation Stay (HOSPITAL_COMMUNITY): Payer: Medicare HMO

## 2015-06-26 ENCOUNTER — Encounter (HOSPITAL_COMMUNITY): Payer: Self-pay

## 2015-06-26 ENCOUNTER — Encounter (HOSPITAL_COMMUNITY)
Admission: RE | Disposition: A | Discharge status: Home Health | Payer: Self-pay | Source: Ambulatory Visit | Attending: Internal Medicine

## 2015-06-26 ENCOUNTER — Inpatient Hospital Stay (HOSPITAL_COMMUNITY)
Admission: RE | Admit: 2015-06-26 | Discharge: 2015-06-29 | DRG: 374 | Disposition: A | Discharge status: Home Health | Payer: Medicare HMO | Source: Ambulatory Visit | Attending: Internal Medicine | Admitting: Internal Medicine

## 2015-06-26 ENCOUNTER — Ambulatory Visit
Admission: RE | Admit: 2015-06-26 | Discharge: 2015-06-26 | Disposition: A | Discharge status: Home / Self Care | Payer: Medicare HMO | Source: Ambulatory Visit | Attending: Radiation Oncology | Admitting: Radiation Oncology

## 2015-06-26 ENCOUNTER — Observation Stay (HOSPITAL_COMMUNITY): Payer: Medicare HMO | Admitting: Anesthesiology

## 2015-06-26 ENCOUNTER — Telehealth: Payer: Self-pay | Admitting: Oncology

## 2015-06-26 VITALS — BP 130/81 | HR 72 | Temp 97.8°F | Resp 20 | Wt 151.7 lb

## 2015-06-26 DIAGNOSIS — B182 Chronic viral hepatitis C: Secondary | ICD-10-CM | POA: Diagnosis not present

## 2015-06-26 DIAGNOSIS — G9340 Encephalopathy, unspecified: Secondary | ICD-10-CM | POA: Diagnosis not present

## 2015-06-26 DIAGNOSIS — E785 Hyperlipidemia, unspecified: Secondary | ICD-10-CM | POA: Diagnosis present

## 2015-06-26 DIAGNOSIS — I10 Essential (primary) hypertension: Secondary | ICD-10-CM | POA: Diagnosis not present

## 2015-06-26 DIAGNOSIS — B1920 Unspecified viral hepatitis C without hepatic coma: Secondary | ICD-10-CM | POA: Diagnosis present

## 2015-06-26 DIAGNOSIS — K2270 Barrett's esophagus without dysplasia: Secondary | ICD-10-CM

## 2015-06-26 DIAGNOSIS — R188 Other ascites: Secondary | ICD-10-CM

## 2015-06-26 DIAGNOSIS — C153 Malignant neoplasm of upper third of esophagus: Principal | ICD-10-CM | POA: Diagnosis present

## 2015-06-26 DIAGNOSIS — I2510 Atherosclerotic heart disease of native coronary artery without angina pectoris: Secondary | ICD-10-CM | POA: Diagnosis present

## 2015-06-26 DIAGNOSIS — F1020 Alcohol dependence, uncomplicated: Secondary | ICD-10-CM | POA: Diagnosis not present

## 2015-06-26 DIAGNOSIS — D649 Anemia, unspecified: Secondary | ICD-10-CM | POA: Diagnosis present

## 2015-06-26 DIAGNOSIS — D6959 Other secondary thrombocytopenia: Secondary | ICD-10-CM | POA: Diagnosis present

## 2015-06-26 DIAGNOSIS — I252 Old myocardial infarction: Secondary | ICD-10-CM

## 2015-06-26 DIAGNOSIS — R1310 Dysphagia, unspecified: Secondary | ICD-10-CM | POA: Diagnosis not present

## 2015-06-26 DIAGNOSIS — N539 Unspecified male sexual dysfunction: Secondary | ICD-10-CM | POA: Diagnosis present

## 2015-06-26 DIAGNOSIS — K7460 Unspecified cirrhosis of liver: Secondary | ICD-10-CM | POA: Diagnosis present

## 2015-06-26 DIAGNOSIS — F1910 Other psychoactive substance abuse, uncomplicated: Secondary | ICD-10-CM | POA: Diagnosis present

## 2015-06-26 DIAGNOSIS — M13861 Other specified arthritis, right knee: Secondary | ICD-10-CM | POA: Diagnosis present

## 2015-06-26 DIAGNOSIS — Z7982 Long term (current) use of aspirin: Secondary | ICD-10-CM

## 2015-06-26 DIAGNOSIS — R410 Disorientation, unspecified: Secondary | ICD-10-CM | POA: Insufficient documentation

## 2015-06-26 DIAGNOSIS — J9811 Atelectasis: Secondary | ICD-10-CM | POA: Diagnosis present

## 2015-06-26 DIAGNOSIS — C154 Malignant neoplasm of middle third of esophagus: Secondary | ICD-10-CM

## 2015-06-26 DIAGNOSIS — Z9861 Coronary angioplasty status: Secondary | ICD-10-CM

## 2015-06-26 DIAGNOSIS — Z86010 Personal history of colonic polyps: Secondary | ICD-10-CM

## 2015-06-26 DIAGNOSIS — K219 Gastro-esophageal reflux disease without esophagitis: Secondary | ICD-10-CM | POA: Diagnosis present

## 2015-06-26 DIAGNOSIS — R4182 Altered mental status, unspecified: Secondary | ICD-10-CM

## 2015-06-26 DIAGNOSIS — D696 Thrombocytopenia, unspecified: Secondary | ICD-10-CM | POA: Diagnosis present

## 2015-06-26 DIAGNOSIS — F17210 Nicotine dependence, cigarettes, uncomplicated: Secondary | ICD-10-CM | POA: Diagnosis present

## 2015-06-26 DIAGNOSIS — Z8249 Family history of ischemic heart disease and other diseases of the circulatory system: Secondary | ICD-10-CM

## 2015-06-26 DIAGNOSIS — Z720 Tobacco use: Secondary | ICD-10-CM | POA: Diagnosis present

## 2015-06-26 DIAGNOSIS — M13862 Other specified arthritis, left knee: Secondary | ICD-10-CM | POA: Diagnosis present

## 2015-06-26 DIAGNOSIS — R1319 Other dysphagia: Secondary | ICD-10-CM | POA: Diagnosis present

## 2015-06-26 DIAGNOSIS — R0602 Shortness of breath: Secondary | ICD-10-CM

## 2015-06-26 DIAGNOSIS — R109 Unspecified abdominal pain: Secondary | ICD-10-CM

## 2015-06-26 DIAGNOSIS — F329 Major depressive disorder, single episode, unspecified: Secondary | ICD-10-CM | POA: Diagnosis present

## 2015-06-26 DIAGNOSIS — Z79899 Other long term (current) drug therapy: Secondary | ICD-10-CM

## 2015-06-26 HISTORY — PX: PEG PLACEMENT: SHX5437

## 2015-06-26 LAB — CBC
HCT: 27.2 % — ABNORMAL LOW (ref 39.0–52.0)
HEMOGLOBIN: 9 g/dL — AB (ref 13.0–17.0)
MCH: 35 pg — ABNORMAL HIGH (ref 26.0–34.0)
MCHC: 33.1 g/dL (ref 30.0–36.0)
MCV: 105.8 fL — ABNORMAL HIGH (ref 78.0–100.0)
PLATELETS: 81 10*3/uL — AB (ref 150–400)
RBC: 2.57 MIL/uL — ABNORMAL LOW (ref 4.22–5.81)
RDW: 17.4 % — ABNORMAL HIGH (ref 11.5–15.5)
WBC: 2.1 10*3/uL — ABNORMAL LOW (ref 4.0–10.5)

## 2015-06-26 LAB — AMMONIA

## 2015-06-26 LAB — CREATININE, SERUM
CREATININE: 0.88 mg/dL (ref 0.61–1.24)
GFR calc Af Amer: >60 mL/min (ref >60)

## 2015-06-26 SURGERY — INSERTION, PEG TUBE
Anesthesia: Monitor Anesthesia Care

## 2015-06-26 MED ORDER — CEFAZOLIN SODIUM-DEXTROSE 2-3 GM-% IV SOLR
2.0000 g | INTRAVENOUS | Status: AC
  Administered 2015-06-26: 2 g via INTRAVENOUS
  Filled 2015-06-26: qty 50

## 2015-06-26 MED ORDER — PROPOFOL 10 MG/ML IV BOLUS
INTRAVENOUS | Status: AC
  Filled 2015-06-26: qty 60

## 2015-06-26 MED ORDER — LORAZEPAM 2 MG/ML IJ SOLN: 1.0000 mg | Freq: Four times a day (QID) | INTRAMUSCULAR | Status: DC | PRN

## 2015-06-26 MED ORDER — PANTOPRAZOLE SODIUM 40 MG PO TBEC
40.0000 mg | DELAYED_RELEASE_TABLET | Freq: Every day | ORAL | Status: DC
  Administered 2015-06-27 – 2015-06-29 (×3): 40 mg via ORAL
  Filled 2015-06-26 (×3): qty 1

## 2015-06-26 MED ORDER — ADULT MULTIVITAMIN W/MINERALS CH
1.0000 | ORAL_TABLET | Freq: Every day | ORAL | Status: DC
  Administered 2015-06-27 – 2015-06-29 (×3): 1 via ORAL
  Filled 2015-06-26 (×3): qty 1

## 2015-06-26 MED ORDER — LACTULOSE ENEMA
300.0000 mL | Freq: Two times a day (BID) | ORAL | Status: DC
  Administered 2015-06-26: 300 mL via RECTAL
  Filled 2015-06-26 (×4): qty 300

## 2015-06-26 MED ORDER — LORAZEPAM 1 MG PO TABS: 1.0000 mg | ORAL_TABLET | Freq: Four times a day (QID) | ORAL | Status: DC | PRN

## 2015-06-26 MED ORDER — HYDROCODONE-ACETAMINOPHEN 7.5-325 MG/15ML PO SOLN
10.0000 mL | Freq: Two times a day (BID) | ORAL | Status: DC | PRN
  Administered 2015-06-29: 10 mL via ORAL
  Filled 2015-06-26: qty 15

## 2015-06-26 MED ORDER — CEFAZOLIN (ANCEF) 1 G IV SOLR: 1.0000 g | Freq: Once | INTRAVENOUS | Status: DC

## 2015-06-26 MED ORDER — LORAZEPAM 2 MG/ML IJ SOLN
1.0000 mg | Freq: Once | INTRAMUSCULAR | Status: AC
  Administered 2015-06-26: 1 mg via INTRAVENOUS
  Filled 2015-06-26: qty 1

## 2015-06-26 MED ORDER — POLYETHYLENE GLYCOL 3350 17 G PO PACK: 17.0000 g | PACK | Freq: Every day | ORAL | Status: DC | PRN

## 2015-06-26 MED ORDER — HYDRALAZINE HCL 20 MG/ML IJ SOLN: 5.0000 mg | INTRAMUSCULAR | Status: DC | PRN

## 2015-06-26 MED ORDER — CARVEDILOL 12.5 MG PO TABS
12.5000 mg | ORAL_TABLET | Freq: Two times a day (BID) | ORAL | Status: DC
  Administered 2015-06-27 – 2015-06-29 (×5): 12.5 mg via ORAL
  Filled 2015-06-26 (×6): qty 1

## 2015-06-26 MED ORDER — FOLIC ACID 1 MG PO TABS: 1.0000 mg | ORAL_TABLET | Freq: Every day | ORAL | Status: DC

## 2015-06-26 MED ORDER — SONAFINE EX EMUL
1.0000 "application " | Freq: Every day | CUTANEOUS | Status: DC
  Administered 2015-06-27 – 2015-06-29 (×3): 1 via TOPICAL
  Filled 2015-06-26: qty 45

## 2015-06-26 MED ORDER — HYDROMORPHONE HCL 1 MG/ML IJ SOLN
1.0000 mg | INTRAMUSCULAR | Status: DC | PRN
  Administered 2015-06-26 – 2015-06-29 (×10): 1 mg via INTRAVENOUS
  Filled 2015-06-26 (×11): qty 1

## 2015-06-26 MED ORDER — IPRATROPIUM-ALBUTEROL 0.5-2.5 (3) MG/3ML IN SOLN: 3.0000 mL | RESPIRATORY_TRACT | Status: DC | PRN

## 2015-06-26 MED ORDER — THIAMINE HCL 100 MG/ML IJ SOLN
100.0000 mg | Freq: Every day | INTRAMUSCULAR | Status: DC
  Administered 2015-06-28: 100 mg via INTRAVENOUS
  Filled 2015-06-26 (×2): qty 2

## 2015-06-26 MED ORDER — THIAMINE HCL 100 MG/ML IJ SOLN: 100.0000 mg | Freq: Every day | INTRAMUSCULAR | Status: DC

## 2015-06-26 MED ORDER — HYDROMORPHONE HCL 1 MG/ML IJ SOLN
1.0000 mg | Freq: Once | INTRAMUSCULAR | Status: AC
  Administered 2015-06-26: 1 mg via INTRAVENOUS

## 2015-06-26 MED ORDER — ONDANSETRON HCL 4 MG PO TABS: 4.0000 mg | ORAL_TABLET | Freq: Four times a day (QID) | ORAL | Status: DC | PRN

## 2015-06-26 MED ORDER — HYDROMORPHONE HCL 1 MG/ML IJ SOLN
INTRAMUSCULAR | Status: AC
  Filled 2015-06-26: qty 1

## 2015-06-26 MED ORDER — ASPIRIN 81 MG PO CHEW
81.0000 mg | CHEWABLE_TABLET | Freq: Every day | ORAL | Status: DC
  Administered 2015-06-27: 81 mg via ORAL
  Filled 2015-06-26: qty 1

## 2015-06-26 MED ORDER — ADULT MULTIVITAMIN W/MINERALS CH: 1.0000 | ORAL_TABLET | Freq: Every day | ORAL | Status: DC

## 2015-06-26 MED ORDER — VITAMIN B-1 100 MG PO TABS: 100.0000 mg | ORAL_TABLET | Freq: Every day | ORAL | Status: DC

## 2015-06-26 MED ORDER — LISINOPRIL 20 MG PO TABS
20.0000 mg | ORAL_TABLET | Freq: Every day | ORAL | Status: DC
  Administered 2015-06-27 – 2015-06-29 (×3): 20 mg via ORAL
  Filled 2015-06-26 (×3): qty 1

## 2015-06-26 MED ORDER — SODIUM CHLORIDE 0.9 % IV SOLN: INTRAVENOUS | Status: DC

## 2015-06-26 MED ORDER — KCL IN DEXTROSE-NACL 20-5-0.45 MEQ/L-%-% IV SOLN
INTRAVENOUS | Status: DC
  Administered 2015-06-26 – 2015-06-29 (×4): via INTRAVENOUS
  Filled 2015-06-26 (×8): qty 1000

## 2015-06-26 MED ORDER — ENOXAPARIN SODIUM 40 MG/0.4ML ~~LOC~~ SOLN
40.0000 mg | SUBCUTANEOUS | Status: DC
  Administered 2015-06-27: 40 mg via SUBCUTANEOUS
  Filled 2015-06-26: qty 0.4

## 2015-06-26 MED ORDER — FOLIC ACID 1 MG PO TABS
1.0000 mg | ORAL_TABLET | Freq: Every day | ORAL | Status: DC
  Administered 2015-06-27 – 2015-06-29 (×3): 1 mg via ORAL
  Filled 2015-06-26 (×3): qty 1

## 2015-06-26 MED ORDER — LORAZEPAM 2 MG/ML IJ SOLN
0.0000 mg | Freq: Four times a day (QID) | INTRAMUSCULAR | Status: AC
  Administered 2015-06-26 – 2015-06-27 (×2): 2 mg via INTRAVENOUS
  Filled 2015-06-26 (×2): qty 1

## 2015-06-26 MED ORDER — NITROGLYCERIN 0.4 MG SL SUBL: 0.4000 mg | SUBLINGUAL_TABLET | SUBLINGUAL | Status: DC | PRN

## 2015-06-26 MED ORDER — NICOTINE 21 MG/24HR TD PT24
21.0000 mg | MEDICATED_PATCH | Freq: Every day | TRANSDERMAL | Status: DC
  Administered 2015-06-27 – 2015-06-29 (×3): 21 mg via TRANSDERMAL
  Filled 2015-06-26 (×3): qty 1

## 2015-06-26 MED ORDER — ATORVASTATIN CALCIUM 40 MG PO TABS
40.0000 mg | ORAL_TABLET | Freq: Every day | ORAL | Status: DC
  Administered 2015-06-27 – 2015-06-28 (×2): 40 mg via ORAL
  Filled 2015-06-26 (×3): qty 1

## 2015-06-26 MED ORDER — PROPOFOL 10 MG/ML IV BOLUS
INTRAVENOUS | Status: DC | PRN
  Administered 2015-06-26 (×3): 20 mg via INTRAVENOUS

## 2015-06-26 MED ORDER — PROPOFOL 500 MG/50ML IV EMUL
INTRAVENOUS | Status: DC | PRN
  Administered 2015-06-26: 200 ug/kg/min via INTRAVENOUS

## 2015-06-26 MED ORDER — ONDANSETRON HCL 4 MG/2ML IJ SOLN: 4.0000 mg | Freq: Four times a day (QID) | INTRAMUSCULAR | Status: DC | PRN

## 2015-06-26 MED ORDER — LACTATED RINGERS IV SOLN
INTRAVENOUS | Status: DC
  Administered 2015-06-26: 1000 mL via INTRAVENOUS

## 2015-06-26 MED ORDER — LORAZEPAM 2 MG/ML IJ SOLN: 0.0000 mg | Freq: Two times a day (BID) | INTRAMUSCULAR | Status: DC

## 2015-06-26 MED ORDER — VITAMIN B-1 100 MG PO TABS
100.0000 mg | ORAL_TABLET | Freq: Every day | ORAL | Status: DC
  Administered 2015-06-27 – 2015-06-29 (×2): 100 mg via ORAL
  Filled 2015-06-26 (×2): qty 1

## 2015-06-26 NOTE — Progress Notes (Signed)
Department of Radiation Oncology  Phone:  [phone] Fax:        [phone]  Weekly Treatment Note    Name: Patrick [name] Date: 06/26/2015 MRN: [id number] DOB: Dec 29, 1950   Current dose: 21.6 Gy  Current fraction: 12   MEDICATIONS: No current facility-administered medications for this encounter.   No current outpatient prescriptions on file.   Facility-Administered Medications Ordered in Other Encounters  Medication Dose Route Frequency Provider Last Rate Last Dose  . [START ON 06/27/2015] aspirin chewable tablet 81 mg  81 mg Oral Daily Gatha Mayer, MD      . atorvastatin (LIPITOR) tablet 40 mg  40 mg Oral q1800 Gatha Mayer, MD      . carvedilol (COREG) tablet 12.5 mg  12.5 mg Oral BID WC Gatha Mayer, MD      . dextrose 5 % and 0.45 % NaCl with KCl 20 mEq/L infusion   Intravenous Continuous Gatha Mayer, MD 75 mL/hr at 06/26/15 1718    . [START ON 06/27/2015] enoxaparin (LOVENOX) injection 40 mg  40 mg Subcutaneous Q24H Gatha Mayer, MD      . Derrill Memo ON 123456 folic acid (FOLVITE) tablet 1 mg  1 mg Oral Daily Gatha Mayer, MD      . HYDROcodone-acetaminophen (HYCET) 7.5-325 mg/15 ml solution 10 mL  10 mL Oral BID PRN Gatha Mayer, MD      . HYDROmorphone (DILAUDID) injection 1 mg  1 mg Intravenous Q3H PRN Gatha Mayer, MD   1 mg at 06/26/15 1504  . [START ON 06/27/2015] lisinopril (PRINIVIL,ZESTRIL) tablet 20 mg  20 mg Oral Daily Gatha Mayer, MD      . nitroGLYCERIN (NITROSTAT) SL tablet 0.4 mg  0.4 mg Sublingual Q5 min PRN Gatha Mayer, MD      . ondansetron [facility]) tablet 4 mg  4 mg Oral Q6H PRN Gatha Mayer, MD       Or  . ondansetron [facility]) injection 4 mg  4 mg Intravenous Q6H PRN Gatha Mayer, MD      . Derrill Memo ON 06/27/2015] pantoprazole (PROTONIX) EC tablet 40 mg  40 mg Oral Q0600 Gatha Mayer, MD      . polyethylene glycol (MIRALAX / GLYCOLAX) packet 17 g  17 g Oral Daily PRN Gatha Mayer, MD      . Rennis Chris emulsion 1  application  1 application Topical Daily Gatha Mayer, MD   1 application at [id number]     ALLERGIES: Review of patient's allergies indicates no known allergies.   LABORATORY DATA:  Lab Results  Component Value Date   WBC 2.1* 06/26/2015   HGB 9.0* 06/26/2015   HCT 27.2* 06/26/2015   MCV 105.8* 06/26/2015   PLT 81* 06/26/2015   Lab Results  Component Value Date   NA 139 06/25/2015   K 4.1 06/25/2015   CL 104 04/30/2015   CO2 20* 06/25/2015   Lab Results  Component Value Date   ALT 72* 06/25/2015   AST 75* 06/25/2015   ALKPHOS 103 06/25/2015   BILITOT 0.65 06/25/2015     NARRATIVE: Patrick [name] was seen today for weekly treatment management. The chart was checked and the patient's films were reviewed.  Weekly rad txs esophagus 12/25 completd, unable to eat solids, took his asa, lisinopril folic acid today with difficulty takes sips, took his hycet po prn, helped pain some, has an appt for peg tube placement today, pain on throat  is 7/10 now,  5:59 PM BP 130/81 mmHg  Pulse 72  Temp(Src) 97.8 F (36.6 C) (Oral)  Resp 20  Wt 151 lb 11.2 oz (68.811 kg)  Wt Readings from Last 3 Encounters:  06/26/15 145 lb 15.1 oz (66.2 kg)  06/26/15 151 lb 11.2 oz (68.811 kg)  06/25/15 147 lb 1.6 oz (66.724 kg)    PHYSICAL EXAMINATION: weight is 151 lb 11.2 oz (68.811 kg). His oral temperature is 97.8 F (36.6 C). His blood pressure is 130/81 and his pulse is 72. His respiration is 20.        ASSESSMENT: The patient is doing satisfactorily with treatment.  PLAN: We will continue with the patient's radiation treatment as planned. Patient is having a feeding tube placed today.

## 2015-06-26 NOTE — Telephone Encounter (Signed)
Added f/u to 11/28 lab/tx. Spoke with patient he is aware.

## 2015-06-26 NOTE — Progress Notes (Signed)
Weekly rad txs esophagus 12/25 completd, unable to eat solids, took his asa, lisinopril folic acid today with difficulty takes sips, took his hycet po prn, helped pain some, has an appt for peg tube placement today, pain on throat is 7/10 now,  11:18 AM BP 130/81 mmHg  Pulse 72  Temp(Src) 97.8 F (36.6 C) (Oral)  Resp 20  Wt 151 lb 11.2 oz (68.811 kg)  Wt Readings from Last 3 Encounters:  06/26/15 151 lb 11.2 oz (68.811 kg)  06/25/15 147 lb 1.6 oz (66.724 kg)  06/22/15 152 lb 1.6 oz (68.992 kg)

## 2015-06-26 NOTE — Progress Notes (Signed)
Pt became agitated when this RN attempted to educated patient on PEG tube and demonstrate how to flush and put meds in tube. Pt became very confused and paranoid. Faith Rogue, RN, and [place], NT came in to help and security was called. He stated that he was leaving and got out of bed. Pt was very unsteady. Dr. Carlean Purl was notified. Pt spoke with MD over the telephone. After conversation, MD ordered posey belt restraint and 1mg  Ativan. Pt was assisted back to bed. Medication was administered and restraints placed. Pt rights maintained. Will continue to monitor patient closely.

## 2015-06-26 NOTE — Op Note (Signed)
[facility] [place], [address]   EGD WITH PEG PROCEDURE REPORT        EXAM DATE: 06/26/2015  PATIENT NAME:          Patrick [name], Patrick [name]          MR #: [id number] BIRTHDATE:       06-12-1951     VISIT #:     [phone] ATTENDING:     Gatha Mayer, MD, [facility]al     STATUS:     outpatient  ASSISTANT:      Cherylynn Ridges and Sheppard Plumber, [place]   INDICATIONS:  The patient is a 64 yr old male here for an EGD with PEG due to dysphagia. PROCEDURE PERFORMED:     EGD with PEG placement  MEDICATIONS:     Monitored anesthesia care and Per Anesthesia  IV Ancef prophylaxis TOPICAL ANESTHETIC:     none  CONSENT: The patient understands the risks and benefits of the procedure and understands that these risks include, but are not limited to: sedation, allergic reaction, infection, perforation and/or bleeding. Alternative means of evaluation and treatment include, among others: physical exam, x-rays, and/or surgical intervention. The patient elects to proceed with this endoscopic procedure.  DESCRIPTION OF PROCEDURE: During intra-op preparation period all mechanical & medical equipment was checked for proper function. Hand hygiene and appropriate measures for infection prevention was taken. After the risks, benefits and alternatives of the procedure were thoroughly explained, Informed consent was verified, confirmed and timeout was successfully executed by the treatment team. The patient was anesthetized with topical anesthesia and the Pentax EG-2470K endoscope was introduced through the mouth and advanced to the second portion of the duodenum.  The instrument was slowly withdrawn as the mucosa was fully examined.  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 The stomach was then inflated with air, and by a combination of transillumination and manual palpation, the site for  the gastrostomy tube placement was selected and marked on the anterior abdominal wall.  The skin of the anterior abdomen was surgically prepped and draped with sterile towels.  Utilizing strict sterile technique, the selected site was then anesthetized with 1% xylocaine by injection into the skin and subcutaneous tissue.  A 1 cm incision was made through the skin and subcutaneous tissue, and the needle/cannula assembly was then passed through the abdominal wall and through the anterior wall of the stomach, maintaining visualization with the endoscope.  A snare device previously placed through the instrument channel was then opened and placed around the cannula, the needle was removed, and the insertion wire was passed through the cannula and into the stomach lumen.  The snare was then loosened from the cannula, and repositioned to snare the insertion wire.  The snare was then pulled up to the endoscope distal tip, and the scope was then withdrawn bringing with it the snare and insertion wire.  The insertion wire was then released from the snare, and then  the 41F BS Push PEG gastrostomy tube was placed using the "push technique", the G-tube was then pulled and pushed into place by traction on the insertion wire at the abdominal wall end. The G-tube insertion site was then cleansed once again, and the external bolster was placed over the tube to secure it to the abdominal wall.  A sterile dressing was then applied, and the procedure terminated. Estimated blood loss is zero unless otherwise noted in this procedure  report. no abnormalities.  The gastroscope was then slowly withdrawn and removed.    ADVERSE EVENT:     There were no immediate complications. 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  The verification of the translation of this report to the ICD and CPT codes and modifiers is the sole responsibility of the health care institution and practicing physician where this report was generated.  [employer] will not be held responsible for the validity of the ICD and CPT codes included on this report.  AMA assumes no liability for data contained or not contained herein. CPT is a Designer, television/film set of the [employer].  PATIENT NAME:  Patrick [name], Patrick [name] MR#: [id number]

## 2015-06-26 NOTE — Transfer of Care (Signed)
Immediate Anesthesia Transfer of Care Note  Patient: Patrick [name]  Procedure(s) Performed: Procedure(s): PERCUTANEOUS ENDOSCOPIC GASTROSTOMY (PEG) PLACEMENT (N/A)  Patient Location: PACU and Endoscopy Unit  Anesthesia Type:MAC  Level of Consciousness: awake, alert  and oriented  Airway & Oxygen Therapy: Patient Spontanous Breathing and Patient connected to nasal cannula oxygen  Post-op Assessment: Report given to RN and Post -op Vital signs reviewed and stable  Post vital signs: Reviewed and stable  Last Vitals:  Filed Vitals:   06/26/15 1250  BP: 130/78  Pulse: 74  Resp: 20    Complications: No apparent anesthesia complications

## 2015-06-26 NOTE — Anesthesia Preprocedure Evaluation (Addendum)
Anesthesia Evaluation  Patient identified by MRN, date of birth, ID band Patient awake    Reviewed: Allergy & Precautions, H&P , NPO status , Patient's Chart, lab work & pertinent test results  History of Anesthesia Complications Negative for: history of anesthetic complications  Airway Mallampati: I  TM Distance: >3 FB Neck ROM: Full    Dental  (+) Edentulous Upper, Dental Advisory Given   Pulmonary Current Smoker,    + rhonchi        Cardiovascular hypertension, Pt. on medications and Pt. on home beta blockers + CAD and + Past MI   Rhythm:Regular Rate:Normal  Echo 04/27/2015 - Left ventricle: There is hypokinesis of the basal inferior andinferolateral walls. The cavity size was normal. There was mildconcentric hypertrophy. Systolic function was normal. Theestimated ejection fraction was in the range of 55% to 60%. Wallmotion was normal; there were no regional wall motionabnormalities. Doppler parameters are consistent with abnormalleft ventricular relaxation (grade 1 diastolic dysfunction).There was no evidence of elevated ventricular filling pressure byDoppler parameters. - Aortic valve: Trileaflet; normal thickness leaflets. There wasmild regurgitation. - Aortic root: The aortic root was normal in size. - Mitral valve: Mildly thickened leaflets . - Right ventricle: The cavity size was normal. Wall thickness wasnormal. Systolic function was normal. - Right atrium: The atrium was normal in size. - Tricuspid valve: There was trivial regurgitation. - Pulmonary arteries: Systolic pressure was within the normalrange. - Inferior vena cava: The vessel was normal in size. - Pericardium, extracardiac: There was no pericardial effusion.     Neuro/Psych PSYCHIATRIC DISORDERS Anxiety negative neurological ROS     GI/Hepatic GERD  Medicated and Controlled,(+)     substance abuse  alcohol use, Hepatitis -, CShort-segment  Barret's esophagus; esophageal cancer   Endo/Other  negative endocrine ROS  Renal/GU negative Renal ROS     Musculoskeletal  (+) Arthritis ,   Abdominal   Peds  Hematology  (+) Blood dyscrasia, anemia , Platelets 103k   Anesthesia Other Findings   Reproductive/Obstetrics negative OB ROS                            Anesthesia Physical Anesthesia Plan  ASA: III  Anesthesia Plan: MAC   Post-op Pain Management:    Induction: Intravenous  Airway Management Planned: Nasal Cannula  Additional Equipment:   Intra-op Plan:   Post-operative Plan:   Informed Consent: I have reviewed the patients History and Physical, chart, labs and discussed the procedure including the risks, benefits and alternatives for the proposed anesthesia with the patient or authorized representative who has indicated his/her understanding and acceptance.   Dental advisory given  Plan Discussed with: CRNA  Anesthesia Plan Comments: (Discussed risks/benefits/alternatives to MAC sedation including need for ventilatory support, hypotension, need for conversion to general anesthesia.  All patient questions answered.  Patient wished to proceed.)        Anesthesia Quick Evaluation

## 2015-06-26 NOTE — H&P (Signed)
[facility] History and Physical   Primary Care Physician:  Pcp Not In System   Reason for Procedure:   Place gastrostomy in esophageal cancer patient with dysphagia  Plan:    EGD and PEG     HPI: Patrick [name] is a 64 y.o. 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 He is having worsening dysphagia and oncologist is concerned that Mr. Hollands will not maintain nutrition and hydration. Plan is for PEG. And abd US shows cirrhosis but no ascites.   Past Medical History  Diagnosis Date  . MI (myocardial infarction) ([place])     x3, jan, aug, nov 2011; treated at [facility] in [place]  . Sexual dysfunction   . HTN (hypertension)   . Hyperlipidemia   . Chronic chest pain   . CAD (coronary artery disease)   . Thrombocytopenia ([place])   . SSBE (short-segment Barrett's esophagus) 11/11/11  . Colon adenomas 11/11/11    x 5, one rectal tv adenoma  . GERD (gastroesophageal reflux disease)   . Chronic hepatitis C with cirrhosis ([place]) 01/02/2012  . Barrett's esophagus 11/17/2011    Short-segment dx 11/2011   . Arthritis     knees  . Cataract     bil removed  . Anemia   . Alcoholism ([place]) 01/02/2012    cocaine abuse, herion abuse in past    Past Surgical History  Procedure Laterality Date  . Hernia repair      very young  . Balloon angioplasty, artery  03/2010  . Colonoscopy  11/11/11    Dr. Silvano Rusk  . Esophagogastroduodenoscopy  11/11/11    Dr. Silvano Rusk  . Cardiac catheterization  01/31/2011  . Coronary angioplasty    . Cataract extraction w/phaco  08/18/2012    Procedure: CATARACT EXTRACTION PHACO AND INTRAOCULAR LENS PLACEMENT (IOC);  Surgeon: Adonis Brook, MD;  Location: [place];  Service: Ophthalmology;  Laterality: Right;  . Cataract extraction w/phaco Left 09/22/2012    Procedure: CATARACT EXTRACTION PHACO AND INTRAOCULAR LENS PLACEMENT (IOC);  Surgeon: Adonis Brook, MD;  Location: [place];  Service: Ophthalmology;  Laterality: Left;  . Upper  gastrointestinal endoscopy    . Esophagogastroduodenoscopy (egd) with propofol N/A 04/27/2015    Procedure: ESOPHAGOGASTRODUODENOSCOPY (EGD) WITH PROPOFOL;  Surgeon: Jerene Bears, MD;  [facility] ENDOSCOPY;  Service: Endoscopy;  Laterality: N/A;    Prior to Admission medications   Medication Sig Start Date End Date Taking? Authorizing Provider  aspirin 81 MG tablet Take 81 mg by mouth daily.    Yes Historical Provider, MD  carvedilol (COREG) 12.5 MG tablet Take 12.5 mg by mouth 2 (two) times daily with a meal. 09/05/12  Yes Brooke O Edmisten, PA-C  feeding supplement (BOOST / RESOURCE BREEZE) LIQD Take 1 Container by mouth 3 (three) times daily between meals. 04/30/15  Yes Thurnell Lose, MD  folic acid (FOLVITE) 1 MG tablet Take 1 tablet (1 mg total) by mouth daily. 04/30/15  Yes Thurnell Lose, MD  furosemide (LASIX) 20 MG tablet Take 1 tablet as needed for fluid retention 04/02/15  Yes Brittainy Erie Noe, PA-C  HYDROcodone-acetaminophen (HYCET) 7.5-325 mg/15 ml solution Take 10 mLs by mouth 2 (two) times daily as needed for moderate pain. 06/25/15  Yes Ladell Pier, MD  lisinopril (PRINIVIL,ZESTRIL) 20 MG tablet Take 20 mg by mouth daily.    Yes Historical Provider, MD  omeprazole (PRILOSEC) 20 MG capsule Take 1 capsule (20 mg total) by mouth 2 (two) times daily. 03/08/15  Yes Ladene Artist, MD  polyethylene glycol [facility] / GLYCOLAX) packet Take 17 g by mouth daily as needed for mild constipation.  03/23/15  Yes Historical Provider, MD  atorvastatin (LIPITOR) 40 MG tablet Take 1 tablet (40 mg total) by mouth daily at 6 PM. 11/30/12   Timmothy Euler, MD  nitroGLYCERIN (NITROSTAT) 0.4 MG SL tablet Place 1 tablet (0.4 mg total) under the tongue every 5 (five) minutes as needed for chest pain. Patient not taking: Reported on 06/15/2015 03/03/15   Brittainy M Rosita Fire, PA-C  prochlorperazine (COMPAZINE) 10 MG tablet Take 1 tablet (10 mg total) by mouth every 6 (six) hours as needed for nausea  or vomiting. Patient not taking: Reported on 06/15/2015 06/06/15   Owens Shark, NP  sucralfate (CARAFATE) 1 G tablet Take 1 tablet (1 g total) by mouth 4 (four) times daily. Patient not taking: Reported on 06/22/2015 06/15/15   Kyung Rudd, MD  sucralfate (CARAFATE) 1 G tablet Take 1 tablet (1 g total) by mouth 4 (four) times daily. 06/22/15   Kyung Rudd, MD  thiamine 100 MG tablet Take 1 tablet (100 mg total) by mouth daily. Patient not taking: Reported on 06/15/2015 04/30/15   Thurnell Lose, MD  traMADol (ULTRAM) 50 MG tablet Take 50 mg by mouth every 12 (twelve) hours as needed for moderate pain.  03/22/15   Historical Provider, MD  triamcinolone cream (KENALOG) 0.1 % Apply 1 application topically 2 (two) times daily as needed (apply to rash).  04/14/15   Historical Provider, MD  Wound Dressings (SONAFINE) Apply 1 application topically daily.    Historical Provider, MD    Current Facility-Administered Medications  Medication Dose Route Frequency Provider Last Rate Last Dose  . 0.9 %  sodium chloride infusion   Intravenous Continuous Gatha Mayer, MD      . ceFAZolin (ANCEF) powder 1 g  1 g Other Once Gatha Mayer, MD        Allergies as of 06/25/2015  . (No Known Allergies)    Family History  Problem Relation Age of Onset  . Cancer      mother's side/fathers side, 2 uncles  . Heart attack Maternal Grandfather   . Diabetes Sister   . Stroke Sister   . Colon cancer Neg Hx   . Esophageal cancer Neg Hx   . Rectal cancer Neg Hx   . Stomach cancer Neg Hx     Social History   Social History  . Marital Status: Single    Spouse Name: N/A  . Number of Children: 3  . Years of Education: N/A   Occupational History  .      Unemployed   Social History Main Topics  . Smoking status: Current Some Day Smoker -- 0.10 packs/day for 45 years    Types: Cigarettes  . Smokeless tobacco: Never Used     Comment: 1-2 cig/day  . Alcohol Use: Yes     Comment: Pt reports drinking 2  bottles of wine tonight. "I drink a 5th of wine daily".  . Drug Use: Yes     Comment: Cocaine + 08/2012. Previous heroin, cocaine and marijuana abuse.   Marland Kitchen Sexual Activity: Not Currently   Other Topics Concern  . Not on file   Social History Narrative   Single, lives alone   Poor social support-depends on others for transportation   Fall Risk-ambulates w/cane. Has rolling walker at home   Chronic depression    Review of Systems:  Positive for some weight loss. All other review of systems negative except as mentioned in the HPI.  Physical Exam: Vital signs in last 24 hours: Temp:  [97.8 F (36.6 C)] 97.8 F (36.6 C) (11/22 1114) Pulse Rate:  [72] 72 (11/22 1114) Resp:  [20] 20 (11/22 1114) BP: (130)/(81) 130/81 mmHg (11/22 1114) Weight:  [151 lb 11.2 oz (68.811 kg)] 151 lb 11.2 oz (68.811 kg) (11/22 1114)   General:   Alert,  Well-developed, well-nourished, pleasant and cooperative in NAD Lungs:  Clear throughout to auscultation.   Heart:  Regular rate and rhythm; no murmurs, clicks, rubs,  or gallops. Abdomen:  Soft, nontender and nondistended. Normal bowel sounds.   Neuro/Psych:  Alert and cooperative. Normal mood and affect. A and O x 3   @Carl  Simonne Maffucci, MD, [facility] [phone] (pager) 06/26/2015 12:41 PM@

## 2015-06-26 NOTE — Progress Notes (Signed)
   Called about patient being confused and trying to leave hospital. Had been walking around unsteady.  I have spoken to him on phone and he does not remember that he was to spend the night and wants to leave the hospital.  RN and I think he may be withdrawing from alcohol - will treat as such and ask hospitalist to see also.  Gatha Mayer, MD, [facility] [phone] (pager) 06/26/2015 6:48 PM

## 2015-06-26 NOTE — Consult Note (Signed)
[facility] History and Physical Consultation note  Patrick [name] [id number] DOB: [birth year] DOA: 06/26/2015  Referring physician: ED physician PCP: Pcp Not In System  Specialists:   Chief Complaint: Altered mental status  Brief description of medical history: Patrick [name] is a 64 y.o. male with PMH of hypertension, hyperlipidemia, GERD, CAD, thrombocytopenia, HCV, cirrhosis without sinusitis, alchol abuse, tobacco abuse, SCCA, who was admitted for PEG placement on 06/26/15.   Per Dr. Carlean Purl, pt has SCCA of proximal esophagus. He is having worsening dysphagia and oncologist is concerned that Patrick [name] will not maintain nutrition and hydration. Therefore he was admitted and have PEG tube placed today. After the procedure , pt became confused and combative. He refuses medications. Per nurse, pt calmed down after received Ativan, but remains confused. When I saw pt on the floor, he is not oriented x 3. Can not give any meaningful history. He moves all extremities. He does not seem to have tenderness over abdomen. No diarrhea per nurse.  He was found to have WBC 2.1, platelet 81, hemoglobin 9.0, temperature normal, tachypnea, oxygen saturation 98% on room air, heart rate 85-99, electrolytes and renal function okay, INR 1.10. US-abdomen showed gallbladder wall thickening is noted. This can be seen with hypoproteinemic states. Acalculous cholecystitis cannot becompletely excluded. No gallstones noted. No pericholecystic fluid. Negative Murphy sign. No biliary distention. Liver has a coarse echotexture consistent with known cirrhosis. No focal hepatic abnormality identified. 3. Right renal atrophy again noted .   Where does patient live?   At home  Can patient participate in ADLs?  Some   Review of Systems: Could not be reviewed the due to altered mental status.  Allergy: No Known Allergies  Past Medical History  Diagnosis Date  . MI (myocardial infarction) ([place])     x3, jan, aug,  nov 2011; treated at [facility] in [place]  . Sexual dysfunction   . HTN (hypertension)   . Hyperlipidemia   . Chronic chest pain   . CAD (coronary artery disease)   . Thrombocytopenia ([place])   . SSBE (short-segment Barrett's esophagus) 11/11/11  . Colon adenomas 11/11/11    x 5, one rectal tv adenoma  . GERD (gastroesophageal reflux disease)   . Chronic hepatitis C with cirrhosis ([place]) 01/02/2012  . Barrett's esophagus 11/17/2011    Short-segment dx 11/2011   . Arthritis     knees  . Cataract     bil removed  . Anemia   . Alcoholism ([place]) 01/02/2012    cocaine abuse, herion abuse in past    Past Surgical History  Procedure Laterality Date  . Hernia repair      very young  . Balloon angioplasty, artery  03/2010  . Colonoscopy  11/11/11    Dr. Silvano Rusk  . Esophagogastroduodenoscopy  11/11/11    Dr. Silvano Rusk  . Cardiac catheterization  01/31/2011  . Coronary angioplasty    . Cataract extraction w/phaco  08/18/2012    Procedure: CATARACT EXTRACTION PHACO AND INTRAOCULAR LENS PLACEMENT (IOC);  Surgeon: Adonis Brook, MD;  Location: [place];  Service: Ophthalmology;  Laterality: Right;  . Cataract extraction w/phaco Left 09/22/2012    Procedure: CATARACT EXTRACTION PHACO AND INTRAOCULAR LENS PLACEMENT (IOC);  Surgeon: Adonis Brook, MD;  Location: [facility];  Service: Ophthalmology;  Laterality: Left;  . Upper gastrointestinal endoscopy    . Esophagogastroduodenoscopy (egd) with propofol N/A 04/27/2015    Procedure: ESOPHAGOGASTRODUODENOSCOPY (EGD) WITH PROPOFOL;  Surgeon: Jerene Bears, MD;  Location: [facility];  Service:  Endoscopy;  Laterality: N/A;    Social History:  reports that he has been smoking Cigarettes.  He has a 4.5 pack-year smoking history. He has never used smokeless tobacco. He reports that he drinks alcohol. He reports that he uses illicit drugs.  Family History:  Family History  Problem Relation Age of Onset  . Cancer      mother's side/fathers side, 2 uncles  . Heart  attack Maternal Grandfather   . Diabetes Sister   . Stroke Sister   . Colon cancer Neg Hx   . Esophageal cancer Neg Hx   . Rectal cancer Neg Hx   . Stomach cancer Neg Hx      Prior to Admission medications   Medication Sig Start Date End Date Taking? Authorizing Provider  aspirin 81 MG tablet Take 81 mg by mouth daily.    Yes Historical Provider, MD  carvedilol (COREG) 12.5 MG tablet Take 12.5 mg by mouth 2 (two) times daily with a meal. 09/05/12  Yes Brooke O Edmisten, PA-C  feeding supplement (BOOST / RESOURCE BREEZE) LIQD Take 1 Container by mouth 3 (three) times daily between meals. 04/30/15  Yes Thurnell Lose, MD  folic acid (FOLVITE) 1 MG tablet Take 1 tablet (1 mg total) by mouth daily. 04/30/15  Yes Thurnell Lose, MD  furosemide (LASIX) 20 MG tablet Take 1 tablet as needed for fluid retention 04/02/15  Yes Brittainy M Simmons, PA-C  gabapentin (NEURONTIN) 300 MG capsule Take 300 mg by mouth 3 (three) times daily. 06/08/15  Yes Historical Provider, MD  HYDROcodone-acetaminophen (HYCET) 7.5-325 mg/15 ml solution Take 10 mLs by mouth 2 (two) times daily as needed for moderate pain. 06/25/15  Yes Ladell Pier, MD  lisinopril (PRINIVIL,ZESTRIL) 20 MG tablet Take 20 mg by mouth daily.    Yes Historical Provider, MD  omeprazole (PRILOSEC) 20 MG capsule Take 1 capsule (20 mg total) by mouth 2 (two) times daily. 03/08/15  Yes Ladene Artist, MD  oxyCODONE-acetaminophen (PERCOCET) 7.5-325 MG tablet Take 1 tablet by mouth every 6 (six) hours as needed. Pain. 06/18/15  Yes Historical Provider, MD  polyethylene glycol (MIRALAX / GLYCOLAX) packet Take 17 g by mouth daily as needed for mild constipation.  03/23/15  Yes Historical Provider, MD  atorvastatin (LIPITOR) 40 MG tablet Take 1 tablet (40 mg total) by mouth daily at 6 PM. 11/30/12   Timmothy Euler, MD  nitroGLYCERIN (NITROSTAT) 0.4 MG SL tablet Place 1 tablet (0.4 mg total) under the tongue every 5 (five) minutes as needed for chest  pain. Patient not taking: Reported on 06/15/2015 03/03/15   Brittainy M Rosita Fire, PA-C  prochlorperazine (COMPAZINE) 10 MG tablet Take 1 tablet (10 mg total) by mouth every 6 (six) hours as needed for nausea or vomiting. Patient not taking: Reported on 06/15/2015 06/06/15   Owens Shark, NP  sucralfate (CARAFATE) 1 G tablet Take 1 tablet (1 g total) by mouth 4 (four) times daily. Patient not taking: Reported on 06/22/2015 06/15/15   Kyung Rudd, MD  sucralfate (CARAFATE) 1 G tablet Take 1 tablet (1 g total) by mouth 4 (four) times daily. 06/22/15   Kyung Rudd, MD  thiamine 100 MG tablet Take 1 tablet (100 mg total) by mouth daily. Patient not taking: Reported on 06/15/2015 04/30/15   Thurnell Lose, MD  triamcinolone cream (KENALOG) 0.1 % Apply 1 application topically 2 (two) times daily as needed (apply to rash).  04/14/15   Historical Provider, MD  Physical Exam: Filed Vitals:   06/26/15 1410 06/26/15 1420 06/26/15 1445 06/26/15 2112  BP: 143/84 151/86 153/83 133/81  Pulse: 84 86 85 88  Temp:   97.2 F (36.2 C) 98 F (36.7 C)  TempSrc:   Oral Oral  Resp: 13 15 16 16   Height:   5\' 9"  (1.753 m)   Weight:   66.2 kg (145 lb 15.1 oz)   SpO2: 95% 97% 100% 99%   General: Not in acute distress HEENT:       Eyes: PERRL, EOMI, no scleral icterus.       ENT: No discharge from the ears and nose, no pharynx injection, no tonsillar enlargement.        Neck: No JVD, no bruit, no mass felt. Heme: No neck lymph node enlargement. Cardiac: S1/S2, RRR, No murmurs, No gallops or rubs. Pulm: has diffused rhonchi bilaterally. Abd: Soft, nondistended, no organomegaly, BS present. Has PEG tube placed. Ext: No pitting leg edema bilaterally. 2+DP/PT pulse bilaterally. Musculoskeletal: No joint deformities, No joint redness or warmth, no limitation of ROM in spin. Skin: No rashes.  Neuro: confused, not oriented X3, cranial nerves II-XII grossly intact, moves all extremities.  Psych: Patient is not  psychotic, no suicidal or hemocidal ideation.  Labs on Admission:  Basic Metabolic Panel:  Recent Labs Lab 06/25/15 1014 06/26/15 1510  NA 139  --   K 4.1  --   CO2 20*  --   GLUCOSE 148*  --   BUN 12.8  --   CREATININE 1.2 0.88  CALCIUM 9.4  --    Liver Function Tests:  Recent Labs Lab 06/25/15 1014  AST 75*  ALT 72*  ALKPHOS 103  BILITOT 0.65  PROT 7.8  ALBUMIN 3.0*   No results for input(s): LIPASE, AMYLASE in the last 168 hours. No results for input(s): AMMONIA in the last 168 hours. CBC:  Recent Labs Lab 06/25/15 1014 06/26/15 1510  WBC 2.4* 2.1*  NEUTROABS 1.1*  --   HGB 10.3* 9.0*  HCT 31.2* 27.2*  MCV 108.9* 105.8*  PLT 103* 81*   Cardiac Enzymes: No results for input(s): CKTOTAL, CKMB, CKMBINDEX, TROPONINI in the last 168 hours.  BNP (last 3 results) No results for input(s): BNP in the last 8760 hours.  ProBNP (last 3 results) No results for input(s): PROBNP in the last 8760 hours.  CBG: No results for input(s): GLUCAP in the last 168 hours.  Radiological Exams on Admission: US Abdomen Complete  06/26/2015  CLINICAL DATA:  Abdominal pain. EXAM: ULTRASOUND ABDOMEN COMPLETE COMPARISON:  PET-CT 05/25/2015.  Ultrasound 04/26/2015. FINDINGS: Gallbladder: No gallstones noted. Gallbladder wall thickening is noted with gallbladder wall measuring 3.4 mm. This could be seen with hypoproteinemic states. Acalculous cholecystitis cannot be completely excluded. No pericholecystic fluid collection. Negative Murphy's sign. Common bile duct: Diameter: 2.0 mm Liver: Liver has a coarse echotexture consistent with known cirrhosis. No focal hepatic abnormality. IVC: No abnormality visualized. Pancreas: Visualized portion unremarkable. Spleen: Size and appearance within normal limits. Right Kidney: Length: 6.0 cm. Echogenicity within normal limits. No mass or hydronephrosis visualized right kidney not well visualized. Left Kidney: Length: 13.0 cm. Echogenicity within  normal limits. No mass or hydronephrosis visualized. Abdominal aorta: No aneurysm visualized. Other findings: None. IMPRESSION: 1. Gallbladder wall thickening is noted. This can be seen with hypoproteinemic states. Acalculous cholecystitis cannot be completely excluded. No gallstones noted. No pericholecystic fluid. Negative Murphy sign. No biliary distention. 2. Liver has a coarse echotexture consistent with known cirrhosis. No  focal hepatic abnormality identified. 3. Right renal atrophy again noted . Electronically Signed   By: Marcello Moores  Register   On: 06/26/2015 09:32    EKG Not done, will get one.   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 Delirium. A potential differential diagnosis is pulmonary embolism given patient's ongoing surgery and esophageal cancer, but the patient has normal oxygen saturation. His heart rate reached 115, which improved to 85 after he received Ativan. Currently no tachycardia, I think less likely that patient has PE. Another possibility is hepatic encephalopathy given her history of liver cirrhosis and hepatitis C  -will continue CIWA protocol -tele monitoring -Pulse oximetry continuously. If patient desaturated, will do CTA to r/o PE (not ordered) -Frequent neurochecks -check ammonium level -start lactulose per rectal empirically  CAD: not sure whether pt has CP selecting with treating him empirically with lactulose. -check EKG -Continue aspirin, Lipitor and Coreg -trop x 3  HLD: Last LDL was 98 on 04/26/15 -Continue home medications: Lipitor  Tobacco  abuse: -Nicotine patch  Thrombocytopenia ([place]): Platelet H1, most likely due to cirrhosis. No bleeding tendency.  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 Clarice Pole. He is getting radiation and weekly Taxol/carboplatin since 06/11/2015.  -follow up with Dr. Clarice Pole  Esophageal dysphagia: s/p of PEG. Patient has diffused rhonchi bilaterally, need to rule out aspiration pneumonia  -CXR -Duoneb neb prn  DVT ppx: per surgery team   Code Status: Full code Family Communication: None at bed side.    Disposition Plan: Admit to inpatient   Date of Service 06/26/2015    Ivor Costa [facility] Pager [phone]  If 7PM-7AM, please contact night-coverage [email or web address] Password [facility] 06/26/2015, 9:51 PM

## 2015-06-27 ENCOUNTER — Ambulatory Visit
Admit: 2015-06-27 | Discharge: 2015-06-27 | Disposition: A | Discharge status: Home / Self Care | Payer: Medicare HMO | Attending: Radiation Oncology | Admitting: Radiation Oncology

## 2015-06-27 ENCOUNTER — Encounter (HOSPITAL_COMMUNITY): Payer: Self-pay | Admitting: Internal Medicine

## 2015-06-27 ENCOUNTER — Observation Stay (HOSPITAL_COMMUNITY): Payer: Medicare HMO

## 2015-06-27 ENCOUNTER — Encounter: Payer: Self-pay | Admitting: *Deleted

## 2015-06-27 ENCOUNTER — Ambulatory Visit: Payer: Medicare HMO

## 2015-06-27 ENCOUNTER — Telehealth: Payer: Self-pay | Admitting: *Deleted

## 2015-06-27 DIAGNOSIS — I2510 Atherosclerotic heart disease of native coronary artery without angina pectoris: Secondary | ICD-10-CM | POA: Diagnosis present

## 2015-06-27 DIAGNOSIS — D6959 Other secondary thrombocytopenia: Secondary | ICD-10-CM | POA: Diagnosis present

## 2015-06-27 DIAGNOSIS — M13861 Other specified arthritis, right knee: Secondary | ICD-10-CM | POA: Diagnosis present

## 2015-06-27 DIAGNOSIS — G9340 Encephalopathy, unspecified: Secondary | ICD-10-CM | POA: Diagnosis not present

## 2015-06-27 DIAGNOSIS — R1314 Dysphagia, pharyngoesophageal phase: Secondary | ICD-10-CM

## 2015-06-27 DIAGNOSIS — Z9861 Coronary angioplasty status: Secondary | ICD-10-CM | POA: Diagnosis not present

## 2015-06-27 DIAGNOSIS — R633 Feeding difficulties: Secondary | ICD-10-CM

## 2015-06-27 DIAGNOSIS — Z79899 Other long term (current) drug therapy: Secondary | ICD-10-CM | POA: Diagnosis not present

## 2015-06-27 DIAGNOSIS — R410 Disorientation, unspecified: Secondary | ICD-10-CM | POA: Diagnosis not present

## 2015-06-27 DIAGNOSIS — F1020 Alcohol dependence, uncomplicated: Secondary | ICD-10-CM | POA: Diagnosis present

## 2015-06-27 DIAGNOSIS — D649 Anemia, unspecified: Secondary | ICD-10-CM | POA: Diagnosis present

## 2015-06-27 DIAGNOSIS — Z008 Encounter for other general examination: Secondary | ICD-10-CM | POA: Diagnosis not present

## 2015-06-27 DIAGNOSIS — I10 Essential (primary) hypertension: Secondary | ICD-10-CM | POA: Diagnosis present

## 2015-06-27 DIAGNOSIS — B182 Chronic viral hepatitis C: Secondary | ICD-10-CM | POA: Diagnosis not present

## 2015-06-27 DIAGNOSIS — C153 Malignant neoplasm of upper third of esophagus: Secondary | ICD-10-CM | POA: Diagnosis present

## 2015-06-27 DIAGNOSIS — Z8249 Family history of ischemic heart disease and other diseases of the circulatory system: Secondary | ICD-10-CM | POA: Diagnosis not present

## 2015-06-27 DIAGNOSIS — I252 Old myocardial infarction: Secondary | ICD-10-CM | POA: Diagnosis not present

## 2015-06-27 DIAGNOSIS — R1319 Other dysphagia: Secondary | ICD-10-CM | POA: Diagnosis present

## 2015-06-27 DIAGNOSIS — N539 Unspecified male sexual dysfunction: Secondary | ICD-10-CM | POA: Diagnosis present

## 2015-06-27 DIAGNOSIS — F17210 Nicotine dependence, cigarettes, uncomplicated: Secondary | ICD-10-CM | POA: Diagnosis present

## 2015-06-27 DIAGNOSIS — Z86010 Personal history of colonic polyps: Secondary | ICD-10-CM | POA: Diagnosis not present

## 2015-06-27 DIAGNOSIS — M13862 Other specified arthritis, left knee: Secondary | ICD-10-CM | POA: Diagnosis present

## 2015-06-27 DIAGNOSIS — F329 Major depressive disorder, single episode, unspecified: Secondary | ICD-10-CM | POA: Diagnosis present

## 2015-06-27 DIAGNOSIS — E785 Hyperlipidemia, unspecified: Secondary | ICD-10-CM | POA: Diagnosis present

## 2015-06-27 DIAGNOSIS — J9811 Atelectasis: Secondary | ICD-10-CM | POA: Diagnosis present

## 2015-06-27 DIAGNOSIS — K219 Gastro-esophageal reflux disease without esophagitis: Secondary | ICD-10-CM | POA: Diagnosis present

## 2015-06-27 DIAGNOSIS — K7460 Unspecified cirrhosis of liver: Secondary | ICD-10-CM | POA: Diagnosis present

## 2015-06-27 DIAGNOSIS — B1920 Unspecified viral hepatitis C without hepatic coma: Secondary | ICD-10-CM | POA: Diagnosis present

## 2015-06-27 DIAGNOSIS — Z7982 Long term (current) use of aspirin: Secondary | ICD-10-CM | POA: Diagnosis not present

## 2015-06-27 LAB — COMPREHENSIVE METABOLIC PANEL
ALBUMIN: 2.9 g/dL — AB (ref 3.5–5.0)
ALK PHOS: 84 U/L (ref 38–126)
ALT: 69 U/L — ABNORMAL HIGH (ref 17–63)
ANION GAP: 9 (ref 5–15)
AST: 104 U/L — ABNORMAL HIGH (ref 15–41)
BUN: 9 mg/dL (ref 6–20)
CALCIUM: 9.5 mg/dL (ref 8.9–10.3)
CHLORIDE: 116 mmol/L — AB (ref 101–111)
CO2: 23 mmol/L (ref 22–32)
Creatinine, Ser: 0.8 mg/dL (ref 0.61–1.24)
GFR calc non Af Amer: >60 mL/min (ref >60)
Glucose, Bld: 118 mg/dL — ABNORMAL HIGH (ref 65–99)
POTASSIUM: 4.3 mmol/L (ref 3.5–5.1)
SODIUM: 148 mmol/L — AB (ref 135–145)
Total Bilirubin: 0.7 mg/dL (ref 0.3–1.2)
Total Protein: 7.4 g/dL (ref 6.5–8.1)

## 2015-06-27 LAB — TROPONIN I
Troponin I: <0.03 ng/mL (ref <0.031)
Troponin I: <0.03 ng/mL (ref <0.031)

## 2015-06-27 LAB — AMMONIA: Ammonia: 11 umol/L (ref 9–35)

## 2015-06-27 LAB — RAPID URINE DRUG SCREEN, HOSP PERFORMED
AMPHETAMINES: NOT DETECTED
BENZODIAZEPINES: POSITIVE — AB
Barbiturates: NOT DETECTED
COCAINE: NOT DETECTED
Opiates: POSITIVE — AB
Tetrahydrocannabinol: NOT DETECTED

## 2015-06-27 LAB — CBC
HCT: 29.8 % — ABNORMAL LOW (ref 39.0–52.0)
Hemoglobin: 10 g/dL — ABNORMAL LOW (ref 13.0–17.0)
MCH: 35.6 pg — AB (ref 26.0–34.0)
MCHC: 33.6 g/dL (ref 30.0–36.0)
MCV: 106 fL — AB (ref 78.0–100.0)
PLATELETS: 77 10*3/uL — AB (ref 150–400)
RBC: 2.81 MIL/uL — ABNORMAL LOW (ref 4.22–5.81)
RDW: 17.4 % — AB (ref 11.5–15.5)
WBC: 2.7 10*3/uL — AB (ref 4.0–10.5)

## 2015-06-27 LAB — GLUCOSE, CAPILLARY
GLUCOSE-CAPILLARY: 98 mg/dL (ref 65–99)
Glucose-Capillary: 120 mg/dL — ABNORMAL HIGH (ref 65–99)

## 2015-06-27 MED ORDER — JEVITY 1.2 CAL PO LIQD: 1000.0000 mL | ORAL | Status: DC

## 2015-06-27 MED ORDER — OSMOLITE 1.5 CAL PO LIQD
1000.0000 mL | ORAL | Status: DC
  Administered 2015-06-27 – 2015-06-28 (×2): 1000 mL
  Filled 2015-06-27 (×4): qty 1000

## 2015-06-27 NOTE — Progress Notes (Signed)
[facility] Progress Note  Subjective:     Patrick [name] is a 64 y.o. 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 He has received ativan and is calm, but he reamains confused. He knows his name, but not date, where he is, etc.He was found to have WBC 2.1, platelet 81, hemoglobin 9.0, temperature normal, tachypnea, oxygen saturation 98% on room air, heart rate 85-99, electrolytes and renal function okay, INR 1.10. US-abdomen showed gallbladder wall thickening is noted. CXR with shallow inspiration with atelectasis in the lung bases.  Oxygen sat 100% this morning on RA. Ammonia <9. On lactulose. UDS positive for benzos and opiates.  Objective:  Vital signs in last 24 hours: Temp:  [97.2 F (36.2 C)-98.4 F (36.9 C)] 98.4 F (36.9 C) (11/23 0455) Pulse Rate:  [72-92] 84 (11/23 0455) Resp:  [13-32] 18 (11/23 0455) BP: (111-153)/(72-94) 133/76 mmHg (11/23 0455) SpO2:  [95 %-100 %] 100 % (11/23 0455) Weight:  [145 lb 15.1 oz (66.2 kg)-151 lb 11.2 oz (68.811 kg)] 145 lb 15.1 oz (66.2 kg) (11/22 1445)   General:   Confused,  Well-developed, in hands mittened and restrained Heart:  Regular rate and rhythm; no murmurs Pulm;rhonchi bilat Abdomen:  Soft, nondistended, has peg tube   Extremities:  Without edema. Neurologic:Confused, disoriented  Intake/Output from previous day: 11/22 0701 - 11/23 0700 In: 1747.5 [P.O.:120; I.V.:1627.5] Out: 375 [Urine:375] Intake/Output this shift:    Lab Results:  Recent Labs  06/25/15 1014 06/26/15 1510  WBC 2.4* 2.1*  HGB 10.3* 9.0*  HCT 31.2* 27.2*  PLT 103* 81*   BMET  Recent Labs  06/25/15 1014 06/26/15 1510  NA 139  --   K 4.1  --   CO2 20*  --   GLUCOSE 148*  --   BUN 12.8  --   CREATININE 1.2 0.88  CALCIUM 9.4  --    LFT  Recent Labs  06/25/15 1014  PROT 7.8  ALBUMIN 3.0*  AST 75*  ALT 72*  ALKPHOS  103  BILITOT 0.65   PT/INR  Recent Labs  06/25/15 1607  INR 1.10*      Dg Chest 2 View  06/26/2015  CLINICAL DATA:  Altered mental status. Diffuse rhonchi on physical examination. Lethargy. EXAM: CHEST  2 VIEW COMPARISON:  03/02/2015 FINDINGS: Patient is rotated, limiting the examination. Shallow inspiration with atelectasis in the lung bases particularly on the left. No focal consolidation. Normal heart size and pulmonary vascularity. Calcified and tortuous aorta. Mediastinal contours appear intact. Old right rib fractures. Vascular calcifications. IMPRESSION: Shallow inspiration with atelectasis in the lung bases. Electronically Signed   By: Lucienne Capers M.D.   On: 06/26/2015 22:46   US Abdomen Complete  06/26/2015  CLINICAL DATA:  Abdominal pain. EXAM: ULTRASOUND ABDOMEN COMPLETE COMPARISON:  PET-CT 05/25/2015.  Ultrasound 04/26/2015. FINDINGS: Gallbladder: No gallstones noted. Gallbladder wall thickening is noted with gallbladder wall measuring 3.4 mm. This could be seen with hypoproteinemic states. Acalculous cholecystitis cannot be completely excluded. No pericholecystic fluid collection. Negative Murphy's sign. Common bile duct: Diameter: 2.0 mm Liver: Liver has a coarse echotexture consistent with known cirrhosis. No focal hepatic abnormality. IVC: No abnormality visualized. Pancreas: Visualized portion unremarkable. Spleen: Size and appearance within normal limits. Right Kidney: Length: 6.0 cm. Echogenicity within normal limits. No mass or hydronephrosis visualized right kidney not well visualized. Left Kidney: Length: 13.0 cm. Echogenicity within normal limits. No  mass or hydronephrosis visualized. Abdominal aorta: No aneurysm visualized. Other findings: None. IMPRESSION: 1. Gallbladder wall thickening is noted. This can be seen with hypoproteinemic states. Acalculous cholecystitis cannot be completely excluded. No gallstones noted. No pericholecystic fluid. Negative Murphy sign. No  biliary distention. 2. Liver has a coarse echotexture consistent with known cirrhosis. No focal hepatic abnormality identified. 3. Right renal atrophy again noted . Electronically Signed   By: Marcello Moores  Register   On: 06/26/2015 09:32    ASSESSMENT/PLAN:   64 y.o. 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 Etiology not clear, but likely ETOH withdrawal. On CIWA protocol. Continue lactulose. MOnitor oxygen sat. Per Dr Carlean Purl, transfer to hospitalist service. Thrombocytopenia likely due to cirrhosis. Peg--to be eval by dietitican for initiation/management tube feeding. Pt was scheduled to see rad onc today--they have been notified that pt has been admitted.  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 Agree with interval progress note as outlined above. Case discussed with physician assistant. No problems with PEG tube. Was for discharge, but transferred to medicine for confusion. From GI standpoint recommend light abdominal binder to protect the PEG. Feedings per nutritional service. Assessment and treatment of altered mental status per medicine service. Subsequent treatment of esophageal cancer per Dr. Benay Spice and radiation oncology. GI will sign off, we are available if needed for questions or problems. Thank you  Docia Chuck. Geri Seminole., M.D. [facility] Division of Gastroenterology

## 2015-06-27 NOTE — Anesthesia Postprocedure Evaluation (Signed)
Anesthesia Post Note  Patient: Patrick [name]  Procedure(s) Performed: Procedure(s) (LRB): PERCUTANEOUS ENDOSCOPIC GASTROSTOMY (PEG) PLACEMENT (N/A)  Patient location during evaluation: PACU Anesthesia Type: MAC Level of consciousness: awake and alert Pain management: pain level controlled Vital Signs Assessment: post-procedure vital signs reviewed and stable Respiratory status: spontaneous breathing, nonlabored ventilation, respiratory function stable and patient connected to nasal cannula oxygen Cardiovascular status: stable and blood pressure returned to baseline Anesthetic complications: no    Last Vitals:  Filed Vitals:   06/26/15 2112 06/27/15 0455  BP: 133/81 133/76  Pulse: 88 84  Temp: 36.7 C 36.9 C  Resp: 16 18    Last Pain:  Filed Vitals:   06/27/15 1233  PainSc: 0-No pain                 Effie Berkshire

## 2015-06-27 NOTE — Progress Notes (Signed)
[facility] PROGRESS NOTE  Patrick [name] [id number] DOB: 1951/05/29 DOA: 06/26/2015 PCP: Pcp Not In System  Assessment/Plan: 1. ACUTE ENCEPHALOPATHY: -unclear etiology, probably delirious from alcohol abuse. CT head ordered. Normal ammonia levels.  - CIWA protocol.    2. CAD: NO chest pain.  Resume aspirin , lipitor and coreg.    3. Thrombocytopenia; Worsening after the procedure.  No bleeding evident.  Stop lovenox and aspirin and start him on scd's.    4. Malignant neoplasm of the esophagus: Further recommendations as per Dr Learta Codding.  Radiation oncology follow up with Dr Lisbeth Renshaw.    Esophageal dysphagia: S/p PEG, awaiting nutrition consult.  CXR does not show pneumonia.   Code Status: full code.  Family Communication: none at bedside.  Disposition Plan: pending PT eval and resolution of the confusion.    Consultants:  GASTROENTEROLOGY.   Procedures:  PEG TUBE PLACEMENT ON 11/22 Antibiotics:  NONE  HPI/Subjecive:  No NEW COMPLAINTS.  Objective: Filed Vitals:   06/26/15 2112 06/27/15 0455  BP: 133/81 133/76  Pulse: 88 84  Temp: 98 F (36.7 C) 98.4 F (36.9 C)  Resp: 16 18    Intake/Output Summary (Last 24 hours) at 06/27/15 1645 Last data filed at 06/27/15 0931  Gross per 24 hour  Intake 1147.5 ml  Output    450 ml  Net  697.5 ml   Filed Weights   06/26/15 1445  Weight: 66.2 kg (145 lb 15.1 oz)    Exam:   General:  Alert but confused  Cardiovascular: s1s2  Respiratory: ctab  Abdomen: soft NT nd bs+  Musculoskeletal: nop edal edema.   Data Reviewed: Basic Metabolic Panel:  Recent Labs Lab 06/25/15 1014 06/26/15 1510 06/27/15 1137  NA 139  --  148*  K 4.1  --  4.3  CL  --   --  116*  CO2 20*  --  23  GLUCOSE 148*  --  118*  BUN 12.8  --  9  CREATININE 1.2 0.88 0.80  CALCIUM 9.4  --  9.5   Liver Function Tests:  Recent Labs Lab 06/25/15 1014 06/27/15 1137  AST 75* 104*  ALT 72* 69*  ALKPHOS 103 84   BILITOT 0.65 0.7  PROT 7.8 7.4  ALBUMIN 3.0* 2.9*   No results for input(s): LIPASE, AMYLASE in the last 168 hours.  Recent Labs Lab 06/26/15 2245 06/27/15 1137  AMMONIA <9* 11   CBC:  Recent Labs Lab 06/25/15 1014 06/26/15 1510 06/27/15 1137  WBC 2.4* 2.1* 2.7*  NEUTROABS 1.1*  --   --   HGB 10.3* 9.0* 10.0*  HCT 31.2* 27.2* 29.8*  MCV 108.9* 105.8* 106.0*  PLT 103* 81* 77*   Cardiac Enzymes:  Recent Labs Lab 06/26/15 2245 06/27/15 0309 06/27/15 1003  TROPONINI <0.03 <0.03 <0.03   BNP (last 3 results) No results for input(s): BNP in the last 8760 hours.  ProBNP (last 3 results) No results for input(s): PROBNP in the last 8760 hours.  CBG: No results for input(s): GLUCAP in the last 168 hours.  No results found for this or any previous visit (from the past 240 hour(s)).   Studies: Dg Chest 2 View  06/26/2015  CLINICAL DATA:  Altered mental status. Diffuse rhonchi on physical examination. Lethargy. EXAM: CHEST  2 VIEW COMPARISON:  03/02/2015 FINDINGS: Patient is rotated, limiting the examination. Shallow inspiration with atelectasis in the lung bases particularly on the left. No focal consolidation. Normal heart size and pulmonary vascularity. Calcified and tortuous aorta. Mediastinal  contours appear intact. Old right rib fractures. Vascular calcifications. IMPRESSION: Shallow inspiration with atelectasis in the lung bases. Electronically Signed   By: Lucienne Capers M.D.   On: 06/26/2015 22:46   US Abdomen Complete  06/26/2015  CLINICAL DATA:  Abdominal pain. EXAM: ULTRASOUND ABDOMEN COMPLETE COMPARISON:  PET-CT 05/25/2015.  Ultrasound 04/26/2015. FINDINGS: Gallbladder: No gallstones noted. Gallbladder wall thickening is noted with gallbladder wall measuring 3.4 mm. This could be seen with hypoproteinemic states. Acalculous cholecystitis cannot be completely excluded. No pericholecystic fluid collection. Negative Murphy's sign. Common bile duct: Diameter: 2.0  mm Liver: Liver has a coarse echotexture consistent with known cirrhosis. No focal hepatic abnormality. IVC: No abnormality visualized. Pancreas: Visualized portion unremarkable. Spleen: Size and appearance within normal limits. Right Kidney: Length: 6.0 cm. Echogenicity within normal limits. No mass or hydronephrosis visualized right kidney not well visualized. Left Kidney: Length: 13.0 cm. Echogenicity within normal limits. No mass or hydronephrosis visualized. Abdominal aorta: No aneurysm visualized. Other findings: None. IMPRESSION: 1. Gallbladder wall thickening is noted. This can be seen with hypoproteinemic states. Acalculous cholecystitis cannot be completely excluded. No gallstones noted. No pericholecystic fluid. Negative Murphy sign. No biliary distention. 2. Liver has a coarse echotexture consistent with known cirrhosis. No focal hepatic abnormality identified. 3. Right renal atrophy again noted . Electronically Signed   By: [place]   On: 06/26/2015 09:32    Scheduled Meds: . aspirin  81 mg Oral Daily  . atorvastatin  40 mg Oral q1800  . carvedilol  12.5 mg Oral BID WC  . enoxaparin (LOVENOX) injection  40 mg Subcutaneous Q24H  . folic acid  1 mg Oral Daily  . lisinopril  20 mg Oral Daily  . LORazepam  0-4 mg Intravenous Q6H   Followed by  . [START ON 06/28/2015] LORazepam  0-4 mg Intravenous Q12H  . multivitamin with minerals  1 tablet Oral Daily  . nicotine  21 mg Transdermal Daily  . pantoprazole  40 mg Oral Q0600  . SONAFINE  1 application Topical Daily  . thiamine  100 mg Oral Daily   Or  . thiamine  100 mg Intravenous Daily   Continuous Infusions: . dextrose 5 % and 0.45 % NaCl with KCl 20 mEq/L 75 mL/hr at 06/27/15 1004  . 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     [place] [facility] Pager [phone] . If 7PM-7AM, please contact night-coverage at [email or web address], password [facility] 06/27/2015, 4:45 PM

## 2015-06-27 NOTE — Progress Notes (Signed)
Initial Nutrition Assessment  DOCUMENTATION CODES:   Not applicable  INTERVENTION:   Osmolite 1.5 @ 20, Goal Rate 62  At goal, provides: 2200 calories, 92g Pro, 1112 ml Free h2o  Pt is a refeeding risk due to low Mg already, monitor Mg, K, P MD replete as necessary  NUTRITION DIAGNOSIS:   Inadequate oral intake related to dysphagia, inability to eat as evidenced by NPO status.  GOAL:   Patient will meet greater than or equal to 90% of their needs  MONITOR:   TF tolerance, I & O's, Labs, Diet advancement  REASON FOR ASSESSMENT:   Consult Enteral/tube feeding initiation and management  ASSESSMENT:   Patrick [name] is a 64 y.o. 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 He is having worsening dysphagia and oncologist is concerned that Mr. Esquibel will not maintain nutrition and hydration. Plan is for PEG. And abd US shows cirrhosis but no ascites.  Could not awaken pt at time of visit. RD consulted for TF. Pt desires to be d/c on bolus feeds per PA note. S/P EGD & PEG 06/26/2015 Pt suffers from cirrhosis, bolus feeds would be dangerous for fluid overload/ascites. Will see how pt tolerates TF @ 56ml/hr. Increase by 10 every 12 hours to goal rate of 62.  Nutrition-Focused physical exam completed. Findings are mild fat depletion, no muscle depletion, and no edema.    Diet Order:  Diet NPO time specified  Skin:  Reviewed, no issues  Last BM:  PTA  Height:   Ht Readings from Last 1 Encounters:  06/26/15 5\' 9"  (1.753 m)    Weight:   Wt Readings from Last 1 Encounters:  06/26/15 145 lb 15.1 oz (66.2 kg)    Ideal Body Weight:  72.72 kg  BMI:  Body mass index is 21.54 kg/(m^2).  Estimated Nutritional Needs:   Kcal:  2000-2300 calories  Protein:  80 - 100 grams  Fluid:  >/= 2L  EDUCATION NEEDS:   No education needs identified at this time  Satira Anis. Atavia Poppe, MS, RD LDN After Hours/Weekend Pager [phone]

## 2015-06-27 NOTE — Telephone Encounter (Signed)
Called the 4th floor [place] for room 1437, asked RN Corbin Ade if patient ws still in restraints?'Yes on waist restraints, was given lactulose last night and has  Had episodes of bowel movements incontinence, 'she will try to take restraints off and will cll me before his 3pm rad tx time to see if he can come down.thanked RN 12:58 PM

## 2015-06-27 NOTE — Progress Notes (Signed)
Went to patient room 1427, patient is in mitten s blue soft on patient 's b/l hands, patient very drowsy, asked if he wanted to come down for rad tx today,"No, I want to skip it", informed [place] #4 patient declined rad tx today Will inform MD 2:15 PM

## 2015-06-28 LAB — BASIC METABOLIC PANEL
ANION GAP: 6 (ref 5–15)
BUN: 7 mg/dL (ref 6–20)
CO2: 23 mmol/L (ref 22–32)
Calcium: 9 mg/dL (ref 8.9–10.3)
Chloride: 116 mmol/L — ABNORMAL HIGH (ref 101–111)
Creatinine, Ser: 0.71 mg/dL (ref 0.61–1.24)
GFR calc Af Amer: >60 mL/min (ref >60)
GFR calc non Af Amer: >60 mL/min (ref >60)
GLUCOSE: 130 mg/dL — AB (ref 65–99)
POTASSIUM: 3.9 mmol/L (ref 3.5–5.1)
Sodium: 145 mmol/L (ref 135–145)

## 2015-06-28 LAB — GLUCOSE, CAPILLARY
GLUCOSE-CAPILLARY: 122 mg/dL — AB (ref 65–99)
GLUCOSE-CAPILLARY: 121 mg/dL — AB (ref 65–99)
GLUCOSE-CAPILLARY: 130 mg/dL — AB (ref 65–99)
Glucose-Capillary: 124 mg/dL — ABNORMAL HIGH (ref 65–99)
Glucose-Capillary: 119 mg/dL — ABNORMAL HIGH (ref 65–99)

## 2015-06-28 LAB — CBC
HEMATOCRIT: 25 % — AB (ref 39.0–52.0)
HEMOGLOBIN: 8.4 g/dL — AB (ref 13.0–17.0)
MCH: 35 pg — ABNORMAL HIGH (ref 26.0–34.0)
MCHC: 33.6 g/dL (ref 30.0–36.0)
MCV: 104.2 fL — AB (ref 78.0–100.0)
Platelets: 59 10*3/uL — ABNORMAL LOW (ref 150–400)
RBC: 2.4 MIL/uL — AB (ref 4.22–5.81)
RDW: 17.3 % — ABNORMAL HIGH (ref 11.5–15.5)
WBC: 1.5 10*3/uL — AB (ref 4.0–10.5)

## 2015-06-28 NOTE — Progress Notes (Signed)
[facility] PROGRESS NOTE  Patrick [name] [id number] DOB: Jul 10, 1951 DOA: 06/26/2015 PCP: Pcp Not In System  Assessment/Plan: 1. ACUTE ENCEPHALOPATHY: -unclear etiology, probably delirious from alcohol abuse. CT head ordered. Normal ammonia levels.  - CIWA protocol.  - much improved.    2. CAD: NO chest pain.  Resume aspirin , lipitor and coreg.    3. Thrombocytopenia; Worsening after the procedure.  No bleeding evident.  Stop lovenox and aspirin and start him on scd's.  Repeat cbc in am.    4. Malignant neoplasm of the esophagus: Further recommendations as per Dr Learta Codding.  Radiation oncology follow up with Dr Lisbeth Renshaw.    Esophageal dysphagia: S/p PEG, awaiting nutrition consult.  CXR does not show pneumonia.  Disposition: Discussed in detail with the patient and his sister over tht ephone.  Plan to d/c to rehab if he qualifies.    Code Status: full code.  Family Communication: none at bedside.  Disposition Plan: pending PT eval and resolution of the confusion.    Consultants:  GASTROENTEROLOGY.   Procedures:  PEG TUBE PLACEMENT ON 11/22 Antibiotics:  NONE  HPI/Subjecive:  No NEW COMPLAINTS.  Objective: Filed Vitals:   06/28/15 0404 06/28/15 1307  BP: 145/86 105/69  Pulse: 90 87  Temp: 98.2 F (36.8 C) 98.2 F (36.8 C)  Resp: 20 20    Intake/Output Summary (Last 24 hours) at 06/28/15 1631 Last data filed at 06/28/15 1630  Gross per 24 hour  Intake   2490 ml  Output    300 ml  Net   2190 ml   Filed Weights   06/26/15 1445 06/28/15 0455  Weight: 66.2 kg (145 lb 15.1 oz) 65.3 kg (143 lb 15.4 oz)    Exam:   General:  Alert but confused  Cardiovascular: s1s2  Respiratory: ctab  Abdomen: soft NT nd bs+  Musculoskeletal: nop edal edema.   Data Reviewed: Basic Metabolic Panel:  Recent Labs Lab 06/25/15 1014 06/26/15 1510 06/27/15 1137 06/28/15 0523  NA 139  --  148* 145  K 4.1  --  4.3 3.9  CL  --   --  116* 116*   CO2 20*  --  23 23  GLUCOSE 148*  --  118* 130*  BUN 12.8  --  9 7  CREATININE 1.2 0.88 0.80 0.71  CALCIUM 9.4  --  9.5 9.0   Liver Function Tests:  Recent Labs Lab 06/25/15 1014 06/27/15 1137  AST 75* 104*  ALT 72* 69*  ALKPHOS 103 84  BILITOT 0.65 0.7  PROT 7.8 7.4  ALBUMIN 3.0* 2.9*   No results for input(s): LIPASE, AMYLASE in the last 168 hours.  Recent Labs Lab 06/26/15 2245 06/27/15 1137  AMMONIA <9* 11   CBC:  Recent Labs Lab 06/25/15 1014 06/26/15 1510 06/27/15 1137 06/28/15 0523  WBC 2.4* 2.1* 2.7* 1.5*  NEUTROABS 1.1*  --   --   --   HGB 10.3* 9.0* 10.0* 8.4*  HCT 31.2* 27.2* 29.8* 25.0*  MCV 108.9* 105.8* 106.0* 104.2*  PLT 103* 81* 77* 59*   Cardiac Enzymes:  Recent Labs Lab 06/26/15 2245 06/27/15 0309 06/27/15 1003  TROPONINI <0.03 <0.03 <0.03   BNP (last 3 results) No results for input(s): BNP in the last 8760 hours.  ProBNP (last 3 results) No results for input(s): PROBNP in the last 8760 hours.  CBG:  Recent Labs Lab 06/27/15 2347 06/28/15 0355 06/28/15 0826 06/28/15 1132 06/28/15 1611  GLUCAP 120* 122* 124* 119* 121*  No results found for this or any previous visit (from the past 240 hour(s)).   Studies: Dg Chest 2 View  06/26/2015  CLINICAL DATA:  Altered mental status. Diffuse rhonchi on physical examination. Lethargy. EXAM: CHEST  2 VIEW COMPARISON:  03/02/2015 FINDINGS: Patient is rotated, limiting the examination. Shallow inspiration with atelectasis in the lung bases particularly on the left. No focal consolidation. Normal heart size and pulmonary vascularity. Calcified and tortuous aorta. Mediastinal contours appear intact. Old right rib fractures. Vascular calcifications. IMPRESSION: Shallow inspiration with atelectasis in the lung bases. Electronically Signed   By: Lucienne Capers M.D.   On: 06/26/2015 22:46   Ct Head Wo Contrast  06/27/2015  CLINICAL DATA:  Confusion of acute onset.  Esophageal carcinoma  EXAM: CT HEAD WITHOUT CONTRAST TECHNIQUE: Contiguous axial images were obtained from the base of the skull through the vertex without intravenous contrast. COMPARISON:  November 16, 2010 FINDINGS: There is mild diffuse atrophy. There is no intracranial mass, hemorrhage, extra-axial fluid collection, or midline shift. There is rather minimal small vessel disease in the centra semiovale bilaterally. Elsewhere gray-white compartments are normal. No acute infarct evident. A small amount of basal ganglia calcification may be physiologic in this age group. The bony calvarium appears intact. The mastoid air cells are clear. IMPRESSION: Mild atrophy with minimal periventricular small vessel disease. No acute infarct evident. No hemorrhage or mass effect. Electronically Signed   By: Lowella Grip III M.D.   On: 06/27/2015 17:21    Scheduled Meds: . atorvastatin  40 mg Oral q1800  . carvedilol  12.5 mg Oral BID WC  . folic acid  1 mg Oral Daily  . lisinopril  20 mg Oral Daily  . LORazepam  0-4 mg Intravenous Q6H   Followed by  . LORazepam  0-4 mg Intravenous Q12H  . multivitamin with minerals  1 tablet Oral Daily  . nicotine  21 mg Transdermal Daily  . pantoprazole  40 mg Oral Q0600  . SONAFINE  1 application Topical Daily  . thiamine  100 mg Oral Daily   Or  . thiamine  100 mg Intravenous Daily   Continuous Infusions: . dextrose 5 % and 0.45 % NaCl with KCl 20 mEq/L 75 mL/hr at 06/28/15 0000  . 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     [place] [facility] Pager  [phone] . If 7PM-7AM, please contact night-coverage at [email or web address], password [facility] 06/28/2015, 4:31 PM  LOS: 1 day

## 2015-06-29 DIAGNOSIS — K7460 Unspecified cirrhosis of liver: Secondary | ICD-10-CM

## 2015-06-29 DIAGNOSIS — B182 Chronic viral hepatitis C: Secondary | ICD-10-CM

## 2015-06-29 LAB — GLUCOSE, CAPILLARY
GLUCOSE-CAPILLARY: 107 mg/dL — AB (ref 65–99)
GLUCOSE-CAPILLARY: 121 mg/dL — AB (ref 65–99)
Glucose-Capillary: 120 mg/dL — ABNORMAL HIGH (ref 65–99)
Glucose-Capillary: 136 mg/dL — ABNORMAL HIGH (ref 65–99)

## 2015-06-29 LAB — CBC
HCT: 25.3 % — ABNORMAL LOW (ref 39.0–52.0)
HEMOGLOBIN: 8.4 g/dL — AB (ref 13.0–17.0)
MCH: 34.7 pg — ABNORMAL HIGH (ref 26.0–34.0)
MCHC: 33.2 g/dL (ref 30.0–36.0)
MCV: 104.5 fL — ABNORMAL HIGH (ref 78.0–100.0)
Platelets: 58 10*3/uL — ABNORMAL LOW (ref 150–400)
RBC: 2.42 MIL/uL — AB (ref 4.22–5.81)
RDW: 17.2 % — ABNORMAL HIGH (ref 11.5–15.5)
WBC: 1.7 10*3/uL — ABNORMAL LOW (ref 4.0–10.5)

## 2015-06-29 MED ORDER — FOLIC ACID 1 MG PO TABS: 1.0000 mg | ORAL_TABLET | Freq: Every day | ORAL | Status: DC

## 2015-06-29 MED ORDER — CARVEDILOL 12.5 MG PO TABS: 12.5000 mg | ORAL_TABLET | Freq: Two times a day (BID) | ORAL | Status: DC

## 2015-06-29 MED ORDER — LISINOPRIL 20 MG PO TABS: 20.0000 mg | ORAL_TABLET | Freq: Every day | ORAL | Status: AC

## 2015-06-29 MED ORDER — ADULT MULTIVITAMIN W/MINERALS CH: 1.0000 | ORAL_TABLET | Freq: Every day | ORAL | Status: AC

## 2015-06-29 MED ORDER — POLYETHYLENE GLYCOL 3350 17 G PO PACK: 17.0000 g | PACK | Freq: Every day | ORAL | Status: DC | PRN

## 2015-06-29 MED ORDER — ATORVASTATIN CALCIUM 40 MG PO TABS: 40.0000 mg | ORAL_TABLET | Freq: Every day | ORAL | Status: DC

## 2015-06-29 MED ORDER — FUROSEMIDE 20 MG PO TABS: 20.0000 mg | ORAL_TABLET | Freq: Every day | ORAL | Status: DC | PRN

## 2015-06-29 MED ORDER — OSMOLITE 1.5 CAL PO LIQD: 1000.0000 mL | ORAL | Status: DC

## 2015-06-29 MED ORDER — NICOTINE 21 MG/24HR TD PT24: 21.0000 mg | MEDICATED_PATCH | Freq: Every day | TRANSDERMAL | Status: DC

## 2015-06-29 MED ORDER — GABAPENTIN 300 MG PO CAPS: 300.0000 mg | ORAL_CAPSULE | Freq: Three times a day (TID) | ORAL | Status: DC

## 2015-06-29 MED ORDER — ASPIRIN 81 MG PO TABS: 81.0000 mg | ORAL_TABLET | Freq: Every day | ORAL | Status: DC

## 2015-06-29 NOTE — Progress Notes (Signed)
Patient discharged home at 1645 this shift. Discharge paperwork sent with patient along with printed prescriptions. All instructions explained to patient and her verbalized understanding. Patient was educated regarding tube feeding. Instructed to keep HOB elevated 30 degrees while tube feeding is infusing. Also educated to flush tube with 30 mls water before and after medications administration. Unopened bottle of Osmolite 1.5 Cal sent home with patient, along with unopened tubing and irrigation kit. Dressing changed to PEG tube site prior to D/C and area cleaned well. PEG tube flushed with water and clamped. Per CM, HH is to meet patient at his home tonight to thoroughly teach him regarding tube feeding, and to set up his pump. HH RN will also be at his house tomorrow AM. Discharge paperwork sent with patient included 2 numbers for [facility]- numbers were highlighted and pointed out to patient. Patient was assisted by W/C and requested to wait outside to "call his ride." Patient left A&O, VS stable.  

## 2015-06-29 NOTE — Progress Notes (Signed)
Soft waist belt restraint discontinued on 06/27/15 at 1430 d/t patient resting calmly and quietly in bed, no loner interfering with patient care or attempting to remove tubes and lines. Patient educated on need for medical equipment and agreed not to interfere with care. Confusion decreased. Bed alarm remains on and staff observes patient closely very often. Non-restraint mittens & abdominal binder remain in place with no complications.

## 2015-06-29 NOTE — Progress Notes (Signed)
Pt selected [facility] for [facility] needs.  Referral called to in house rep.

## 2015-06-29 NOTE — Evaluation (Signed)
Physical Therapy Evaluation Patient Details Name: Patrick [name] MRN: [id number] DOB: 10/09/1950 Today's Date: 06/29/2015   History of Present Illness  64 y.o. 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  Pt admitted for EGD with PEG placement 06/26/15 and had post op confusion.  Clinical Impression  Pt admitted with above diagnosis. Pt currently with functional limitations due to the deficits listed below (see PT Problem List).  Pt will benefit from skilled PT to increase their independence and safety with mobility to allow discharge to the venue listed below.   Pt assisted with ambulating in hallway with RW (typically uses SPC).  Pt feels he will function well at home upon d/c (likely later today) and states he can have a friend check on him.  Pt agreeable to use RW for safety as well as HHPT upon d/c.      Follow Up Recommendations Home health PT    Equipment Recommendations  None recommended by PT    Recommendations for Other Services       Precautions / Restrictions Precautions Precautions: Fall Precaution Comments: PEG, abdominal binder Restrictions Weight Bearing Restrictions: No      Mobility  Bed Mobility Overal bed mobility: Needs Assistance Bed Mobility: Supine to Sit;Sit to Supine     Supine to sit: Supervision;HOB elevated Sit to supine: Supervision;HOB elevated   General bed mobility comments: increased time however no assist required  Transfers Overall transfer level: Needs assistance Equipment used: Rolling walker (2 wheeled) Transfers: Sit to/from Stand Sit to Stand: Min guard         General transfer comment: verbal cues for safe technique  Ambulation/Gait Ambulation/Gait assistance: Min guard Ambulation Distance (Feet): 160 Feet Assistive device: Rolling walker (2 wheeled) Gait  Pattern/deviations: Step-through pattern;Decreased stride length     General Gait Details: slow pace, steadiness improved with distance, pt agreeable to use RW upon d/c for safety  Stairs            Wheelchair Mobility    Modified Rankin (Stroke Patients Only)       Balance Overall balance assessment: Needs assistance (denies any recent fall hx)         Standing balance support: Bilateral upper extremity supported Standing balance-Leahy Scale: Poor Standing balance comment: requiring RW at this time                             Pertinent Vitals/Pain Pain Assessment: No/denies pain    Home Living Family/patient expects to be discharged to:: Private residence Living Arrangements: Alone Available Help at Discharge: Friend(s);Available PRN/intermittently Type of Home: Apartment Home Access: Stairs to enter Entrance Stairs-Rails: Right Entrance Stairs-Number of Steps: 6 Home Layout: One level Home Equipment: Cane - single point;Shower seat;Walker - 2 wheels      Prior Function Level of Independence: Independent with assistive device(s)               Hand Dominance        Extremity/Trunk Assessment               Lower Extremity Assessment: Generalized weakness      Cervical / Trunk Assessment: Normal  Communication   Communication: No difficulties  Cognition Arousal/Alertness: Awake/alert Behavior During Therapy: WFL for tasks assessed/performed Overall Cognitive Status: Within Functional Limits for tasks assessed (appropriate although a few instances of word  finding difficulty)                      General Comments      Exercises        Assessment/Plan    PT Assessment Patient needs continued PT services  PT Diagnosis Difficulty walking   PT Problem List Decreased strength;Decreased activity tolerance;Decreased mobility;Decreased balance;Decreased knowledge of use of DME  PT Treatment Interventions DME  instruction;Gait training;Stair training;Functional mobility training;Patient/family education;Therapeutic activities;Therapeutic exercise   PT Goals (Current goals can be found in the Care Plan section) Acute Rehab PT Goals PT Goal Formulation: With patient Time For Goal Achievement: 07/06/15 Potential to Achieve Goals: Good    Frequency Min 3X/week   Barriers to discharge        Co-evaluation               End of Session Equipment Utilized During Treatment:  (abdominal binder) Activity Tolerance: Patient tolerated treatment well Patient left: in bed;with call bell/phone within reach;with bed alarm set           Time: [id number] PT Time Calculation (min) (ACUTE ONLY): 21 min   Charges:   PT Evaluation $Initial PT Evaluation Tier I: 1 Procedure     PT G Codes:        Lexie Morini,KATHrine E 06/29/2015, 1:18 PM Carmelia Bake, PT, DPT 06/29/2015 Pager: [phone]

## 2015-07-01 ENCOUNTER — Other Ambulatory Visit: Payer: Self-pay | Admitting: Oncology

## 2015-07-02 ENCOUNTER — Other Ambulatory Visit (HOSPITAL_BASED_OUTPATIENT_CLINIC_OR_DEPARTMENT_OTHER): Payer: Medicare HMO

## 2015-07-02 ENCOUNTER — Telehealth: Payer: Self-pay | Admitting: Oncology

## 2015-07-02 ENCOUNTER — Ambulatory Visit: Payer: Medicare HMO | Admitting: Nutrition

## 2015-07-02 ENCOUNTER — Ambulatory Visit (HOSPITAL_BASED_OUTPATIENT_CLINIC_OR_DEPARTMENT_OTHER): Payer: Medicare HMO | Admitting: Oncology

## 2015-07-02 ENCOUNTER — Ambulatory Visit: Payer: Medicare HMO

## 2015-07-02 ENCOUNTER — Ambulatory Visit (HOSPITAL_BASED_OUTPATIENT_CLINIC_OR_DEPARTMENT_OTHER): Payer: Medicare HMO

## 2015-07-02 ENCOUNTER — Ambulatory Visit
Admission: RE | Admit: 2015-07-02 | Discharge: 2015-07-02 | Disposition: A | Discharge status: Home / Self Care | Payer: Medicare HMO | Source: Ambulatory Visit | Attending: Radiation Oncology | Admitting: Radiation Oncology

## 2015-07-02 VITALS — BP 114/79 | HR 84 | Temp 97.7°F | Resp 18 | Ht 69.0 in | Wt 150.8 lb

## 2015-07-02 DIAGNOSIS — D61810 Antineoplastic chemotherapy induced pancytopenia: Secondary | ICD-10-CM | POA: Diagnosis not present

## 2015-07-02 DIAGNOSIS — Z5111 Encounter for antineoplastic chemotherapy: Secondary | ICD-10-CM | POA: Diagnosis not present

## 2015-07-02 DIAGNOSIS — C154 Malignant neoplasm of middle third of esophagus: Secondary | ICD-10-CM

## 2015-07-02 DIAGNOSIS — Z510 Encounter for antineoplastic radiation therapy: Secondary | ICD-10-CM | POA: Diagnosis present

## 2015-07-02 DIAGNOSIS — Z7982 Long term (current) use of aspirin: Secondary | ICD-10-CM | POA: Diagnosis not present

## 2015-07-02 LAB — CBC WITH DIFFERENTIAL/PLATELET
BASO%: 0.7 % (ref 0.0–2.0)
Basophils Absolute: 0 10*3/uL (ref 0.0–0.1)
EOS%: 0.5 % (ref 0.0–7.0)
Eosinophils Absolute: 0 10*3/uL (ref 0.0–0.5)
HCT: 29.5 % — ABNORMAL LOW (ref 38.4–49.9)
HGB: 9.6 g/dL — ABNORMAL LOW (ref 13.0–17.1)
LYMPH%: 20.4 % (ref 14.0–49.0)
MCH: 35.2 pg — ABNORMAL HIGH (ref 27.2–33.4)
MCHC: 32.5 g/dL (ref 32.0–36.0)
MCV: 108.3 fL — ABNORMAL HIGH (ref 79.3–98.0)
MONO#: 0.1 10*3/uL (ref 0.1–0.9)
MONO%: 4.7 % (ref 0.0–14.0)
NEUT#: 2.1 10*3/uL (ref 1.5–6.5)
NEUT%: 73.7 % (ref 39.0–75.0)
Platelets: 70 10*3/uL — ABNORMAL LOW (ref 140–400)
RBC: 2.73 10*6/uL — ABNORMAL LOW (ref 4.20–5.82)
RDW: 19 % — ABNORMAL HIGH (ref 11.0–14.6)
WBC: 2.9 10*3/uL — ABNORMAL LOW (ref 4.0–10.3)
lymph#: 0.6 10*3/uL — ABNORMAL LOW (ref 0.9–3.3)

## 2015-07-02 LAB — COMPREHENSIVE METABOLIC PANEL (CC13)
ALBUMIN: 2.8 g/dL — AB (ref 3.5–5.0)
ALT: 92 U/L — AB (ref 0–55)
AST: 101 U/L — AB (ref 5–34)
Alkaline Phosphatase: 89 U/L (ref 40–150)
Anion Gap: 8 mEq/L (ref 3–11)
BILIRUBIN TOTAL: 0.64 mg/dL (ref 0.20–1.20)
BUN: 8.3 mg/dL (ref 7.0–26.0)
CALCIUM: 9.2 mg/dL (ref 8.4–10.4)
CHLORIDE: 108 meq/L (ref 98–109)
CO2: 23 mEq/L (ref 22–29)
CREATININE: 0.9 mg/dL (ref 0.7–1.3)
EGFR: >90 mL/min/{1.73_m2} (ref >90)
Glucose: 120 mg/dl (ref 70–140)
Potassium: 4 mEq/L (ref 3.5–5.1)
Sodium: 139 mEq/L (ref 136–145)
TOTAL PROTEIN: 7.5 g/dL (ref 6.4–8.3)

## 2015-07-02 MED ORDER — DIPHENHYDRAMINE HCL 50 MG/ML IJ SOLN
25.0000 mg | Freq: Once | INTRAMUSCULAR | Status: AC
  Administered 2015-07-02: 25 mg via INTRAVENOUS

## 2015-07-02 MED ORDER — FAMOTIDINE IN NACL 20-0.9 MG/50ML-% IV SOLN
20.0000 mg | Freq: Once | INTRAVENOUS | Status: AC
  Administered 2015-07-02: 20 mg via INTRAVENOUS

## 2015-07-02 MED ORDER — DIPHENHYDRAMINE HCL 50 MG/ML IJ SOLN
INTRAMUSCULAR | Status: AC
  Filled 2015-07-02: qty 1

## 2015-07-02 MED ORDER — FAMOTIDINE IN NACL 20-0.9 MG/50ML-% IV SOLN
INTRAVENOUS | Status: AC
  Filled 2015-07-02: qty 50

## 2015-07-02 MED ORDER — DEXTROSE 5 % IV SOLN
35.0000 mg/m2 | Freq: Once | INTRAVENOUS | Status: AC
  Administered 2015-07-02: 66 mg via INTRAVENOUS
  Filled 2015-07-02: qty 11

## 2015-07-02 MED ORDER — SODIUM CHLORIDE 0.9 % IV SOLN
Freq: Once | INTRAVENOUS | Status: AC
  Administered 2015-07-02: 15:00:00 via INTRAVENOUS
  Filled 2015-07-02: qty 8

## 2015-07-02 MED ORDER — CARBOPLATIN CHEMO INJECTION 450 MG/45ML
180.0000 mg | Freq: Once | INTRAVENOUS | Status: AC
  Administered 2015-07-02: 180 mg via INTRAVENOUS
  Filled 2015-07-02: qty 18

## 2015-07-02 MED ORDER — SODIUM CHLORIDE 0.9 % IV SOLN
Freq: Once | INTRAVENOUS | Status: AC
  Administered 2015-07-02: 14:00:00 via INTRAVENOUS

## 2015-07-02 NOTE — Discharge Summary (Signed)
Physician Discharge Summary  Patrick [name] [id number] DOB: [birth year] DOA: 06/26/2015  PCP: Pcp Not In System  Admit date: 06/26/2015 Discharge date: 06/29/2015  Time spent: 25 minutes  Recommendations for Outpatient Follow-up:  1. Recommend checking cbc to check wbc count and platelets.  2. Please follow up with oncology in one week.    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   Filed Weights   06/26/15 1445 06/28/15 0455 06/29/15 0421  Weight: 66.2 kg (145 lb 15.1 oz) 65.3 kg (143 lb 15.4 oz) 65.1 kg (143 lb 8.3 oz)    History of present illness:  Patrick [name] is a 64 y.o. 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 He is having worsening dysphagia and oncologist is concerned that Patrick [name] will not maintain nutrition and hydration. HE was admitted for PEG tube placement, and underwent the procedure, but post procedure he became delirious and encephalopathic. His CT head negative and ammonia levels are normal. He has a h/o alcohol use .   Hospital Course:   1. ACUTE ENCEPHALOPATHY: , probably delirious from alcohol abuse. CT head ordered which was negative. Normal ammonia levels.  - CIWA protocol initiated and his mental status improved to his baseline.  - encephalopathy resolved.    2. CAD: NO chest pain.  Resume aspirin , lipitor and coreg.    3. Thrombocytopenia; Worsening after the procedure.  No bleeding evident.  Stop lovenox and aspirin and started him on scd's.  Recommend outpatient follow upw ith his oncologist.    4.  Malignant neoplasm of the esophagus: Further recommendations as per Dr Learta Codding.  Radiation oncology follow up with Dr Lisbeth Renshaw.    Esophageal dysphagia: S/p PEG, awaiting nutrition consult.  CXR does not show pneumonia.  Resume tube feeds.   Procedures:  PEG tube placement.   Consultations:  gastroenterology  Discharge Exam: Filed Vitals:   06/29/15 0942 06/29/15 1428  BP: 118/75 116/77  Pulse: 84 83  Temp:  98.4 F (36.9 C)  Resp:  18    General: alert afebrile comfortable Cardiovascular: s1s2 Respiratory: ctab  Discharge Instructions   Discharge Instructions    Diet - low sodium heart healthy    Complete by:  As directed      Discharge instructions    Complete by:  As directed   Follow up with home health PT and GI as recommended.          Discharge Medication List as of 06/29/2015  3:40 PM    START taking these medications   Details  Multiple Vitamin (MULTIVITAMIN WITH MINERALS) TABS tablet Place 1 tablet into feeding tube daily., Starting 06/29/2015, Until Discontinued, Print    nicotine (NICODERM CQ - DOSED IN MG/24 HOURS) 21 mg/24hr patch Place 1 patch (21 mg total) onto the skin daily., Starting 06/29/2015, Until Discontinued, Print      CONTINUE these medications which have CHANGED   Details  aspirin 81 MG tablet Place 1 tablet (81 mg total) into feeding tube daily., Starting 06/29/2015, Until Discontinued, No Print    atorvastatin (LIPITOR) 40 MG tablet Place 1  tablet (40 mg total) into feeding tube daily at 6 PM., Starting 06/29/2015, Until Discontinued, Normal    carvedilol (COREG) 12.5 MG tablet Place 1 tablet (12.5 mg total) into feeding tube 2 (two) times daily with a meal., Starting 06/29/2015, Until Discontinued, No Print    folic acid (FOLVITE) 1 MG tablet Place 1 tablet (1 mg total) into feeding tube daily., Starting 06/29/2015, Until Discontinued, Print    furosemide (LASIX) 20 MG tablet Place 1 tablet (20 mg total) into feeding tube  daily as needed for edema. Take 1 tablet as needed for fluid retention, Starting 06/29/2015, Until Discontinued, Normal    gabapentin (NEURONTIN) 300 MG capsule Place 1 capsule (300 mg total) into feeding tube 3 (three) times daily., Starting 06/29/2015, Until Discontinued, No Print    lisinopril (PRINIVIL,ZESTRIL) 20 MG tablet Place 1 tablet (20 mg total) into feeding tube daily., Starting 06/29/2015, Until Discontinued, No Print    !! Nutritional Supplements (FEEDING SUPPLEMENT, OSMOLITE 1.5 CAL,) LIQD Place 1,000 mLs into feeding tube continuous., Starting 06/29/2015, Until Discontinued, Print    polyethylene glycol (MIRALAX / GLYCOLAX) packet Place 17 g into feeding tube daily as needed for mild constipation., Starting 06/29/2015, Until Discontinued, Normal     !! - Potential duplicate medications found. Please discuss with provider.    CONTINUE these medications which have NOT CHANGED   Details  !! feeding supplement (BOOST / RESOURCE BREEZE) LIQD Take 1 Container by mouth 3 (three) times daily between meals., Starting 04/30/2015, Until Discontinued, Normal    HYDROcodone-acetaminophen (HYCET) 7.5-325 mg/15 ml solution Take 10 mLs by mouth 2 (two) times daily as needed for moderate pain., Starting 06/25/2015, Until Discontinued, Print    nitroGLYCERIN (NITROSTAT) 0.4 MG SL tablet Place 1 tablet (0.4 mg total) under the tongue every 5 (five) minutes as needed for chest pain., Starting 03/03/2015, Until Discontinued, Normal    triamcinolone cream (KENALOG) 0.1 % Apply 1 application topically 2 (two) times daily as needed (apply to rash). , Starting 04/14/2015, Until Discontinued, Historical Med     !! - Potential duplicate medications found. Please discuss with provider.    STOP taking these medications     omeprazole (PRILOSEC) 20 MG capsule      oxyCODONE-acetaminophen (PERCOCET) 7.5-325 MG tablet      prochlorperazine (COMPAZINE) 10 MG tablet      sucralfate (CARAFATE) 1 G tablet       sucralfate (CARAFATE) 1 G tablet      thiamine 100 MG tablet      Wound Dressings (SONAFINE)        No Known Allergies Follow-up Information    Follow up with Silvano Rusk, MD.   Specialty:  Gastroenterology   Why:  As needed   Contact information:   [address] [place] [address] [phone]       Follow up with [facility].   Contact information:   [address] [place] [address] [phone]       Follow up with [facility] On 07/02/2015.   Why:  Please call for PCP appointment on Monday [phone]    Contact information:   [address] [place] [phone]       The results of significant diagnostics from this hospitalization (including imaging, microbiology, ancillary and laboratory) are listed below for reference.    Significant Diagnostic Studies: Dg Chest 2 View  06/26/2015  CLINICAL DATA:  Altered mental status. Diffuse rhonchi on  physical examination. Lethargy. EXAM: CHEST  2 VIEW COMPARISON:  03/02/2015 FINDINGS: Patient is rotated, limiting the examination. Shallow inspiration with atelectasis in the lung bases particularly on the left. No focal consolidation. Normal heart size and pulmonary vascularity. Calcified and tortuous aorta. Mediastinal contours appear intact. Old right rib fractures. Vascular calcifications. IMPRESSION: Shallow inspiration with atelectasis in the lung bases. Electronically Signed   By: Lucienne Capers M.D.   On: 06/26/2015 22:46   Ct Head Wo Contrast  06/27/2015  CLINICAL DATA:  Confusion of acute onset.  Esophageal carcinoma EXAM: CT HEAD WITHOUT CONTRAST TECHNIQUE: Contiguous axial images were obtained from the base of the skull through the vertex without intravenous contrast. COMPARISON:  November 16, 2010 FINDINGS: There is mild diffuse atrophy. There is no intracranial mass, hemorrhage, extra-axial fluid collection, or  midline shift. There is rather minimal small vessel disease in the centra semiovale bilaterally. Elsewhere gray-white compartments are normal. No acute infarct evident. A small amount of basal ganglia calcification may be physiologic in this age group. The bony calvarium appears intact. The mastoid air cells are clear. IMPRESSION: Mild atrophy with minimal periventricular small vessel disease. No acute infarct evident. No hemorrhage or mass effect. Electronically Signed   By: Lowella Grip III M.D.   On: 06/27/2015 17:21   US Abdomen Complete  06/26/2015  CLINICAL DATA:  Abdominal pain. EXAM: ULTRASOUND ABDOMEN COMPLETE COMPARISON:  PET-CT 05/25/2015.  Ultrasound 04/26/2015. FINDINGS: Gallbladder: No gallstones noted. Gallbladder wall thickening is noted with gallbladder wall measuring 3.4 mm. This could be seen with hypoproteinemic states. Acalculous cholecystitis cannot be completely excluded. No pericholecystic fluid collection. Negative Murphy's sign. Common bile duct: Diameter: 2.0 mm Liver: Liver has a coarse echotexture consistent with known cirrhosis. No focal hepatic abnormality. IVC: No abnormality visualized. Pancreas: Visualized portion unremarkable. Spleen: Size and appearance within normal limits. Right Kidney: Length: 6.0 cm. Echogenicity within normal limits. No mass or hydronephrosis visualized right kidney not well visualized. Left Kidney: Length: 13.0 cm. Echogenicity within normal limits. No mass or hydronephrosis visualized. Abdominal aorta: No aneurysm visualized. Other findings: None. IMPRESSION: 1. Gallbladder wall thickening is noted. This can be seen with hypoproteinemic states. Acalculous cholecystitis cannot be completely excluded. No gallstones noted. No pericholecystic fluid. Negative Murphy sign. No biliary distention. 2. Liver has a coarse echotexture consistent with known cirrhosis. No focal hepatic abnormality identified. 3. Right renal atrophy again noted . Electronically  Signed   By: Marcello Moores  Register   On: 06/26/2015 09:32    Microbiology: No results found for this or any previous visit (from the past 240 hour(s)).   Labs: Basic Metabolic Panel:  Recent Labs Lab 06/25/15 1014 06/26/15 1510 06/27/15 1137 06/28/15 0523  NA 139  --  148* 145  K 4.1  --  4.3 3.9  CL  --   --  116* 116*  CO2 20*  --  23 23  GLUCOSE 148*  --  118* 130*  BUN 12.8  --  9 7  CREATININE 1.2 0.88 0.80 0.71  CALCIUM 9.4  --  9.5 9.0   Liver Function Tests:  Recent Labs Lab 06/25/15 1014 06/27/15 1137  AST 75* 104*  ALT 72* 69*  ALKPHOS 103 84  BILITOT 0.65 0.7  PROT 7.8 7.4  ALBUMIN 3.0* 2.9*   No results for input(s): LIPASE, AMYLASE in the last 168 hours.  Recent Labs Lab 06/26/15 2245 06/27/15 1137  AMMONIA <9* 11   CBC:  Recent Labs Lab 06/25/15 1014 06/26/15 1510 06/27/15 1137  06/28/15 0523 06/29/15 1018  WBC 2.4* 2.1* 2.7* 1.5* 1.7*  NEUTROABS 1.1*  --   --   --   --   HGB 10.3* 9.0* 10.0* 8.4* 8.4*  HCT 31.2* 27.2* 29.8* 25.0* 25.3*  MCV 108.9* 105.8* 106.0* 104.2* 104.5*  PLT 103* 81* 77* 59* 58*   Cardiac Enzymes:  Recent Labs Lab 06/26/15 2245 06/27/15 0309 06/27/15 1003  TROPONINI <0.03 <0.03 <0.03   BNP: BNP (last 3 results) No results for input(s): BNP in the last 8760 hours.  ProBNP (last 3 results) No results for input(s): PROBNP in the last 8760 hours.  CBG:  Recent Labs Lab 06/28/15 2046 06/29/15 0007 06/29/15 0410 06/29/15 0727 06/29/15 1148  GLUCAP 130* 136* 120* 107* 121*       Signed:  Kamaiyah Uselton  [facility] 07/02/2015, 9:26 AM

## 2015-07-02 NOTE — Progress Notes (Signed)
Per Dr Bernette Redbird note from today pt is to proceed with chemotherapy today despite persistent pancytopenia and cirrhosis.

## 2015-07-02 NOTE — Progress Notes (Signed)
Nutrition follow-up completed with patient in the infusion room.  Patient has cancer of the esophagus is receiving concurrent chemoradiation therapy. Patient recently had PEG placed on November 22.  Was started on Osmolite 1.5 in the hospital. Weight documented as 150.8 pounds November 28. Patient expresses a desire to remain on a continuous feeding while he is at home. He is able to eat small amounts of food and drink some ensure or boost. Patient is receiving home health [facility] through [facility] and RN is scheduled to meet with patient tonight when he gets home from chemotherapy.  Estimated nutrition needs: 2050-2350 calories, 80-100 grams protein, 2.2 L fluid.  Nutrition diagnosis:  Unintended weight loss.  Slightly improved.  Intervention:  Encouraged patient to continue soft foods and oral nutrition supplements as tolerated throughout the day. Educated patient on continuous tube feeding. Explained option of bolus feeds however he would like to remain on a continuous feedings for now. 5 cans of Osmolite 1.5 provides 1775 cal, 74.5 g protein, 905 mL free water. I will evaluate tube feeding adequacy and increase as needed.  Monitoring, evaluation, goals: Patient will tolerate oral intake plus tube feeding to minimize any further weight loss.  Next visit: Tuesday, November 29 after radiation therapy.  **Disclaimer: This note was dictated with voice recognition software. Similar sounding words can inadvertently be transcribed and this note may contain transcription errors which may not have been corrected upon publication of note.**

## 2015-07-02 NOTE — Patient Instructions (Signed)
[facility] Discharge Instructions for Patients Receiving Chemotherapy  Today you received the following chemotherapy agents TAXOL,CARBOPLATIN To help prevent nausea and vomiting after your treatment, we encourage you to take your nausea medication as prescribed.}   If you develop nausea and vomiting that is not controlled by your nausea medication, call the clinic.   BELOW ARE SYMPTOMS THAT SHOULD BE REPORTED IMMEDIATELY:  *FEVER GREATER THAN 100.5 F  *CHILLS WITH OR WITHOUT FEVER  NAUSEA AND VOMITING THAT IS NOT CONTROLLED WITH YOUR NAUSEA MEDICATION  *UNUSUAL SHORTNESS OF BREATH  *UNUSUAL BRUISING OR BLEEDING  TENDERNESS IN MOUTH AND THROAT WITH OR WITHOUT PRESENCE OF ULCERS  *URINARY PROBLEMS  *BOWEL PROBLEMS  UNUSUAL RASH Items with * indicate a potential emergency and should be followed up as soon as possible.  Feel free to call the clinic you have any questions or concerns. The clinic phone number is [phone].  Please show the CHEMO ALERT CARD at check-in to the Emergency Department and triage nurse.   

## 2015-07-02 NOTE — Progress Notes (Signed)
  [facility] OFFICE PROGRESS NOTE   Diagnosis: Esophagus cancer  INTERVAL HISTORY:   Patrick [name] returns as scheduled. He continues to have dysphagia. He reports tolerating some liquids. He underwent an upper endoscopy and placement of a gastrostomy tube by Dr. Carlean Purl on 06/26/2015. Tumor causing a stricture was noted in the high esophagus. He reports soreness at the gastrostomy tube site. He has not started tube feedings. He has numbness in the fingers. This predated chemotherapy.   Objective:  Vital signs in last 24 hours:  Blood pressure 114/79, pulse 84, temperature 97.7 F (36.5 C), temperature source Oral, resp. rate 18, height 5\' 9"  (1.753 m), weight 150 lb 12.8 oz (68.402 kg), SpO2 100 %.    HEENT: No thrush or ulcers Resp: Lungs clear bilaterally Cardio: Regular rate and rhythm GI: Diffuse tenderness at the upper abdomen, G-tube site without evidence of infection, no hepatomegaly Vascular: Trace edema at the low leg bilaterally, the right lower leg appears slightly larger than left side. No erythema.   Lab Results:  Lab Results  Component Value Date   WBC 2.9* 07/02/2015   HGB 9.6* 07/02/2015   HCT 29.5* 07/02/2015   MCV 108.3* 07/02/2015   PLT 70* 07/02/2015   NEUTROABS 2.1 07/02/2015     Medications: I have reviewed the patient's current medications.  Assessment/Plan: 1. 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 No evidence of hypermetabolic nodal or distant metastasis.  Initiation of radiation 06/11/2015; initiation of weekly Taxol/carboplatin 06/11/2015 2. Cirrhosis 3. Solid dysphagia  secondary to #1 4. Hepatitis C 5. Polysubstance abuse 6. History of coronary artery disease 7. Thrombocytopenia-likely secondary to cirrhosis, hepatitis C, and alcohol use 8. Esophageal varices 9. Barrett's esophagus 10. Report of "Numbness" and leg pain-etiology unclear, neurology evaluation pending 11. Neutropenia secondary to chemotherapy, Taxol and carboplatin dose reduced with week #2 chemotherapy 06/25/2015 12. Gastrostomy feeding tube placement 06/26/2015   Disposition:  Patrick [name] appears stable. The plan is to continue radiation and Taxol/carboplatin chemotherapy. He has persistent pancytopenia secondary to cirrhosis and treatment.  Patrick [name] will contact us for bleeding or a fever. He will meet with the [place] nutritionist today to begin tube feedings. He will return for an office visit and chemotherapy on 07/09/2015.  Betsy Coder, MD  07/02/2015  1:04 PM

## 2015-07-02 NOTE — Telephone Encounter (Signed)
per pof to sch pt appt-gave pt copy of sch-*pt upset avout being sch on 12/13-per pof to sch with kristen-pt stated he has another appt that day--pt will call back to let us now when he can sch-per pt req

## 2015-07-03 ENCOUNTER — Ambulatory Visit: Payer: Medicare HMO

## 2015-07-03 ENCOUNTER — Encounter: Payer: Medicare HMO | Admitting: Nutrition

## 2015-07-03 ENCOUNTER — Telehealth: Payer: Self-pay | Admitting: Oncology

## 2015-07-03 ENCOUNTER — Ambulatory Visit
Admission: RE | Admit: 2015-07-03 | Discharge: 2015-07-03 | Disposition: A | Discharge status: Home / Self Care | Payer: Medicare HMO | Source: Ambulatory Visit | Attending: Radiation Oncology | Admitting: Radiation Oncology

## 2015-07-03 NOTE — Telephone Encounter (Signed)
Left a message for Patrick [name] regarding his missed radiation treatment today.  Requested a return call.

## 2015-07-04 ENCOUNTER — Telehealth: Payer: Self-pay | Admitting: *Deleted

## 2015-07-04 ENCOUNTER — Ambulatory Visit
Admission: RE | Admit: 2015-07-04 | Discharge: 2015-07-04 | Disposition: A | Discharge status: Home / Self Care | Payer: Medicare HMO | Source: Ambulatory Visit | Attending: Radiation Oncology | Admitting: Radiation Oncology

## 2015-07-04 DIAGNOSIS — Z510 Encounter for antineoplastic radiation therapy: Secondary | ICD-10-CM | POA: Diagnosis not present

## 2015-07-04 NOTE — Telephone Encounter (Signed)
Received message 07/03/15 from [employer], Physical Therapist reporting that pt has declined PT at this time; during assessment Tye Maryland recommended that speech language pathologist assessment may be a benefit for this pt and would Dr. Benay Spice be okay with this.  Discussed with Dr. Benay Spice and left voice message for Tye Maryland that MD would not recommend speech language pathologist assessment at this time; any questions to call office.

## 2015-07-05 ENCOUNTER — Ambulatory Visit: Admission: RE | Admit: 2015-07-05 | Payer: Medicare HMO | Source: Ambulatory Visit

## 2015-07-05 ENCOUNTER — Ambulatory Visit
Admission: RE | Admit: 2015-07-05 | Discharge: 2015-07-05 | Disposition: A | Discharge status: Home / Self Care | Payer: Medicare HMO | Source: Ambulatory Visit | Attending: Radiation Oncology | Admitting: Radiation Oncology

## 2015-07-05 ENCOUNTER — Ambulatory Visit: Payer: Medicare HMO | Admitting: Radiation Oncology

## 2015-07-05 ENCOUNTER — Encounter: Payer: Self-pay | Admitting: Radiation Oncology

## 2015-07-05 ENCOUNTER — Ambulatory Visit: Payer: Medicare HMO | Attending: Radiation Oncology | Admitting: Radiation Oncology

## 2015-07-05 VITALS — BP 99/75 | HR 97 | Temp 98.1°F

## 2015-07-05 DIAGNOSIS — C154 Malignant neoplasm of middle third of esophagus: Secondary | ICD-10-CM

## 2015-07-05 NOTE — Progress Notes (Signed)
Department of Radiation Oncology  Phone:  [phone] Fax:        [phone]  Weekly Treatment Note    Name: Patrick [name] Date: 07/05/2015 MRN: [id number]  DOB: [birth year]   Current dose: 25.2 Gy  Current fraction: 14   MEDICATIONS: Current Outpatient Prescriptions  Medication Sig Dispense Refill  . aspirin 81 MG tablet Place 1 tablet (81 mg total) into feeding tube daily. 30 tablet   . atorvastatin (LIPITOR) 40 MG tablet Place 1 tablet (40 mg total) into feeding tube daily at 6 PM. 30 tablet 3  . carvedilol (COREG) 12.5 MG tablet Place 1 tablet (12.5 mg total) into feeding tube 2 (two) times daily with a meal.    . feeding supplement (BOOST / RESOURCE BREEZE) LIQD Take 1 Container by mouth 3 (three) times daily between meals. 90 Container 0  . folic acid (FOLVITE) 1 MG tablet Place 1 tablet (1 mg total) into feeding tube daily. 30 tablet 0  . furosemide (LASIX) 20 MG tablet Place 1 tablet (20 mg total) into feeding tube daily as needed for edema. Take 1 tablet as needed for fluid retention 30 tablet 3  . gabapentin (NEURONTIN) 300 MG capsule Place 1 capsule (300 mg total) into feeding tube 3 (three) times daily.    Marland Kitchen HYDROcodone-acetaminophen (HYCET) 7.5-325 mg/15 ml solution Take 10 mLs by mouth 2 (two) times daily as needed for moderate pain. 600 mL 0  . lisinopril (PRINIVIL,ZESTRIL) 20 MG tablet Place 1 tablet (20 mg total) into feeding tube daily. 30 tablet 0  . Multiple Vitamin (MULTIVITAMIN WITH MINERALS) TABS tablet Place 1 tablet into feeding tube daily. 30 tablet 0  . nicotine (NICODERM CQ - DOSED IN MG/24 HOURS) 21 mg/24hr patch Place 1 patch (21 mg total) onto the skin daily. 28 patch 0  . nitroGLYCERIN (NITROSTAT) 0.4 MG SL tablet Place 1 tablet (0.4 mg total) under the tongue every 5 (five) minutes as needed for chest pain. (Patient not taking: Reported on 06/15/2015) 25 tablet 2  . Nutritional Supplements (FEEDING SUPPLEMENT, OSMOLITE 1.5 CAL,) LIQD Place 1,000  mLs into feeding tube continuous. 1000 mL 100  . polyethylene glycol (MIRALAX / GLYCOLAX) packet Place 17 g into feeding tube daily as needed for mild constipation. 14 each 0  . triamcinolone cream (KENALOG) 0.1 % Apply 1 application topically 2 (two) times daily as needed (apply to rash).      No current facility-administered medications for this encounter.     ALLERGIES: Review of patient's allergies indicates no known allergies.   LABORATORY DATA:  Lab Results  Component Value Date   WBC 2.9* 07/02/2015   HGB 9.6* 07/02/2015   HCT 29.5* 07/02/2015   MCV 108.3* 07/02/2015   PLT 70* 07/02/2015   Lab Results  Component Value Date   NA 139 07/02/2015   K 4.0 07/02/2015   CL 116* 06/28/2015   CO2 23 07/02/2015   Lab Results  Component Value Date   ALT 92* 07/02/2015   AST 101* 07/02/2015   ALKPHOS 89 07/02/2015   BILITOT 0.64 07/02/2015     NARRATIVE: Patrick [name] was seen today for weekly treatment management. The chart was checked and the patient's films were reviewed. Patrick [name] here today with reports of increased fatigue to the point that he does not want to receive treatment today. Orthostatic pressures noted. Just started feeding enteral nutrition yesterday.  Weekly rad txs esophagus 14/25 completed.  12:02 PM BP 99/75 mmHg  Pulse 97  Temp(Src) 98.1  F (36.7 C)  SpO2 100%  Wt Readings from Last 3 Encounters:  07/02/15 150 lb 12.8 oz (68.402 kg)  06/29/15 143 lb 8.3 oz (65.1 kg)  06/26/15 151 lb 11.2 oz (68.811 kg)    PHYSICAL EXAMINATION: temperature is 98.1 F (36.7 C). His blood pressure is 99/75 and his pulse is 97. His oxygen saturation is 100%.        Good skin turgor.  ASSESSMENT: The patient is doing satisfactorily with treatment, but, feeling poorly today.  PLAN: We will continue with the patient's radiation treatment as planned. I recommend he skip radiation today and resume tomorrow, 12/2.    This document serves as a record of services  personally performed by [name], MD. It was created on his behalf by Lendon Collar, a trained medical scribe. The creation of this record is based on the scribe's personal observations and the provider's statements to them. This document has been checked and approved by the attending provider.

## 2015-07-05 NOTE — Progress Notes (Signed)
Patrick [name] here today with reports of increased fatigue to the point that he does not want to receive treatment today.  Orthostatic pressures noted.  Just started feeding enteral nutrition yesterday.   Patient Vitals for the past 24 hrs:  BP Temp Pulse SpO2  07/05/15 1115 99/75 mmHg - 97 -  07/05/15 1112 110/77 mmHg 98.1 F (36.7 C) 93 100 %

## 2015-07-06 ENCOUNTER — Ambulatory Visit: Payer: Medicare HMO | Admitting: Radiation Oncology

## 2015-07-06 ENCOUNTER — Ambulatory Visit: Payer: Medicare HMO

## 2015-07-06 ENCOUNTER — Telehealth: Payer: Self-pay | Admitting: Oncology

## 2015-07-06 ENCOUNTER — Ambulatory Visit: Admission: RE | Admit: 2015-07-06 | Payer: Medicare HMO | Source: Ambulatory Visit | Admitting: Radiation Oncology

## 2015-07-06 ENCOUNTER — Ambulatory Visit: Admission: RE | Admit: 2015-07-06 | Payer: Medicare HMO | Source: Ambulatory Visit

## 2015-07-06 ENCOUNTER — Telehealth: Payer: Self-pay | Admitting: *Deleted

## 2015-07-06 NOTE — Telephone Encounter (Signed)
Called and left a message for Mr. Gordin regarding his missed radiation treatment today.  Requested a return call.

## 2015-07-06 NOTE — Telephone Encounter (Signed)
Called patient home again after  Checking that he was arrived in [place], went to lobby and waiting room ,patient not here, so called and left vm  To check on patient asked to call back 12:39 PM

## 2015-07-08 ENCOUNTER — Other Ambulatory Visit: Payer: Self-pay | Admitting: Oncology

## 2015-07-09 ENCOUNTER — Ambulatory Visit: Admission: RE | Admit: 2015-07-09 | Payer: Medicare HMO | Source: Ambulatory Visit

## 2015-07-09 ENCOUNTER — Ambulatory Visit
Admission: RE | Admit: 2015-07-09 | Discharge: 2015-07-09 | Disposition: A | Discharge status: Home / Self Care | Payer: Medicare HMO | Source: Ambulatory Visit | Attending: Radiation Oncology | Admitting: Radiation Oncology

## 2015-07-09 ENCOUNTER — Encounter: Payer: Medicare HMO | Admitting: Nutrition

## 2015-07-09 ENCOUNTER — Ambulatory Visit (HOSPITAL_BASED_OUTPATIENT_CLINIC_OR_DEPARTMENT_OTHER): Payer: Medicare HMO | Admitting: Oncology

## 2015-07-09 ENCOUNTER — Ambulatory Visit: Payer: Medicare HMO

## 2015-07-09 ENCOUNTER — Other Ambulatory Visit (HOSPITAL_BASED_OUTPATIENT_CLINIC_OR_DEPARTMENT_OTHER): Payer: Medicare HMO

## 2015-07-09 ENCOUNTER — Other Ambulatory Visit: Payer: Medicare HMO

## 2015-07-09 ENCOUNTER — Encounter: Payer: Self-pay | Admitting: Nutrition

## 2015-07-09 ENCOUNTER — Telehealth: Payer: Self-pay | Admitting: Internal Medicine

## 2015-07-09 ENCOUNTER — Encounter: Payer: Self-pay | Admitting: Oncology

## 2015-07-09 VITALS — BP 128/90 | HR 114 | Temp 97.9°F | Resp 18 | Ht 69.0 in | Wt 149.8 lb

## 2015-07-09 DIAGNOSIS — B1920 Unspecified viral hepatitis C without hepatic coma: Secondary | ICD-10-CM

## 2015-07-09 DIAGNOSIS — C154 Malignant neoplasm of middle third of esophagus: Secondary | ICD-10-CM

## 2015-07-09 DIAGNOSIS — D701 Agranulocytosis secondary to cancer chemotherapy: Secondary | ICD-10-CM

## 2015-07-09 DIAGNOSIS — D6959 Other secondary thrombocytopenia: Secondary | ICD-10-CM

## 2015-07-09 DIAGNOSIS — K7460 Unspecified cirrhosis of liver: Secondary | ICD-10-CM

## 2015-07-09 DIAGNOSIS — R1319 Other dysphagia: Secondary | ICD-10-CM | POA: Diagnosis not present

## 2015-07-09 LAB — COMPREHENSIVE METABOLIC PANEL
ALT: 95 U/L — AB (ref 0–55)
AST: 120 U/L — AB (ref 5–34)
Albumin: 2.8 g/dL — ABNORMAL LOW (ref 3.5–5.0)
Alkaline Phosphatase: 86 U/L (ref 40–150)
Anion Gap: 10 mEq/L (ref 3–11)
BILIRUBIN TOTAL: 0.81 mg/dL (ref 0.20–1.20)
BUN: 6.5 mg/dL — AB (ref 7.0–26.0)
CALCIUM: 9.1 mg/dL (ref 8.4–10.4)
CHLORIDE: 102 meq/L (ref 98–109)
CO2: 23 meq/L (ref 22–29)
CREATININE: 0.8 mg/dL (ref 0.7–1.3)
EGFR: >90 mL/min/{1.73_m2} (ref >90)
Glucose: 137 mg/dl (ref 70–140)
Potassium: 3.5 mEq/L (ref 3.5–5.1)
Sodium: 135 mEq/L — ABNORMAL LOW (ref 136–145)
TOTAL PROTEIN: 7.2 g/dL (ref 6.4–8.3)

## 2015-07-09 LAB — CBC WITH DIFFERENTIAL/PLATELET
BASO%: 1.1 % (ref 0.0–2.0)
Basophils Absolute: 0 10*3/uL (ref 0.0–0.1)
EOS%: 1.1 % (ref 0.0–7.0)
Eosinophils Absolute: 0 10*3/uL (ref 0.0–0.5)
HEMATOCRIT: 26.1 % — AB (ref 38.4–49.9)
HGB: 9.1 g/dL — ABNORMAL LOW (ref 13.0–17.1)
LYMPH#: 0.4 10*3/uL — AB (ref 0.9–3.3)
LYMPH%: 44.3 % (ref 14.0–49.0)
MCH: 35.1 pg — ABNORMAL HIGH (ref 27.2–33.4)
MCHC: 34.9 g/dL (ref 32.0–36.0)
MCV: 100.8 fL — ABNORMAL HIGH (ref 79.3–98.0)
MONO#: 0.2 10*3/uL (ref 0.1–0.9)
MONO%: 17 % — ABNORMAL HIGH (ref 0.0–14.0)
NEUT%: 36.5 % — ABNORMAL LOW (ref 39.0–75.0)
NEUTROS ABS: 0.3 10*3/uL — AB (ref 1.5–6.5)
NRBC: 0 % (ref 0–0)
Platelets: 66 10*3/uL — ABNORMAL LOW (ref 140–400)
RBC: 2.59 10*6/uL — ABNORMAL LOW (ref 4.20–5.82)
RDW: 16.9 % — AB (ref 11.0–14.6)
WBC: 0.9 10*3/uL — AB (ref 4.0–10.3)

## 2015-07-09 NOTE — Telephone Encounter (Signed)
Gave and printed appts ched and avs for pt for DEC  °

## 2015-07-09 NOTE — Progress Notes (Signed)
Patient was scheduled for nutrition follow-up during infusion. Patient's chemotherapy was canceled. I was unable to locate patient prior to him leaving for the day. Scheduled nutrition follow-up for Monday, December 12, during next infusion.  **Disclaimer: This note was dictated with voice recognition software. Similar sounding words can inadvertently be transcribed and this note may contain transcription errors which may not have been corrected upon publication of note.**

## 2015-07-09 NOTE — Progress Notes (Signed)
  [place] OFFICE PROGRESS NOTE   Diagnosis: Esophagus cancer  INTERVAL HISTORY:   Patrick [name] returns as scheduled. He continues to have dysphagia. He reports tolerating some liquids. Patient has gastrostomy tube in place. Indicates that he is using a pump for feeding, but does not know what kind of feeding or the rate of the feeding. He has numbness in the fingers. This predated chemotherapy. Denies chest pain, shortness of breath, abdominal pain, nausea and vomiting.   Objective:  Vital signs in last 24 hours:  Blood pressure 128/90, pulse 114, temperature 97.9 F (36.6 C), temperature source Oral, resp. rate 18, height 5\' 9"  (1.753 m), weight 149 lb 12.8 oz (67.949 kg), SpO2 100 %.    HEENT: No thrush or ulcers Resp: Lungs clear bilaterally Cardio: Regular rate and rhythm GI: Diffuse tenderness at the upper abdomen, G-tube site without evidence of infection, no hepatomegaly Vascular: Trace edema at the low leg bilaterally, the right lower leg appears slightly larger than left side. No erythema.   Lab Results:  Lab Results  Component Value Date   WBC 0.9* 07/09/2015   HGB 9.1* 07/09/2015   HCT 26.1* 07/09/2015   MCV 100.8* 07/09/2015   PLT 66* 07/09/2015   NEUTROABS 0.3* 07/09/2015     Medications: I have reviewed the patient's current medications.  Assessment/Plan: 1. 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 No evidence of hypermetabolic nodal or distant metastasis.  Initiation of radiation 06/11/2015; initiation of weekly Taxol/carboplatin 06/11/2015 2. Cirrhosis 3. Solid dysphagia secondary  to #1 4. Hepatitis C 5. Polysubstance abuse 6. History of coronary artery disease 7. Thrombocytopenia-likely secondary to cirrhosis, hepatitis C, and alcohol use 8. Esophageal varices 9. Barrett's esophagus 10. Report of "Numbness" and leg pain-etiology unclear, neurology evaluation pending 11. Neutropenia secondary to chemotherapy, Taxol and carboplatin dose reduced with week #2 chemotherapy 06/25/2015 12. Gastrostomy feeding tube placement 06/26/2015   Disposition:  Mr. Nanna is neutropenic today. He has stable thrombocytopenia. We will hold his chemotherapy today. We will recheck a CBC later this week to be sure his white count is coming up. If his white count is not improving, he really placed on Cipro. I have explained to the patient that he check his temperature closely if fever greater than 100.5 or higher. He is also call for any signs of bleeding. He will have a visit again next week to recheck counts and hopefully resume his Taxol/carboplatin chemotherapy.   He will meet with the [place] dietitian today for ongoing management of his tube feedings. He will return for an office visit and chemotherapy on 07/16/2015.  Plan reviewed with Dr. Benay Spice.  [name], DNP, AGPCNP-BC, AOCNP  07/09/2015  12:22 PM

## 2015-07-10 ENCOUNTER — Ambulatory Visit: Admission: RE | Admit: 2015-07-10 | Payer: Medicare HMO | Source: Ambulatory Visit

## 2015-07-10 ENCOUNTER — Ambulatory Visit
Admission: RE | Admit: 2015-07-10 | Discharge: 2015-07-10 | Disposition: A | Discharge status: Home / Self Care | Payer: Medicare HMO | Source: Ambulatory Visit | Attending: Radiation Oncology | Admitting: Radiation Oncology

## 2015-07-11 ENCOUNTER — Telehealth: Payer: Self-pay | Admitting: *Deleted

## 2015-07-11 ENCOUNTER — Ambulatory Visit: Admission: RE | Admit: 2015-07-11 | Payer: Medicare HMO | Source: Ambulatory Visit

## 2015-07-11 NOTE — Telephone Encounter (Addendum)
I have called Dr. Ida Rogue patient, Mr. Patrick [name], for the past 2 days without any response.  I called his mother this morning and she stated she would try to reach him, but she is unable to drive to his home to check his status. She has not called back.   Called his home again at 6:04 and he answered stating that he will be in tomorrow at 10:30am for labs, then 11:00am for Radiation Therapy.

## 2015-07-11 NOTE — Telephone Encounter (Signed)
Called and spoke with the patient, hoarseness, he stated he is getting some better will be here tomorrow for 1100am rad tx, informed him he hdad a lab appt at 1030 first to arrive by 1015 am to get lab work done ordered by Med/Onc Dept, he stated ,he would be here then, called and spoke with Nira Conn on [place] #4, gave updated status 3:48 PM

## 2015-07-12 ENCOUNTER — Inpatient Hospital Stay
Admission: RE | Admit: 2015-07-12 | Discharge: 2015-07-12 | Disposition: A | Discharge status: Home / Self Care | Payer: Self-pay | Source: Ambulatory Visit | Attending: Radiation Oncology | Admitting: Radiation Oncology

## 2015-07-12 ENCOUNTER — Telehealth: Payer: Self-pay

## 2015-07-12 ENCOUNTER — Ambulatory Visit: Payer: Medicare HMO | Admitting: Radiation Oncology

## 2015-07-12 ENCOUNTER — Other Ambulatory Visit (HOSPITAL_BASED_OUTPATIENT_CLINIC_OR_DEPARTMENT_OTHER): Payer: Medicare HMO

## 2015-07-12 ENCOUNTER — Ambulatory Visit
Admission: RE | Admit: 2015-07-12 | Discharge: 2015-07-12 | Disposition: A | Discharge status: Home / Self Care | Payer: Medicare HMO | Source: Ambulatory Visit | Attending: Radiation Oncology | Admitting: Radiation Oncology

## 2015-07-12 DIAGNOSIS — D6959 Other secondary thrombocytopenia: Secondary | ICD-10-CM

## 2015-07-12 DIAGNOSIS — C154 Malignant neoplasm of middle third of esophagus: Secondary | ICD-10-CM

## 2015-07-12 DIAGNOSIS — D701 Agranulocytosis secondary to cancer chemotherapy: Secondary | ICD-10-CM | POA: Diagnosis not present

## 2015-07-12 DIAGNOSIS — Z510 Encounter for antineoplastic radiation therapy: Secondary | ICD-10-CM | POA: Diagnosis not present

## 2015-07-12 LAB — CBC WITH DIFFERENTIAL/PLATELET
BASO%: 0 % (ref 0.0–2.0)
BASOS ABS: 0 10*3/uL (ref 0.0–0.1)
EOS%: 0.7 % (ref 0.0–7.0)
Eosinophils Absolute: 0 10*3/uL (ref 0.0–0.5)
HCT: 24.9 % — ABNORMAL LOW (ref 38.4–49.9)
HEMOGLOBIN: 8.3 g/dL — AB (ref 13.0–17.1)
LYMPH#: 0.7 10*3/uL — AB (ref 0.9–3.3)
LYMPH%: 44.3 % (ref 14.0–49.0)
MCH: 34 pg — ABNORMAL HIGH (ref 27.2–33.4)
MCHC: 33.3 g/dL (ref 32.0–36.0)
MCV: 102 fL — AB (ref 79.3–98.0)
MONO#: 0.3 10*3/uL (ref 0.1–0.9)
MONO%: 22.1 % — AB (ref 0.0–14.0)
NEUT%: 32.9 % — ABNORMAL LOW (ref 39.0–75.0)
NEUTROS ABS: 0.5 10*3/uL — AB (ref 1.5–6.5)
NRBC: 0 % (ref 0–0)
Platelets: 63 10*3/uL — ABNORMAL LOW (ref 140–400)
RBC: 2.44 10*6/uL — AB (ref 4.20–5.82)
RDW: 17.6 % — AB (ref 11.0–14.6)
WBC: 1.5 10*3/uL — AB (ref 4.0–10.3)

## 2015-07-12 NOTE — Progress Notes (Signed)

## 2015-07-12 NOTE — Progress Notes (Signed)
Pt here for orthostatic Vital Signs. Pt reports constipation.  Constipation handout given with instruction.

## 2015-07-12 NOTE — Telephone Encounter (Signed)
Patrick [name] from [facility] called stating she has left multiple messages for the pt this week and even called his emergency contact. The pt has not returned her calls. She is supposed to do tube feeding education and monitoring. Patrick [name] last saw pt last Thursday evening. Pt is scheduled for lab and XRT today. S/w cheryl Cheston and gave her Tracy's name and number to give to pt. Malachy Mood has also been leaving messages for the pt through this week. appts today lab at 1030, xrt at 1100.

## 2015-07-13 ENCOUNTER — Encounter: Payer: Self-pay | Admitting: Radiation Oncology

## 2015-07-13 ENCOUNTER — Encounter: Payer: Self-pay | Admitting: Adult Health

## 2015-07-13 ENCOUNTER — Ambulatory Visit
Admission: RE | Admit: 2015-07-13 | Discharge: 2015-07-13 | Disposition: A | Discharge status: Home / Self Care | Payer: Medicare HMO | Source: Ambulatory Visit | Attending: Radiation Oncology | Admitting: Radiation Oncology

## 2015-07-13 DIAGNOSIS — Z510 Encounter for antineoplastic radiation therapy: Secondary | ICD-10-CM | POA: Diagnosis not present

## 2015-07-13 DIAGNOSIS — C154 Malignant neoplasm of middle third of esophagus: Secondary | ICD-10-CM

## 2015-07-13 NOTE — Progress Notes (Signed)
Patrick [name], RT let me know about this patient and his recent concerns while undergoing treatment. I did not formally examine or thoroughly evaluate this patient today.  I did briefly speak with Patrick [name] prior to his radiation treatment today and he told me that he has been struggling with constipation.  However, he stated that he bought some OTC Senokot and he had results.  I asked him if he was taking pain medications and he stated that he takes them "every once in awhile."  I stressed the importance of maintaining his bowel regimen, particularly when he is not able to eat or drink as much, as well as when he is taking pain medications.  He expressed understanding.    I made Malachy Mood, RN aware of the patient's concerns and Jehnna's plan for the patient to be evaluated by nursing and/or MD today.  Malachy Mood saw the patient after his treatment today (please see her note for additional documentation).     Mike Craze, NP [place] [phone]

## 2015-07-13 NOTE — Progress Notes (Addendum)
Patrick [name] examined reassessed today.  He reports that he took 2 Sneokot-S tablets at bedtime and had good results this am with resulting soft stools.  He states h feels "much better".  VSS.  Obtained sitting and standing vitals. Gretchen informed.   Patient Vitals for the past 24 hrs:  BP Temp Pulse Height Weight  07/13/15 1140 115/82 mmHg - 92 - -  07/13/15 1135 114/81 mmHg 97.8 F (36.6 C) 84 5\' 9"  (1.753 m) 149 lb 8 oz (67.813 kg)

## 2015-07-14 ENCOUNTER — Other Ambulatory Visit: Payer: Self-pay | Admitting: Oncology

## 2015-07-16 ENCOUNTER — Ambulatory Visit (HOSPITAL_BASED_OUTPATIENT_CLINIC_OR_DEPARTMENT_OTHER): Payer: Medicare HMO | Admitting: Oncology

## 2015-07-16 ENCOUNTER — Other Ambulatory Visit: Payer: Self-pay | Admitting: Oncology

## 2015-07-16 ENCOUNTER — Ambulatory Visit (HOSPITAL_BASED_OUTPATIENT_CLINIC_OR_DEPARTMENT_OTHER): Payer: Medicare HMO

## 2015-07-16 ENCOUNTER — Other Ambulatory Visit (HOSPITAL_BASED_OUTPATIENT_CLINIC_OR_DEPARTMENT_OTHER): Payer: Medicare HMO

## 2015-07-16 ENCOUNTER — Telehealth: Payer: Self-pay | Admitting: *Deleted

## 2015-07-16 ENCOUNTER — Ambulatory Visit: Payer: Medicare HMO | Admitting: Nutrition

## 2015-07-16 ENCOUNTER — Encounter: Payer: Self-pay | Admitting: Oncology

## 2015-07-16 ENCOUNTER — Ambulatory Visit
Admission: RE | Admit: 2015-07-16 | Discharge: 2015-07-16 | Disposition: A | Discharge status: Home / Self Care | Payer: Medicare HMO | Source: Ambulatory Visit | Attending: Radiation Oncology | Admitting: Radiation Oncology

## 2015-07-16 ENCOUNTER — Encounter: Payer: Self-pay | Admitting: Internal Medicine

## 2015-07-16 VITALS — BP 109/75 | HR 90 | Temp 98.9°F | Resp 18 | Ht 69.0 in | Wt 152.1 lb

## 2015-07-16 DIAGNOSIS — B1920 Unspecified viral hepatitis C without hepatic coma: Secondary | ICD-10-CM | POA: Diagnosis not present

## 2015-07-16 DIAGNOSIS — D701 Agranulocytosis secondary to cancer chemotherapy: Secondary | ICD-10-CM

## 2015-07-16 DIAGNOSIS — C159 Malignant neoplasm of esophagus, unspecified: Secondary | ICD-10-CM

## 2015-07-16 DIAGNOSIS — D6959 Other secondary thrombocytopenia: Secondary | ICD-10-CM

## 2015-07-16 DIAGNOSIS — E876 Hypokalemia: Secondary | ICD-10-CM

## 2015-07-16 DIAGNOSIS — C154 Malignant neoplasm of middle third of esophagus: Secondary | ICD-10-CM

## 2015-07-16 DIAGNOSIS — C153 Malignant neoplasm of upper third of esophagus: Secondary | ICD-10-CM

## 2015-07-16 DIAGNOSIS — K7460 Unspecified cirrhosis of liver: Secondary | ICD-10-CM

## 2015-07-16 DIAGNOSIS — Z510 Encounter for antineoplastic radiation therapy: Secondary | ICD-10-CM | POA: Diagnosis not present

## 2015-07-16 DIAGNOSIS — Z5111 Encounter for antineoplastic chemotherapy: Secondary | ICD-10-CM

## 2015-07-16 LAB — COMPREHENSIVE METABOLIC PANEL
ALT: 105 U/L — AB (ref 0–55)
AST: 138 U/L — AB (ref 5–34)
Albumin: 2.6 g/dL — ABNORMAL LOW (ref 3.5–5.0)
Alkaline Phosphatase: 132 U/L (ref 40–150)
Anion Gap: 9 mEq/L (ref 3–11)
BILIRUBIN TOTAL: 0.5 mg/dL (ref 0.20–1.20)
CALCIUM: 8.7 mg/dL (ref 8.4–10.4)
CHLORIDE: 106 meq/L (ref 98–109)
CO2: 22 meq/L (ref 22–29)
CREATININE: 0.8 mg/dL (ref 0.7–1.3)
EGFR: >90 mL/min/{1.73_m2} (ref >90)
Glucose: 112 mg/dl (ref 70–140)
POTASSIUM: 3.3 meq/L — AB (ref 3.5–5.1)
SODIUM: 136 meq/L (ref 136–145)
Total Protein: 7 g/dL (ref 6.4–8.3)

## 2015-07-16 LAB — CBC WITH DIFFERENTIAL/PLATELET
BASO%: 0.5 % (ref 0.0–2.0)
BASOS ABS: 0 10*3/uL (ref 0.0–0.1)
EOS%: 0.6 % (ref 0.0–7.0)
Eosinophils Absolute: 0 10*3/uL (ref 0.0–0.5)
HEMATOCRIT: 28.1 % — AB (ref 38.4–49.9)
HGB: 9.2 g/dL — ABNORMAL LOW (ref 13.0–17.1)
LYMPH#: 0.6 10*3/uL — AB (ref 0.9–3.3)
LYMPH%: 25.6 % (ref 14.0–49.0)
MCH: 34.9 pg — AB (ref 27.2–33.4)
MCHC: 32.7 g/dL (ref 32.0–36.0)
MCV: 106.9 fL — ABNORMAL HIGH (ref 79.3–98.0)
MONO#: 0.5 10*3/uL (ref 0.1–0.9)
MONO%: 21.9 % — AB (ref 0.0–14.0)
NEUT#: 1.3 10*3/uL — ABNORMAL LOW (ref 1.5–6.5)
NEUT%: 51.4 % (ref 39.0–75.0)
Platelets: 76 10*3/uL — ABNORMAL LOW (ref 140–400)
RBC: 2.63 10*6/uL — AB (ref 4.20–5.82)
RDW: 19.2 % — ABNORMAL HIGH (ref 11.0–14.6)
WBC: 2.5 10*3/uL — ABNORMAL LOW (ref 4.0–10.3)

## 2015-07-16 MED ORDER — DIPHENHYDRAMINE HCL 50 MG/ML IJ SOLN
25.0000 mg | Freq: Once | INTRAMUSCULAR | Status: AC
  Administered 2015-07-16: 25 mg via INTRAVENOUS

## 2015-07-16 MED ORDER — POTASSIUM CHLORIDE 20 MEQ/15ML (10%) PO SOLN: 20.0000 meq | Freq: Every day | ORAL | Status: DC

## 2015-07-16 MED ORDER — FAMOTIDINE IN NACL 20-0.9 MG/50ML-% IV SOLN
INTRAVENOUS | Status: AC
  Filled 2015-07-16: qty 50

## 2015-07-16 MED ORDER — SODIUM CHLORIDE 0.9 % IV SOLN
Freq: Once | INTRAVENOUS | Status: AC
  Administered 2015-07-16: 12:00:00 via INTRAVENOUS
  Filled 2015-07-16: qty 8

## 2015-07-16 MED ORDER — FAMOTIDINE IN NACL 20-0.9 MG/50ML-% IV SOLN
20.0000 mg | Freq: Once | INTRAVENOUS | Status: AC
  Administered 2015-07-16: 20 mg via INTRAVENOUS

## 2015-07-16 MED ORDER — SODIUM CHLORIDE 0.9 % IV SOLN
140.0000 mg | Freq: Once | INTRAVENOUS | Status: AC
  Administered 2015-07-16: 140 mg via INTRAVENOUS
  Filled 2015-07-16: qty 14

## 2015-07-16 MED ORDER — PACLITAXEL CHEMO INJECTION 300 MG/50ML
35.0000 mg/m2 | Freq: Once | INTRAVENOUS | Status: DC
  Filled 2015-07-16: qty 11

## 2015-07-16 MED ORDER — SODIUM CHLORIDE 0.9 % IV SOLN
Freq: Once | INTRAVENOUS | Status: AC
  Administered 2015-07-16: 12:00:00 via INTRAVENOUS

## 2015-07-16 MED ORDER — PACLITAXEL CHEMO INJECTION 300 MG/50ML
25.0000 mg/m2 | Freq: Once | INTRAVENOUS | Status: AC
  Administered 2015-07-16: 48 mg via INTRAVENOUS
  Filled 2015-07-16: qty 8

## 2015-07-16 MED ORDER — SODIUM CHLORIDE 0.9 % IV SOLN
180.0000 mg | Freq: Once | INTRAVENOUS | Status: DC
  Filled 2015-07-16: qty 18

## 2015-07-16 MED ORDER — DIPHENHYDRAMINE HCL 50 MG/ML IJ SOLN
INTRAMUSCULAR | Status: AC
  Filled 2015-07-16: qty 1

## 2015-07-16 MED ORDER — PACLITAXEL CHEMO INJECTION 300 MG/50ML: 25.0000 mg/m2 | Freq: Once | INTRAVENOUS | Status: DC

## 2015-07-16 NOTE — Telephone Encounter (Signed)
patient called and stated he had dried blood on his area where his pump is, cleansed and a little blood dripped down, he is worried, not blood now, has lab and med onc appt first before treatment, suggested he keep those appt and then his radiation treatment appt,  Will see patient if needed after med onc assessment,.patient agreed

## 2015-07-16 NOTE — Telephone Encounter (Signed)
As noted below by Erasmo Downer, NP, I spoke with patient and informed him that his potassium level was 3.3. Instructed patient that he will need to go to his pharmacy, and pick up the prescription for liquid potassium. Potassium should be administered through his PEG tube for seven days. Patient verbalized understanding.

## 2015-07-16 NOTE — Progress Notes (Signed)
  [place] OFFICE PROGRESS NOTE   Diagnosis: Esophagus cancer  INTERVAL HISTORY:   Patrick [name] returns as scheduled. He continues to have dysphagia. He reports tolerating some liquids. Patient has gastrostomy tube in place. Reports that he had some bloody drainage from the PEG tube site this morning. It sounds like it bled quickly and then stop. Denies any purulent drainage from the PEG site. States that has been out to change his dressing. Indicates that he has been avoiding calls for advanced home care. He is putting water through his PEG tube, but not really taking his feedings as prescribed. He has numbness in the fingers. This predated chemotherapy. Denies chest pain, shortness of breath, abdominal pain, nausea and vomiting.   Objective:  Vital signs in last 24 hours:  Blood pressure 109/75, pulse 90, temperature 98.9 F (37.2 C), temperature source Oral, resp. rate 18, height 5\' 9"  (1.753 m), weight 152 lb 1.6 oz (68.992 kg), SpO2 100 %.    HEENT: No thrush or ulcers Resp: Lungs clear bilaterally Cardio: Regular rate and rhythm GI: Diffuse tenderness at the upper abdomen, G-tube site without evidence of infection, no hepatomegaly Vascular: Trace edema at the low leg bilaterally, the right lower leg appears slightly larger than left side. No erythema.   Lab Results:  Lab Results  Component Value Date   WBC 2.5* 07/16/2015   HGB 9.2* 07/16/2015   HCT 28.1* 07/16/2015   MCV 106.9* 07/16/2015   PLT 76 Large platelets present* 07/16/2015   NEUTROABS 1.3* 07/16/2015     Medications: I have reviewed the patient's current medications.  Assessment/Plan: 1. 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 No evidence of hypermetabolic nodal or distant metastasis.  Initiation of radiation 06/11/2015; initiation of weekly Taxol/carboplatin 06/11/2015 2. Cirrhosis 3. Solid dysphagia secondary to #1 4. Hepatitis C 5. Polysubstance abuse 6. History of coronary artery disease 7. Thrombocytopenia-likely secondary to cirrhosis, hepatitis C, and alcohol use 8. Esophageal varices 9. Barrett's esophagus 10. Report of "Numbness" and leg pain-etiology unclear, neurology evaluation pending 11. Neutropenia secondary to chemotherapy, Taxol and carboplatin dose reduced with week #2 chemotherapy 06/25/2015 12. Gastrostomy feeding tube placement 06/26/2015   Disposition:  Mr. Crofts counts have improved. Discussed with Dr. Benay Spice and we will proceed with his chemotherapy as scheduled today. This will be cycle 4. His chemotherapy will be dose reduced due to previous neutropenia and thrombocytopenia. He will have a visit again next week for cycle 5 of his chemotherapy.   We have contacted advanced home care today to please return to the patient regarding dressing changes. We have encouraged patient to answer their calls and to allow them to come into the home to address his PEG tube.  His potassium level is low today and I have sent liquid potassium supplementation to his local pharmacy. This will be taken for 7 days.  He will meet with the [place] dietitian today for ongoing management of his tube feedings. He will return for an office visit and chemotherapy on 07/23/2015.  Plan reviewed with Dr. Benay Spice.  [name], DNP, AGPCNP-BC, AOCNP  07/16/2015  4:01 PM

## 2015-07-16 NOTE — Patient Instructions (Signed)
[facility] Discharge Instructions for Patients Receiving Chemotherapy  Today you received the following chemotherapy agents Taxol/Carboplatin To help prevent nausea and vomiting after your treatment, we encourage you to take your nausea medication as prescribed.   If you develop nausea and vomiting that is not controlled by your nausea medication, call the clinic.   BELOW ARE SYMPTOMS THAT SHOULD BE REPORTED IMMEDIATELY:  *FEVER GREATER THAN 100.5 F  *CHILLS WITH OR WITHOUT FEVER  NAUSEA AND VOMITING THAT IS NOT CONTROLLED WITH YOUR NAUSEA MEDICATION  *UNUSUAL SHORTNESS OF BREATH  *UNUSUAL BRUISING OR BLEEDING  TENDERNESS IN MOUTH AND THROAT WITH OR WITHOUT PRESENCE OF ULCERS  *URINARY PROBLEMS  *BOWEL PROBLEMS  UNUSUAL RASH Items with * indicate a potential emergency and should be followed up as soon as possible.  Feel free to call the clinic you have any questions or concerns. The clinic phone number is [phone].  Please show the CHEMO ALERT CARD at check-in to the Emergency Department and triage nurse.   

## 2015-07-16 NOTE — Telephone Encounter (Signed)
-----   Message from Maryanna Shape, NP sent at 07/16/2015  4:04 PM EST ----- Regarding: call lab [facility]  Please call Patrick [name]. His K+ level is low. He needs to take K+ daily for 7 days. I have sent an prescription for liquid K+ to put through his PEG daily. It will be 15 ml daily. Rx was sent to [facility]  Thanks

## 2015-07-16 NOTE — Progress Notes (Signed)
Per Beverlee Nims, Infusion RN: Per Erasmo Downer NP, okay to tx with today's labs.

## 2015-07-16 NOTE — Progress Notes (Signed)
Nutrition follow-up completed with patient in the infusion room.  Patient is being treated for esophageal cancer. Patient recently had a PEG placed but has not used feeding tube as educated. Patient had expressed desire to remain on a continuous feeding however, he states he has not used tube for nutrition support. Weight improved and documented as 152.1 pounds December 12 increased from 149.5 pounds, December 9. Noted potassium decreased at 3.3, and albumin of 2.6. Patient reports he is eating meals and drinking 2 Ensure Plus daily. Patient verbalizes "I need to wrap my had around using the feeding tube."  Nutrition diagnosis: Unintended weight loss improved.  Intervention: Educated patient to continue strategies for increased calories and protein in meals and snacks. Recommended patient consume 2-3 Ensure Plus daily by mouth. Provided support and encouragement for patient to allow home health to educate further on home tube feeding. Teach back method was used.  Monitoring, evaluation, goals:  Patient will tolerate increased oral intake plus oral nutrition supplements. He will allow home health to educate and initiate nutrition support.  Next visit: To be scheduled as needed.  Patient does not have chemotherapy scheduled after today.  **Disclaimer: This note was dictated with voice recognition software. Similar sounding words can inadvertently be transcribed and this note may contain transcription errors which may not have been corrected upon publication of note.**

## 2015-07-17 ENCOUNTER — Ambulatory Visit
Admission: RE | Admit: 2015-07-17 | Discharge: 2015-07-17 | Disposition: A | Discharge status: Home / Self Care | Payer: Medicare HMO | Source: Ambulatory Visit | Attending: Radiation Oncology | Admitting: Radiation Oncology

## 2015-07-17 ENCOUNTER — Telehealth: Payer: Self-pay | Admitting: Oncology

## 2015-07-17 DIAGNOSIS — Z510 Encounter for antineoplastic radiation therapy: Secondary | ICD-10-CM | POA: Diagnosis not present

## 2015-07-17 NOTE — Telephone Encounter (Signed)
lvm for pt regarding to DEC appt.... °

## 2015-07-18 ENCOUNTER — Encounter: Payer: Self-pay | Admitting: Radiation Oncology

## 2015-07-18 ENCOUNTER — Other Ambulatory Visit: Payer: Self-pay | Admitting: Oncology

## 2015-07-18 ENCOUNTER — Ambulatory Visit
Admission: RE | Admit: 2015-07-18 | Discharge: 2015-07-18 | Disposition: A | Discharge status: Home / Self Care | Payer: Medicare HMO | Source: Ambulatory Visit | Attending: Radiation Oncology | Admitting: Radiation Oncology

## 2015-07-18 VITALS — BP 142/103 | HR 112 | Temp 97.5°F | Resp 20 | Wt 152.8 lb

## 2015-07-18 DIAGNOSIS — Z510 Encounter for antineoplastic radiation therapy: Secondary | ICD-10-CM | POA: Diagnosis not present

## 2015-07-18 DIAGNOSIS — C154 Malignant neoplasm of middle third of esophagus: Secondary | ICD-10-CM

## 2015-07-18 NOTE — Progress Notes (Signed)
Department of Radiation Oncology  Phone:  [phone] Fax:        [phone]  Weekly Treatment Note    Name: Patrick [name] Date: 07/18/2015 MRN: [id number] DOB: [birth year]   Diagnosis:     ICD-9-CM ICD-10-CM   1. 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 aspirin 81 MG tablet Place 1 tablet (81 mg total) into feeding tube daily. 30 tablet   . atorvastatin (LIPITOR) 40 MG tablet Place 1 tablet (40 mg total) into feeding tube daily at 6 PM. 30 tablet 3  . carvedilol (COREG) 12.5 MG tablet Place 1 tablet (12.5 mg total) into feeding tube 2 (two) times daily with a meal.    . feeding supplement (BOOST / RESOURCE BREEZE) LIQD Take 1 Container by mouth 3 (three) times daily between meals. 90 Container 0  . folic acid (FOLVITE) 1 MG tablet Place 1 tablet (1 mg total) into feeding tube daily. 30 tablet 0  . furosemide (LASIX) 20 MG tablet Place 1 tablet (20 mg total) into feeding tube daily as needed for edema. Take 1 tablet as needed for fluid retention 30 tablet 3  . gabapentin (NEURONTIN) 300 MG capsule Place 1 capsule (300 mg total) into feeding tube 3 (three) times daily.    Marland Kitchen HYDROcodone-acetaminophen (HYCET) 7.5-325 mg/15 ml solution Take 10 mLs by mouth 2 (two) times daily as needed for moderate pain. 600 mL 0  . lisinopril (PRINIVIL,ZESTRIL) 20 MG tablet Place 1 tablet (20 mg total) into feeding tube daily. 30 tablet 0  . Multiple Vitamin (MULTIVITAMIN WITH MINERALS) TABS tablet Place 1 tablet into feeding tube daily. 30 tablet 0  . nicotine (NICODERM CQ - DOSED IN MG/24 HOURS) 21 mg/24hr patch Place 1 patch (21 mg total) onto the skin daily. 28 patch 0  . nitroGLYCERIN (NITROSTAT) 0.4 MG SL tablet Place 1 tablet (0.4 mg total) under the tongue every 5 (five) minutes as needed for chest pain. 25 tablet 2  . Nutritional Supplements  (FEEDING SUPPLEMENT, OSMOLITE 1.5 CAL,) LIQD Place 1,000 mLs into feeding tube continuous. 1000 mL 100  . polyethylene glycol (MIRALAX / GLYCOLAX) packet Place 17 g into feeding tube daily as needed for mild constipation. 14 each 0  . potassium chloride 20 MEQ/15ML (10%) SOLN Take 15 mLs (20 mEq total) by mouth daily. Take via PEG tube daily for 7 days. 105 mL 0  . Sennosides-Docusate Sodium (SENOKOT S PO) Take 2 tablets by mouth daily.    Marland Kitchen triamcinolone cream (KENALOG) 0.1 % Apply 1 application topically 2 (two) times daily as needed (apply to rash).      No current facility-administered medications for this encounter.     ALLERGIES: Review of patient's allergies indicates no known allergies.   LABORATORY DATA:  Lab Results  Component Value Date   WBC 2.5* 07/16/2015   HGB 9.2* 07/16/2015   HCT 28.1* 07/16/2015   MCV 106.9* 07/16/2015   PLT 76 Large platelets present* 07/16/2015   Lab Results  Component Value Date   NA 136 07/16/2015   K 3.3* 07/16/2015   CL 116* 06/28/2015   CO2 22 07/16/2015   Lab Results  Component Value Date   ALT 105* 07/16/2015   AST 138* 07/16/2015   ALKPHOS 132 07/16/2015   BILITOT 0.50 07/16/2015     NARRATIVE: Sylvanus Hullum was seen today for weekly  treatment management. The chart was checked and the patient's films were reviewed.  Weekly   Esophagus rad tx, 19/25 completed,    Eating soft foods,  Home health nurse coming to the house today to check peg tube, gets osmolite via peg, flushing water before and after,  gave extra drain sponges to patient, fatigued,  Pain in his legs 8/10,  Neck is dry and slight erythema, uses cream daily,  Patient missed several days, depression and not feeling well stated, feeling better now,  BP 142/103 mmHg  Pulse 112  Temp(Src) 97.5 F (36.4 C)  Resp 20  Wt 152 lb 12.8 oz (69.31 kg)  Wt Readings from Last 3 Encounters:  07/18/15 152 lb 12.8 oz (69.31 kg)  07/16/15 152 lb 1.6 oz (68.992 kg)  07/13/15 149  lb 8 oz (67.813 kg)   11:52 AM   PHYSICAL EXAMINATION: weight is 152 lb 12.8 oz (69.31 kg). His temperature is 97.5 F (36.4 C). His blood pressure is 142/103 and his pulse is 112. His respiration is 20.      mild hyperpigmentation in the treatment area.  ASSESSMENT: The patient is doing satisfactorily with treatment.  PLAN: We will continue with the patient's radiation treatment as planned. The patient is feeling better. He missed treatments due to some worsening depression but he stated that he is committed to finishing treatment at this point.

## 2015-07-18 NOTE — Progress Notes (Addendum)
Weekly   Esophagus rad tx, 19/25 completed,    Eating soft foods,  Home health nurse coming to the house today to check peg tube, gets osmolite via peg, flushing water before and after,  gave extra drain sponges to patient, fatigued,  Pain in his legs 8/10,  Neck is dry and slight erythema, uses cream daily,  Patient missed several days, depression and not feeling well stated, feeling better now,  BP 142/103 mmHg  Pulse 112  Temp(Src) 97.5 F (36.4 C)  Resp 20  Wt 152 lb 12.8 oz (69.31 kg)  Wt Readings from Last 3 Encounters:  07/18/15 152 lb 12.8 oz (69.31 kg)  07/16/15 152 lb 1.6 oz (68.992 kg)  07/13/15 149 lb 8 oz (67.813 kg)   11:36 AM

## 2015-07-19 ENCOUNTER — Telehealth: Payer: Self-pay | Admitting: *Deleted

## 2015-07-19 ENCOUNTER — Ambulatory Visit: Payer: Medicare HMO

## 2015-07-19 ENCOUNTER — Ambulatory Visit
Admission: RE | Admit: 2015-07-19 | Discharge: 2015-07-19 | Disposition: A | Discharge status: Home / Self Care | Payer: Medicare HMO | Source: Ambulatory Visit | Attending: Radiation Oncology | Admitting: Radiation Oncology

## 2015-07-19 DIAGNOSIS — Z510 Encounter for antineoplastic radiation therapy: Secondary | ICD-10-CM | POA: Diagnosis not present

## 2015-07-19 NOTE — Telephone Encounter (Signed)
Message from [place] with [facility] reporting she saw pt last night and he was receptive to teaching. Olivia Mackie is requesting to increase nursing visits for reinforcement of tube feeding instructions.

## 2015-07-20 ENCOUNTER — Ambulatory Visit
Admission: RE | Admit: 2015-07-20 | Discharge: 2015-07-20 | Disposition: A | Discharge status: Home / Self Care | Payer: Medicare HMO | Source: Ambulatory Visit | Attending: Radiation Oncology | Admitting: Radiation Oncology

## 2015-07-20 ENCOUNTER — Ambulatory Visit: Payer: Medicare HMO

## 2015-07-20 DIAGNOSIS — Z510 Encounter for antineoplastic radiation therapy: Secondary | ICD-10-CM | POA: Diagnosis not present

## 2015-07-23 ENCOUNTER — Ambulatory Visit
Admission: RE | Admit: 2015-07-23 | Discharge: 2015-07-23 | Disposition: A | Discharge status: Home / Self Care | Payer: Medicare HMO | Source: Ambulatory Visit | Attending: Radiation Oncology | Admitting: Radiation Oncology

## 2015-07-23 DIAGNOSIS — Z510 Encounter for antineoplastic radiation therapy: Secondary | ICD-10-CM | POA: Diagnosis not present

## 2015-07-24 ENCOUNTER — Ambulatory Visit: Payer: Medicare HMO

## 2015-07-24 ENCOUNTER — Other Ambulatory Visit (HOSPITAL_BASED_OUTPATIENT_CLINIC_OR_DEPARTMENT_OTHER): Payer: Medicare HMO

## 2015-07-24 ENCOUNTER — Ambulatory Visit (HOSPITAL_BASED_OUTPATIENT_CLINIC_OR_DEPARTMENT_OTHER): Payer: Medicare HMO

## 2015-07-24 ENCOUNTER — Ambulatory Visit (HOSPITAL_BASED_OUTPATIENT_CLINIC_OR_DEPARTMENT_OTHER): Payer: Medicare HMO | Admitting: Oncology

## 2015-07-24 ENCOUNTER — Encounter: Payer: Self-pay | Admitting: Oncology

## 2015-07-24 ENCOUNTER — Ambulatory Visit
Admission: RE | Admit: 2015-07-24 | Discharge: 2015-07-24 | Disposition: A | Discharge status: Home / Self Care | Payer: Medicare HMO | Source: Ambulatory Visit | Attending: Radiation Oncology | Admitting: Radiation Oncology

## 2015-07-24 VITALS — BP 112/74 | HR 91 | Temp 98.0°F | Resp 18

## 2015-07-24 VITALS — BP 121/82 | HR 105 | Temp 97.9°F | Resp 18 | Ht 69.0 in | Wt 143.9 lb

## 2015-07-24 DIAGNOSIS — C159 Malignant neoplasm of esophagus, unspecified: Secondary | ICD-10-CM

## 2015-07-24 DIAGNOSIS — C153 Malignant neoplasm of upper third of esophagus: Secondary | ICD-10-CM

## 2015-07-24 DIAGNOSIS — Z5111 Encounter for antineoplastic chemotherapy: Secondary | ICD-10-CM

## 2015-07-24 DIAGNOSIS — Z510 Encounter for antineoplastic radiation therapy: Secondary | ICD-10-CM | POA: Diagnosis not present

## 2015-07-24 DIAGNOSIS — C154 Malignant neoplasm of middle third of esophagus: Secondary | ICD-10-CM

## 2015-07-24 LAB — CBC WITH DIFFERENTIAL/PLATELET
BASO%: 0.4 % (ref 0.0–2.0)
BASOS ABS: 0 10*3/uL (ref 0.0–0.1)
EOS%: 0.8 % (ref 0.0–7.0)
Eosinophils Absolute: 0 10*3/uL (ref 0.0–0.5)
HEMATOCRIT: 24.4 % — AB (ref 38.4–49.9)
HEMOGLOBIN: 8.3 g/dL — AB (ref 13.0–17.1)
LYMPH#: 0.6 10*3/uL — AB (ref 0.9–3.3)
LYMPH%: 23.2 % (ref 14.0–49.0)
MCH: 34.6 pg — ABNORMAL HIGH (ref 27.2–33.4)
MCHC: 34 g/dL (ref 32.0–36.0)
MCV: 101.7 fL — AB (ref 79.3–98.0)
MONO#: 0.4 10*3/uL (ref 0.1–0.9)
MONO%: 14.7 % — ABNORMAL HIGH (ref 0.0–14.0)
NEUT#: 1.6 10*3/uL (ref 1.5–6.5)
NEUT%: 60.9 % (ref 39.0–75.0)
NRBC: 1 % — AB (ref 0–0)
PLATELETS: 56 10*3/uL — AB (ref 140–400)
RBC: 2.4 10*6/uL — AB (ref 4.20–5.82)
RDW: 17.2 % — AB (ref 11.0–14.6)
WBC: 2.6 10*3/uL — ABNORMAL LOW (ref 4.0–10.3)

## 2015-07-24 LAB — COMPREHENSIVE METABOLIC PANEL
ALBUMIN: 2.8 g/dL — AB (ref 3.5–5.0)
ALK PHOS: 165 U/L — AB (ref 40–150)
ALT: 105 U/L — AB (ref 0–55)
ANION GAP: 8 meq/L (ref 3–11)
AST: 143 U/L — ABNORMAL HIGH (ref 5–34)
BILIRUBIN TOTAL: 0.49 mg/dL (ref 0.20–1.20)
BUN: 6.6 mg/dL — ABNORMAL LOW (ref 7.0–26.0)
CALCIUM: 9.1 mg/dL (ref 8.4–10.4)
CO2: 24 meq/L (ref 22–29)
CREATININE: 0.8 mg/dL (ref 0.7–1.3)
Chloride: 103 mEq/L (ref 98–109)
Glucose: 121 mg/dl (ref 70–140)
Potassium: 4.2 mEq/L (ref 3.5–5.1)
Sodium: 136 mEq/L (ref 136–145)
TOTAL PROTEIN: 7.2 g/dL (ref 6.4–8.3)

## 2015-07-24 MED ORDER — METHYLPREDNISOLONE SODIUM SUCC 125 MG IJ SOLR
125.0000 mg | Freq: Once | INTRAMUSCULAR | Status: AC | PRN
  Administered 2015-07-24: 125 mg via INTRAVENOUS

## 2015-07-24 MED ORDER — FAMOTIDINE IN NACL 20-0.9 MG/50ML-% IV SOLN
20.0000 mg | Freq: Once | INTRAVENOUS | Status: AC
  Administered 2015-07-24: 20 mg via INTRAVENOUS

## 2015-07-24 MED ORDER — SODIUM CHLORIDE 0.9 % IV SOLN
Freq: Once | INTRAVENOUS | Status: AC
  Administered 2015-07-24: 13:00:00 via INTRAVENOUS
  Filled 2015-07-24: qty 8

## 2015-07-24 MED ORDER — DIPHENHYDRAMINE HCL 50 MG/ML IJ SOLN
INTRAMUSCULAR | Status: AC
  Filled 2015-07-24: qty 1

## 2015-07-24 MED ORDER — SORBITOL 70 % PO SOLN: 30.0000 mL | Freq: Every day | ORAL | Status: DC | PRN

## 2015-07-24 MED ORDER — DIPHENHYDRAMINE HCL 50 MG/ML IJ SOLN
25.0000 mg | Freq: Once | INTRAMUSCULAR | Status: AC
  Administered 2015-07-24: 25 mg via INTRAVENOUS

## 2015-07-24 MED ORDER — SODIUM CHLORIDE 0.9 % IV SOLN
Freq: Once | INTRAVENOUS | Status: AC
  Administered 2015-07-24: 13:00:00 via INTRAVENOUS

## 2015-07-24 MED ORDER — DIPHENHYDRAMINE HCL 50 MG/ML IJ SOLN
25.0000 mg | Freq: Once | INTRAMUSCULAR | Status: AC | PRN
  Administered 2015-07-24: 25 mg via INTRAVENOUS

## 2015-07-24 MED ORDER — FAMOTIDINE IN NACL 20-0.9 MG/50ML-% IV SOLN
INTRAVENOUS | Status: AC
  Filled 2015-07-24: qty 50

## 2015-07-24 MED ORDER — PACLITAXEL CHEMO INJECTION 300 MG/50ML
25.0000 mg/m2 | Freq: Once | INTRAVENOUS | Status: AC
  Administered 2015-07-24: 48 mg via INTRAVENOUS
  Filled 2015-07-24: qty 8

## 2015-07-24 MED ORDER — CARBOPLATIN CHEMO INJECTION 450 MG/45ML
140.0000 mg | Freq: Once | INTRAVENOUS | Status: AC
  Administered 2015-07-24: 140 mg via INTRAVENOUS
  Filled 2015-07-24: qty 14

## 2015-07-24 NOTE — Patient Instructions (Signed)
[facility] Discharge Instructions for Patients Receiving Chemotherapy  Today you received the following chemotherapy agents Taxol/Carboplatin To help prevent nausea and vomiting after your treatment, we encourage you to take your nausea medication as prescribed.   If you develop nausea and vomiting that is not controlled by your nausea medication, call the clinic.   BELOW ARE SYMPTOMS THAT SHOULD BE REPORTED IMMEDIATELY:  *FEVER GREATER THAN 100.5 F  *CHILLS WITH OR WITHOUT FEVER  NAUSEA AND VOMITING THAT IS NOT CONTROLLED WITH YOUR NAUSEA MEDICATION  *UNUSUAL SHORTNESS OF BREATH  *UNUSUAL BRUISING OR BLEEDING  TENDERNESS IN MOUTH AND THROAT WITH OR WITHOUT PRESENCE OF ULCERS  *URINARY PROBLEMS  *BOWEL PROBLEMS  UNUSUAL RASH Items with * indicate a potential emergency and should be followed up as soon as possible.  Feel free to call the clinic you have any questions or concerns. The clinic phone number is [phone].  Please show the CHEMO ALERT CARD at check-in to the Emergency Department and triage nurse.   

## 2015-07-24 NOTE — Progress Notes (Signed)
OK to treat with CBC results from today per Dr. Benay Spice.

## 2015-07-24 NOTE — Progress Notes (Signed)
  [facility] OFFICE PROGRESS NOTE   Diagnosis: Esophagus cancer  INTERVAL HISTORY:   Patrick [name] returns as scheduled. He continues to have dysphagia. He reports tolerating some liquids. Patient has gastrostomy tube in place. [facility] is coming out to his home to help with PEG care and tube feedings.He has numbness in the fingers. This predated chemotherapy. Denies chest pain, shortness of breath, abdominal pain, nausea and vomiting. Having more constipation despite use of MiraLax and prune juice.    Objective:  Vital signs in last 24 hours:  Blood pressure 121/82, pulse 105, temperature 97.9 F (36.6 C), temperature source Oral, resp. rate 18, height 5\' 9"  (1.753 m), weight 143 lb 14.4 oz (65.273 kg), SpO2 100 %.    HEENT: No thrush or ulcers Resp: Lungs clear bilaterally Cardio: Regular rate and rhythm GI: Diffuse tenderness at the upper abdomen, G-tube site without evidence of infection, no hepatomegaly Vascular: Trace edema at the low leg bilaterally, the right lower leg appears slightly larger than left side. No erythema.   Lab Results:  Lab Results  Component Value Date   WBC 2.6* 07/24/2015   HGB 8.3* 07/24/2015   HCT 24.4* 07/24/2015   MCV 101.7* 07/24/2015   PLT 56* 07/24/2015   NEUTROABS 1.6 07/24/2015     Medications: I have reviewed the patient's current medications.  Assessment/Plan: 1. 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 No evidence of hypermetabolic nodal or distant metastasis.  Initiation of radiation 06/11/2015; initiation of weekly Taxol/carboplatin 06/11/2015 2. Cirrhosis 3. Solid  dysphagia secondary to #1 4. Hepatitis C 5. Polysubstance abuse 6. History of coronary artery disease 7. Thrombocytopenia-likely secondary to cirrhosis, hepatitis C, and alcohol use 8. Esophageal varices 9. Barrett's esophagus 10. Report of "Numbness" and leg pain-etiology unclear, neurology evaluation pending 11. Neutropenia secondary to chemotherapy, Taxol and carboplatin dose reduced with week #2 chemotherapy 06/25/2015 12. Gastrostomy feeding tube placement 06/26/2015   Disposition:  Patrick [name] counts are stable. He is already dose reduced. Discussed with Dr. Benay Spice and we will proceed with his chemotherapy as scheduled today. This will be cycle 5. Recheck CBC on Tuesday and Friday next week. Patient to call for fevers or bleeding.   Encouraged patient to use MirLax daily with prune juice. I have sent a prescription for Sorbitol to his pharmacy to be used daily as needed for constipation.   Plan reviewed with Dr. Benay Spice.  [name], DNP, AGPCNP-BC, AOCNP  07/24/2015  12:02 PM

## 2015-07-24 NOTE — Progress Notes (Signed)
1402: Patient woke from sleeping with a couch that lasted about 60 seconds. He described the cough as tingling in the back of this throat. Patient has tears in his eyes. Taxol paused, vitals signs obtained which were stable. Selena Lesser NP called and she assessed patient at chair side. Instruction to give additional Benadryl 25 mg as well as Solu-Medrol 125mg  IV. Patient had one additional coughing spell after PRN medications administered.  1424: Vitals signs stable. Patient resting comfortably. Selena Lesser to assess patient shortly.  1440: Patient resting comfortably. Selena Lesser NP instructed me to resume Taxol.

## 2015-07-24 NOTE — Progress Notes (Signed)
Addendum: Patient was in the midst of the Taxol portion of his Taxol/carboplatin chemotherapy regimen today; and awoke from sleep with a slight cough and a burning sensation in the back of his throat.  He denied any other new symptoms whatsoever.  The Taxol infusion was held; and patient received Benadryl 25 mg, and site Medrol 125 mg IV.  All symptoms completely resolved.  Vital signs remained stable throughout.  Patient was able to resume his chemotherapy with no further reactions.

## 2015-07-25 ENCOUNTER — Telehealth: Payer: Self-pay | Admitting: Oncology

## 2015-07-25 ENCOUNTER — Ambulatory Visit: Payer: Medicare HMO

## 2015-07-25 ENCOUNTER — Ambulatory Visit
Admission: RE | Admit: 2015-07-25 | Discharge: 2015-07-25 | Disposition: A | Discharge status: Home / Self Care | Payer: Medicare HMO | Source: Ambulatory Visit | Attending: Radiation Oncology | Admitting: Radiation Oncology

## 2015-07-25 ENCOUNTER — Encounter: Payer: Self-pay | Admitting: Radiation Oncology

## 2015-07-25 VITALS — BP 118/77 | HR 97 | Temp 97.7°F | Resp 20 | Wt 153.4 lb

## 2015-07-25 DIAGNOSIS — C154 Malignant neoplasm of middle third of esophagus: Secondary | ICD-10-CM

## 2015-07-25 DIAGNOSIS — Z510 Encounter for antineoplastic radiation therapy: Secondary | ICD-10-CM | POA: Diagnosis not present

## 2015-07-25 NOTE — Progress Notes (Signed)
  Radiation Oncology         [phone] ________________________________  Name: Patrick [name] MRN: [id number]  Date: 07/25/2015  DOB: March 28, 1951  Weekly Radiation Therapy Management    ICD-9-CM ICD-10-CM   1. 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 . . . . . . . The patient presents for routine under treatment assessment.                                   The patient is without complaint. He continues to have some dysphagia. . Patient has also developed some hiccups today and will let us know if this persists requiring medication.                                 Set-up films were reviewed.                                 The chart was checked. Physical Findings. . .  weight is 153 lb 6.4 oz (69.582 kg). His oral temperature is 97.7 F (36.5 C). His blood pressure is 118/77 and his pulse is 97. His respiration is 20 and oxygen saturation is 100%. . The lungs are clear. The heart has a regular rhythm and rate. The abdomen is soft and nontender with normal bowel sounds. PEG site clean without signs of infection. Impression . . . . . . . The patient is tolerating radiation. Plan . . . . . . . . . . . . Continue treatment as planned.  ________________________________   Blair Promise, PhD, MD

## 2015-07-25 NOTE — Telephone Encounter (Signed)
Called [phone] after patient left message he's "returning a missed call.  I do not know who called but returning call."  Voicemail left by this nurse with appointment dates, times and visit type.

## 2015-07-25 NOTE — Telephone Encounter (Signed)
Lvm advising appts scheduled 12/27, 12/30, and 08/07/15.

## 2015-07-26 ENCOUNTER — Ambulatory Visit
Admission: RE | Admit: 2015-07-26 | Discharge: 2015-07-26 | Disposition: A | Discharge status: Home / Self Care | Payer: Medicare HMO | Source: Ambulatory Visit | Attending: Radiation Oncology | Admitting: Radiation Oncology

## 2015-07-26 ENCOUNTER — Ambulatory Visit: Payer: Medicare HMO

## 2015-07-26 DIAGNOSIS — Z510 Encounter for antineoplastic radiation therapy: Secondary | ICD-10-CM | POA: Diagnosis not present

## 2015-07-27 ENCOUNTER — Ambulatory Visit: Payer: Medicare HMO

## 2015-07-27 ENCOUNTER — Ambulatory Visit
Admission: RE | Admit: 2015-07-27 | Discharge: 2015-07-27 | Disposition: A | Discharge status: Home / Self Care | Payer: Medicare HMO | Source: Ambulatory Visit | Attending: Radiation Oncology | Admitting: Radiation Oncology

## 2015-07-27 ENCOUNTER — Encounter: Payer: Self-pay | Admitting: Radiation Oncology

## 2015-07-27 VITALS — BP 145/67 | HR 95 | Temp 97.6°F | Resp 20 | Wt 152.2 lb

## 2015-07-27 DIAGNOSIS — Z510 Encounter for antineoplastic radiation therapy: Secondary | ICD-10-CM | POA: Diagnosis not present

## 2015-07-27 DIAGNOSIS — C154 Malignant neoplasm of middle third of esophagus: Secondary | ICD-10-CM

## 2015-07-27 NOTE — Progress Notes (Signed)
Department of Radiation Oncology  Phone:  [phone] Fax:        [phone]  Weekly Treatment Note    Name: Patrick [name] Date: 07/27/2015 MRN: [id number] DOB: 02-Feb-1951   Diagnosis:     ICD-9-CM ICD-10-CM   1. 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 aspirin 81 MG tablet Place 1 tablet (81 mg total) into feeding tube daily. 30 tablet   . atorvastatin (LIPITOR) 40 MG tablet Place 1 tablet (40 mg total) into feeding tube daily at 6 PM. 30 tablet 3  . carvedilol (COREG) 12.5 MG tablet Place 1 tablet (12.5 mg total) into feeding tube 2 (two) times daily with a meal.    . feeding supplement (BOOST / RESOURCE BREEZE) LIQD Take 1 Container by mouth 3 (three) times daily between meals. 90 Container 0  . folic acid (FOLVITE) 1 MG tablet Place 1 tablet (1 mg total) into feeding tube daily. 30 tablet 0  . furosemide (LASIX) 20 MG tablet Place 1 tablet (20 mg total) into feeding tube daily as needed for edema. Take 1 tablet as needed for fluid retention 30 tablet 3  . gabapentin (NEURONTIN) 300 MG capsule Place 1 capsule (300 mg total) into feeding tube 3 (three) times daily.    Marland Kitchen HYDROcodone-acetaminophen (HYCET) 7.5-325 mg/15 ml solution Take 10 mLs by mouth 2 (two) times daily as needed for moderate pain. 600 mL 0  . lisinopril (PRINIVIL,ZESTRIL) 20 MG tablet Place 1 tablet (20 mg total) into feeding tube daily. 30 tablet 0  . Multiple Vitamin (MULTIVITAMIN WITH MINERALS) TABS tablet Place 1 tablet into feeding tube daily. 30 tablet 0  . nicotine (NICODERM CQ - DOSED IN MG/24 HOURS) 21 mg/24hr patch Place 1 patch (21 mg total) onto the skin daily. 28 patch 0  . nitroGLYCERIN (NITROSTAT) 0.4 MG SL tablet Place 1 tablet (0.4 mg total) under the tongue every 5 (five) minutes as needed for chest pain. 25 tablet 2  . Nutritional Supplements  (FEEDING SUPPLEMENT, OSMOLITE 1.5 CAL,) LIQD Place 1,000 mLs into feeding tube continuous. 1000 mL 100  . polyethylene glycol (MIRALAX / GLYCOLAX) packet Place 17 g into feeding tube daily as needed for mild constipation. 14 each 0  . potassium chloride 20 MEQ/15ML (10%) SOLN Take 15 mLs (20 mEq total) by mouth daily. Take via PEG tube daily for 7 days. 105 mL 0  . Sennosides-Docusate Sodium (SENOKOT S PO) Take 2 tablets by mouth daily.    . sorbitol 70 % solution Take 30 mLs by mouth daily as needed. 473 mL 0  . triamcinolone cream (KENALOG) 0.1 % Apply 1 application topically 2 (two) times daily as needed (apply to rash).      No current facility-administered medications for this encounter.     ALLERGIES: Review of patient's allergies indicates no known allergies.   LABORATORY DATA:  Lab Results  Component Value Date   WBC 2.6* 07/24/2015   HGB 8.3* 07/24/2015   HCT 24.4* 07/24/2015   MCV 101.7* 07/24/2015   PLT 56* 07/24/2015   Lab Results  Component Value Date   NA 136 07/24/2015   K 4.2 07/24/2015   CL 116* 06/28/2015   CO2 24 07/24/2015   Lab Results  Component Value Date   ALT 105* 07/24/2015   AST 143* 07/24/2015   ALKPHOS 165* 07/24/2015   BILITOT  0.49 07/24/2015     NARRATIVE: Patrick [name] was seen today for weekly treatment management. The chart was checked and the patient's films were reviewed.  He has some difficulty moving his bowels. He has a lot of gas and nocturia. He is using senokot, miralax, and prune juice. He also has a sore throat and a hoarse voice.  BP 145/67 mmHg  Pulse 95  Temp(Src) 97.6 F (36.4 C) (Oral)  Resp 20  Wt 152 lb 3.2 oz (69.037 kg)  Wt Readings from Last 3 Encounters:  07/27/15 152 lb 3.2 oz (69.037 kg)  07/25/15 153 lb 6.4 oz (69.582 kg)  07/24/15 143 lb 14.4 oz (65.273 kg)   5:57 PM   PHYSICAL EXAMINATION: weight is 152 lb 3.2 oz (69.037 kg). His oral temperature is 97.6 F (36.4 C). His blood pressure is 145/67 and  his pulse is 95. His respiration is 20.  He is ambulatory with a cane. Some hyperpigmentation noted without desquamation in the treatment area.  ASSESSMENT: The patient is doing satisfactorily with treatment.  PLAN: We will continue with the patient's radiation treatment as planned. I advised the patient to use the Miralax twice a day.   This document serves as a record of services personally performed by Kyung Rudd, MD. It was created on his behalf by Darcus Austin, a trained medical scribe. The creation of this record is based on the scribe's personal observations and the provider's statements to them. This document has been checked and approved by the attending provider.

## 2015-07-27 NOTE — Progress Notes (Signed)
Seen Wednesday by Dr. Sondra Come, has to leave [facility] transportation to pick ptaient up in 5 minutes,paged MD vitals only done 3:07 PM BP 145/67 mmHg  Pulse 95  Temp(Src) 97.6 F (36.4 C) (Oral)  Resp 20  Wt 152 lb 3.2 oz (69.037 kg)  Wt Readings from Last 3 Encounters:  07/27/15 152 lb 3.2 oz (69.037 kg)  07/25/15 153 lb 6.4 oz (69.582 kg)  07/24/15 143 lb 14.4 oz (65.273 kg)

## 2015-07-28 ENCOUNTER — Ambulatory Visit: Payer: Medicare HMO

## 2015-07-31 ENCOUNTER — Encounter: Payer: Self-pay | Admitting: Radiation Oncology

## 2015-07-31 ENCOUNTER — Ambulatory Visit: Payer: Medicare HMO

## 2015-07-31 ENCOUNTER — Ambulatory Visit
Admission: RE | Admit: 2015-07-31 | Discharge: 2015-07-31 | Disposition: A | Discharge status: Home / Self Care | Payer: Medicare HMO | Source: Ambulatory Visit | Attending: Radiation Oncology | Admitting: Radiation Oncology

## 2015-07-31 ENCOUNTER — Other Ambulatory Visit (HOSPITAL_BASED_OUTPATIENT_CLINIC_OR_DEPARTMENT_OTHER): Payer: Medicare HMO

## 2015-07-31 VITALS — BP 118/85 | HR 115 | Temp 98.0°F | Resp 20 | Wt 145.7 lb

## 2015-07-31 DIAGNOSIS — C153 Malignant neoplasm of upper third of esophagus: Secondary | ICD-10-CM

## 2015-07-31 DIAGNOSIS — C154 Malignant neoplasm of middle third of esophagus: Secondary | ICD-10-CM

## 2015-07-31 DIAGNOSIS — Z510 Encounter for antineoplastic radiation therapy: Secondary | ICD-10-CM | POA: Diagnosis not present

## 2015-07-31 DIAGNOSIS — C159 Malignant neoplasm of esophagus, unspecified: Secondary | ICD-10-CM | POA: Diagnosis not present

## 2015-07-31 LAB — CBC WITH DIFFERENTIAL/PLATELET
BASO%: 0.5 % (ref 0.0–2.0)
BASOS ABS: 0 10*3/uL (ref 0.0–0.1)
EOS%: 0.5 % (ref 0.0–7.0)
Eosinophils Absolute: 0 10*3/uL (ref 0.0–0.5)
HCT: 26.7 % — ABNORMAL LOW (ref 38.4–49.9)
HGB: 8.8 g/dL — ABNORMAL LOW (ref 13.0–17.1)
LYMPH%: 18.2 % (ref 14.0–49.0)
MCH: 35.1 pg — AB (ref 27.2–33.4)
MCHC: 33.1 g/dL (ref 32.0–36.0)
MCV: 106 fL — AB (ref 79.3–98.0)
MONO#: 0.4 10*3/uL (ref 0.1–0.9)
MONO%: 15.8 % — ABNORMAL HIGH (ref 0.0–14.0)
NEUT#: 1.5 10*3/uL (ref 1.5–6.5)
NEUT%: 65 % (ref 39.0–75.0)
PLATELETS: 88 10*3/uL — AB (ref 140–400)
RBC: 2.52 10*6/uL — AB (ref 4.20–5.82)
RDW: 19.6 % — ABNORMAL HIGH (ref 11.0–14.6)
WBC: 2.4 10*3/uL — ABNORMAL LOW (ref 4.0–10.3)
lymph#: 0.4 10*3/uL — ABNORMAL LOW (ref 0.9–3.3)

## 2015-07-31 MED ORDER — NYSTATIN 100000 UNIT/ML MT SUSP: 5.0000 mL | Freq: Four times a day (QID) | OROMUCOSAL | Status: DC

## 2015-07-31 NOTE — Progress Notes (Signed)
Department of Radiation Oncology  Phone:  [phone] Fax:        [phone]  Weekly Treatment Note    Name: Patrick [name] Date: 07/31/2015 MRN: [id number]  DOB: 05/06/1951   Diagnosis:     ICD-9-CM ICD-10-CM   1. 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 Wound Dressings (SONAFINE) Apply 1 application topically daily.    Marland Kitchen aspirin 81 MG tablet Place 1 tablet (81 mg total) into feeding tube daily. 30 tablet   . atorvastatin (LIPITOR) 40 MG tablet Place 1 tablet (40 mg total) into feeding tube daily at 6 PM. 30 tablet 3  . carvedilol (COREG) 12.5 MG tablet Place 1 tablet (12.5 mg total) into feeding tube 2 (two) times daily with a meal.    . feeding supplement (BOOST / RESOURCE BREEZE) LIQD Take 1 Container by mouth 3 (three) times daily between meals. 90 Container 0  . folic acid (FOLVITE) 1 MG tablet Place 1 tablet (1 mg total) into feeding tube daily. 30 tablet 0  . furosemide (LASIX) 20 MG tablet Place 1 tablet (20 mg total) into feeding tube daily as needed for edema. Take 1 tablet as needed for fluid retention 30 tablet 3  . gabapentin (NEURONTIN) 300 MG capsule Place 1 capsule (300 mg total) into feeding tube 3 (three) times daily.    Marland Kitchen HYDROcodone-acetaminophen (HYCET) 7.5-325 mg/15 ml solution Take 10 mLs by mouth 2 (two) times daily as needed for moderate pain. 600 mL 0  . lisinopril (PRINIVIL,ZESTRIL) 20 MG tablet Place 1 tablet (20 mg total) into feeding tube daily. 30 tablet 0  . Multiple Vitamin (MULTIVITAMIN WITH MINERALS) TABS tablet Place 1 tablet into feeding tube daily. 30 tablet 0  . nicotine (NICODERM CQ - DOSED IN MG/24 HOURS) 21 mg/24hr patch Place 1 patch (21 mg total) onto the skin daily. 28 patch 0  . nitroGLYCERIN (NITROSTAT) 0.4 MG SL tablet Place 1 tablet (0.4 mg total) under the tongue every 5 (five) minutes  as needed for chest pain. 25 tablet 2  . Nutritional Supplements (FEEDING SUPPLEMENT, OSMOLITE 1.5 CAL,) LIQD Place 1,000 mLs into feeding tube continuous. 1000 mL 100  . polyethylene glycol (MIRALAX / GLYCOLAX) packet Place 17 g into feeding tube daily as needed for mild constipation. 14 each 0  . potassium chloride 20 MEQ/15ML (10%) SOLN Take 15 mLs (20 mEq total) by mouth daily. Take via PEG tube daily for 7 days. 105 mL 0  . Sennosides-Docusate Sodium (SENOKOT S PO) Take 2 tablets by mouth daily.    . sorbitol 70 % solution Take 30 mLs by mouth daily as needed. 473 mL 0  . triamcinolone cream (KENALOG) 0.1 % Apply 1 application topically 2 (two) times daily as needed (apply to rash).      No current facility-administered medications for this encounter.     ALLERGIES: Review of patient's allergies indicates no known allergies.   LABORATORY DATA:  Lab Results  Component Value Date   WBC 2.4* 07/31/2015   HGB 8.8* 07/31/2015   HCT 26.7* 07/31/2015   MCV 106.0* 07/31/2015   PLT 88* 07/31/2015   Lab Results  Component Value Date   NA 136 07/24/2015   K 4.2 07/24/2015   CL 116* 06/28/2015   CO2 24 07/24/2015   Lab Results  Component Value Date   ALT 105* 07/24/2015  AST 143* 07/24/2015   ALKPHOS 165* 07/24/2015   BILITOT 0.49 07/24/2015     NARRATIVE: Patrick [name] was seen today for weekly treatment management. The chart was checked and the patient's films were reviewed.  Weekly rad txs esophagus 27/30 completed, c/o burning in throat unable to swallow much, applesauce even burns,  glucerna via peg at night, wakes up in the mornings and tastes it in his throat and throws up,  And burns, stopped taking hycet,"didn't help any",  Took ortho static vitals, anorexia, and fatigue, weakness,  Tongue coated white, skin on his neck has erythema,  is tanned and dry, using sonafine cream daily. Denies numbness in lower extremities but is in pain which radiates upward.  BP 140/89 mmHg   Pulse 109  Temp(Src) 98 F (36.7 C) (Oral)  Resp 20  Wt 145 lb 11.2 oz (66.089 kg) - Siting vitals BP 118/85 mmHg  Pulse 115  Temp(Src) 98 F (36.7 C) (Oral)  Resp 20  Wt 145 lb 11.2 oz (66.089 kg) - Standing Vitals Wt Readings from Last 3 Encounters:  07/31/15 145 lb 11.2 oz (66.089 kg)  07/27/15 152 lb 3.2 oz (69.037 kg)  07/25/15 153 lb 6.4 oz (69.582 kg)   6:07 PM   PHYSICAL EXAMINATION: weight is 145 lb 11.2 oz (66.089 kg). His oral temperature is 98 F (36.7 C). His blood pressure is 118/85 and his pulse is 115. His respiration is 20.    ASSESSMENT: The patient is suffering with esophageal burning sensations which are consistent with esophageal candidiasis.  PLAN: We will continue with the patient's radiation treatment as planned.  Prescribed nystatin.   ------------------------------------------------    This document serves as a record of services personally performed by [name], MD. It was created on his behalf by Derek Mound, a trained medical scribe. The creation of this record is based on the scribe's personal observations and the provider's statements to them. This document has been checked and approved by the attending provider.

## 2015-07-31 NOTE — Progress Notes (Signed)
Weekly rad txs esophagus 27/30 completed, c/o burning in throat unable to swallow much, applesausce even burns,  glucerna via peg at night, wakes up in the mornings and tastes it in his throat and throws up,  And burns, stopped taking hycet,"didn't help any",  Took ortho static vitals, anorexia, and fatigue, weakness,  Tongue coated white, skin on his neck has erythema,  is tanned and dry, using sonafine cream daily BP 140/89 mmHg  Pulse 109  Temp(Src) 98 F (36.7 C) (Oral)  Resp 20  Wt 145 lb 11.2 oz (66.089 kg) siting vitals BP 118/85 mmHg  Pulse 115  Temp(Src) 98 F (36.7 C) (Oral)  Resp 20  Wt 145 lb 11.2 oz (66.089 kg) standing vitals  Wt Readings from Last 3 Encounters:  07/31/15 145 lb 11.2 oz (66.089 kg)  07/27/15 152 lb 3.2 oz (69.037 kg)  07/25/15 153 lb 6.4 oz (69.582 kg)

## 2015-08-01 ENCOUNTER — Ambulatory Visit
Admission: RE | Admit: 2015-08-01 | Discharge: 2015-08-01 | Disposition: A | Discharge status: Home / Self Care | Payer: Medicare HMO | Source: Ambulatory Visit | Attending: Radiation Oncology | Admitting: Radiation Oncology

## 2015-08-01 ENCOUNTER — Ambulatory Visit: Payer: Medicare HMO

## 2015-08-01 ENCOUNTER — Encounter: Payer: Self-pay | Admitting: Nutrition

## 2015-08-01 DIAGNOSIS — Z510 Encounter for antineoplastic radiation therapy: Secondary | ICD-10-CM | POA: Diagnosis not present

## 2015-08-01 NOTE — Progress Notes (Signed)
Patient canceled nutrition appointment on Friday, December 30. Patient concerned with transportation. I encouraged patient to call me with questions or concerns.   He is followed by advanced home[facility] nursing and tube feedings

## 2015-08-02 ENCOUNTER — Ambulatory Visit
Admission: RE | Admit: 2015-08-02 | Discharge: 2015-08-02 | Disposition: A | Discharge status: Home / Self Care | Payer: Medicare HMO | Source: Ambulatory Visit | Attending: Radiation Oncology | Admitting: Radiation Oncology

## 2015-08-02 ENCOUNTER — Ambulatory Visit (HOSPITAL_BASED_OUTPATIENT_CLINIC_OR_DEPARTMENT_OTHER): Payer: Self-pay | Admitting: Nurse Practitioner

## 2015-08-02 ENCOUNTER — Encounter: Payer: Self-pay | Admitting: Nurse Practitioner

## 2015-08-02 ENCOUNTER — Telehealth: Payer: Self-pay | Admitting: *Deleted

## 2015-08-02 ENCOUNTER — Encounter: Payer: Self-pay | Admitting: Nutrition

## 2015-08-02 ENCOUNTER — Ambulatory Visit: Payer: Medicare HMO

## 2015-08-02 ENCOUNTER — Other Ambulatory Visit: Payer: Self-pay | Admitting: *Deleted

## 2015-08-02 ENCOUNTER — Other Ambulatory Visit: Payer: Self-pay

## 2015-08-02 DIAGNOSIS — C154 Malignant neoplasm of middle third of esophagus: Secondary | ICD-10-CM

## 2015-08-02 DIAGNOSIS — Z510 Encounter for antineoplastic radiation therapy: Secondary | ICD-10-CM | POA: Diagnosis not present

## 2015-08-02 MED ORDER — ONDANSETRON HCL 8 MG PO TABS
8.0000 mg | ORAL_TABLET | Freq: Three times a day (TID) | ORAL | Status: DC | PRN
Start: 2015-08-02 — End: 2015-11-06

## 2015-08-02 NOTE — Progress Notes (Signed)
Pt's transportation required pt leaving instead of being seen today.  Pt will be rescheduled for tomorrow.

## 2015-08-02 NOTE — Progress Notes (Signed)
Received a phone call from Fernanda Drum, RN for [place]. She reports patient is having morning nausea after nocturnal tube feeding. Patient is not eating anything by mouth at this time secondary to esophagitis. Patient completes radiation therapy on Friday, December 30. Patient has been utilizing continuous nocturnal feedings for nutrition support. Weight has decreased and was documented as 145 pounds on December 27, decreased from 152 pounds, December 23. Recommended home health nurse enforce the importance of patient remaining elevated, during nocturnal feedings. Contacted Samantha RN from radiation oncology to request nausea medication for patient to take in the evenings before enteral feeds begin. Still attempting to contact patient for nutrition follow-up.  Patient had canceled his appointment tomorrow.

## 2015-08-02 NOTE — Telephone Encounter (Signed)
[facility] nurse called leaving message t hat patient has not received Tube feeding since 07-30-2015 due to nausea, vomiting and raw throat.  Message left with [facility] nutritionist. At time of this documenattion observe symptom management appointment later today.

## 2015-08-03 ENCOUNTER — Other Ambulatory Visit: Payer: Self-pay | Admitting: Nurse Practitioner

## 2015-08-03 ENCOUNTER — Encounter: Payer: Self-pay | Admitting: Radiation Oncology

## 2015-08-03 ENCOUNTER — Other Ambulatory Visit (HOSPITAL_BASED_OUTPATIENT_CLINIC_OR_DEPARTMENT_OTHER): Payer: Medicare HMO

## 2015-08-03 ENCOUNTER — Ambulatory Visit
Admission: RE | Admit: 2015-08-03 | Discharge: 2015-08-03 | Disposition: A | Discharge status: Home / Self Care | Payer: Medicare HMO | Source: Ambulatory Visit | Attending: Radiation Oncology | Admitting: Radiation Oncology

## 2015-08-03 ENCOUNTER — Ambulatory Visit (HOSPITAL_BASED_OUTPATIENT_CLINIC_OR_DEPARTMENT_OTHER): Payer: Medicare HMO | Admitting: Nurse Practitioner

## 2015-08-03 ENCOUNTER — Encounter: Payer: Medicare HMO | Admitting: Nutrition

## 2015-08-03 ENCOUNTER — Ambulatory Visit (HOSPITAL_COMMUNITY)
Admission: RE | Admit: 2015-08-03 | Discharge: 2015-08-03 | Disposition: A | Discharge status: Home / Self Care | Payer: Medicare HMO | Source: Ambulatory Visit | Attending: Radiation Oncology | Admitting: Radiation Oncology

## 2015-08-03 ENCOUNTER — Telehealth: Payer: Self-pay

## 2015-08-03 ENCOUNTER — Ambulatory Visit (HOSPITAL_COMMUNITY)
Admission: RE | Admit: 2015-08-03 | Discharge: 2015-08-03 | Disposition: A | Discharge status: Home / Self Care | Payer: Medicare HMO | Source: Ambulatory Visit | Attending: Nurse Practitioner | Admitting: Nurse Practitioner

## 2015-08-03 ENCOUNTER — Encounter: Payer: Self-pay | Admitting: Nurse Practitioner

## 2015-08-03 VITALS — BP 125/83 | HR 98 | Temp 98.4°F | Ht 69.0 in | Wt 145.0 lb

## 2015-08-03 DIAGNOSIS — C154 Malignant neoplasm of middle third of esophagus: Secondary | ICD-10-CM | POA: Insufficient documentation

## 2015-08-03 DIAGNOSIS — Z510 Encounter for antineoplastic radiation therapy: Secondary | ICD-10-CM | POA: Diagnosis not present

## 2015-08-03 DIAGNOSIS — K5902 Outlet dysfunction constipation: Secondary | ICD-10-CM

## 2015-08-03 DIAGNOSIS — R112 Nausea with vomiting, unspecified: Secondary | ICD-10-CM

## 2015-08-03 DIAGNOSIS — K5900 Constipation, unspecified: Secondary | ICD-10-CM | POA: Insufficient documentation

## 2015-08-03 DIAGNOSIS — C153 Malignant neoplasm of upper third of esophagus: Secondary | ICD-10-CM

## 2015-08-03 DIAGNOSIS — G893 Neoplasm related pain (acute) (chronic): Secondary | ICD-10-CM | POA: Diagnosis not present

## 2015-08-03 LAB — CBC WITH DIFFERENTIAL/PLATELET
BASO%: 0.3 % (ref 0.0–2.0)
BASOS ABS: 0 10*3/uL (ref 0.0–0.1)
EOS ABS: 0 10*3/uL (ref 0.0–0.5)
EOS%: 0.2 % (ref 0.0–7.0)
HEMATOCRIT: 26.6 % — AB (ref 38.4–49.9)
HGB: 8.6 g/dL — ABNORMAL LOW (ref 13.0–17.1)
LYMPH#: 0.5 10*3/uL — AB (ref 0.9–3.3)
LYMPH%: 18.5 % (ref 14.0–49.0)
MCH: 34.4 pg — AB (ref 27.2–33.4)
MCHC: 32.5 g/dL (ref 32.0–36.0)
MCV: 105.9 fL — AB (ref 79.3–98.0)
MONO#: 0.7 10*3/uL (ref 0.1–0.9)
MONO%: 24.6 % — ABNORMAL HIGH (ref 0.0–14.0)
NEUT#: 1.5 10*3/uL (ref 1.5–6.5)
NEUT%: 56.4 % (ref 39.0–75.0)
PLATELETS: 91 10*3/uL — AB (ref 140–400)
RBC: 2.51 10*6/uL — ABNORMAL LOW (ref 4.20–5.82)
RDW: 20.6 % — ABNORMAL HIGH (ref 11.0–14.6)
WBC: 2.7 10*3/uL — ABNORMAL LOW (ref 4.0–10.3)

## 2015-08-03 MED ORDER — SUCRALFATE 1 G PO TABS
1.0000 g | ORAL_TABLET | Freq: Three times a day (TID) | ORAL | Status: DC
Start: 2015-08-03 — End: 2015-10-18

## 2015-08-03 MED ORDER — OXYCODONE-ACETAMINOPHEN 5-325 MG PO TABS: 1.0000 | ORAL_TABLET | Freq: Four times a day (QID) | ORAL | Status: DC | PRN

## 2015-08-03 NOTE — Progress Notes (Signed)
SYMPTOM MANAGEMENT CLINIC   HPI: Patrick [name] 64 y.o. male diagnosed with esophageal cancer.  Patient is status post chemotherapy completed on 07/24/2015.  Patient completed his final radiation treatment today.  Patient states that he was experiencing vomiting after his tube feeding boluses; so he has been holding all tube feeds.  He has antinausea medication at home; but only takes it occasionally.  Patient also reports no bowel movement whatsoever within this past week since he has stopped his tube feeds.  He has not tried any laxities.  Patient also denies any recent fevers.  Patient reports chronic pain to his esophageal area and to all of his extremities.  He states that he received a pain medicine refill per Dr. Tammi Klippel earlier today.  Also, patient has a neurologist appointment on 08/10/2015 as well.  HPI  ROS  Past Medical History  Diagnosis Date  . MI (myocardial infarction) ([place])     x3, jan, aug, nov 2011; treated at [facility] in [place]  . Sexual dysfunction   . HTN (hypertension)   . Hyperlipidemia   . Chronic chest pain   . CAD (coronary artery disease)   . Thrombocytopenia ([place])   . SSBE (short-segment Barrett's esophagus) 11/11/11  . Colon adenomas 11/11/11    x 5, one rectal tv adenoma  . GERD (gastroesophageal reflux disease)   . 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 Barrett's esophagus 11/17/2011    Short-segment dx 11/2011   . Arthritis     knees  . Cataract     bil removed  . Anemia   . Alcoholism ([place]) 01/02/2012    cocaine abuse, herion abuse in past    Past Surgical History  Procedure Laterality Date  . Hernia repair      very young  . Balloon angioplasty, artery  03/2010  . Colonoscopy  11/11/11    Dr. Silvano Rusk  . Esophagogastroduodenoscopy  11/11/11    Dr. Silvano Rusk  . Cardiac catheterization  01/31/2011  . Coronary angioplasty    . Cataract extraction w/phaco  08/18/2012    Procedure: CATARACT EXTRACTION PHACO AND  INTRAOCULAR LENS PLACEMENT (IOC);  Surgeon: Adonis Brook, MD;  Location: [facility];  Service: Ophthalmology;  Laterality: Right;  . Cataract extraction w/phaco Left 09/22/2012    Procedure: CATARACT EXTRACTION PHACO AND INTRAOCULAR LENS PLACEMENT (IOC);  Surgeon: Adonis Brook, MD;  Location: [facility];  Service: Ophthalmology;  Laterality: Left;  . Upper gastrointestinal endoscopy    . Esophagogastroduodenoscopy (egd) with propofol N/A 04/27/2015    Procedure: ESOPHAGOGASTRODUODENOSCOPY (EGD) WITH PROPOFOL;  Surgeon: Jerene Bears, MD;  Location: [facility] ENDOSCOPY;  Service: Endoscopy;  Laterality: N/A;  . Peg placement N/A 06/26/2015    Procedure: PERCUTANEOUS ENDOSCOPIC GASTROSTOMY (PEG) PLACEMENT;  Surgeon: Gatha Mayer, MD;  Location: [facility] ENDOSCOPY;  Service: Endoscopy;  Laterality: N/A;    has Chest pain; CAD (coronary artery disease); Hyperlipidemia; Tobacco abuse; Cocaine abuse; Thrombocytopenia ([place]); History of colonic polyps; Barrett's esophagus; Leukopenia and Thrombocytopenia; Chronic hepatitis C with cirrhosis ([place]); Alcoholism ([place]); Polysubstance abuse; Dysphagia; Abnormal ECG; Essential hypertension; Fall; Numbness; Protein-calorie malnutrition, moderate ([place]); Weakness; Abdominal pain; Pancreatitis; Alcohol abuse; Alcohol-induced acute pancreatitis; Abnormal CT scan, esophagus; Abnormal weight loss; Dysphagia, pharyngoesophageal phase; 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    has No Known Allergies.    Medication List  This list is accurate as of: 08/03/15  6:32 PM.  Always use your most recent med list.               aspirin 81 MG tablet  Place 1 tablet (81 mg total) into feeding tube daily.     atorvastatin 40 MG tablet  Commonly known as:  LIPITOR  Place 1 tablet (40 mg total) into  feeding tube daily at 6 PM.     carvedilol 12.5 MG tablet  Commonly known as:  COREG  Place 1 tablet (12.5 mg total) into feeding tube 2 (two) times daily with a meal.     feeding supplement Liqd  Take 1 Container by mouth 3 (three) times daily between meals.     feeding supplement (OSMOLITE 1.5 CAL) Liqd  Place 1,000 mLs into feeding tube continuous.     folic acid 1 MG tablet  Commonly known as:  FOLVITE  Place 1 tablet (1 mg total) into feeding tube daily.     furosemide 20 MG tablet  Commonly known as:  LASIX  Place 1 tablet (20 mg total) into feeding tube daily as needed for edema. Take 1 tablet as needed for fluid retention     gabapentin 300 MG capsule  Commonly known as:  NEURONTIN  Place 1 capsule (300 mg total) into feeding tube 3 (three) times daily.     HYDROcodone-acetaminophen 7.5-325 mg/15 ml solution  Commonly known as:  HYCET  Take 10 mLs by mouth 2 (two) times daily as needed for moderate pain.     lisinopril 20 MG tablet  Commonly known as:  PRINIVIL,ZESTRIL  Place 1 tablet (20 mg total) into feeding tube daily.     multivitamin with minerals Tabs tablet  Place 1 tablet into feeding tube daily.     nicotine 21 mg/24hr patch  Commonly known as:  NICODERM CQ - dosed in mg/24 hours  Place 1 patch (21 mg total) onto the skin daily.     nitroGLYCERIN 0.4 MG SL tablet  Commonly known as:  NITROSTAT  Place 1 tablet (0.4 mg total) under the tongue every 5 (five) minutes as needed for chest pain.     nystatin 100000 UNIT/ML suspension  Commonly known as:  MYCOSTATIN  Take 5 mLs (500,000 Units total) by mouth 4 (four) times daily.     ondansetron 8 MG tablet  Commonly known as:  ZOFRAN  Take 1 tablet (8 mg total) by mouth every 8 (eight) hours as needed for nausea or vomiting.     oxyCODONE-acetaminophen 5-325 MG tablet  Commonly known as:  PERCOCET/ROXICET  Take 1-2 tablets by mouth every 6 (six) hours as needed for severe pain.     polyethylene glycol  packet  Commonly known as:  MIRALAX / GLYCOLAX  Place 17 g into feeding tube daily as needed for mild constipation.     SENOKOT S PO  Take 2 tablets by mouth daily. Reported on 08/03/2015     SONAFINE  Apply 1 application topically daily. Reported on 08/03/2015     sorbitol 70 % solution  Take 30 mLs by mouth daily as needed.     sucralfate 1 g tablet  Commonly known as:  CARAFATE  Take 1 tablet (1 g total) by mouth 4 (four) times daily -  with meals and at bedtime. 5 min before meals for radiation induced esophagitis     triamcinolone cream 0.1 %  Commonly known as:  KENALOG  Apply 1 application topically 2 (  two) times daily as needed (apply to rash). Reported on 08/03/2015         PHYSICAL EXAMINATION  Oncology Vitals 08/03/2015 07/31/2015  Height 175 cm -  Weight 65.772 kg -  Weight (lbs) 145 lbs -  BMI (kg/m2) 21.41 kg/m2 -  Temp 98.4 -  Pulse 98 115  Resp - 20  SpO2 100 -  BSA (m2) 1.79 m2 -   BP Readings from Last 2 Encounters:  08/03/15 125/83  07/31/15 118/85    Physical Exam  Constitutional: He is oriented to person, place, and time. Vital signs are normal. He appears malnourished and dehydrated. He appears unhealthy. He appears cachectic.  HENT:  Head: Normocephalic and atraumatic.  Mouth/Throat: Oropharynx is clear and moist.  Eyes: Conjunctivae and EOM are normal. Pupils are equal, round, and reactive to light. Right eye exhibits no discharge. Left eye exhibits no discharge. No scleral icterus.  Neck: Normal range of motion. Neck supple. No JVD present. No tracheal deviation present. No thyromegaly present.  Cardiovascular: Normal rate, regular rhythm, normal heart sounds and intact distal pulses.   Pulmonary/Chest: Effort normal and breath sounds normal. No respiratory distress. He has no wheezes. He has no rales. He exhibits no tenderness.  Abdominal: He exhibits no distension and no mass. There is no tenderness. There is no rebound and no guarding.    Peg tube intact.  Musculoskeletal: Normal range of motion. He exhibits no edema or tenderness.  Lymphadenopathy:    He has no cervical adenopathy.  Neurological: He is alert and oriented to person, place, and time.  Walks with the assistance of a cane.  Skin: Skin is warm and dry. No rash noted. No erythema. No pallor.  Psychiatric: Affect normal.  Nursing note and vitals reviewed.   LABORATORY DATA:. Appointment on 08/03/2015  Component Date Value Ref Range Status  . WBC 08/03/2015 2.7* 4.0 - 10.3 10e3/uL Final  . NEUT# 08/03/2015 1.5  1.5 - 6.5 10e3/uL Final  . HGB 08/03/2015 8.6* 13.0 - 17.1 g/dL Final  . HCT 08/03/2015 26.6* 38.4 - 49.9 % Final  . Platelets 08/03/2015 91* 140 - 400 10e3/uL Final  . MCV 08/03/2015 105.9* 79.3 - 98.0 fL Final  . MCH 08/03/2015 34.4* 27.2 - 33.4 pg Final  . MCHC 08/03/2015 32.5  32.0 - 36.0 g/dL Final  . RBC 08/03/2015 2.51* 4.20 - 5.82 10e6/uL Final  . RDW 08/03/2015 20.6* 11.0 - 14.6 % Final  . lymph# 08/03/2015 0.5* 0.9 - 3.3 10e3/uL Final  . MONO# 08/03/2015 0.7  0.1 - 0.9 10e3/uL Final  . Eosinophils Absolute 08/03/2015 0.0  0.0 - 0.5 10e3/uL Final  . Basophils Absolute 08/03/2015 0.0  0.0 - 0.1 10e3/uL Final  . NEUT% 08/03/2015 56.4  39.0 - 75.0 % Final  . LYMPH% 08/03/2015 18.5  14.0 - 49.0 % Final  . MONO% 08/03/2015 24.6* 0.0 - 14.0 % Final  . EOS% 08/03/2015 0.2  0.0 - 7.0 % Final  . BASO% 08/03/2015 0.3  0.0 - 2.0 % Final     RADIOGRAPHIC STUDIES: Dg Abd 1 View  08/03/2015  CLINICAL DATA:  64 year old male with constipation for 1 week. Vomiting following feeding through PEG tube. Esophageal cancer. EXAM: ABDOMEN - 1 VIEW COMPARISON:  01/31/2011 CT FINDINGS: A PEG tube is noted. Moderate amount of stool in the colon is present. Nondistended gas-filled loops of small bowel are identified. Right renal calcifications are unchanged. No acute bony abnormalities noted. IMPRESSION: Nonspecific nonobstructive bowel gas pattern. Moderate  amount  of colonic stool. Electronically Signed   By: Margarette Canada M.D.   On: 08/03/2015 17:08    ASSESSMENT/PLAN:    Cancer of thoracic esophagus [facility]) Patient last received carboplatin/Taxol chemotherapy regimen on 07/24/2015.  He just completed his last radiation treatment today.  Blood counts obtained today reveal a WBC of 2.7, ANC 1.5, hemoglobin 8.6, platelet count 91.  Will reschedule patient for labs at 10 AM and a symptom management clinic appointment at approximately 10:30 AM on Tuesday, 08/07/2015 for possible IV fluid rehydration.  Constipation Patient has history of chronic constipation.  Patient states that he was experiencing vomiting after his tube feeding boluses; so he has been holding all tube feeds.  He states he has not had a bowel movement for approximately one week-since he stopped his tube feedings.  He has had little oral intake.  He states that he has occasionally tried some laxitives; but is not taking any within this past week.  On exam.  Abdomen was soft and bowel sounds positive in all 4 quads.  There is no tenderness.  PEG tube intact.  KUB obtained today revealed moderate constipation; but no obstruction.  Patient was advised to take stool softeners twice daily and Miralax every 6 hours as needed to clear constipation.    Cancer associated pain Patient complains of chronic pain to his esophageal area; as well as all of his extremities.  Patient states that he received 8 oxycodone refill per Dr. Tammi Klippel radiation oncologist today.  Nausea with vomiting Patient states that he was experiencing vomiting after his tube feeding boluses; so he has been holding all tube feeds.  He has antinausea medication at home; but only takes it occasionally.  On exam.  Abdomen was soft and bowel sounds positive in all 4 quads.  There is no tenderness.  PEG tube intact.  KUB obtained today revealed moderate constipation; but no obstruction.  Patient was advised to take  stool softeners twice daily and Miralax every 6 hours as needed to clear constipation.  Patient was advised to increase his water boluses via PEG; and also start adding back some tube feeding slowly as well.  Patient appears mildly dehydrated today; the patient was unable to obtain IV fluid rehydration at the [facility] today since it was so late in the day and patient relies on public transportation.  Advised patient that he may need to follow-up in the emergency department over the weekend if he continues to feel dehydrated or has other new symptoms.  Also, the plan is for the patient to return this coming Tuesday, 08/07/2015 for labs and probable IV fluid rehydration.    Patient stated understanding of all instructions; and was in agreement with this plan of care. The patient knows to call the clinic with any problems, questions or concerns.   Review/collaboration with Dr. Benay Spice regarding all aspects of patient's visit today.   Total time spent with patient was 40 minutes;  with greater than 75 percent of that time spent in face to face counseling regarding patient's symptoms,  and coordination of care and follow up.  Disclaimer:This dictation was prepared with Dragon/digital dictation along with [employer]. Any transcriptional errors that result from this process are unintentional.  Drue Second, NP 08/03/2015

## 2015-08-03 NOTE — Assessment & Plan Note (Signed)
Patient last received carboplatin/Taxol chemotherapy regimen on 07/24/2015.  He just completed his last radiation treatment today.  Blood counts obtained today reveal a WBC of 2.7, ANC 1.5, hemoglobin 8.6, platelet count 91.  Will reschedule patient for labs at 10 AM and a symptom management clinic appointment at approximately 10:30 AM on Tuesday, 08/07/2015 for possible IV fluid rehydration.

## 2015-08-03 NOTE — Progress Notes (Signed)
  Radiation Oncology         [phone]   Name: Patrick Patrick MRN: [id number]   Date: 08/03/2015  DOB: [birth year]     Weekly Radiation Therapy Management    ICD-9-CM ICD-10-CM   1. Cancer of thoracic esophagus (HCC) 150.1 C15.4 oxyCODONE-acetaminophen (PERCOCET/ROXICET) 5-325 MG tablet     sucralfate (CARAFATE) 1 g tablet    Current Dose: 54 Gy  Planned Dose:  54 Gy  Narrative The patient presents for routine under treatment assessment.  Patrick Patrick reports that he is having pain/stinging and difficulty swallowing at this time. He has pain in his lower extremities and his shins which he grades as a level 9/10. His oral cavity and throat is clear at this time. VSS. Currently states that he has emesis in the morning after his nocturnal enteral feeds.  The patient is without complaint. Set-up films were reviewed. The chart was checked.  Physical Findings  height is 5\' 9"  (1.753 m) and weight is 145 lb (65.772 kg). His temperature is 98.4 F (36.9 C). His blood pressure is 125/83 and his pulse is 98. His oxygen saturation is 100%. . Weight essentially stable.  No significant changes.  Impression The patient is tolerating radiation.  Plan Continue treatment as planned. He was referred to a Neurologist with an appointment on 08/10/2015. I prescribed him for Carafate tablets and 60 tablets of Oxycodone.          Patrick Patrick Tammi Klippel, M.D.  This document serves as a record of services personally performed by Patrick Pita, MD. It was created on his behalf by Lendon Collar, a trained medical scribe. The creation of this record is based on the scribe's personal observations and the provider's statements to them. This document has been checked and approved by the attending provider.

## 2015-08-03 NOTE — Progress Notes (Addendum)
Patrick [name] reports that he is having pain/stinging and difficulty swallowing at this time, in addition to, pain in his lower extremities and his shins which he grades as a level 9/10. Oral cavity and throat clear at this time.  He has been referred to a Neurologist with an appoinmentt on 08/10/2015. VSS.  Currently states that he is has emesis in the am after his nocturnal enteral feeds.    Patient Vitals for the past 24 hrs:  BP Temp Pulse SpO2 Height Weight  08/03/15 1449 125/83 mmHg 98.4 F (36.9 C) 98 100 % 5\' 9"  (1.753 m) 145 lb (65.772 kg)

## 2015-08-03 NOTE — Telephone Encounter (Signed)
Called [facility] this AM to let them know the pt will be seeing [facility] this afternoon and will need a later pickup from [facility]. [facility] operator said we will need to call [facility] when he is ready for pickup. I explained I called [facility] yesterday afternoon and talked with someone who transferred the call, I was then not able to speak with anyone else. Called pt this am and explained this situation. Also explained the reason for [facility] visit - his nausea, vomiting, and inability to take PM tube feeds. He expressed frustration in the cancer center taking so much of his time he is unable to keep up any of his other medical needs. He then explained he lives by himself and it was his being able to talk with someone and express his feelings, he was grateful for me listening.

## 2015-08-03 NOTE — Assessment & Plan Note (Addendum)
Patient has history of chronic constipation.  Patient states that he was experiencing vomiting after his tube feeding boluses; so he has been holding all tube feeds.  He states he has not had a bowel movement for approximately one week-since he stopped his tube feedings.  He has had little oral intake.  He states that he has occasionally tried some laxitives; but is not taking any within this past week.  On exam.  Abdomen was soft and bowel sounds positive in all 4 quads.  There is no tenderness.  PEG tube intact.  KUB obtained today revealed moderate constipation; but no obstruction.  Patient was advised to take stool softeners twice daily and Miralax every 6 hours as needed to clear constipation.

## 2015-08-03 NOTE — Assessment & Plan Note (Signed)
Patient complains of chronic pain to his esophageal area; as well as all of his extremities.  Patient states that he received 8 oxycodone refill per Dr. Tammi Klippel radiation oncologist today.

## 2015-08-03 NOTE — Progress Notes (Signed)
Correction: Pt requests 1/3 labs at 11 am; and [facility] appt at 11:30 am.

## 2015-08-03 NOTE — Assessment & Plan Note (Signed)
Patient states that he was experiencing vomiting after his tube feeding boluses; so he has been holding all tube feeds.  He has antinausea medication at home; but only takes it occasionally.  On exam.  Abdomen was soft and bowel sounds positive in all 4 quads.  There is no tenderness.  PEG tube intact.  KUB obtained today revealed moderate constipation; but no obstruction.  Patient was advised to take stool softeners twice daily and Miralax every 6 hours as needed to clear constipation.  Patient was advised to increase his water boluses via PEG; and also start adding back some tube feeding slowly as well.  Patient appears mildly dehydrated today; the patient was unable to obtain IV fluid rehydration at the [facility] today since it was so late in the day and patient relies on public transportation.  Advised patient that he may need to follow-up in the emergency department over the weekend if he continues to feel dehydrated or has other new symptoms.  Also, the plan is for the patient to return this coming Tuesday, 08/07/2015 for labs and probable IV fluid rehydration.

## 2015-08-04 ENCOUNTER — Telehealth: Payer: Self-pay | Admitting: Nurse Practitioner

## 2015-08-04 NOTE — Telephone Encounter (Signed)
Per pof patient is aware of time changes on 08/07/15

## 2015-08-06 ENCOUNTER — Telehealth: Payer: Self-pay | Admitting: Physician Assistant

## 2015-08-06 MED ORDER — NITROGLYCERIN 0.4 MG SL SUBL: 0.4000 mg | SUBLINGUAL_TABLET | SUBLINGUAL | Status: AC | PRN

## 2015-08-06 NOTE — Telephone Encounter (Signed)
Patient called needing Nitro refilled.  [name]

## 2015-08-07 ENCOUNTER — Encounter: Payer: Medicare HMO | Admitting: Nurse Practitioner

## 2015-08-07 ENCOUNTER — Other Ambulatory Visit: Payer: Medicare HMO

## 2015-08-07 ENCOUNTER — Telehealth: Payer: Self-pay | Admitting: Nurse Practitioner

## 2015-08-07 ENCOUNTER — Other Ambulatory Visit (HOSPITAL_BASED_OUTPATIENT_CLINIC_OR_DEPARTMENT_OTHER): Payer: Medicare HMO

## 2015-08-07 ENCOUNTER — Ambulatory Visit: Payer: Medicare HMO | Admitting: Nurse Practitioner

## 2015-08-07 ENCOUNTER — Ambulatory Visit (HOSPITAL_BASED_OUTPATIENT_CLINIC_OR_DEPARTMENT_OTHER): Payer: Medicare HMO | Admitting: Nurse Practitioner

## 2015-08-07 ENCOUNTER — Encounter: Payer: Self-pay | Admitting: Nurse Practitioner

## 2015-08-07 VITALS — BP 129/84 | HR 102 | Temp 98.3°F | Resp 18 | Ht 69.0 in | Wt 149.4 lb

## 2015-08-07 DIAGNOSIS — K5900 Constipation, unspecified: Secondary | ICD-10-CM

## 2015-08-07 DIAGNOSIS — C154 Malignant neoplasm of middle third of esophagus: Secondary | ICD-10-CM

## 2015-08-07 DIAGNOSIS — R112 Nausea with vomiting, unspecified: Secondary | ICD-10-CM

## 2015-08-07 DIAGNOSIS — C153 Malignant neoplasm of upper third of esophagus: Secondary | ICD-10-CM

## 2015-08-07 LAB — CBC WITH DIFFERENTIAL/PLATELET
BASO%: 0.5 % (ref 0.0–2.0)
Basophils Absolute: 0 10*3/uL (ref 0.0–0.1)
EOS%: 0.6 % (ref 0.0–7.0)
Eosinophils Absolute: 0 10*3/uL (ref 0.0–0.5)
HCT: 28.1 % — ABNORMAL LOW (ref 38.4–49.9)
HGB: 9.3 g/dL — ABNORMAL LOW (ref 13.0–17.1)
LYMPH%: 26.6 % (ref 14.0–49.0)
MCH: 34.9 pg — ABNORMAL HIGH (ref 27.2–33.4)
MCHC: 33 g/dL (ref 32.0–36.0)
MCV: 105.7 fL — AB (ref 79.3–98.0)
MONO#: 0.4 10*3/uL (ref 0.1–0.9)
MONO%: 23.2 % — ABNORMAL HIGH (ref 0.0–14.0)
NEUT#: 0.9 10*3/uL — ABNORMAL LOW (ref 1.5–6.5)
NEUT%: 49.1 % (ref 39.0–75.0)
PLATELETS: 79 10*3/uL — AB (ref 140–400)
RBC: 2.66 10*6/uL — AB (ref 4.20–5.82)
RDW: 20 % — ABNORMAL HIGH (ref 11.0–14.6)
WBC: 1.8 10*3/uL — ABNORMAL LOW (ref 4.0–10.3)
lymph#: 0.5 10*3/uL — ABNORMAL LOW (ref 0.9–3.3)

## 2015-08-07 LAB — HOLD TUBE, BLOOD BANK

## 2015-08-07 LAB — COMPREHENSIVE METABOLIC PANEL
ALT: 99 U/L — AB (ref 0–55)
ANION GAP: 8 meq/L (ref 3–11)
AST: 132 U/L — ABNORMAL HIGH (ref 5–34)
Albumin: 2.9 g/dL — ABNORMAL LOW (ref 3.5–5.0)
Alkaline Phosphatase: 144 U/L (ref 40–150)
BUN: 4.6 mg/dL — ABNORMAL LOW (ref 7.0–26.0)
CO2: 22 meq/L (ref 22–29)
Calcium: 9.2 mg/dL (ref 8.4–10.4)
Chloride: 105 mEq/L (ref 98–109)
Creatinine: 0.8 mg/dL (ref 0.7–1.3)
Glucose: 105 mg/dl (ref 70–140)
POTASSIUM: 4 meq/L (ref 3.5–5.1)
Sodium: 136 mEq/L (ref 136–145)
Total Bilirubin: 0.78 mg/dL (ref 0.20–1.20)
Total Protein: 7.6 g/dL (ref 6.4–8.3)

## 2015-08-07 NOTE — Progress Notes (Signed)
SYMPTOM MANAGEMENT CLINIC   HPI: Patrick [name] 65 y.o. male diagnosed with esophageal cancer.  Patient is status post chemotherapy completed on 07/24/2015.  Patient completed his final radiation treatment on 08/02/15.   Patient states that he was experiencing vomiting after his tube feeding boluses; so he has been holding all tube feeds.  He has antinausea medication at home; but only takes it occasionally.  Patient also reports no bowel movement whatsoever within this past week since he has stopped his tube feeds.  He has not tried any laxities.  Patient also denies any recent fevers.  Patient reports chronic pain to his esophageal area and to all of his extremities.  He states that he received a pain medicine refill per Dr. Tammi Klippel earlier today.  Also, patient has a neurologist appointment on 08/10/2015 as well. ________________________________________________  Patient returned to the [facility] today for follow-up.  He states that he has been able to eat some soft foods by mouth with no difficulty.  Over the weekend.  He has plans to call his home health nurse either today or tomorrow regarding the resumption of his tube feeds at a lower rate as well.  He states that his constipation has slightly improved; since he is using more of the laxity.  He denies any recent fevers or chills.  HPI  ROS  Past Medical History  Diagnosis Date  . MI (myocardial infarction) ([place])     x3, jan, aug, nov 2011; treated at [facility] in [place]  . Sexual dysfunction   . HTN (hypertension)   . Hyperlipidemia   . Chronic chest pain   . CAD (coronary artery disease)   . Thrombocytopenia ([place])   . SSBE (short-segment Barrett's esophagus) 11/11/11  . Colon adenomas 11/11/11    x 5, one rectal tv adenoma  . GERD (gastroesophageal reflux disease)   . 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 Barrett's esophagus 11/17/2011    Short-segment dx 11/2011   . Arthritis     knees  . Cataract    bil removed  . Anemia   . Alcoholism ([place]) 01/02/2012    cocaine abuse, herion abuse in past    Past Surgical History  Procedure Laterality Date  . Hernia repair      very young  . Balloon angioplasty, artery  03/2010  . Colonoscopy  11/11/11    Dr. Silvano Rusk  . Esophagogastroduodenoscopy  11/11/11    Dr. Silvano Rusk  . Cardiac catheterization  01/31/2011  . Coronary angioplasty    . Cataract extraction w/phaco  08/18/2012    Procedure: CATARACT EXTRACTION PHACO AND INTRAOCULAR LENS PLACEMENT (IOC);  Surgeon: Adonis Brook, MD;  Location: [facility];  Service: Ophthalmology;  Laterality: Right;  . Cataract extraction w/phaco Left 09/22/2012    Procedure: CATARACT EXTRACTION PHACO AND INTRAOCULAR LENS PLACEMENT (IOC);  Surgeon: Adonis Brook, MD;  Location: [facility];  Service: Ophthalmology;  Laterality: Left;  . Upper gastrointestinal endoscopy    . Esophagogastroduodenoscopy (egd) with propofol N/A 04/27/2015    Procedure: ESOPHAGOGASTRODUODENOSCOPY (EGD) WITH PROPOFOL;  Surgeon: Jerene Bears, MD;  Location: [facility] ENDOSCOPY;  Service: Endoscopy;  Laterality: N/A;  . Peg placement N/A 06/26/2015    Procedure: PERCUTANEOUS ENDOSCOPIC GASTROSTOMY (PEG) PLACEMENT;  Surgeon: Gatha Mayer, MD;  Location: [facility] ENDOSCOPY;  Service: Endoscopy;  Laterality: N/A;    has Chest pain; CAD (coronary artery disease); Hyperlipidemia; Tobacco abuse; Cocaine abuse; Thrombocytopenia ([place]); History of colonic polyps; Barrett's esophagus; Leukopenia and Thrombocytopenia; Chronic  hepatitis C with cirrhosis ([place]); Alcoholism ([place]); Polysubstance abuse; Dysphagia; Abnormal ECG; Essential hypertension; Fall; Numbness; Protein-calorie malnutrition, moderate ([place]); Weakness; Abdominal pain; Pancreatitis; Alcohol abuse; Alcohol-induced acute pancreatitis; Abnormal CT scan, esophagus; Abnormal weight loss; Dysphagia, pharyngoesophageal phase; 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    has No Known Allergies.    Medication List       This list is accurate as of: 08/07/15 12:17 PM.  Always use your most recent med list.               aspirin 81 MG tablet  Place 1 tablet (81 mg total) into feeding tube daily.     atorvastatin 40 MG tablet  Commonly known as:  LIPITOR  Place 1 tablet (40 mg total) into feeding tube daily at 6 PM.     carvedilol 12.5 MG tablet  Commonly known as:  COREG  Place 1 tablet (12.5 mg total) into feeding tube 2 (two) times daily with a meal.     feeding supplement Liqd  Take 1 Container by mouth 3 (three) times daily between meals.     feeding supplement (OSMOLITE 1.5 CAL) Liqd  Place 1,000 mLs into feeding tube continuous.     folic acid 1 MG tablet  Commonly known as:  FOLVITE  Place 1 tablet (1 mg total) into feeding tube daily.     furosemide 20 MG tablet  Commonly known as:  LASIX  Place 1 tablet (20 mg total) into feeding tube daily as needed for edema. Take 1 tablet as needed for fluid retention     gabapentin 300 MG capsule  Commonly known as:  NEURONTIN  Place 1 capsule (300 mg total) into feeding tube 3 (three) times daily.     HYDROcodone-acetaminophen 7.5-325 mg/15 ml solution  Commonly known as:  HYCET  Take 10 mLs by mouth 2 (two) times daily as needed for moderate pain.     lisinopril 20 MG tablet  Commonly known as:  PRINIVIL,ZESTRIL  Place 1 tablet (20 mg total) into feeding tube daily.     multivitamin with minerals Tabs tablet  Place 1 tablet into feeding tube daily.     nicotine 21 mg/24hr patch  Commonly known as:  NICODERM CQ - dosed in mg/24 hours  Place 1 patch (21 mg total) onto the skin daily.     nitroGLYCERIN 0.4 MG SL tablet  Commonly known as:  NITROSTAT  Place 1 tablet (0.4 mg total) under the tongue every 5 (five) minutes as needed for chest  pain.     nystatin 100000 UNIT/ML suspension  Commonly known as:  MYCOSTATIN  Take 5 mLs (500,000 Units total) by mouth 4 (four) times daily.     ondansetron 8 MG tablet  Commonly known as:  ZOFRAN  Take 1 tablet (8 mg total) by mouth every 8 (eight) hours as needed for nausea or vomiting.     oxyCODONE-acetaminophen 5-325 MG tablet  Commonly known as:  PERCOCET/ROXICET  Take 1-2 tablets by mouth every 6 (six) hours as needed for severe pain.     polyethylene glycol packet  Commonly known as:  MIRALAX / GLYCOLAX  Place 17 g into feeding tube daily as needed for mild constipation.     SENOKOT S PO  Take 2 tablets by mouth daily. Reported on 08/03/2015     [facility]  Apply 1 application topically daily. Reported on 08/07/2015     sorbitol 70 % solution  Take 30 mLs by mouth daily as needed.     sucralfate 1 g tablet  Commonly known as:  CARAFATE  Take 1 tablet (1 g total) by mouth 4 (four) times daily -  with meals and at bedtime. 5 min before meals for radiation induced esophagitis     triamcinolone cream 0.1 %  Commonly known as:  KENALOG  Apply 1 application topically 2 (two) times daily as needed (apply to rash). Reported on 08/07/2015         PHYSICAL EXAMINATION  Oncology Vitals 08/07/2015 08/03/2015  Height 175 cm 175 cm  Weight 67.767 kg 65.772 kg  Weight (lbs) 149 lbs 6 oz 145 lbs  BMI (kg/m2) 22.06 kg/m2 21.41 kg/m2  Temp 98.3 98.4  Pulse 102 98  Resp 18 -  SpO2 100 100  BSA (m2) 1.82 m2 1.79 m2   BP Readings from Last 2 Encounters:  08/07/15 129/84  08/03/15 125/83    Physical Exam  Constitutional: He is oriented to person, place, and time. Vital signs are normal. He appears malnourished and dehydrated. He appears unhealthy. He appears cachectic.  HENT:  Head: Normocephalic and atraumatic.  Mouth/Throat: Oropharynx is clear and moist.  Eyes: Conjunctivae and EOM are normal. Pupils are equal, round, and reactive to light. Right eye exhibits no discharge.  Left eye exhibits no discharge. No scleral icterus.  Neck: Normal range of motion. Neck supple. No JVD present. No tracheal deviation present. No thyromegaly present.  Cardiovascular: Normal rate, regular rhythm, normal heart sounds and intact distal pulses.   Pulmonary/Chest: Effort normal and breath sounds normal. No respiratory distress. He has no wheezes. He has no rales. He exhibits no tenderness.  Abdominal: He exhibits no distension and no mass. There is no tenderness. There is no rebound and no guarding.  Peg tube intact.  Musculoskeletal: Normal range of motion. He exhibits no edema or tenderness.  Lymphadenopathy:    He has no cervical adenopathy.  Neurological: He is alert and oriented to person, place, and time.  Walks with the assistance of a cane.  Skin: Skin is warm and dry. No rash noted. No erythema. No pallor.  Psychiatric: Affect normal.  Nursing note and vitals reviewed.   LABORATORY DATA:. Appointment on 08/07/2015  Component Date Value Ref Range Status  . WBC 08/07/2015 1.8* 4.0 - 10.3 10e3/uL Final  . NEUT# 08/07/2015 0.9* 1.5 - 6.5 10e3/uL Final  . HGB 08/07/2015 9.3* 13.0 - 17.1 g/dL Final  . HCT 08/07/2015 28.1* 38.4 - 49.9 % Final  . Platelets 08/07/2015 79* 140 - 400 10e3/uL Final  . MCV 08/07/2015 105.7* 79.3 - 98.0 fL Final  . MCH 08/07/2015 34.9* 27.2 - 33.4 pg Final  . MCHC 08/07/2015 33.0  32.0 - 36.0 g/dL Final  . RBC 08/07/2015 2.66* 4.20 - 5.82 10e6/uL Final  . RDW 08/07/2015 20.0* 11.0 - 14.6 % Final  . lymph# 08/07/2015 0.5* 0.9 - 3.3 10e3/uL Final  . MONO# 08/07/2015 0.4  0.1 - 0.9 10e3/uL Final  . Eosinophils Absolute 08/07/2015 0.0  0.0 - 0.5 10e3/uL Final  . Basophils Absolute 08/07/2015 0.0  0.0 - 0.1 10e3/uL Final  . NEUT% 08/07/2015 49.1  39.0 - 75.0 % Final  . LYMPH% 08/07/2015 26.6  14.0 - 49.0 % Final  . MONO% 08/07/2015 23.2* 0.0 - 14.0 % Final  . EOS% 08/07/2015 0.6  0.0 - 7.0 % Final  .  BASO% 08/07/2015 0.5  0.0 - 2.0 % Final  .  Sodium 08/07/2015 136  136 - 145 mEq/L Final  . Potassium 08/07/2015 4.0  3.5 - 5.1 mEq/L Final  . Chloride 08/07/2015 105  98 - 109 mEq/L Final  . CO2 08/07/2015 22  22 - 29 mEq/L Final  . Glucose 08/07/2015 105  70 - 140 mg/dl Final   Glucose reference range is for nonfasting patients. Fasting glucose reference range is 70- 100.  Marland Kitchen BUN 08/07/2015 4.6* 7.0 - 26.0 mg/dL Final  . Creatinine 08/07/2015 0.8  0.7 - 1.3 mg/dL Final  . Total Bilirubin 08/07/2015 0.78  0.20 - 1.20 mg/dL Final  . Alkaline Phosphatase 08/07/2015 144  40 - 150 U/L Final  . AST 08/07/2015 132* 5 - 34 U/L Final  . ALT 08/07/2015 99* 0 - 55 U/L Final  . Total Protein 08/07/2015 7.6  6.4 - 8.3 g/dL Final  . Albumin 08/07/2015 2.9* 3.5 - 5.0 g/dL Final  . Calcium 08/07/2015 9.2  8.4 - 10.4 mg/dL Final  . Anion Gap 08/07/2015 8  3 - 11 mEq/L Final  . EGFR 08/07/2015 >90  >90 ml/min/1.73 m2 Final   eGFR is calculated using the CKD-EPI Creatinine Equation (2009)  . Hold Tube, Blood Bank 08/07/2015 Blood Bank Order Cancelled   Final     RADIOGRAPHIC STUDIES: Dg Abd 1 View  08/03/2015  CLINICAL DATA:  65 year old male with constipation for 1 week. Vomiting following feeding through PEG tube. Esophageal cancer. EXAM: ABDOMEN - 1 VIEW COMPARISON:  01/31/2011 CT FINDINGS: A PEG tube is noted. Moderate amount of stool in the colon is present. Nondistended gas-filled loops of small bowel are identified. Right renal calcifications are unchanged. No acute bony abnormalities noted. IMPRESSION: Nonspecific nonobstructive bowel gas pattern. Moderate amount of colonic stool. Electronically Signed   By: Margarette Canada M.D.   On: 08/03/2015 17:08    ASSESSMENT/PLAN:    Cancer of thoracic esophagus [facility]) Patient last received carboplatin/Taxol chemotherapy regimen on 07/24/2015.  He just completed his last radiation treatment 08/02/15.   Blood counts obtained today reveal a WBC 1.8, ANC 0.9, hemoglobin 9.3, platelet count 79.  Patient  was reminded of all neutropenia guidelines once again.  Exam today revealed the patient appeared less fatigued and much stronger than last week.  On exam.  Patient's anterior neck appears dry and slightly red; but much improved.  Patient was given some Aquaphor samples to try to his neck as well.  The plan is for the patient to return in approximately 3-4 weeks with labs and a follow-up visit with Dr. Benay Spice.  Patient is already scheduled to see Dr. Lisbeth Renshaw for follow-up visit on 09/07/2015; and patient would like to see Dr. Benay Spice at that same time if possible due to transportation issues.  Will schedule patient for labs and follow-up visit with Dr. Benay Spice on 09/07/2015.      Constipation Patient states that he has been taking Ex-Lax dose as directed; and his bowels are moving more than they were last week.  He feels less constipated today.  Advised patient to continue taking the laxatives as directed.  Nausea with vomiting Patient states that he has had minimal to no nausea or vomiting within the past few days.  He states that he has been able to eat soft foods such as fish/salmon and meatloaf over the past weekend.  He has also been eating mashed potatoes.  He has plans to follow-up with his home health nurse either today  or tomorrow in regards to restarting his tube feeds.  He states that he will attempt to run his tube feeds at a lower rate to see if that helps.  Also, patient was advised to sleep with the head of his bed up somewhat to aid in digestion.   Patient stated understanding of all instructions; and was in agreement with this plan of care. The patient knows to call the clinic with any problems, questions or concerns.   Review/collaboration with Dr. Benay Spice regarding all aspects of patient's visit today.   Total time spent with patient was 25 minutes;  with greater than 75 percent of that time spent in face to face counseling regarding patient's symptoms,  and coordination  of care and follow up.  Disclaimer:This dictation was prepared with Dragon/digital dictation along with [employer]. Any transcriptional errors that result from this process are unintentional.  Drue Second, NP 08/07/2015

## 2015-08-07 NOTE — Assessment & Plan Note (Addendum)
Patient last received carboplatin/Taxol chemotherapy regimen on 07/24/2015.  He just completed his last radiation treatment 08/02/15.   Blood counts obtained today reveal a WBC 1.8, ANC 0.9, hemoglobin 9.3, platelet count 79.  Patient was reminded of all neutropenia guidelines once again.  Exam today revealed the patient appeared less fatigued and much stronger than last week.  On exam.  Patient's anterior neck appears dry and slightly red; but much improved.  Patient was given some Aquaphor samples to try to his neck as well.  The plan is for the patient to return in approximately 3-4 weeks with labs and a follow-up visit with Dr. Benay Spice.  Patient is already scheduled to see Dr. Lisbeth Renshaw for follow-up visit on 09/07/2015; and patient would like to see Dr. Benay Spice at that same time if possible due to transportation issues.  Will schedule patient for labs and follow-up visit with Dr. Benay Spice on 09/07/2015.

## 2015-08-07 NOTE — Assessment & Plan Note (Signed)
Patient states that he has had minimal to no nausea or vomiting within the past few days.  He states that he has been able to eat soft foods such as fish/salmon and meatloaf over the past weekend.  He has also been eating mashed potatoes.  He has plans to follow-up with his home health nurse either today or tomorrow in regards to restarting his tube feeds.  He states that he will attempt to run his tube feeds at a lower rate to see if that helps.  Also, patient was advised to sleep with the head of his bed up somewhat to aid in digestion.

## 2015-08-07 NOTE — Assessment & Plan Note (Addendum)
Patient states that he has been taking Ex-Lax dose as directed; and his bowels are moving more than they were last week.  He feels less constipated today.  Advised patient to continue taking the laxatives as directed.

## 2015-08-07 NOTE — Telephone Encounter (Signed)
per pof to sch pt appt-no openings-sent [facility] staff message to advise on when to sch pt

## 2015-08-10 ENCOUNTER — Ambulatory Visit (INDEPENDENT_AMBULATORY_CARE_PROVIDER_SITE_OTHER): Payer: Medicare HMO | Admitting: Neurology

## 2015-08-10 ENCOUNTER — Other Ambulatory Visit (INDEPENDENT_AMBULATORY_CARE_PROVIDER_SITE_OTHER): Payer: Medicare HMO

## 2015-08-10 ENCOUNTER — Encounter: Payer: Self-pay | Admitting: Neurology

## 2015-08-10 VITALS — BP 134/82 | HR 96 | Ht 69.0 in | Wt 148.0 lb

## 2015-08-10 DIAGNOSIS — G8220 Paraplegia, unspecified: Secondary | ICD-10-CM | POA: Diagnosis not present

## 2015-08-10 DIAGNOSIS — G621 Alcoholic polyneuropathy: Secondary | ICD-10-CM

## 2015-08-10 DIAGNOSIS — G620 Drug-induced polyneuropathy: Secondary | ICD-10-CM | POA: Diagnosis not present

## 2015-08-10 DIAGNOSIS — T451X5A Adverse effect of antineoplastic and immunosuppressive drugs, initial encounter: Secondary | ICD-10-CM

## 2015-08-10 DIAGNOSIS — Z79899 Other long term (current) drug therapy: Secondary | ICD-10-CM

## 2015-08-10 LAB — VITAMIN B12: Vitamin B-12: 682 pg/mL (ref 211–911)

## 2015-08-10 LAB — FOLATE: Folate: 13.3 ng/mL (ref >5.9)

## 2015-08-10 MED ORDER — GABAPENTIN 600 MG PO TABS: ORAL_TABLET | ORAL | Status: DC

## 2015-08-10 NOTE — Patient Instructions (Addendum)
1.  Start gabapentin 600mg  tablets as follows:   Morning       Afternoon        Evening  Week 1 0.5 tab  0.5 tab  0.5          Continue  1 tab          1 tab            1 tab         Call with update in 1 month, to determine further increase in medication.  If you develop increased sleepiness, stay at the lower dose.           2.  Start home physical therapy for leg strengthening and balance therapy  3.  Check blood work.  We will call you with the results  Return to clinic in 2-3 month

## 2015-08-10 NOTE — Progress Notes (Signed)
[facility] Clinic Note - Initial Visit   Date: 08/10/2015  Patrick [name] MRN: [id number] DOB: 1951-01-22   Dear Dr. Irving Burton:  Thank you for your kind referral of Patrick [name] for consultation of generalized paresthesias and weakness. Although his history is well known to you, please allow [place] to reiterate it for the purpose of our medical record. The patient was accompanied to the clinic by self.    History of Present Illness: Patrick [name] is a 65 y.o. right-handed African American male with CAD s/p PCI, current tobacco use, hepatitis C, alcoholic cirrhosis, polysubstance abuse, thrombocytopenia, and newly diagnosed squamous cell carcinoma of the esophagus presenting for evaluation of bilateral leg pain and weakness.    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  Imaging of the thoracic spine noted widespread nonspecific decreased bone marrow signal. No spinal stenosis or spinal cord signal abnormalities. A 4.5 cm segment of severe circumferential esophageal wall thickening was noted at the level of the thyroid.  He has completed his first course of radiation and chemotherapy with taxol and carboplatin last week.   Prior to the discovery of his malignancy, patient had been complaining of weight loss and weakness and numbness of the legs.  He reports having painful tingling, sharp shooting pain of the feet for at least a year.  Symptoms started in the feet and gradually involved his lower legs.  Numbness extends all the way to his knees bilaterally.  He also complains of pain involving the finger tips which was intermittent and mild for a long time.   Since early December 2016, he has noticed that the pain and numbness has moved up to his mid-thigh and there is constant pain of the hands. He  reports problems with imbalance and has been using a cane for the past year.  Climbing stairs is challenging and often he has to call someone to help him bring groceries in his home because of gait difficulty. He had one fall in 2016.  He started drinking since the age of 22 and was drinking heavily for at least 30 years (beer, wine, liquor).  He quit drinking beer several years ago and started drinking about a pint of liquor daily.  He reduced his intake in 2015 and now a bottle of wine will last a few days.  He drinks more when his pain is worse. He used to use heroin and cocaine previously.  He takes oxycodone which alleviates some of his pain and lets him sleep.  Gabapentin 300mg  TID was written a few months ago, but he denies taking it.  He now had a PEG placed and is still struggling to get adjusted to it.  He has a home nurse and home PT arranged, but has not started any therapy yet.    Out-side paper records, electronic medical record, and images have been reviewed where available and summarized as:  MRI thoracic spine wwo contrast 04/26/2015: 1. 4 cm segment of abnormal esophageal wall thickening at the thoracic inlet, suspicious for esophageal carcinoma. Possible enlarged 14 mm lymph node in the right tracheoesophageal groove. Recommend endoscopy. 2. Abnormal bone marrow signal diffusely, without destructive lesion or marrow edema in the thoracic spine. Query anemia causing red marrow reactivation. Otherwise metastatic disease to bone cannot be excluded. 3. No thoracic spinal stenosis, cord compression, or  thoracic spinal cord abnormality identified to explain the sensory changes  CT head 04/26/2015: 1. No acute intracranial process. 2. Generalized age-related cerebral atrophy with chronic microvascular ischemic disease.  Lab Results  Component Value Date   TSH 1.498 04/26/2015   Lab Results  Component Value Date   [id number] 04/26/2015   Lab Results  Component Value Date    HGBA1C 5.4 03/02/2015     Past Medical History  Diagnosis Date  . MI (myocardial infarction) ([place])     x3, jan, aug, nov 2011; treated at [facility] in [place]  . Sexual dysfunction   . HTN (hypertension)   . Hyperlipidemia   . Chronic chest pain   . CAD (coronary artery disease)   . Thrombocytopenia ([place])   . SSBE (short-segment Barrett's esophagus) 11/11/11  . Colon adenomas 11/11/11    x 5, one rectal tv adenoma  . GERD (gastroesophageal reflux disease)   . Chronic hepatitis C with cirrhosis ([place]) 01/02/2012  . Barrett's esophagus 11/17/2011    Short-segment dx 11/2011   . Arthritis     knees  . Cataract     bil removed  . Anemia   . Alcoholism ([place]) 01/02/2012    cocaine abuse, herion abuse in past    Past Surgical History  Procedure Laterality Date  . Hernia repair      very young  . Balloon angioplasty, artery  03/2010  . Colonoscopy  11/11/11    Dr. Silvano Rusk  . Esophagogastroduodenoscopy  11/11/11    Dr. Silvano Rusk  . Cardiac catheterization  01/31/2011  . Coronary angioplasty    . Cataract extraction w/phaco  08/18/2012    Procedure: CATARACT EXTRACTION PHACO AND INTRAOCULAR LENS PLACEMENT (IOC);  Surgeon: Adonis Brook, MD;  Location: [facility];  Service: Ophthalmology;  Laterality: Right;  . Cataract extraction w/phaco Left 09/22/2012    Procedure: CATARACT EXTRACTION PHACO AND INTRAOCULAR LENS PLACEMENT (IOC);  Surgeon: Adonis Brook, MD;  Location: [facility];  Service: Ophthalmology;  Laterality: Left;  . Upper gastrointestinal endoscopy    . Esophagogastroduodenoscopy (egd) with propofol N/A 04/27/2015    Procedure: ESOPHAGOGASTRODUODENOSCOPY (EGD) WITH PROPOFOL;  Surgeon: Jerene Bears, MD;  Location: [facility] ENDOSCOPY;  Service: Endoscopy;  Laterality: N/A;  . Peg placement N/A 06/26/2015    Procedure: PERCUTANEOUS ENDOSCOPIC GASTROSTOMY (PEG) PLACEMENT;  Surgeon: Gatha Mayer, MD;  Location: [facility] ENDOSCOPY;  Service: Endoscopy;  Laterality: N/A;     Medications:  Outpatient  Encounter Prescriptions as of 08/10/2015  Medication Sig Note  . aspirin 81 MG tablet Place 1 tablet (81 mg total) into feeding tube daily.   . nicotine (NICODERM CQ - DOSED IN MG/24 HOURS) 21 mg/24hr patch Place 1 patch (21 mg total) onto the skin daily.   . Nutritional Supplements (FEEDING SUPPLEMENT, OSMOLITE 1.5 CAL,) LIQD Place 1,000 mLs into feeding tube continuous.   Marland Kitchen oxyCODONE-acetaminophen (PERCOCET/ROXICET) 5-325 MG tablet Take 1-2 tablets by mouth every 6 (six) hours as needed for severe pain.   . polyethylene glycol (MIRALAX / GLYCOLAX) packet Place 17 g into feeding tube daily as needed for mild constipation.   . sorbitol 70 % solution Take 30 mLs by mouth daily as needed.   . triamcinolone cream (KENALOG) 0.1 % Apply 1 application topically 2 (two) times daily as needed (apply to rash). Reported on 08/07/2015 04/26/2015: .   Marland Kitchen Wound Dressings (SONAFINE) Apply 1 application topically daily. Reported on 08/07/2015   . atorvastatin (LIPITOR) 40 MG tablet Place 1 tablet (40 mg  total) into feeding tube daily at 6 PM. (Patient not taking: Reported on 08/10/2015)   . carvedilol (COREG) 12.5 MG tablet Place 1 tablet (12.5 mg total) into feeding tube 2 (two) times daily with a meal. (Patient not taking: Reported on 08/10/2015)   . feeding supplement (BOOST / RESOURCE BREEZE) LIQD Take 1 Container by mouth 3 (three) times daily between meals. (Patient not taking: Reported on 08/10/2015)   . folic acid (FOLVITE) 1 MG tablet Place 1 tablet (1 mg total) into feeding tube daily. (Patient not taking: Reported on 08/10/2015)   . furosemide (LASIX) 20 MG tablet Place 1 tablet (20 mg total) into feeding tube daily as needed for edema. Take 1 tablet as needed for fluid retention (Patient not taking: Reported on 08/10/2015)   . gabapentin (NEURONTIN) 600 MG tablet Take half tablet three times daily via PEG for one week, then increase to 1 tablet three times daily.   Marland Kitchen HYDROcodone-acetaminophen (HYCET) 7.5-325 mg/15 ml  solution Take 10 mLs by mouth 2 (two) times daily as needed for moderate pain. (Patient not taking: Reported on 08/07/2015)   . lisinopril (PRINIVIL,ZESTRIL) 20 MG tablet Place 1 tablet (20 mg total) into feeding tube daily. (Patient not taking: Reported on 08/10/2015)   . Multiple Vitamin (MULTIVITAMIN WITH MINERALS) TABS tablet Place 1 tablet into feeding tube daily. (Patient not taking: Reported on 08/10/2015)   . nitroGLYCERIN (NITROSTAT) 0.4 MG SL tablet Place 1 tablet (0.4 mg total) under the tongue every 5 (five) minutes as needed for chest pain. (Patient not taking: Reported on 08/07/2015)   . nystatin (MYCOSTATIN) 100000 UNIT/ML suspension Take 5 mLs (500,000 Units total) by mouth 4 (four) times daily. (Patient not taking: Reported on 08/10/2015)   . ondansetron (ZOFRAN) 8 MG tablet Take 1 tablet (8 mg total) by mouth every 8 (eight) hours as needed for nausea or vomiting. (Patient not taking: Reported on 08/10/2015)   . Sennosides-Docusate Sodium (SENOKOT S PO) Take 2 tablets by mouth daily. Reported on 08/10/2015   . sucralfate (CARAFATE) 1 g tablet Take 1 tablet (1 g total) by mouth 4 (four) times daily -  with meals and at bedtime. 5 min before meals for radiation induced esophagitis (Patient not taking: Reported on 08/07/2015)   . [DISCONTINUED] gabapentin (NEURONTIN) 300 MG capsule Place 1 capsule (300 mg total) into feeding tube 3 (three) times daily. (Patient not taking: Reported on 08/07/2015)    No facility-administered encounter medications on file as of 08/10/2015.     Allergies: No Known Allergies  Family History: Family History  Problem Relation Age of Onset  . Cancer      mother's side/fathers side, 2 uncles  . Heart attack Maternal Grandfather   . Diabetes Sister   . Stroke Sister   . Colon cancer Neg Hx   . Esophageal cancer Neg Hx   . Rectal cancer Neg Hx   . Stomach cancer Neg Hx   . Healthy Daughter   . Healthy Son     Social History: Social History  Substance Use Topics    . Smoking status: Current Some Day Smoker -- 0.10 packs/day for 45 years    Types: Cigarettes  . Smokeless tobacco: Never Used     Comment: 1-2 cig/day  . Alcohol Use: Yes     Comment: Pt reports drinking 2 bottles of wine tonight. "I drink a 5th of wine daily".   Social History   Social History Narrative   Single, lives alone   Poor social  support-depends on others for transportation   Fall Risk-ambulates w/cane. Has rolling walker at home   Chronic depression    Review of Systems:  CONSTITUTIONAL: No fevers, chills, night sweats, +weight loss.   EYES: No visual changes or eye pain ENT: No hearing changes.  No history of nose bleeds.   RESPIRATORY: +cough, wheezing and shortness of breath.   CARDIOVASCULAR: +for chest pain, and palpitations.   GI: Negative for abdominal discomfort, blood in stools or black stools.  No recent change in bowel habits.   GU:  No history of incontinence.   MUSCLOSKELETAL: +history of joint pain or swelling.  No myalgias.   SKIN: Negative for lesions, rash, and itching.   HEMATOLOGY/ONCOLOGY: Negative for prolonged bleeding, bruising easily, and swollen nodes.  +history of cancer.   ENDOCRINE: Negative for cold or heat intolerance, polydipsia or goiter.   PSYCH:  +depression or anxiety symptoms.   NEURO: As Above.   Vital Signs:  BP 134/82 mmHg  Pulse 96  Ht 5\' 9"  (1.753 m)  Wt 148 lb (67.132 kg)  BMI 21.85 kg/m2  General Medical Exam:   General:  Thin-appearing, comfortable.   Eyes/ENT: see cranial nerve examination.   Respiratory:  Clear to auscultation, good air entry bilaterally.   Cardiac:  Regular rate and rhythm, no murmur.   Extremities:  No deformities, edema, or skin discoloration.  Skin:  No rashes or lesions. Dry extremities.   Neurological Exam: MENTAL STATUS including orientation to time, place, person, recent and remote memory, attention span and concentration, language, and fund of knowledge is normal.  Speech is not  dysarthric.  CRANIAL NERVES: II:  No visual field defects.  Unremarkable fundi.   III-IV-VI: Pupils equal round and reactive to light.  Normal conjugate, extra-ocular eye movements in all directions of gaze.  No nystagmus.  No ptosis.   V:  Normal facial sensation.    VII:  Normal facial symmetry and movements. VIII:  Normal hearing and vestibular function.   IX-X:  Normal palatal movement.   XI:  Normal shoulder shrug and head rotation.   XII:  Normal tongue strength and range of motion, no deviation or fasciculation.  MOTOR:  No atrophy, fasciculations or abnormal movements.  No pronator drift.  Tone is normal.    Right Upper Extremity:    Left Upper Extremity:    Deltoid  5/5   Deltoid  5/5   Biceps  5/5   Biceps  5/5   Triceps  5/5   Triceps  5/5   Wrist extensors  5/5   Wrist extensors  5/5   Wrist flexors  5/5   Wrist flexors  5/5   Finger extensors  5/5   Finger extensors  5/5   Finger flexors  5/5   Finger flexors  5/5   Dorsal interossei  5/5   Dorsal interossei  5/5   Abductor pollicis  5/5   Abductor pollicis  5/5   Tone (Ashworth scale)  0  Tone (Ashworth scale)  0   Right Lower Extremity:    Left Lower Extremity:    Hip flexors  4/5   Hip flexors  4/5   Hip extensors  5/5   Hip extensors  5/5   Knee flexors  5/5   Knee flexors  5/5   Knee extensors  5/5   Knee extensors  5/5   Dorsiflexors  5-/5   Dorsiflexors  5-/5   Plantarflexors  5-/5   Plantarflexors  5-/5   Toe  extensors  3/5   Toe extensors  5/5   Toe flexors  5/5   Toe flexors  5/5   Tone (Ashworth scale)  0  Tone (Ashworth scale)  0   MSRs:  Right                                                                 Left brachioradialis 2+  brachioradialis 2+  biceps 2+  biceps 2+  triceps 2+  triceps 2+  patellar tr  patellar tr  ankle jerk 0  ankle jerk 0  Hoffman no  Hoffman no  plantar response down  plantar response down   SENSORY:  Reduced sensation to all modalities distal to knees bilaterally  following a gradient pattern.    Romberg's sign is present.   COORDINATION/GAIT: Normal finger-to- nose-finger and heel-to-shin.  Slowed finger tapping bilaterally.  He is unable to rise from a chair without using arms.  Gait slow, mildly ataxic and slightly wide-based.   IMPRESSION: Patrick [name] is a 65 year-old gentleman referred for evaluation of bilateral leg weakness and paresthesias.  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  His recent worsening of paresthesias into his thighs is most likely chemotherapy-induced.  He was made aware of the neurotoxic effects of alcohol and the importance of cessation in order to prevent progression. 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 I discussed that goal of management is to control his pain.  He was briefly started on gabapentin but admits to being noncompliant with it, so will restart this.    I will also check for other treatable types of neuropathy, including nutrient deficiency.  NCS/EMG was declined by patient.  Low suspicion for paraneoplastic neuropathy, but if symptoms continue to progress in an atypical manner, will need to check antibodies.  His generalized weakness is most likely multifactorial (nutritional, cancer-related, deconditioning, as well as other underlying medical co-morbidities).  PLAN/RECOMMENDATIONS:  1.  Check folate, vitamin B1, MMA, copper, vitamin B12 2.  Start gabapentin 300mg  TID and titrate to 600mg  TID.  Side effects discussed. 3.  Start home PT for gait training and leg strengthening 4.  Call with update in 1 month  Return to clinic in 2-3 months.   The duration of this appointment visit was 60 minutes of face-to-face time with the patient.  Greater than 50% of this time was spent in counseling, explanation of diagnosis, planning of further management, and  coordination of care.   Thank you for allowing me to participate in patient's care.  If I can answer any additional questions, I would be pleased to do so.    Sincerely,    Amelia Burgard K. Posey Pronto, DO

## 2015-08-13 LAB — METHYLMALONIC ACID, SERUM: METHYLMALONIC ACID, QUANT: 108 nmol/L (ref 87–318)

## 2015-08-14 LAB — COPPER, SERUM: Copper: 155 ug/dL (ref 70–175)

## 2015-08-15 LAB — VITAMIN B1: Vitamin B1 (Thiamine): 7 nmol/L — ABNORMAL LOW (ref 8–30)

## 2015-08-16 ENCOUNTER — Telehealth: Payer: Self-pay | Admitting: *Deleted

## 2015-08-16 DIAGNOSIS — K5900 Constipation, unspecified: Secondary | ICD-10-CM

## 2015-08-16 DIAGNOSIS — C153 Malignant neoplasm of upper third of esophagus: Secondary | ICD-10-CM

## 2015-08-16 MED ORDER — SORBITOL 70 % PO SOLN: 30.0000 mL | Freq: Every day | ORAL | Status: DC | PRN

## 2015-08-16 NOTE — Telephone Encounter (Signed)
"  Request refill on sorbitol.  I took the last dose yesterday and ned this as it works better than [employer] and prune juice."

## 2015-08-21 ENCOUNTER — Telehealth: Payer: Self-pay | Admitting: *Deleted

## 2015-08-21 NOTE — Telephone Encounter (Signed)
Received call from [facility] requesting OK from Dr. Benay Spice to discontinue tube feeding; treatment completed-- reports pt is eating well and only does water flush with feeding tube; weight up to 153 lbs.  Per Dr. Benay Spice; notified Maudie Mercury that OK to discontinue tube feeding. Kim verbalized understanding.

## 2015-08-23 ENCOUNTER — Telehealth: Payer: Self-pay | Admitting: *Deleted

## 2015-08-23 NOTE — Telephone Encounter (Signed)
"   Drue Dun RN with [facility] calling for order to recertify Mr. Patrick [name].  He needs continued education and monitoring due to incomprehension.  Secondly he is willing and has started Physical Therapy.  Could we have order for PT for strengthening?  Return number [phone]."

## 2015-08-23 NOTE — Telephone Encounter (Signed)
Ok to order 

## 2015-08-23 NOTE — Telephone Encounter (Signed)
Called [facility] with these orders and spoke with RN.  Orders given at this time.

## 2015-08-27 ENCOUNTER — Other Ambulatory Visit: Payer: Self-pay | Admitting: *Deleted

## 2015-08-27 ENCOUNTER — Telehealth: Payer: Self-pay | Admitting: Neurology

## 2015-08-27 MED ORDER — NORTRIPTYLINE HCL 10 MG PO CAPS: 10.0000 mg | ORAL_CAPSULE | Freq: Every day | ORAL | Status: DC

## 2015-08-27 NOTE — Telephone Encounter (Signed)
Pt called again in regards to his medication/Dawn CB# [phone]

## 2015-08-27 NOTE — Telephone Encounter (Signed)
Patient is having swelling in legs, ankles and tops of feet since he has been on gabapentin.  Please advise.

## 2015-08-27 NOTE — Telephone Encounter (Signed)
Patient given instructions and agreed to start the nortriptyline.  Rx sent to [facility]

## 2015-08-27 NOTE — Telephone Encounter (Signed)
OK to stop gabapentin.  If interested, we can switch him to nortriptyline 10mg  at bedtime - side effects include lightheadedness, sleepiness, and dry mouth but usually this occurs at high dosages.  If he agrees, please send rx.

## 2015-08-27 NOTE — Telephone Encounter (Signed)
VM-PT left message he needs a cal back in regards to his medication/Dawn CB# [phone]

## 2015-08-28 ENCOUNTER — Telehealth: Payer: Self-pay | Admitting: Neurology

## 2015-08-28 ENCOUNTER — Telehealth: Payer: Self-pay | Admitting: *Deleted

## 2015-08-28 NOTE — Telephone Encounter (Signed)
Called patient back and he said that he is concerned about the nortriptyline since it does not mention anything about being used for pain.  Informed him that we do use this for pain as well.  He agreed to try it.

## 2015-08-28 NOTE — Telephone Encounter (Signed)
Pt has some questions about the medication he just picked up from the drug store please call [phone]

## 2015-08-28 NOTE — Telephone Encounter (Signed)
"  Patrick [name] PT with [facility] calling in reference to pain medicine Dr. Tammi Klippel ordered for Oxycodone 5-325 is not helping nearly as much as the 7.5 325 mg he was taking.  This is just not doing what the other medicine was, hard time sleeping.  No longer taking the nerve pill Gabapentin because it wasn't helping either.  He's left with just the 5-325 mg tablet and would like to go back on the 7.5 mg.  Please call him at [phone]."    Voicemail routed to RT ext. [phone].

## 2015-08-29 ENCOUNTER — Telehealth: Payer: Self-pay | Admitting: *Deleted

## 2015-08-29 ENCOUNTER — Other Ambulatory Visit: Payer: Self-pay | Admitting: Radiation Oncology

## 2015-08-29 MED ORDER — HYDROCODONE-ACETAMINOPHEN 7.5-325 MG/15ML PO SOLN: 10.0000 mL | Freq: Two times a day (BID) | ORAL | Status: DC | PRN

## 2015-08-29 MED ORDER — OXYCODONE-ACETAMINOPHEN 7.5-325 MG PO TABS: 1.0000 | ORAL_TABLET | ORAL | Status: DC | PRN

## 2015-08-29 NOTE — Telephone Encounter (Signed)
Called patient after speaking with Dr. Lisbeth Renshaw,.  He has refilled the , pain medication ( hycet solution)and can be picked up today at nursing, patient thanked this RN for the call back will come later this am to pick up 9:58 AM

## 2015-08-29 NOTE — Telephone Encounter (Signed)
all Documentation      Doreen Beam, RN at 08/29/2015 9:38 AM     Status: Signed       Expand All Collapse All   Called patient after speaking with Dr. Lisbeth Renshaw,. He has refilled the , pain medication ( hycet solution)and can be picked up today at nursing, patient thanked this RN for the call back will come later this am to pick up 9:58 AM

## 2015-08-30 ENCOUNTER — Telehealth: Payer: Self-pay | Admitting: Oncology

## 2015-08-30 ENCOUNTER — Other Ambulatory Visit: Payer: Self-pay | Admitting: *Deleted

## 2015-08-30 DIAGNOSIS — C153 Malignant neoplasm of upper third of esophagus: Secondary | ICD-10-CM

## 2015-08-30 DIAGNOSIS — K5900 Constipation, unspecified: Secondary | ICD-10-CM

## 2015-08-30 MED ORDER — SORBITOL 70 % PO SOLN: 30.0000 mL | Freq: Every day | ORAL | Status: DC | PRN

## 2015-08-30 NOTE — Telephone Encounter (Signed)
Spoke with patient re appointments for 2/13 lab/LT - GI Clinic - per navigator ok w/LT.

## 2015-09-05 ENCOUNTER — Telehealth: Payer: Self-pay | Admitting: Oncology

## 2015-09-05 NOTE — Telephone Encounter (Signed)
LISA WITH [facility] REQUESTED MEDICAL NECC. FORM FAX [phone] RELEASE ID)

## 2015-09-06 ENCOUNTER — Telehealth: Payer: Self-pay | Admitting: *Deleted

## 2015-09-06 NOTE — Telephone Encounter (Signed)
Message from [place] with [facility] reporting pt is tolerating PO meals with snacks in between. Has not used Gtube for feeding in 3 weeks. Requests order to DC pump. Reviewed with Dr. Benay Spice: Verbal order given and read back.

## 2015-09-07 ENCOUNTER — Ambulatory Visit (HOSPITAL_BASED_OUTPATIENT_CLINIC_OR_DEPARTMENT_OTHER): Payer: Medicare HMO | Admitting: Nurse Practitioner

## 2015-09-07 ENCOUNTER — Other Ambulatory Visit (HOSPITAL_BASED_OUTPATIENT_CLINIC_OR_DEPARTMENT_OTHER): Payer: Medicare HMO

## 2015-09-07 ENCOUNTER — Telehealth: Payer: Self-pay | Admitting: Oncology

## 2015-09-07 ENCOUNTER — Ambulatory Visit
Admission: RE | Admit: 2015-09-07 | Discharge: 2015-09-07 | Disposition: A | Discharge status: Home / Self Care | Payer: Medicare HMO | Source: Ambulatory Visit | Attending: Radiation Oncology | Admitting: Radiation Oncology

## 2015-09-07 VITALS — BP 142/92 | HR 78 | Temp 97.7°F | Resp 18 | Ht 69.0 in | Wt 165.1 lb

## 2015-09-07 DIAGNOSIS — D696 Thrombocytopenia, unspecified: Secondary | ICD-10-CM | POA: Diagnosis not present

## 2015-09-07 DIAGNOSIS — C159 Malignant neoplasm of esophagus, unspecified: Secondary | ICD-10-CM

## 2015-09-07 DIAGNOSIS — C154 Malignant neoplasm of middle third of esophagus: Secondary | ICD-10-CM

## 2015-09-07 DIAGNOSIS — D701 Agranulocytosis secondary to cancer chemotherapy: Secondary | ICD-10-CM | POA: Diagnosis not present

## 2015-09-07 LAB — CBC WITH DIFFERENTIAL/PLATELET
BASO%: 0.9 % (ref 0.0–2.0)
Basophils Absolute: 0 10*3/uL (ref 0.0–0.1)
EOS%: 3.6 % (ref 0.0–7.0)
Eosinophils Absolute: 0.1 10*3/uL (ref 0.0–0.5)
HCT: 32.8 % — ABNORMAL LOW (ref 38.4–49.9)
HGB: 10.7 g/dL — ABNORMAL LOW (ref 13.0–17.1)
LYMPH%: 30 % (ref 14.0–49.0)
MCH: 36.2 pg — ABNORMAL HIGH (ref 27.2–33.4)
MCHC: 32.7 g/dL (ref 32.0–36.0)
MCV: 110.9 fL — AB (ref 79.3–98.0)
MONO#: 0.6 10*3/uL (ref 0.1–0.9)
MONO%: 20.1 % — AB (ref 0.0–14.0)
NEUT%: 45.4 % (ref 39.0–75.0)
NEUTROS ABS: 1.3 10*3/uL — AB (ref 1.5–6.5)
Platelets: 60 10*3/uL — ABNORMAL LOW (ref 140–400)
RBC: 2.96 10*6/uL — AB (ref 4.20–5.82)
RDW: 19.8 % — ABNORMAL HIGH (ref 11.0–14.6)
WBC: 2.8 10*3/uL — AB (ref 4.0–10.3)
lymph#: 0.8 10*3/uL — ABNORMAL LOW (ref 0.9–3.3)

## 2015-09-07 LAB — COMPREHENSIVE METABOLIC PANEL
ALT: 17 U/L (ref 0–55)
ANION GAP: 7 meq/L (ref 3–11)
AST: 45 U/L — ABNORMAL HIGH (ref 5–34)
Albumin: 2.8 g/dL — ABNORMAL LOW (ref 3.5–5.0)
Alkaline Phosphatase: 136 U/L (ref 40–150)
BILIRUBIN TOTAL: 0.49 mg/dL (ref 0.20–1.20)
BUN: 6.2 mg/dL — ABNORMAL LOW (ref 7.0–26.0)
CHLORIDE: 106 meq/L (ref 98–109)
CO2: 26 meq/L (ref 22–29)
Calcium: 9 mg/dL (ref 8.4–10.4)
Creatinine: 0.8 mg/dL (ref 0.7–1.3)
GLUCOSE: 96 mg/dL (ref 70–140)
Potassium: 4.7 mEq/L (ref 3.5–5.1)
SODIUM: 139 meq/L (ref 136–145)
TOTAL PROTEIN: 7.4 g/dL (ref 6.4–8.3)

## 2015-09-07 NOTE — Telephone Encounter (Signed)
per pof to sch pt appt-cld [facility] and per Colletta Maryland in [place] but scheduled appt while on phone-gave pt copy of avs

## 2015-09-07 NOTE — Progress Notes (Signed)
Follow up s/p rad txs   06/11/15- 08/03/15 , just saw Hart Robinsons and Dr. Benay Spice today, referral top Dr. Carlean Purl for f/u endoscopy, not using peg tube, drinks and swallows without difficulty,eating solid  Soft foods, mostly fish, , was stopped on Gabapentin,  Drinking ensure plus,  Percocet pills helping his pain better, vitals taken in med onc  And weight 165.lb 142/92,P=78,RR=18 T=97.7 There were no vitals taken for this visit.  Wt Readings from Last 3 Encounters:  09/07/15 165 lb 1.6 oz (74.889 kg)  08/10/15 148 lb (67.132 kg)  08/07/15 149 lb 6.4 oz (67.767 kg)

## 2015-09-07 NOTE — Progress Notes (Addendum)
  [facility] OFFICE PROGRESS NOTE   Diagnosis:  Esophagus cancer  INTERVAL HISTORY:   Mr. Stalcup returns as scheduled. He feels like he is "doing well". He has a good appetite. He is gaining weight. He is tolerating a soft diet without difficulty. He is no longer utilizing the feeding tube. He flushes it regularly. No odynophagia. No nausea or vomiting. Bowels are moving. He reports he was diagnosed with "alcoholic peripheral neuropathy" and is currently taking nortriptyline.  Objective:  Vital signs in last 24 hours:  Blood pressure 142/92, pulse 78, temperature 97.7 F (36.5 C), resp. rate 18, height 5\' 9"  (1.753 m), weight 165 lb 1.6 oz (74.889 kg), SpO2 100 %.    HEENT: No thrush or ulcers. Lymphatics: No palpable cervical, supra clavicular or axillary lymph nodes. Resp: Lungs clear bilaterally. Cardio: Regular rate and rhythm. GI: Abdomen soft and nontender. No hepatomegaly. Feeding tube left abdomen. Vascular: Trace lower leg edema bilaterally.  Lab Results:  Lab Results  Component Value Date   WBC 2.8* 09/07/2015   HGB 10.7* 09/07/2015   HCT 32.8* 09/07/2015   MCV 110.9* 09/07/2015   PLT 60 Large platelets present* 09/07/2015   NEUTROABS 1.3* 09/07/2015    Imaging:  No results found.  Medications: I have reviewed the patient's current medications.  Assessment/Plan: 1. 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 No evidence of hypermetabolic nodal or distant metastasis.  Radiation 06/11/2015-08/03/2015; weekly Taxol/carboplatin 06/11/2015-07/24/2015 2. Cirrhosis 3. Solid dysphagia secondary to  #1 4. Hepatitis C 5. Polysubstance abuse 6. History of coronary artery disease 7. Thrombocytopenia-likely secondary to cirrhosis, hepatitis C, and alcohol use 8. Esophageal varices 9. Barrett's esophagus 10. Report of "Numbness" and leg pain-etiology unclear, neurology evaluation pending 11. Neutropenia secondary to chemotherapy, Taxol and carboplatin dose reduced with week #2 chemotherapy 06/25/2015 12. Gastrostomy feeding tube placement 06/26/2015   Disposition: Mr. Lassiter appears well. He is swallowing without difficulty, tolerating a soft diet. He is no longer utilizing the feeding tube. He is gaining weight.  We made a referral to Dr. Carlean Purl for a follow-up endoscopy.  He will return for a follow-up visit here in 6 weeks. If he is doing well at that time we will refer him for removal of the feeding tube.  Patient seen with Dr. Benay Spice.  Ned Card ANP/GNP-BC   09/07/2015  12:35 PM   This was a shared visit with Ned Card. Mr. Al has a much improved performance status. We will refer him to Dr. Carlean Purl for a repeat endoscopy. We will arrange for removal of the feeding tube if the endoscopy confirms resolution of the esophagus mass.  Julieanne Manson, M.D.

## 2015-09-07 NOTE — Progress Notes (Signed)
Radiation Oncology         [phone]) [phone] ________________________________  Name: Patrick [name] MRN: [id number]  Date: 09/07/2015  DOB: 11-26-1950  Follow-Up Visit Note  CC: Pcp Not In System  No ref. 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   Follow up s/p rad txs   06/11/15- 08/03/15 , just saw [facility] Robinsons and Dr. Benay Spice today, referral top Dr. Carlean Purl for f/u endoscopy, not using peg tube, drinks and swallows without difficulty,eating solid  Soft foods, mostly fish, , was stopped on Gabapentin,  Drinking ensure plus,  Percocet pills helping his pain better, vitals taken in med onc  And weight 165.lb 142/92,P=78,RR=18 T=97.7 There were no vitals taken for this visit.  Wt Readings from Last 3 Encounters:  09/07/15 165 lb 1.6 oz (74.889 kg)  08/10/15 148 lb (67.132 kg)  08/07/15 149 lb 6.4 oz (67.767 kg)                                ALLERGIES:  has No Known Allergies.  Meds: Current Outpatient Prescriptions  Medication Sig Dispense Refill  . oxyCODONE-acetaminophen (PERCOCET) 7.5-325 MG tablet Take 1 tablet by mouth every 4 (four) hours as needed for severe pain. 60 tablet 0  . polyethylene glycol (MIRALAX / GLYCOLAX) packet Place 17 g into feeding tube daily as needed for mild constipation. 14 each 0  . Sennosides-Docusate Sodium (SENOKOT S PO) Take 2 tablets by mouth daily. Reported on 08/10/2015    . sorbitol 70 % solution Take 30 mLs by mouth daily as needed. 473 mL 1  . triamcinolone cream (KENALOG) 0.1 % Apply 1 application topically 2 (two) times daily as needed (apply to rash). Reported on 08/07/2015    . Wound Dressings (SONAFINE) Apply 1 application topically daily. Reported on 08/07/2015      . aspirin 81 MG tablet Place 1 tablet (81 mg total) into feeding tube daily. 30 tablet   . atorvastatin (LIPITOR) 40 MG tablet Place 1 tablet (40 mg total) into feeding tube daily at 6 PM. (Patient not taking: Reported on 08/10/2015) 30 tablet 3  . carvedilol (COREG) 12.5 MG tablet Place 1 tablet (12.5 mg total) into feeding tube 2 (two) times daily with a meal. (Patient not taking: Reported on 08/10/2015)    . feeding supplement (BOOST / RESOURCE BREEZE) LIQD Take 1 Container by mouth 3 (three) times daily between meals. (Patient not taking: Reported on 08/10/2015) 90 Container 0  . folic acid (FOLVITE) 1 MG tablet Place 1 tablet (1 mg total) into feeding tube daily. (Patient not taking: Reported on 08/10/2015) 30 tablet 0  . furosemide (LASIX) 20 MG tablet Place 1 tablet (20 mg total) into feeding tube daily as needed for edema. Take 1 tablet as needed for fluid retention (Patient not taking: Reported on 08/10/2015) 30 tablet 3  . gabapentin (NEURONTIN) 600 MG tablet Take half tablet three times daily via PEG for one week, then increase to 1 tablet three times daily. (Patient not taking: Reported on 09/07/2015) 90 tablet 5  . lisinopril (PRINIVIL,ZESTRIL) 20 MG tablet Place 1 tablet (20 mg total) into  feeding tube daily. (Patient not taking: Reported on 08/10/2015) 30 tablet 0  . Multiple Vitamin (MULTIVITAMIN WITH MINERALS) TABS tablet Place 1 tablet into feeding tube daily. (Patient not taking: Reported on 08/10/2015) 30 tablet 0  . nicotine (NICODERM CQ - DOSED IN MG/24 HOURS) 21 mg/24hr patch Place 1 patch (21 mg total) onto the skin daily. (Patient not taking: Reported on 09/07/2015) 28 patch 0  . nitroGLYCERIN (NITROSTAT) 0.4 MG SL tablet Place 1 tablet (0.4 mg total) under the tongue every 5 (five) minutes as needed for chest pain. (Patient not taking: Reported on 08/07/2015) 25 tablet 10  . nortriptyline (PAMELOR) 10 MG capsule Take 1 capsule (10 mg total) by mouth at bedtime. 30 capsule 3  . Nutritional  Supplements (FEEDING SUPPLEMENT, OSMOLITE 1.5 CAL,) LIQD Place 1,000 mLs into feeding tube continuous. 1000 mL 100  . nystatin (MYCOSTATIN) 100000 UNIT/ML suspension Take 5 mLs (500,000 Units total) by mouth 4 (four) times daily. (Patient not taking: Reported on 08/10/2015) 60 mL 0  . ondansetron (ZOFRAN) 8 MG tablet Take 1 tablet (8 mg total) by mouth every 8 (eight) hours as needed for nausea or vomiting. (Patient not taking: Reported on 08/10/2015) 20 tablet 0  . sucralfate (CARAFATE) 1 g tablet Take 1 tablet (1 g total) by mouth 4 (four) times daily -  with meals and at bedtime. 5 min before meals for radiation induced esophagitis (Patient not taking: Reported on 08/07/2015) 120 tablet 2   No current facility-administered medications for this encounter.    Physical Findings: The patient is in no acute distress. Patient is alert and oriented.  vitals were not taken for this visit..     Lab Findings: Lab Results  Component Value Date   WBC 2.8* 09/07/2015   HGB 10.7* 09/07/2015   HCT 32.8* 09/07/2015   MCV 110.9* 09/07/2015   PLT 60 Large platelets present* 09/07/2015     Radiographic Findings: No results found.  Impression:    The patient clinically is doing well. He is very pleased that his swallowing has significantly improved. Still on a soft diet. He saw medical oncology earlier today and they have ordered a repeat follow-up endoscopy through Dr. Carlean Purl.  Plan:  The patient is scheduled to see medical oncology again in 6 weeks and they will further discuss possible removal of his PEG tube at that time. I will plan to see him in 3 months.   Jodelle Gross, M.D., Ph.D.

## 2015-09-13 NOTE — Progress Notes (Signed)
  Radiation Oncology         [phone] ________________________________  Name: Patrick [name] MRN: [id number]  Date: 08/03/2015  DOB: 27-Sep-1950  End of Treatment Note  Diagnosis:   Esophageal cancer     Indication for treatment::  curative       Radiation treatment dates:   06/11/2015 through 08/03/2015  Site/dose:   The patient was treated to the esophageal tumor and surrounding high-risk region using a IMRT technique. This initially delivered 45 gray at 1.8 gray per fraction. A 9 gray boost was then given to yield a total dose of 54 gray. Concurrent chemotherapy was given.  Narrative: The patient tolerated radiation treatment relatively well.   Symptoms such as esophagitis were treated symptomatically during the course of radiation.  Plan: The patient has completed radiation treatment. The patient will return to radiation oncology clinic for routine followup in one month. I advised the patient to call or return sooner if they have any questions or concerns related to their recovery or treatment. ________________________________  Jodelle Gross, M.D., Ph.D.

## 2015-09-26 ENCOUNTER — Telehealth: Payer: Self-pay | Admitting: *Deleted

## 2015-09-26 DIAGNOSIS — K5900 Constipation, unspecified: Secondary | ICD-10-CM

## 2015-09-26 DIAGNOSIS — C153 Malignant neoplasm of upper third of esophagus: Secondary | ICD-10-CM

## 2015-09-26 MED ORDER — SORBITOL 70 % PO SOLN: 30.0000 mL | Freq: Every day | ORAL | Status: DC | PRN

## 2015-09-26 NOTE — Telephone Encounter (Signed)
Pt returned call, needs Sorbitol refill. Rx refilled electronically.

## 2015-09-26 NOTE — Telephone Encounter (Signed)
Voicemail: "I need a refill but the bottle says no refills so I need it authorized.  Please call [phone]."  No drug name mentioned in voicemail.  Called receiving voicemail.  Message left asking patient to call pharmacy or call office to request pain medicines for prescription pick up.  Awaiting call from patient or pharmacy request.

## 2015-09-28 ENCOUNTER — Encounter: Payer: Self-pay | Admitting: Internal Medicine

## 2015-09-28 ENCOUNTER — Ambulatory Visit (INDEPENDENT_AMBULATORY_CARE_PROVIDER_SITE_OTHER): Payer: Medicare HMO | Admitting: Internal Medicine

## 2015-09-28 VITALS — BP 122/86 | HR 60 | Ht 69.0 in | Wt 172.2 lb

## 2015-09-28 DIAGNOSIS — C154 Malignant neoplasm of middle third of esophagus: Secondary | ICD-10-CM

## 2015-09-28 DIAGNOSIS — Z86010 Personal history of colonic polyps: Secondary | ICD-10-CM | POA: Diagnosis not present

## 2015-09-28 NOTE — Patient Instructions (Addendum)
You have been given a separate informational sheet regarding your tobacco use, the importance of quitting and local resources to help you quit.   You have been scheduled for an endoscopy. Please follow written instructions given to you at your visit today. If you use inhalers (even only as needed), please bring them with you on the day of your procedure.  I appreciate the opportunity to care for you. Carl Gessner, MD, FACG 

## 2015-09-28 NOTE — Assessment & Plan Note (Signed)
Time to reassess w/ EGD. 10/30/2015 is plan

## 2015-09-28 NOTE — Progress Notes (Signed)
   Subjective:    Patient ID: Patrick [name], male    DOB: 1950/11/20, 65 y.o.   MRN: [id number]  chief complaint is follow-up of esophageal cancer  HPI She is here doing well at this time now eating food after treatment for his squamous cell carcinoma the upper esophagus. He's had radiation and chemotherapy. He still has a gastrostomy tube entities only flushing. He is gaining weight. He also has a history of colon and rectal polyps and he has not had a repeat colonoscopy for that, though it was expected previously it has not yet been done. He is interested in having his gastrostomy tube removed [place] in his review notes possible to do that.  Medications, allergies, past medical history, past surgical history, family history and social history are reviewed and updated in the EMR.  Review of Systems As above    Objective:   Physical Exam BP 122/86 mmHg  Pulse 60  Ht 5\' 9"  (1.753 m)  Wt 172 lb 3.2 oz (78.109 kg)  BMI 25.42 kg/m2 Eyes anicteric There is a gastrostomy tube in left upper quadrant bandaged Abdomen is soft and nontender     Assessment & Plan:   1. 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 Personal history of colonic polyps    At this time we will pursue follow-up of esophageal cancer. If he doesn't have any evidence of disease on endoscopy and biopsies and I can arrange removal of the gastrostomy tube. Colonoscopy can be scheduled at a later date follow-up previous colon and rectal polyps.

## 2015-10-03 ENCOUNTER — Telehealth: Payer: Self-pay | Admitting: Neurology

## 2015-10-03 ENCOUNTER — Other Ambulatory Visit: Payer: Self-pay | Admitting: *Deleted

## 2015-10-03 MED ORDER — NORTRIPTYLINE HCL 10 MG PO CAPS: 20.0000 mg | ORAL_CAPSULE | Freq: Every day | ORAL | Status: DC

## 2015-10-03 NOTE — Telephone Encounter (Signed)
I spoke with patient and he said that he would like to increase the nortriptyline to 20 mg.  Rx sent in to [facility].

## 2015-10-03 NOTE — Telephone Encounter (Signed)
What dose of nortriptyline he is on?  If he is not having side effects, we could try to optimize the medication and increase the dose.  He was probably started on 10mg  at bedtime, which can be increase to 20mg  at bedtime.  Nada Godley K. Posey Pronto, DO

## 2015-10-03 NOTE — Telephone Encounter (Signed)
Please advise 

## 2015-10-03 NOTE — Telephone Encounter (Signed)
Patrick [name] called stating he was out of his nortriplyline and wants to know if He should refill it.  He said he is not having any adverse reactions, but did not Want to refill if Dr. Posey Pronto didn't think he needed. Please call and advise.   He also asked about the oxycodone, if He should get a higher dose (Cancer center prescribed).  I told him that Dr. Posey Pronto would not Write a script for this, so he said not to bother asking her about it.

## 2015-10-04 ENCOUNTER — Telehealth: Payer: Self-pay | Admitting: *Deleted

## 2015-10-04 NOTE — Telephone Encounter (Signed)
Message from pt requesting refill on pain medicine. Returned call, pt reports pain is "not cancer related. I was having this pain before the cancer." He went on to explain pain is related to neuropathy. Recommended he follow up with PCP for this pain. Pt stated he will try calling when Dr. Lisbeth Renshaw is back in the office.

## 2015-10-04 NOTE — Telephone Encounter (Signed)
Noted.  We can certainly titrate this up, as needed.

## 2015-10-04 NOTE — Telephone Encounter (Addendum)
Pt had called requesting refil on percocet, for his neuropathy pain in his legs, we treated his esophaguis, and is eating  Spoke with our PA [employer], and she suggested we,he should try his Primary MD  For his neuropathy pain, last treated for esophageal cancer rad tx 12/16, and that Dr. Lisbeth Renshaw out of the office today 5:18 PM

## 2015-10-05 ENCOUNTER — Telehealth: Payer: Self-pay | Admitting: *Deleted

## 2015-10-05 ENCOUNTER — Telehealth: Payer: Self-pay | Admitting: Neurology

## 2015-10-05 NOTE — Telephone Encounter (Signed)
Informed patient that Dr. Posey Pronto does not prescribe pain medication and instructed him to call his PCP.

## 2015-10-05 NOTE — Telephone Encounter (Signed)
PT called and said he is having pain and wanted something for it/Dawn CB# [phone]

## 2015-10-05 NOTE — Telephone Encounter (Signed)
Returned call to patient again after speaking with Dr. Lisbeth Renshaw,  Patient to call his Primary MD about his chronic  neuropathy pain, we cannot manage that for him, as he is no longer under treatment, we treated his esophagus not his neuropathy,patient is eating well  Without swallowing difficulties,   1:34 PM

## 2015-10-11 ENCOUNTER — Telehealth: Payer: Self-pay | Admitting: *Deleted

## 2015-10-11 NOTE — Telephone Encounter (Signed)
Message from Fernanda Drum with [facility]. Pt is being discharged from [facility]. He has met goals and is no longer homebound.

## 2015-10-12 ENCOUNTER — Telehealth: Payer: Self-pay

## 2015-10-12 ENCOUNTER — Telehealth: Payer: Self-pay | Admitting: Oncology

## 2015-10-12 NOTE — Telephone Encounter (Signed)
s.w. pt and advised on April appt °

## 2015-10-12 NOTE — Telephone Encounter (Signed)
Patient called regarding the rescheduled appt with Dr. Benay Spice.  He is VERY anxious about having the "tube" removed from his stomach and was hoping that Dr. Benay Spice could take it out that day.  Writer reassured him that Dr. Benay Spice would be going over the endoscopy results and the decision would be discussed that day but the tube would not be removed at that appt.

## 2015-10-12 NOTE — Telephone Encounter (Signed)
Patient called and questioned why he was seeing Dr. Benay Spice before his endoscopy.  Per Dr. Gearldine Shown notes "We will refer him to Dr. Carlean Purl for a repeat endoscopy. We will arrange for removal of the feeding tube if the endoscopy confirms resolution of the esophagus mass."  Patient's appt with Dr. Carlean Purl is on 10/30/15, his appt with Dr. Benay Spice is 10/19/15.  Patient is asking to change that appt with Dr. Benay Spice until after the endoscopy. Writer placed a POF for appt change.

## 2015-10-15 ENCOUNTER — Telehealth: Payer: Self-pay | Admitting: *Deleted

## 2015-10-15 NOTE — Telephone Encounter (Signed)
"  May I receive the flu and pneumonia vaccine?  Can they both be received at the same time?  When I had my last visit with the [facility] nurse last week, we discussed immunizations and she instructed me to call Dr. Gearldine Shown office with these questions.  I'll go to my pharmacy to receive them."  Advised he may receive both and no harm in same day administration.  No further questions.

## 2015-10-16 ENCOUNTER — Ambulatory Visit: Payer: Medicare HMO | Admitting: Cardiology

## 2015-10-16 NOTE — Progress Notes (Signed)
HPI: FU CAD. Patient states he had myocardial infarction in January of 2011, August 2011 and November 2011. The first 2 occurred in the setting of cocaine use. He apparently had PCI following the second event. Review of his records showed that his catheterization revealed a 70% mid LAD and a 99% stenosis in the posterior left ventricular branch of the right coronary artery. He had angioplasty of that lesion in August of 2011. Patient continued to have chest pain and cardiac catheterization was therefore performed in June of 2012 and revealed EF 60-65%. Normal LM, LAD - proximal 30-40% stenosis, mid 60-70% stenosis, and distal 50% stenosis. Left cx - 40-50% mid stenosis and a 50% stenosis into the first OM branch. RCA mid vessel has 50% stenosis. The PDA has 40-50% stenosis. Medical therapy recommended. Nuclear study July 2016 showed ejection fraction 48%.There was diaphragmatic attenuation but no ischemia. Echocardiogram September 2016 showed normal LV systolic function, mild aortic insufficiency. Presently treated for esophageal cancer. Since last seen, Patient denies dyspnea, chest pain, palpitations or syncope.  Current Outpatient Prescriptions  Medication Sig Dispense Refill  . aspirin 81 MG tablet Place 1 tablet (81 mg total) into feeding tube daily. 30 tablet   . atorvastatin (LIPITOR) 40 MG tablet Place 1 tablet (40 mg total) into feeding tube daily at 6 PM. 30 tablet 3  . carvedilol (COREG) 12.5 MG tablet Place 1 tablet (12.5 mg total) into feeding tube 2 (two) times daily with a meal.    . feeding supplement (BOOST / RESOURCE BREEZE) LIQD Take 1 Container by mouth 3 (three) times daily between meals. 90 Container 0  . folic acid (FOLVITE) 1 MG tablet Place 1 tablet (1 mg total) into feeding tube daily. 30 tablet 0  . furosemide (LASIX) 20 MG tablet Place 1 tablet (20 mg total) into feeding tube daily as needed for edema. Take 1 tablet as needed for fluid retention 30 tablet 3  .  gabapentin (NEURONTIN) 600 MG tablet Take half tablet three times daily via PEG for one week, then increase to 1 tablet three times daily. 90 tablet 5  . lisinopril (PRINIVIL,ZESTRIL) 20 MG tablet Place 1 tablet (20 mg total) into feeding tube daily. 30 tablet 0  . Multiple Vitamin (MULTIVITAMIN WITH MINERALS) TABS tablet Place 1 tablet into feeding tube daily. 30 tablet 0  . nicotine (NICODERM CQ - DOSED IN MG/24 HOURS) 21 mg/24hr patch Place 1 patch (21 mg total) onto the skin daily. 28 patch 0  . nitroGLYCERIN (NITROSTAT) 0.4 MG SL tablet Place 1 tablet (0.4 mg total) under the tongue every 5 (five) minutes as needed for chest pain. 25 tablet 10  . nortriptyline (PAMELOR) 10 MG capsule Take 2 capsules (20 mg total) by mouth at bedtime. 60 capsule 3  . Nutritional Supplements (FEEDING SUPPLEMENT, OSMOLITE 1.5 CAL,) LIQD Place 1,000 mLs into feeding tube continuous. 1000 mL 100  . nystatin (MYCOSTATIN) 100000 UNIT/ML suspension Take 5 mLs (500,000 Units total) by mouth 4 (four) times daily. 60 mL 0  . ondansetron (ZOFRAN) 8 MG tablet Take 1 tablet (8 mg total) by mouth every 8 (eight) hours as needed for nausea or vomiting. 20 tablet 0  . oxyCODONE-acetaminophen (PERCOCET) 7.5-325 MG tablet Take 1 tablet by mouth every 4 (four) hours as needed for severe pain. 60 tablet 0  . polyethylene glycol (MIRALAX / GLYCOLAX) packet Place 17 g into feeding tube daily as needed for mild constipation. 14 each 0  . Sennosides-Docusate Sodium (SENOKOT  S PO) Take 2 tablets by mouth daily. Reported on 09/28/2015    . sorbitol 70 % solution Take 30 mLs by mouth daily as needed. 473 mL 1  . sucralfate (CARAFATE) 1 g tablet Take 1 tablet (1 g total) by mouth 4 (four) times daily -  with meals and at bedtime. 5 min before meals for radiation induced esophagitis 120 tablet 2  . triamcinolone cream (KENALOG) 0.1 % Apply 1 application topically 2 (two) times daily as needed (apply to rash). Reported on 09/28/2015    . Wound  Dressings (SONAFINE) Apply 1 application topically daily. Reported on 08/07/2015     No current facility-administered medications for this visit.     Past Medical History  Diagnosis Date  . MI (myocardial infarction) ([place])     x3, jan, aug, nov 2011; treated at [facility] in [place]  . Sexual dysfunction   . HTN (hypertension)   . Hyperlipidemia   . Chronic chest pain   . CAD (coronary artery disease)   . Thrombocytopenia ([place])   . SSBE (short-segment Barrett's esophagus) 11/11/11  . Colon adenomas 11/11/11    x 5, one rectal tv adenoma  . GERD (gastroesophageal reflux disease)   . 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 Barrett's esophagus 11/17/2011    Short-segment dx 11/2011   . Arthritis     knees  . Cataract     bil removed  . Anemia   . Alcoholism ([place]) 01/02/2012    cocaine abuse, herion abuse in past  . 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 Alcoholic peripheral neuropathy [facility])     Past Surgical History  Procedure Laterality Date  . Hernia repair      very young  . Balloon angioplasty, artery  03/2010  . Colonoscopy  11/11/11    Dr. Silvano Rusk  . Esophagogastroduodenoscopy  11/11/11    Dr. Silvano Rusk  . Cardiac catheterization  01/31/2011  . Coronary angioplasty    . Cataract extraction w/phaco  08/18/2012    Procedure: CATARACT EXTRACTION PHACO AND INTRAOCULAR LENS PLACEMENT (IOC);  Surgeon: Adonis Brook, MD;  Location: [facility];  Service: Ophthalmology;  Laterality: Right;  . Cataract extraction w/phaco Left 09/22/2012    Procedure: CATARACT EXTRACTION PHACO AND INTRAOCULAR LENS PLACEMENT (IOC);  Surgeon: Adonis Brook, MD;  Location: [facility];  Service: Ophthalmology;  Laterality: Left;  . Upper gastrointestinal endoscopy    . Esophagogastroduodenoscopy (egd) with propofol N/A 04/27/2015    Procedure: ESOPHAGOGASTRODUODENOSCOPY (EGD) WITH PROPOFOL;  Surgeon: Jerene Bears, MD;  Location: [facility];  Service: Endoscopy;  Laterality: N/A;  . Peg placement N/A  06/26/2015    Procedure: PERCUTANEOUS ENDOSCOPIC GASTROSTOMY (PEG) PLACEMENT;  Surgeon: Gatha Mayer, MD;  Location: [facility] END[facility]COPY;  Service: Endoscopy;  Laterality: N/A;    Social History   Social History  . Marital Status: Single    Spouse Name: N/A  . Number of Children: 3  . Years of Education: N/A   Occupational History  .      Unemployed   Social History Main Topics  . Smoking status: Current Some Day Smoker -- 0.10 packs/day for 45 years    Types: Cigarettes  . Smokeless tobacco: Never Used     Comment: 1-2 cig/day  . Alcohol Use: 0.0 oz/week    0 Standard drinks or equivalent per week     Comment: Pt reports drinking 2 bottles of wine tonight. "I drink a 5th of wine daily".  . Drug  Use: No     Comment: Cocaine + 08/2012. Previous heroin, cocaine and marijuana abuse.   Marland Kitchen Sexual Activity: Not Currently   Other Topics Concern  . Not on file   Social History Narrative   Single, lives alone   Poor social support-depends on others for transportation   Fall Risk-ambulates w/cane. Has rolling walker at home   Chronic depression    Family History  Problem Relation Age of Onset  . Cancer      mother's side/fathers side, 2 uncles  . Heart attack Maternal Grandfather   . Diabetes Sister   . Stroke Sister   . Colon cancer Neg Hx   . Esophageal cancer Neg Hx   . Rectal cancer Neg Hx   . Stomach cancer Neg Hx   . Healthy Daughter   . Healthy Son     ROS: Peripheral neuropathy and some dysphagia but no fevers or chills, productive cough, hemoptysis, odynophagia, melena, hematochezia, dysuria, hematuria, rash, seizure activity, orthopnea, PND, pedal edema, claudication. Remaining systems are negative.  Physical Exam: Well-developed well-nourished in no acute distress.  Skin is warm and dry.  HEENT is normal.  Neck is supple.  Chest is clear to auscultation with normal expansion.  Cardiovascular exam is regular rate and rhythm.  Abdominal exam nontender or  distended. No masses palpated.  Feeding tube in abdomen Extremities show no edema. neuro grossly intact  ECG Sinus with no significant ST changes

## 2015-10-18 ENCOUNTER — Ambulatory Visit (INDEPENDENT_AMBULATORY_CARE_PROVIDER_SITE_OTHER): Payer: Medicare HMO | Admitting: Cardiology

## 2015-10-18 ENCOUNTER — Encounter: Payer: Self-pay | Admitting: Cardiology

## 2015-10-18 VITALS — BP 148/98 | HR 90 | Ht 69.0 in | Wt 175.0 lb

## 2015-10-18 DIAGNOSIS — E785 Hyperlipidemia, unspecified: Secondary | ICD-10-CM | POA: Diagnosis not present

## 2015-10-18 DIAGNOSIS — I10 Essential (primary) hypertension: Secondary | ICD-10-CM

## 2015-10-18 DIAGNOSIS — I2583 Coronary atherosclerosis due to lipid rich plaque: Principal | ICD-10-CM

## 2015-10-18 DIAGNOSIS — I2510 Atherosclerotic heart disease of native coronary artery without angina pectoris: Secondary | ICD-10-CM

## 2015-10-18 DIAGNOSIS — Z720 Tobacco use: Secondary | ICD-10-CM

## 2015-10-18 NOTE — Patient Instructions (Signed)
Your physician wants you to follow-up in: 6 MONTHS WITH DR CRENSHAW You will receive a reminder letter in the mail two months in advance. If you don't receive a letter, please call our office to schedule the follow-up appointment.   If you need a refill on your cardiac medications before your next appointment, please call your pharmacy.  

## 2015-10-18 NOTE — Assessment & Plan Note (Addendum)
Blood pressure is elevated today but he is not taking his lisinopril as he ran out. He has a prescription and will resume this in the near future. Follow blood pressure and adjust after taking all medications.

## 2015-10-18 NOTE — Assessment & Plan Note (Signed)
Patient counseled on discontinuing. 

## 2015-10-18 NOTE — Assessment & Plan Note (Signed)
Continue aspirin and statin. 

## 2015-10-18 NOTE — Assessment & Plan Note (Signed)
Continue statin. 

## 2015-10-19 ENCOUNTER — Ambulatory Visit: Payer: Medicare HMO | Admitting: Oncology

## 2015-10-19 ENCOUNTER — Encounter: Payer: Self-pay | Admitting: Neurology

## 2015-10-19 ENCOUNTER — Ambulatory Visit (INDEPENDENT_AMBULATORY_CARE_PROVIDER_SITE_OTHER): Payer: Medicare HMO | Admitting: Neurology

## 2015-10-19 VITALS — BP 138/80 | HR 86 | Ht 69.0 in | Wt 175.1 lb

## 2015-10-19 DIAGNOSIS — F17200 Nicotine dependence, unspecified, uncomplicated: Secondary | ICD-10-CM | POA: Diagnosis not present

## 2015-10-19 DIAGNOSIS — G621 Alcoholic polyneuropathy: Secondary | ICD-10-CM | POA: Diagnosis not present

## 2015-10-19 DIAGNOSIS — R278 Other lack of coordination: Secondary | ICD-10-CM | POA: Diagnosis not present

## 2015-10-19 DIAGNOSIS — G620 Drug-induced polyneuropathy: Secondary | ICD-10-CM | POA: Diagnosis not present

## 2015-10-19 DIAGNOSIS — T451X5A Adverse effect of antineoplastic and immunosuppressive drugs, initial encounter: Secondary | ICD-10-CM

## 2015-10-19 NOTE — Progress Notes (Signed)
Follow-up Visit   Date: 10/19/2015    Patrick [name] MRN: [id number] DOB: 1950/10/25   Interim History: Patrick [name] is a 65 y.o.  right-handed African American male with CAD s/p PCI, current tobacco use, hepatitis C, alcoholic cirrhosis, polysubstance abuse, thrombocytopenia, and newly diagnosed squamous cell carcinoma of the esophagus returning to the clinic for follow-up of peripheral neuropathy.  The patient was accompanied to the clinic by self.  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 Imaging of the thoracic spine noted widespread nonspecific decreased bone marrow signal. No spinal stenosis or spinal cord signal abnormalities. A 4.5 cm segment of severe circumferential esophageal wall thickening was noted at the level of the thyroid. He has completed his first course of radiation and chemotherapy with taxol and carboplatin last week.   Prior to the discovery of his malignancy, patient had been complaining of weight loss and weakness and numbness of the legs. He reports having painful tingling, sharp shooting pain of the feet for at least a year. Symptoms started in the feet and gradually involved his lower legs. Numbness extends all the way to his knees bilaterally. He also complains of pain involving the finger tips which was intermittent and mild for a long time. Since early December 2016, he has noticed that the pain and numbness has moved up to his mid-thigh and there is constant pain of the hands. He reports problems with imbalance and has been using a cane for the past year. Climbing stairs is challenging and often he has to call someone to help him bring groceries in his home because of gait difficulty. He had one fall in 2016.  He started drinking since the age of 45  and was drinking heavily for at least 30 years (beer, wine, liquor). He quit drinking beer several years ago and started drinking about a pint of liquor daily. He reduced his intake in 2015 and now a bottle of wine will last a few days. He drinks more when his pain is worse. He used to use heroin and cocaine previously.  He takes oxycodone which alleviates some of his pain and lets him sleep. Gabapentin 300mg  TID was written a few months ago, but he denies taking it. He now had a PEG placed and is still struggling to get adjusted to it. He has a home nurse and home PT arranged, but has not started any therapy yet.   UPDATE 10/19/2015: He stopped gabapentin because of leg edema and started on nortriptyline 10mg  at bedtime and has noticed that he sleeps better and noticed 50% improvement in painful shooting paresthesias. He takes oxycodone once per day as needed.  His numbness remains constant up to the level of the knees.  He has been discharged from home PT because of meeting goals.  He has not had any interval falls, but does reports unsteadiness often which is why he uses his cane.  His vitamin B1 level as undetectable and he has been started on supplementation.  He still drinks 4-5 times per week, but not as excessively as previously. He is trying to quit smoking and is using a nicotene patch.   Medications:  Current Outpatient Prescriptions on File Prior to Visit  Medication Sig Dispense Refill  . aspirin 81 MG tablet Place 1 tablet (81 mg total)  into feeding tube daily. 30 tablet   . atorvastatin (LIPITOR) 40 MG tablet Place 1 tablet (40 mg total) into feeding tube daily at 6 PM. 30 tablet 3  . carvedilol (COREG) 12.5 MG tablet Place 1 tablet (12.5 mg total) into feeding tube 2 (two) times daily with a meal.    . feeding supplement (BOOST / RESOURCE BREEZE) LIQD Take 1 Container by mouth 3 (three) times daily between meals. 90 Container 0  . lisinopril (PRINIVIL,ZESTRIL) 20 MG tablet Place 1  tablet (20 mg total) into feeding tube daily. 30 tablet 0  . Multiple Vitamin (MULTIVITAMIN WITH MINERALS) TABS tablet Place 1 tablet into feeding tube daily. 30 tablet 0  . nicotine (NICODERM CQ - DOSED IN MG/24 HOURS) 21 mg/24hr patch Place 1 patch (21 mg total) onto the skin daily. 28 patch 0  . nitroGLYCERIN (NITROSTAT) 0.4 MG SL tablet Place 1 tablet (0.4 mg total) under the tongue every 5 (five) minutes as needed for chest pain. 25 tablet 10  . nortriptyline (PAMELOR) 10 MG capsule Take 2 capsules (20 mg total) by mouth at bedtime. 60 capsule 3  . ondansetron (ZOFRAN) 8 MG tablet Take 1 tablet (8 mg total) by mouth every 8 (eight) hours as needed for nausea or vomiting. 20 tablet 0  . oxyCODONE-acetaminophen (PERCOCET) 7.5-325 MG tablet Take 1 tablet by mouth every 4 (four) hours as needed for severe pain. 60 tablet 0  . polyethylene glycol (MIRALAX / GLYCOLAX) packet Place 17 g into feeding tube daily as needed for mild constipation. 14 each 0  . sorbitol 70 % solution Take 30 mLs by mouth daily as needed. 473 mL 1  . triamcinolone cream (KENALOG) 0.1 % Apply 1 application topically 2 (two) times daily as needed (apply to rash). Reported on 09/28/2015    . Wound Dressings (SONAFINE) Apply 1 application topically daily. Reported on 08/07/2015     No current facility-administered medications on file prior to visit.    Allergies: No Known Allergies  Review of Systems:  CONSTITUTIONAL: No fevers, chills, night sweats, or weight loss.  EYES: No visual changes or eye pain ENT: No hearing changes.  No history of nose bleeds.   RESPIRATORY: No cough, wheezing and shortness of breath.   CARDIOVASCULAR: Negative for chest pain, and palpitations.   GI: Negative for abdominal discomfort, blood in stools or black stools.  No recent change in bowel habits.   GU:  No history of incontinence.   MUSCLOSKELETAL: +history of joint pain or swelling.  No myalgias.   SKIN: Negative for lesions, rash, and  itching.   ENDOCRINE: Negative for cold or heat intolerance, polydipsia or goiter.   PSYCH:  No depression or anxiety symptoms.   NEURO: As Above.   Vital Signs:  BP 138/80 mmHg  Pulse 86  Ht 5\' 9"  (1.753 m)  Wt 175 lb 2 oz (79.436 kg)  BMI 25.85 kg/m2  SpO2 96%   Neurological Exam: MENTAL STATUS including orientation to time, place, person, recent and remote memory, attention span and concentration, language, and fund of knowledge is normal.  Speech is not dysarthric.  CRANIAL NERVES: Pupils equal round and reactive to light.  Normal conjugate, extra-ocular eye movements in all directions of gaze.  Face is symmetric. Palate elevates symmetrically.  Tongue is midline.  MOTOR:  Motor strength is 5/5 in the upper extremities  Right Lower Extremity:    Left Lower Extremity:    Hip flexors *improved 5/5   Hip flexors *improved 5/5  Hip extensors  5/5   Hip extensors  5/5   Knee flexors  5/5   Knee flexors  5/5   Knee extensors  5/5   Knee extensors  5/5   Dorsiflexors  5/5   Dorsiflexors  5/5   Plantarflexors  5/5   Plantarflexors  5-/5   Toe extensors *improved 5-/5   Toe extensors  5/5   Toe flexors  5/5   Toe flexors  5/5   Tone (Ashworth scale)  0  Tone (Ashworth scale)  0   MSRs:  Reflexes are 2+/4 in the upper extremities, trace bilateral knee jerks and absent Achilles.  SENSORY:  Vibration reduced to 75% at the knees (improved) and absent distal to ankles.  Romberg's sign is present.   COORDINATION/GAIT:   Gait slow, mildly ataxic and slightly wide-based.  He is easily able to rise to stand without using arms.   Data: MRI thoracic spine wwo contrast 04/26/2015: 1. 4 cm segment of abnormal esophageal wall thickening at the thoracic inlet, suspicious for esophageal carcinoma. Possible enlarged 14 mm lymph node in the right tracheoesophageal groove. Recommend endoscopy. 2. Abnormal bone marrow signal diffusely, without destructive lesion or marrow edema in the thoracic  spine. Query anemia causing red marrow reactivation. Otherwise metastatic disease to bone cannot be excluded. 3. No thoracic spinal stenosis, cord compression, or thoracic spinal cord abnormality identified to explain the sensory changes  CT head 04/26/2015: 1. No acute intracranial process. 2. Generalized age-related cerebral atrophy with chronic microvascular ischemic disease.  Labs 08/09/2014:  Folate 13.3, vitamin B1 7*, copper 155, vitamin B12 682, MMA 108    IMPRESSION/PLAN: 1.  Severe and chronic length dependent peripheral neuropathy with improved painful paresthesias, persistent numbness and sensory ataxia  - Etiology:  Long history of alcohol abuse, chemotherapy, thiamine deficiency  - Clinically, painful paresthesias and weakness has improved!    - Again, I stressed the importance of alcohol cessation  - NCS/EMG declined previously, with his classic features testing is not necessary unless symptoms evolve in an atypical manner   2.  Generalized weakness due to deconditioning - improved with PT  - Completed home PT, now doing home exercises   PLAN/RECOMMENDATIONS:  1.  Continue thiamine 100mg  daily, recheck levels at next visit 2.  Continue nortriptyline 10mg  at bedtime 3.  Continue to use cane.  Fall precautions discussed 4.  Alcohol cessation strongly recommended 5.  The patient was counseled on the dangers of tobacco use, and was advised to quit.  Reviewed strategies to maximize success.  Return to clinic in 6 months  The duration of this appointment visit was 30 minutes of face-to-face time with the patient.  Greater than 50% of this time was spent in counseling, explanation of diagnosis, planning of further management, and coordination of care.   Thank you for allowing me to participate in patient's care.  If I can answer any additional questions, I would be pleased to do so.    Sincerely,    Austine Wiedeman K. Posey Pronto, DO

## 2015-10-19 NOTE — Patient Instructions (Addendum)
1.  Continue thiamine 100mg  daily, recheck levels at next visit 2.  Continue nortriptyline 10mg  at bedtime 3.  Continue to use cane 4.  Advised to continue to but back on alcohol intake  Return to clinic in 6 months

## 2015-10-30 ENCOUNTER — Ambulatory Visit (AMBULATORY_SURGERY_CENTER): Payer: Medicare HMO | Admitting: Internal Medicine

## 2015-10-30 ENCOUNTER — Encounter: Payer: Self-pay | Admitting: Internal Medicine

## 2015-10-30 VITALS — BP 138/84 | HR 77 | Temp 97.7°F | Resp 19 | Ht 69.0 in | Wt 175.0 lb

## 2015-10-30 DIAGNOSIS — K222 Esophageal obstruction: Secondary | ICD-10-CM

## 2015-10-30 DIAGNOSIS — C154 Malignant neoplasm of middle third of esophagus: Secondary | ICD-10-CM

## 2015-10-30 MED ORDER — SODIUM CHLORIDE 0.9 % IV SOLN: 500.0000 mL | INTRAVENOUS | Status: DC

## 2015-10-30 NOTE — Progress Notes (Signed)
Called to room to assist during endoscopic procedure.  Patient ID and intended procedure confirmed with present staff. Received instructions for my participation in the procedure from the performing physician.  

## 2015-10-30 NOTE — Progress Notes (Signed)
A and O Report to RN 

## 2015-10-30 NOTE — Progress Notes (Signed)
Dental Warning given

## 2015-10-30 NOTE — Patient Instructions (Addendum)
There was a stricture at top of esophagus - I biopsied it and dilated it. I removed the stomach tube.  Keep a clean bandage on it until it is closed - after a while a band aid may be fine.  Clean the area with soap and water.  May bathe - shower - tomorrow.  Will call with biopsy results and plans.  I appreciate the opportunity to care for you. Gatha Mayer, MD, [facility]   Discharge instructions given. Handout on a dilatation diet. Resume previous medications. YOU HAD AN ENDOSCOPIC PROCEDURE TODAY AT [facility]:   Refer to the procedure report that was given to you for any specific questions about what was found during the examination.  If the procedure report does not answer your questions, please call your gastroenterologist to clarify.  If you requested that your care partner not be given the details of your procedure findings, then the procedure report has been included in a sealed envelope for you to review at your convenience later.  YOU SHOULD EXPECT: Some feelings of bloating in the abdomen. Passage of more gas than usual.  Walking can help get rid of the air that was put into your GI tract during the procedure and reduce the bloating. If you had a lower endoscopy (such as a colonoscopy or flexible sigmoidoscopy) you may notice spotting of blood in your stool or on the toilet paper. If you underwent a bowel prep for your procedure, you may not have a normal bowel movement for a few days.  Please Note:  You might notice some irritation and congestion in your nose or some drainage.  This is from the oxygen used during your procedure.  There is no need for concern and it should clear up in a day or so.  SYMPTOMS TO REPORT IMMEDIATELY:   Following upper endoscopy (EGD)  Vomiting of blood or coffee ground material  New chest pain or pain under the shoulder blades  Painful or persistently difficult swallowing  New shortness of breath  Fever of 100F or  higher  Black, tarry-looking stools  For urgent or emergent issues, a gastroenterologist can be reached at any hour by calling [phone].   DIET: Your first meal following the procedure should be a small meal and then it is ok to progress to your normal diet. Heavy or fried foods are harder to digest and may make you feel nauseous or bloated.  Likewise, meals heavy in dairy and vegetables can increase bloating.  Drink plenty of fluids but you should avoid alcoholic beverages for 24 hours.  ACTIVITY:  You should plan to take it easy for the rest of today and you should NOT DRIVE or use heavy machinery until tomorrow (because of the sedation medicines used during the test).    FOLLOW UP: Our staff will call the number listed on your records the next business day following your procedure to check on you and address any questions or concerns that you may have regarding the information given to you following your procedure. If we do not reach you, we will leave a message.  However, if you are feeling well and you are not experiencing any problems, there is no need to return our call.  We will assume that you have returned to your regular daily activities without incident.  If any biopsies were taken you will be contacted by phone or by letter within the next 1-3 weeks.  Please call us at [phone]  [phone] if you have not heard about the biopsies in 3 weeks.    SIGNATURES/CONFIDENTIALITY: You and/or your care partner have signed paperwork which will be entered into your electronic medical record.  These signatures attest to the fact that that the information above on your After Visit Summary has been reviewed and is understood.  Full responsibility of the confidentiality of this discharge information lies with you and/or your care-partner.

## 2015-10-30 NOTE — Op Note (Signed)
[facility] Patient Name: Patrick [name] Procedure Date: 10/30/2015 7:22 AM MRN: [id number] Endoscopist: Gatha Mayer , MD Age: 65 Referring MD:  Date of Birth: 12-May-1951 Gender: Male Procedure:                Upper GI endoscopy Indications:              Follow-up of esophageal tumor, Follow-up of                            malignant esophageal squamous cell carcinoma Medicines:                Propofol total dose 200 mg IV, Monitored Anesthesia                            Care Procedure:                Pre-Anesthesia Assessment:                           - Prior to the procedure, a History and Physical                            was performed, and patient medications and                            allergies were reviewed. The patient's tolerance of                            previous anesthesia was also reviewed. The risks                            and benefits of the procedure and the sedation                            options and risks were discussed with the patient.                            All questions were answered, and informed consent                            was obtained. Prior Anticoagulants: The patient has                            taken no previous anticoagulant or antiplatelet                            agents. ASA Grade Assessment: III - A patient with                            severe systemic disease. After reviewing the risks                            and benefits, the patient was deemed in  satisfactory condition to undergo the procedure.                           After obtaining informed consent, the endoscope was                            passed under direct vision. Throughout the                            procedure, the patient's blood pressure, pulse, and                            oxygen saturations were monitored continuously. The                            Model GIF-[id number]) scope was introduced                        through the mouth, and advanced to the second part                            of duodenum. After obtaining informed consent, the                            endoscope was passed under direct vision.                            Throughout the procedure, the patient's blood                            pressure, pulse, and oxygen saturations were                            monitored continuously.The upper GI endoscopy was                            accomplished without difficulty. The patient                            tolerated the procedure well. Scope In: Scope Out: Findings:      One severe (stenosis; an endoscope cannot pass) stenosis was found. And       was traversed after dilation. A TTS dilator was passed through the       scope. Dilation with a dilator was performed to 12 mm. Biopsies were       taken with a cold forceps for histology.      There was evidence of a gastrostomy present on the anterior wall of the       stomach.      The exam was otherwise without abnormality.      The cardia and gastric fundus were normal on retroflexion. Complications:            No immediate complications. Estimated Blood Loss:     Estimated blood loss: none. Impression:               - Esophageal stenosis. Dilated. Biopsied.                           -  Gastrostomy present.                           - The examination was otherwise normal. Recommendation:           - Patient has a contact number available for                            emergencies. The signs and symptoms of potential                            delayed complications were discussed with the                            patient. Return to normal activities tomorrow.                            Written discharge instructions were provided to the                            patient.                           - Clear liquids x 1 hour then soft foods rest of                            day. Start prior diet tomorrow.                            - Continue present medications.                           - Await pathology results.                           Gastrostomy tube removed without difficult after                            procedure completed - he is eating without                            difficulty, stricture looks benign (XRT) and he                            wanted tube out. Procedure Code(s):        --- Professional ---                           [phone], Esophagogastroduodenoscopy, flexible,                            transoral; with transendoscopic balloon dilation of                            esophagus (less than 30 mm diameter)  [id number], Esophagogastroduodenoscopy, flexible,                            transoral; with biopsy, single or multiple CPT copyright 2016 American Medical Association. All rights reserved. Gatha Mayer, MD 10/30/2015 8:15:29 AM This report has been signed electronically. Number of Addenda: 0 CC Letter to:             Dominica Severin B. Michaelene Song Referring MD:      Claris Gladden Ouida Sills

## 2015-10-31 ENCOUNTER — Telehealth: Payer: Self-pay

## 2015-10-31 NOTE — Telephone Encounter (Signed)
  Follow up Call-  Call back number 10/30/2015 03/08/2015  Post procedure Call Back phone  # [phone] cell [phone] cell  Permission to leave phone message Yes Yes     Patient questions:  Do you have a fever, pain , or abdominal swelling? No. Pain Score  0 *  Have you tolerated food without any problems? Yes.    Have you been able to return to your normal activities? Yes.    Do you have any questions about your discharge instructions: Diet   No. Medications  No. Follow up visit  No.  Do you have questions or concerns about your Care? No.  Actions: * If pain score is 4 or above: No action needed, pain <4.

## 2015-11-05 NOTE — Progress Notes (Signed)
Quick Note:  Please let him know from office - no cancer in bxs Recall EGD ([facility]) 1 year ______

## 2015-11-06 ENCOUNTER — Ambulatory Visit (HOSPITAL_BASED_OUTPATIENT_CLINIC_OR_DEPARTMENT_OTHER): Payer: Medicare HMO | Admitting: Oncology

## 2015-11-06 ENCOUNTER — Ambulatory Visit: Payer: Medicare HMO | Admitting: Internal Medicine

## 2015-11-06 VITALS — BP 164/95 | HR 86 | Temp 98.3°F | Resp 18 | Ht 69.0 in | Wt 179.0 lb

## 2015-11-06 DIAGNOSIS — D6959 Other secondary thrombocytopenia: Secondary | ICD-10-CM | POA: Diagnosis not present

## 2015-11-06 DIAGNOSIS — B1920 Unspecified viral hepatitis C without hepatic coma: Secondary | ICD-10-CM

## 2015-11-06 DIAGNOSIS — K7460 Unspecified cirrhosis of liver: Secondary | ICD-10-CM | POA: Diagnosis not present

## 2015-11-06 DIAGNOSIS — Z8501 Personal history of malignant neoplasm of esophagus: Secondary | ICD-10-CM | POA: Diagnosis not present

## 2015-11-06 DIAGNOSIS — C154 Malignant neoplasm of middle third of esophagus: Secondary | ICD-10-CM

## 2015-11-06 NOTE — Progress Notes (Signed)
  [facility] OFFICE PROGRESS NOTE   Diagnosis: Esophagus cancer  INTERVAL HISTORY:   Patrick [name] returns as scheduled. He underwent an upper endoscopy by Dr. Carlean Purl on 10/30/2015. A stenosis in the upper third of the esophagus was dilated. A biopsy revealed benign squamous mucosa. He has dysphagia with certain foods, but in general he is tolerating a diet. The feeding tube was removed.   Objective:  Vital signs in last 24 hours:  Blood pressure 164/95, pulse 86, temperature 98.3 F (36.8 C), temperature source Oral, resp. rate 18, height 5\' 9"  (1.753 m), weight 179 lb (81.194 kg), SpO2 97 %.    HEENT: Neck without mass Lymphatics: No cervical, supraclavicular, or axillary nodes Resp: Lungs clear bilaterally Cardio: Regular rate and rhythm GI: No hepatomegaly, no mass, nontender Vascular: No leg edema    Medications: I have reviewed the patient's current medications.  Assessment/Plan: 1. 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 No evidence of hypermetabolic nodal or distant metastasis.  Radiation 06/11/2015-08/03/2015; weekly Taxol/carboplatin 06/11/2015-07/24/2015  Upper endoscopy 10/30/2015, status post dilation of an upper esophagus stricture, biopsy negative for tumor  2. Cirrhosis 3. Solid dysphagia secondary to #1 4. Hepatitis C 5. Polysubstance abuse 6. History of coronary artery disease 7. Thrombocytopenia-likely secondary to cirrhosis, hepatitis C, and alcohol use 8. Esophageal varices 9. Barrett's esophagus 10. Report of "Numbness" and leg pain-etiology unclear, neurology evaluation  pending 11. Neutropenia secondary to chemotherapy, Taxol and carboplatin dose reduced with week #2 chemotherapy 06/25/2015 12. Gastrostomy feeding tube placement 06/26/2015, removed 10/30/2015   Disposition:  Patrick [name] is in clinical remission from esophagus cancer. He will return for an office visit in 4 months. He will contact Dr. Carlean Purl for progressive dysphagia.  Betsy Coder, MD  11/06/2015  8:41 AM

## 2015-11-07 ENCOUNTER — Telehealth: Payer: Self-pay | Admitting: Oncology

## 2015-11-07 NOTE — Telephone Encounter (Signed)
per pof to sch pt appt-cld & spoke w/pt and gave pt time & date of appt for 8/7 @10 :42

## 2015-12-04 ENCOUNTER — Encounter: Payer: Self-pay | Admitting: Internal Medicine

## 2016-01-13 ENCOUNTER — Emergency Department (HOSPITAL_COMMUNITY)
Admission: EM | Admit: 2016-01-13 | Discharge: 2016-01-13 | Disposition: A | Discharge status: Home / Self Care | Payer: Medicare HMO | Attending: Emergency Medicine | Admitting: Emergency Medicine

## 2016-01-13 ENCOUNTER — Encounter (HOSPITAL_COMMUNITY): Payer: Self-pay

## 2016-01-13 DIAGNOSIS — I10 Essential (primary) hypertension: Secondary | ICD-10-CM | POA: Insufficient documentation

## 2016-01-13 DIAGNOSIS — Z79899 Other long term (current) drug therapy: Secondary | ICD-10-CM | POA: Diagnosis not present

## 2016-01-13 DIAGNOSIS — R1010 Upper abdominal pain, unspecified: Secondary | ICD-10-CM | POA: Diagnosis present

## 2016-01-13 DIAGNOSIS — R1011 Right upper quadrant pain: Secondary | ICD-10-CM

## 2016-01-13 DIAGNOSIS — I252 Old myocardial infarction: Secondary | ICD-10-CM | POA: Insufficient documentation

## 2016-01-13 DIAGNOSIS — Z8501 Personal history of malignant neoplasm of esophagus: Secondary | ICD-10-CM | POA: Insufficient documentation

## 2016-01-13 DIAGNOSIS — F17210 Nicotine dependence, cigarettes, uncomplicated: Secondary | ICD-10-CM | POA: Insufficient documentation

## 2016-01-13 DIAGNOSIS — I2510 Atherosclerotic heart disease of native coronary artery without angina pectoris: Secondary | ICD-10-CM | POA: Diagnosis not present

## 2016-01-13 DIAGNOSIS — Z7982 Long term (current) use of aspirin: Secondary | ICD-10-CM | POA: Insufficient documentation

## 2016-01-13 DIAGNOSIS — G629 Polyneuropathy, unspecified: Secondary | ICD-10-CM | POA: Diagnosis not present

## 2016-01-13 LAB — COMPREHENSIVE METABOLIC PANEL
ALBUMIN: 3.5 g/dL (ref 3.5–5.0)
ALT: 50 U/L (ref 17–63)
AST: 106 U/L — AB (ref 15–41)
Alkaline Phosphatase: 119 U/L (ref 38–126)
Anion gap: 7 (ref 5–15)
BUN: 6 mg/dL (ref 6–20)
CHLORIDE: 106 mmol/L (ref 101–111)
CO2: 22 mmol/L (ref 22–32)
CREATININE: 0.99 mg/dL (ref 0.61–1.24)
Calcium: 9.4 mg/dL (ref 8.9–10.3)
GFR calc Af Amer: >60 mL/min (ref >60)
GFR calc non Af Amer: >60 mL/min (ref >60)
Glucose, Bld: 93 mg/dL (ref 65–99)
POTASSIUM: 4 mmol/L (ref 3.5–5.1)
SODIUM: 135 mmol/L (ref 135–145)
Total Bilirubin: 0.9 mg/dL (ref 0.3–1.2)
Total Protein: 8.1 g/dL (ref 6.5–8.1)

## 2016-01-13 LAB — URINALYSIS, ROUTINE W REFLEX MICROSCOPIC
Bilirubin Urine: NEGATIVE
GLUCOSE, UA: NEGATIVE mg/dL
HGB URINE DIPSTICK: NEGATIVE
Ketones, ur: NEGATIVE mg/dL
LEUKOCYTES UA: NEGATIVE
Nitrite: NEGATIVE
PH: 5.5 (ref 5.0–8.0)
PROTEIN: NEGATIVE mg/dL
Specific Gravity, Urine: 1.017 (ref 1.005–1.030)

## 2016-01-13 LAB — CBC
HEMATOCRIT: 35.2 % — AB (ref 39.0–52.0)
Hemoglobin: 11.9 g/dL — ABNORMAL LOW (ref 13.0–17.0)
MCH: 34 pg (ref 26.0–34.0)
MCHC: 33.8 g/dL (ref 30.0–36.0)
MCV: 100.6 fL — AB (ref 78.0–100.0)
PLATELETS: 53 10*3/uL — AB (ref 150–400)
RBC: 3.5 MIL/uL — ABNORMAL LOW (ref 4.22–5.81)
RDW: 14.9 % (ref 11.5–15.5)
WBC: 3.3 10*3/uL — ABNORMAL LOW (ref 4.0–10.5)

## 2016-01-13 LAB — DIFFERENTIAL
BASOS PCT: 0 %
Basophils Absolute: 0 10*3/uL (ref 0.0–0.1)
EOS ABS: 0.1 10*3/uL (ref 0.0–0.7)
EOS PCT: 2 %
LYMPHS PCT: 30 %
Lymphs Abs: 1 10*3/uL (ref 0.7–4.0)
Monocytes Absolute: 0.6 10*3/uL (ref 0.1–1.0)
Monocytes Relative: 18 %
Neutro Abs: 1.6 10*3/uL — ABNORMAL LOW (ref 1.7–7.7)
Neutrophils Relative %: 50 %

## 2016-01-13 LAB — LIPASE, BLOOD: LIPASE: 75 U/L — AB (ref 11–51)

## 2016-01-13 NOTE — ED Provider Notes (Signed)
CSN: [id number]     Arrival date & time 01/13/16  1013 History   First MD Initiated Contact with Patient 01/13/16 1134     Chief Complaint  Patient presents with  . Neuropathy   . Abdominal Pain     (Consider location/radiation/quality/duration/timing/severity/associated sxs/prior Treatment) HPI Comments: Viola Placeres is a 65 y.o. male with history of esophageal cancer s/p chemotherapy in remission, CAD, HLD, and HTN presents to ED with complaint of neuropathy and abdominal pain. Patient states neuropathy is chronic in nature in lower extremities b/l and is no different today. He has been seen by neurology and placed on nortriptyline, which provides relief. He was prescribed percocet for further pain management and reports to ED today for refill of pain medication as he only has one pill left. Patient denies any new numbness or weakness. No headache.  Patient also endorses intermittent abdominal pain across upper abdomen since March. When he experiences pain it is sharp in nature and worse with movement. He denies nausea, vomiting, diarrhea, constipation, hematochezia, melena, dysuria, or hematuria. No fever or chills. No trauma. Per pt, his PCP is supposed to schedule CT scan of abdomen and pelvis and he is awaiting an appointment. Patient not currently having abdominal pain.   Patient is a 65 y.o. male presenting with abdominal pain. The history is provided by the patient.  Abdominal Pain Associated symptoms: no chest pain, no chills, no constipation, no diarrhea, no dysuria, no fever, no hematuria, no nausea, no shortness of breath, no sore throat and no vomiting     Past Medical History  Diagnosis Date  . MI (myocardial infarction) ([place])     x3, jan, aug, nov 2011; treated at [facility] in [place]  . Sexual dysfunction   . HTN (hypertension)   . Hyperlipidemia   . Chronic chest pain   . CAD (coronary artery disease)   . Thrombocytopenia ([place])   . SSBE (short-segment Barrett's esophagus)  11/11/11  . Colon adenomas 11/11/11    x 5, one rectal tv adenoma  . GERD (gastroesophageal reflux disease)   . 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 Barrett's esophagus 11/17/2011    Short-segment dx 11/2011   . Arthritis     knees  . Cataract     bil removed  . Anemia   . Alcoholism ([place]) 01/02/2012    cocaine abuse, herion abuse in past  . Cancer of thoracic esophagus ([place])   . Alcoholic peripheral neuropathy ([place])   . Allergy   . Substance abuse     cocaine abuse in past   Past Surgical History  Procedure Laterality Date  . Hernia repair      very young  . Balloon angioplasty, artery  03/2010  . Colonoscopy  11/11/11    Dr. Silvano Rusk  . Esophagogastroduodenoscopy  11/11/11    Dr. Silvano Rusk  . Cardiac catheterization  01/31/2011  . Coronary angioplasty    . Cataract extraction w/phaco  08/18/2012    Procedure: CATARACT EXTRACTION PHACO AND INTRAOCULAR LENS PLACEMENT (IOC);  Surgeon: Adonis Brook, MD;  Location: [facility];  Service: Ophthalmology;  Laterality: Right;  . Cataract extraction w/phaco Left 09/22/2012    Procedure: CATARACT EXTRACTION PHACO AND INTRAOCULAR LENS PLACEMENT (IOC);  Surgeon: Adonis Brook, MD;  Location: [facility];  Service: Ophthalmology;  Laterality: Left;  . Upper gastrointestinal endoscopy    . Esophagogastroduodenoscopy (egd) with propofol N/A 04/27/2015    Procedure: ESOPHAGOGASTRODUODENOSCOPY (EGD) WITH PROPOFOL;  Surgeon: Jerene Bears, MD;  Location: [facility];  Service: Endoscopy;  Laterality: N/A;  . Peg placement N/A 06/26/2015    Procedure: PERCUTANEOUS ENDOSCOPIC GASTROSTOMY (PEG) PLACEMENT;  Surgeon: Gatha Mayer, MD;  Location: [facility];  Service: Endoscopy;  Laterality: N/A;   Family History  Problem Relation Age of Onset  . Cancer      mother's side/fathers side, 2 uncles  . Heart attack Maternal Grandfather   . Diabetes Sister   . Stroke Sister   . Colon cancer Neg Hx   . Esophageal cancer Neg Hx   . Rectal cancer  Neg Hx   . Stomach cancer Neg Hx   . Healthy Daughter   . Healthy Son    Social History  Substance Use Topics  . Smoking status: Current Some Day Smoker -- 0.10 packs/day for 45 years    Types: Cigarettes  . Smokeless tobacco: Never Used     Comment: 1-2 cig/day  . Alcohol Use: 0.0 oz/week    0 Standard drinks or equivalent per week     Comment: Pt reports drinking 2 bottles of wine tonight. "I drink a 5th of wine daily".    Review of Systems  Constitutional: Positive for diaphoresis ( nighttime - chronic). Negative for fever and chills.  HENT: Negative for sore throat and trouble swallowing.   Eyes: Negative for pain.  Respiratory: Negative for shortness of breath.   Cardiovascular: Negative for chest pain and leg swelling.  Gastrointestinal: Positive for abdominal pain ( not currently experiencing). Negative for nausea, vomiting, diarrhea, constipation and blood in stool.  Genitourinary: Negative for dysuria and hematuria.  Musculoskeletal: Negative for neck pain and neck stiffness.  Skin: Negative for rash.  Allergic/Immunologic:       H/o esophageal cancer, per pt. Currently in remission  Neurological: Positive for numbness ( chronic in nature b/l lower extremities). Negative for dizziness, syncope, weakness, light-headedness and headaches.       B/l chronic neuropathy in lower extremities      Allergies  Review of patient's allergies indicates no known allergies.  Home Medications   Prior to Admission medications   Medication Sig Start Date End Date Taking? Authorizing Provider  aspirin 81 MG tablet Place 1 tablet (81 mg total) into feeding tube daily. Patient taking differently: Take 81 mg by mouth daily.  06/29/15   Hosie Poisson, MD  atorvastatin (LIPITOR) 40 MG tablet Place 1 tablet (40 mg total) into feeding tube daily at 6 PM. 06/29/15   Hosie Poisson, MD  carvedilol (COREG) 12.5 MG tablet Place 1 tablet (12.5 mg total) into feeding tube 2 (two) times daily with a  meal. 06/29/15   Hosie Poisson, MD  feeding supplement (BOOST / RESOURCE BREEZE) LIQD Take 1 Container by mouth 3 (three) times daily between meals. 04/30/15   Thurnell Lose, MD  lisinopril (PRINIVIL,ZESTRIL) 20 MG tablet Place 1 tablet (20 mg total) into feeding tube daily. 06/29/15   Hosie Poisson, MD  Multiple Vitamin (MULTIVITAMIN WITH MINERALS) TABS tablet Place 1 tablet into feeding tube daily. 06/29/15   Hosie Poisson, MD  nicotine (NICODERM CQ - DOSED IN MG/24 HOURS) 21 mg/24hr patch Place 1 patch (21 mg total) onto the skin daily. 06/29/15   Hosie Poisson, MD  nitroGLYCERIN (NITROSTAT) 0.4 MG SL tablet Place 1 tablet (0.4 mg total) under the tongue every 5 (five) minutes as needed for chest pain. 08/06/15   Brett Canales, PA-C  nortriptyline (PAMELOR) 10 MG capsule Take 2 capsules (20 mg total) by mouth at  bedtime. 10/03/15   Donika Keith Rake, DO  oxyCODONE-acetaminophen (PERCOCET) 7.5-325 MG tablet Take 1 tablet by mouth every 4 (four) hours as needed for severe pain. 08/29/15   Kyung Rudd, MD  polyethylene glycol [facility] / GLYCOLAX) packet Place 17 g into feeding tube daily as needed for mild constipation. 06/29/15   Hosie Poisson, MD  sorbitol 70 % solution Take 30 mLs by mouth daily as needed. 09/26/15   Ladell Pier, MD  triamcinolone cream (KENALOG) 0.1 % Apply 1 application topically 2 (two) times daily as needed (apply to rash). Reported on 09/28/2015 04/14/15   Historical Provider, MD  Wound Dressings (SONAFINE) Apply 1 application topically daily. Reported on 10/30/2015    Historical Provider, MD   BP 107/92 mmHg  Pulse 89  Temp(Src) 98.1 F (36.7 C)  Resp 15  Ht _0  (1.753 m)  Wt 79.379 kg  BMI 25.83 kg/m2  SpO2 99% Physical Exam  Constitutional: He appears well-developed and well-nourished. No distress.  HENT:  Head: Normocephalic and atraumatic.  Mouth/Throat: Oropharynx is clear and moist. No oropharyngeal exudate.  Eyes: Conjunctivae and EOM are normal. Pupils are equal,  round, and reactive to light. Right eye exhibits no discharge. Left eye exhibits no discharge. No scleral icterus.  Neck: Normal range of motion. Neck supple.  Cardiovascular: Normal rate, regular rhythm, normal heart sounds and intact distal pulses.   No murmur heard. Pulmonary/Chest: Effort normal and breath sounds normal. No respiratory distress.  Abdominal: Soft. Bowel sounds are normal. There is tenderness ( RUQ). There is guarding ( mild in RUQ). There is no rebound.  Musculoskeletal: Normal range of motion.  Lymphadenopathy:    He has no cervical adenopathy.  Neurological: He is alert. Coordination normal.  Mental Status:  Alert, thought content appropriate, able to give a coherent history. Speech fluent without evidence of aphasia. Able to follow 2 step commands without difficulty.  Cranial Nerves:  II:  Peripheral visual fields grossly normal, pupils equal, round, reactive to light III,IV, VI: ptosis not present, extra-ocular motions intact bilaterally  V,VII: smile symmetric, facial light touch sensation equal VIII: hearing grossly normal to voice  X: uvula elevates symmetrically  XI: bilateral shoulder shrug symmetric and strong XII: midline tongue extension without fassiculations Motor:  Normal tone. 5/5 in upper and lower extremities bilaterally including strong and equal grip strength and dorsiflexion/plantar flexion Sensory: Sensation to light touch grossly intact in all extremities.  Cerebellar: normal finger-to-nose with bilateral upper extremities Gait: Patient able to ambulate with assistance of cane. Gait is steady. Some balance issues with eyes closed, but per pt this is baseline secondary to neuropathy.  CV: distal pulses palpable throughout     Skin: Skin is warm and dry. He is not diaphoretic.  Psychiatric: He has a normal mood and affect. His behavior is normal.    ED Course  Procedures (including critical care time) Labs Review Labs Reviewed  LIPASE,  BLOOD - Abnormal; Notable for the following:    Lipase 75 (*)    All other components within normal limits  COMPREHENSIVE METABOLIC PANEL - Abnormal; Notable for the following:    AST 106 (*)    All other components within normal limits  CBC - Abnormal; Notable for the following:    WBC 3.3 (*)    RBC 3.50 (*)    Hemoglobin 11.9 (*)    HCT 35.2 (*)    MCV 100.6 (*)    Platelets 53 (*)    All other components within  normal limits  URINALYSIS, ROUTINE W REFLEX MICROSCOPIC (NOT AT [facility]) - Abnormal; Notable for the following:    Color, Urine AMBER (*)    All other components within normal limits  DIFFERENTIAL - Abnormal; Notable for the following:    Neutro Abs 1.6 (*)    All other components within normal limits    Imaging Review No results found. I have personally reviewed and evaluated these images and lab results as part of my medical decision-making.   EKG Interpretation None      MDM   Final diagnoses:  Neuropathy ([place])  Right upper quadrant pain   Patient is afebrile and non-toxic appearing. His vital signs are stable. Physical exam remarkable for RUQ abdominal TTP with mild gaurding. U/A normal - doubt for nephrolithiasis, UTI, pyelonephritis. Lipase mildly elevated; however, improved from previous records, low suspicion for pancreatitis. Elevated AST; however, improved from previous records. ALT, Alk phos, bilirubin normal. Hemoglobin improved. No elevated WBC. Bedside US completed by Dr. Laneta Simmers shows mild gallbladder wall thickening with no stones, normal appearing CBD. Review of records indicate, previous findings of GB wall thickening without stones. Doubt acute cholecystitis or cholangitis. ?bilirary colic - h/o intermittent abdominal pain with known GB thickening. Encouraged follow up with PCP.   For neuropathic pain - pain is unchanged, patient seeking refill on narcotic medication. Neurologic exam showed no acute focal findings. Prognosis of neuropathic pain  discussed. Pt made aware of policy not to d/c narcotic medication for chronic pain. Encouraged follow up with PCP.   Discussed results with patient. Encouraged close follow up with PCP. Provided return precautions. Patient voiced understanding and is agreeable.     Roxanna Mew, [place] 01/14/16 0123  Leo Grosser, MD 01/15/16 [phone]

## 2016-01-13 NOTE — ED Notes (Signed)
Pt ambulates independently and with steady gait at time of discharge. Discharge instructions and follow up information reviewed with patient. No other questions or concerns voiced at this time.  

## 2016-01-13 NOTE — ED Notes (Signed)
Called out and no response to go back for room. Will reattempt

## 2016-01-13 NOTE — ED Notes (Signed)
Patient complains of bilateral leg pain and numbness related to his neuropathy. Out of pain meds.  Patient here with abdominal pain after having thoat cancer and feeding tube removed in March. No other associated symptoms with the GI complaint. Has ben seen by his MD and concerned may be a hernia

## 2016-01-13 NOTE — Discharge Instructions (Signed)
Read the information below.  Your labs look stable compared to previous lab values. No gallstones noted on bedside ultrasound.   Use the prescribed medication as directed.  Please discuss all new medications with your pharmacist.   Be sure to call and follow up with your PCP regarding your pain medication regimen and imaging. You may return to the Emergency Department at any time for worsening condition or any new symptoms that concern you. Return to ED if you develop a fever, chest pain, shortness of breath, inability to keep fluids down, blood in urine and blood in stool.    Peripheral Neuropathy Peripheral neuropathy is a type of nerve damage. It affects nerves that carry signals between the spinal cord and other parts of the body. These are called peripheral nerves. With peripheral neuropathy, one nerve or a group of nerves may be damaged.  CAUSES  Many things can damage peripheral nerves. For some people with peripheral neuropathy, the cause is unknown. Some causes include:  Diabetes. This is the most common cause of peripheral neuropathy.  Injury to a nerve.  Pressure or stress on a nerve that lasts a long time.  Too little vitamin B. Alcoholism can lead to this.  Infections.  Autoimmune diseases, such as multiple sclerosis and systemic lupus erythematosus.  Inherited nerve diseases.  Some medicines, such as cancer drugs.  Toxic substances, such as lead and mercury.  Too little blood flowing to the legs.  Kidney disease.  Thyroid disease. SIGNS AND SYMPTOMS  Different people have different symptoms. The symptoms you have will depend on which of your nerves is damaged. Common symptoms include:  Loss of feeling (numbness) in the feet and hands.  Tingling in the feet and hands.  Pain that burns.  Very sensitive skin.  Weakness.  Not being able to move a part of the body (paralysis).  Muscle twitching.  Clumsiness or poor coordination.  Loss of balance.  Not  being able to control your bladder.  Feeling dizzy.  Sexual problems. DIAGNOSIS  Peripheral neuropathy is a symptom, not a disease. Finding the cause of peripheral neuropathy can be hard. To figure that out, your health care provider will take a medical history and do a physical exam. A neurological exam will also be done. This involves checking things affected by your brain, spinal cord, and nerves (nervous system). For example, your health care provider will check your reflexes, how you move, and what you can feel.  Other types of tests may also be ordered, such as:  Blood tests.  A test of the fluid in your spinal cord.  Imaging tests, such as CT scans or an MRI.  Electromyography (EMG). This test checks the nerves that control muscles.  Nerve conduction velocity tests. These tests check how fast messages pass through your nerves.  Nerve biopsy. A small piece of nerve is removed. It is then checked under a microscope. TREATMENT   Medicine is often used to treat peripheral neuropathy. Medicines may include:  Pain-relieving medicines. Prescription or over-the-counter medicine may be suggested.  Antiseizure medicine. This may be used for pain.  Antidepressants. These also may help ease pain from neuropathy.  Lidocaine. This is a numbing medicine. You might wear a patch or be given a shot.  Mexiletine. This medicine is typically used to help control irregular heart rhythms.  Surgery. Surgery may be needed to relieve pressure on a nerve or to destroy a nerve that is causing pain.  Physical therapy to help movement.  Assistive  devices to help movement. HOME CARE INSTRUCTIONS   Only take over-the-counter or prescription medicines as directed by your health care provider. Follow the instructions carefully for any given medicines. Do not take any other medicines without first getting approval from your health care provider.  If you have diabetes, work closely with your health  care provider to keep your blood sugar under control.  If you have numbness in your feet:  Check every day for signs of injury or infection. Watch for redness, warmth, and swelling.  Wear padded socks and comfortable shoes. These help protect your feet.  Do not do things that put pressure on your damaged nerve.  Do not smoke. Smoking keeps blood from getting to damaged nerves.  Avoid or limit alcohol. Too much alcohol can cause a lack of B vitamins. These vitamins are needed for healthy nerves.  Develop a good support system. Coping with peripheral neuropathy can be stressful. Talk to a mental health specialist or join a support group if you are struggling.  Follow up with your health care provider as directed. SEEK MEDICAL CARE IF:   You have new signs or symptoms of peripheral neuropathy.  You are struggling emotionally from dealing with peripheral neuropathy.  You have a fever. SEEK IMMEDIATE MEDICAL CARE IF:   You have an injury or infection that is not healing.  You feel very dizzy or begin vomiting.  You have chest pain.  You have trouble breathing.   This information is not intended to replace advice given to you by your health care provider. Make sure you discuss any questions you have with your health care provider.   Document Released: 07/11/2002 Document Revised: 04/02/2011 Document Reviewed: 03/28/2013 Elsevier Interactive Patient Education 2016 [employer]   Abdominal Pain, Adult Many things can cause belly (abdominal) pain. Most times, the belly pain is not dangerous. Many cases of belly pain can be watched and treated at home. HOME CARE   Do not take medicines that help you go poop (laxatives) unless told to by your doctor.  Only take medicine as told by your doctor.  Eat or drink as told by your doctor. Your doctor will tell you if you should be on a special diet. GET HELP IF:  You do not know what is causing your belly pain.  You have belly  pain while you are sick to your stomach (nauseous) or have runny poop (diarrhea).  You have pain while you pee or poop.  Your belly pain wakes you up at night.  You have belly pain that gets worse or better when you eat.  You have belly pain that gets worse when you eat fatty foods.  You have a fever. GET HELP RIGHT AWAY IF:   The pain does not go away within 2 hours.  You keep throwing up (vomiting).  The pain changes and is only in the right or left part of the belly.  You have bloody or tarry looking poop. MAKE SURE YOU:   Understand these instructions.  Will watch your condition.  Will get help right away if you are not doing well or get worse.   This information is not intended to replace advice given to you by your health care provider. Make sure you discuss any questions you have with your health care provider.   Document Released: 01/07/2008 Document Revised: 08/11/2014 Document Reviewed: 03/30/2013 Elsevier Interactive Patient Education [employer].

## 2016-01-13 NOTE — ED Provider Notes (Signed)
Medical screening examination/treatment/procedure(s) were conducted as a shared visit with non-physician practitioner(s) and myself.  I personally evaluated the patient during the encounter.   EKG Interpretation None     65 y.o. male presents with abdominal and back pain that are chronic for refill of narcotic pain meds. No signs of surgical emergency. Previous GB wall thickening is improved from previous study without other s/s of cholecystitis. Well appearing. PCP f/u and OP CT of abdomen, return with worsening.  Emergency Focused Ultrasound Exam Limited ultrasound of the gallbladder and common bile duct.   Performed and interpreted by Dr. Laneta Simmers Indication: Right upper quadrant abdominal pain Multiple views of the gallbladder and common bile duct are obtained with a multi-frequency probe.  Findings: no stones, no pericholecystic fluid, + gallbladder wall thickening at 1.9 mm, no CBD dilation Interpretation: no cholelithiasis, no cholecystitis or choledocholithiasis. Images archived electronically.  CPT Code [id number]  See related encounter note   Leo Grosser, MD 01/15/16 [phone]

## 2016-03-10 ENCOUNTER — Ambulatory Visit: Payer: Medicare HMO | Admitting: Nurse Practitioner

## 2016-03-20 ENCOUNTER — Other Ambulatory Visit (HOSPITAL_COMMUNITY): Payer: Self-pay | Admitting: Nurse Practitioner

## 2016-03-20 DIAGNOSIS — B182 Chronic viral hepatitis C: Secondary | ICD-10-CM

## 2016-04-10 ENCOUNTER — Encounter: Payer: Self-pay | Admitting: Internal Medicine

## 2016-04-10 DIAGNOSIS — D891 Cryoglobulinemia: Secondary | ICD-10-CM

## 2016-04-10 HISTORY — DX: Cryoglobulinemia: D89.1

## 2016-04-24 ENCOUNTER — Encounter: Payer: Self-pay | Admitting: Neurology

## 2016-04-24 ENCOUNTER — Ambulatory Visit (INDEPENDENT_AMBULATORY_CARE_PROVIDER_SITE_OTHER): Payer: Medicare HMO | Admitting: Neurology

## 2016-04-24 VITALS — BP 150/80 | HR 72 | Ht 69.0 in | Wt 182.0 lb

## 2016-04-24 DIAGNOSIS — E5111 Dry beriberi: Secondary | ICD-10-CM | POA: Diagnosis not present

## 2016-04-24 DIAGNOSIS — G620 Drug-induced polyneuropathy: Secondary | ICD-10-CM

## 2016-04-24 DIAGNOSIS — G621 Alcoholic polyneuropathy: Secondary | ICD-10-CM | POA: Diagnosis not present

## 2016-04-24 DIAGNOSIS — T451X5A Adverse effect of antineoplastic and immunosuppressive drugs, initial encounter: Secondary | ICD-10-CM

## 2016-04-24 DIAGNOSIS — E519 Thiamine deficiency, unspecified: Secondary | ICD-10-CM | POA: Diagnosis not present

## 2016-04-24 NOTE — Progress Notes (Signed)
Follow-up Visit   Date: 04/24/16    Patrick [name] MRN: [id number] DOB: July 30, 1951   Interim History: Patrick [name] is a 65 y.o.  right-handed African American male with CAD s/p PCI, current tobacco use, hepatitis C, alcoholic cirrhosis, polysubstance abuse, thrombocytopenia, and newly diagnosed squamous cell carcinoma of the esophagus returning to the clinic for follow-up of peripheral neuropathy.  The patient was accompanied to the clinic by self.  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 Imaging of the thoracic spine noted widespread nonspecific decreased bone marrow signal. No spinal stenosis or spinal cord signal abnormalities. A 4.5 cm segment of severe circumferential esophageal wall thickening was noted at the level of the thyroid. He completed his radiation and chemotherapy with taxol and carboplatin.   Prior to the discovery of his malignancy, patient had been complaining of weight loss, weakness and numbness of the legs. He reports having painful tingling, sharp shooting pain of the feet for at least a year. Symptoms started in the feet and gradually involved his lower legs. Numbness extends all the way to his knees bilaterally. He also has pain involving the finger tips which was intermittent and mild for a long time. Since early December 2016, the pain and numbness has moved up to his mid-thigh and there is constant pain of the hands. He reports problems with imbalance and has been using a cane for the past year. Climbing stairs is challenging and often he has to call someone to help him bring groceries in his home because of gait difficulty. He had one fall in 2016.  He started drinking since the age of 13 and was drinking heavily for at least 30 years (beer, wine, liquor). He  quit drinking beer several years ago and started drinking about a pint of liquor daily. He reduced his intake in 2015 and now a bottle of wine will last a few days. He drinks more when his pain is worse. He previously used heroin and cocaine.  He takes oxycodone which alleviates some of his pain and lets him sleep. Gabapentin 300mg  TID was written a few months ago, but he denies taking it. He now had a PEG placed and is still struggling to get adjusted to it. He has a home nurse and home PT arranged, but has not started any therapy yet.   UPDATE 10/19/2015: He stopped gabapentin because of leg edema and started on nortriptyline 10mg  at bedtime and has noticed that he sleeps better and noticed 50% improvement in painful shooting paresthesias. He takes oxycodone once per day as needed.  His numbness remains constant up to the level of the knees.  He has been discharged from home PT because of meeting goals.  He has not had any interval falls, but does reports unsteadiness often which is why he uses his cane.  His vitamin B1 level as undetectable and he has been started on supplementation.  He still drinks 4-5 times per week, but not as excessively as previously. He is trying to quit smoking and is using a nicotene patch.  UPDATE 04/24/2016:   His pain is much less intense than it previously was, pain is ranked as 2-3/10. He has been complaint taking vitamin B1.  He still admits to drinking 4-5 times per week red wine. He takes oxycodone 10mg  prescribed  by Pain Management for his pain and stopped nortriptyline.  He takes the oxycodone 3-5 times throughout the week, only for severe pain.  He has no new neurological complaints.   Medications:  Current Outpatient Prescriptions on File Prior to Visit  Medication Sig Dispense Refill  . aspirin 81 MG tablet Place 1 tablet (81 mg total) into feeding tube daily. (Patient taking differently: Take 81 mg by mouth daily. ) 30 tablet   . atorvastatin (LIPITOR) 40 MG  tablet Place 1 tablet (40 mg total) into feeding tube daily at 6 PM. 30 tablet 3  . carvedilol (COREG) 12.5 MG tablet Place 1 tablet (12.5 mg total) into feeding tube 2 (two) times daily with a meal.    . feeding supplement (BOOST / RESOURCE BREEZE) LIQD Take 1 Container by mouth 3 (three) times daily between meals. 90 Container 0  . lisinopril (PRINIVIL,ZESTRIL) 20 MG tablet Place 1 tablet (20 mg total) into feeding tube daily. 30 tablet 0  . Multiple Vitamin (MULTIVITAMIN WITH MINERALS) TABS tablet Place 1 tablet into feeding tube daily. 30 tablet 0  . nitroGLYCERIN (NITROSTAT) 0.4 MG SL tablet Place 1 tablet (0.4 mg total) under the tongue every 5 (five) minutes as needed for chest pain. 25 tablet 10   No current facility-administered medications on file prior to visit.     Allergies: No Known Allergies  Review of Systems:  CONSTITUTIONAL: No fevers, chills, night sweats, or weight loss.  EYES: No visual changes or eye pain ENT: No hearing changes.  No history of nose bleeds.   RESPIRATORY: No cough, wheezing and shortness of breath.   CARDIOVASCULAR: Negative for chest pain, and palpitations.   GI: Negative for abdominal discomfort, blood in stools or black stools.  No recent change in bowel habits.   GU:  No history of incontinence.   MUSCLOSKELETAL: +history of joint pain or swelling.  No myalgias.   SKIN: Negative for lesions, rash, and itching.   ENDOCRINE: Negative for cold or heat intolerance, polydipsia or goiter.   PSYCH:  No depression or anxiety symptoms.   NEURO: As Above.   Vital Signs:  BP (!) 150/80 (BP Location: Right Arm, Patient Position: Sitting, Cuff Size: Normal)   Pulse 72   Ht 5\' 9"  (1.753 m)   Wt 182 lb (82.6 kg)   BMI 26.88 kg/m   Neurological Exam: MENTAL STATUS including orientation to time, place, person, recent and remote memory, attention span and concentration, language, and fund of knowledge is normal.  Speech is not dysarthric.  CRANIAL  NERVES: Pupils equal round and reactive to light.  Normal conjugate, extra-ocular eye movements in all directions of gaze.  Face is symmetric. Palate elevates symmetrically.  Tongue is midline.  MOTOR:  Motor strength is 5/5 in the upper extremities  Right Lower Extremity:    Left Lower Extremity:    Hip flexors *improved 5/5   Hip flexors *improved 5/5   Hip extensors  5/5   Hip extensors  5/5   Knee flexors  5/5   Knee flexors  5/5   Knee extensors  5/5   Knee extensors  5/5   Dorsiflexors  5/5   Dorsiflexors  5/5   Plantarflexors  5/5   Plantarflexors  5-/5   Toe extensors *improved 5-/5   Toe extensors  5/5   Toe flexors  5/5   Toe flexors  5/5   Tone (Ashworth scale)  0  Tone (Ashworth scale)  0   MSRs:  Reflexes are  2+/4 in the upper extremities, trace bilateral knee jerks and absent Achilles.  SENSORY:  Vibration is 100% at the knees, trace at the ankles. (improved).  Romberg's sign is present.   COORDINATION/GAIT:   Gait slow, mildly ataxic and slightly wide-based.  Unable to rise to stand without using arms.   Data: MRI thoracic spine wwo contrast 04/26/2015: 1. 4 cm segment of abnormal esophageal wall thickening at the thoracic inlet, suspicious for esophageal carcinoma. Possible enlarged 14 mm lymph node in the right tracheoesophageal groove. Recommend endoscopy. 2. Abnormal bone marrow signal diffusely, without destructive lesion or marrow edema in the thoracic spine. Query anemia causing red marrow reactivation. Otherwise metastatic disease to bone cannot be excluded. 3. No thoracic spinal stenosis, cord compression, or thoracic spinal cord abnormality identified to explain the sensory changes  CT head 04/26/2015: 1. No acute intracranial process. 2. Generalized age-related cerebral atrophy with chronic microvascular ischemic disease.  Labs 08/10/2015:  Folate 13.3, vitamin B1 7*, copper 155, vitamin B12 682, MMA 108  IMPRESSION/PLAN: 1.  Severe and chronic length  dependent peripheral neuropathy with improved painful paresthesias, persistent numbness and sensory ataxia.  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  Deconditioning - encouraged for him to stay active and start walking program  PLAN/RECOMMENDATIONS:  1.  Continue thiamine 100mg  daily for now as he is still consuming alcohol 2.  He takes oxycodone as needed which is followed by Pain Management, but overall paresthesias are markedly improved 3.  Continue to use cane.   4.  Alcohol cessation strongly recommended 5.  The patient was counseled on the dangers of tobacco use, and was advised to quit.  Reviewed strategies to maximize success.  Return to clinic as needed  The duration of this appointment visit was 30 minutes of face-to-face time with the patient.  Greater than 50% of this time was spent in counseling, explanation of diagnosis, planning of further management, and coordination of care.   Thank you for allowing me to participate in patient's care.  If I can answer any additional questions, I would be pleased to do so.    Sincerely,    Donika K. Posey Pronto, DO

## 2016-04-24 NOTE — Patient Instructions (Addendum)
1.  Continue thiamine 100mg  daily for now 2.  Encouraged to quit smoking and alcohol 3.  Continue to use cane.  4.  Start walking daily  Return to clinic if your symptoms get worse

## 2016-05-07 ENCOUNTER — Ambulatory Visit (HOSPITAL_COMMUNITY)
Admission: RE | Admit: 2016-05-07 | Discharge: 2016-05-07 | Disposition: A | Discharge status: Home / Self Care | Payer: Medicare HMO | Source: Ambulatory Visit | Attending: Nurse Practitioner | Admitting: Nurse Practitioner

## 2016-05-07 ENCOUNTER — Encounter (HOSPITAL_COMMUNITY): Payer: Self-pay

## 2016-05-07 DIAGNOSIS — B182 Chronic viral hepatitis C: Secondary | ICD-10-CM

## 2016-05-16 ENCOUNTER — Ambulatory Visit (HOSPITAL_COMMUNITY)
Admission: RE | Admit: 2016-05-16 | Discharge: 2016-05-16 | Disposition: A | Discharge status: Home / Self Care | Payer: Medicare HMO | Source: Ambulatory Visit | Attending: Nurse Practitioner | Admitting: Nurse Practitioner

## 2016-05-16 DIAGNOSIS — K829 Disease of gallbladder, unspecified: Secondary | ICD-10-CM | POA: Diagnosis not present

## 2016-05-16 DIAGNOSIS — I700 Atherosclerosis of aorta: Secondary | ICD-10-CM | POA: Insufficient documentation

## 2016-05-16 DIAGNOSIS — N261 Atrophy of kidney (terminal): Secondary | ICD-10-CM | POA: Diagnosis not present

## 2016-05-16 DIAGNOSIS — B182 Chronic viral hepatitis C: Secondary | ICD-10-CM | POA: Insufficient documentation

## 2016-06-12 ENCOUNTER — Ambulatory Visit (HOSPITAL_COMMUNITY): Payer: Medicare HMO

## 2016-07-26 ENCOUNTER — Other Ambulatory Visit: Payer: Self-pay | Admitting: Nurse Practitioner

## 2016-07-31 ENCOUNTER — Emergency Department (HOSPITAL_COMMUNITY): Payer: Medicare HMO

## 2016-07-31 ENCOUNTER — Encounter (HOSPITAL_COMMUNITY): Payer: Self-pay

## 2016-07-31 ENCOUNTER — Inpatient Hospital Stay (HOSPITAL_COMMUNITY)
Admission: EM | Admit: 2016-07-31 | Discharge: 2016-08-13 | DRG: 432 | Disposition: A | Discharge status: Skilled Nursing Facility | Payer: Medicare HMO | Attending: Family Medicine | Admitting: Family Medicine

## 2016-07-31 DIAGNOSIS — E785 Hyperlipidemia, unspecified: Secondary | ICD-10-CM | POA: Diagnosis present

## 2016-07-31 DIAGNOSIS — Z515 Encounter for palliative care: Secondary | ICD-10-CM | POA: Diagnosis present

## 2016-07-31 DIAGNOSIS — K7031 Alcoholic cirrhosis of liver with ascites: Secondary | ICD-10-CM

## 2016-07-31 DIAGNOSIS — G621 Alcoholic polyneuropathy: Secondary | ICD-10-CM | POA: Diagnosis present

## 2016-07-31 DIAGNOSIS — R195 Other fecal abnormalities: Secondary | ICD-10-CM | POA: Diagnosis not present

## 2016-07-31 DIAGNOSIS — E860 Dehydration: Secondary | ICD-10-CM | POA: Diagnosis present

## 2016-07-31 DIAGNOSIS — F17200 Nicotine dependence, unspecified, uncomplicated: Secondary | ICD-10-CM | POA: Diagnosis present

## 2016-07-31 DIAGNOSIS — K3189 Other diseases of stomach and duodenum: Secondary | ICD-10-CM

## 2016-07-31 DIAGNOSIS — R109 Unspecified abdominal pain: Secondary | ICD-10-CM

## 2016-07-31 DIAGNOSIS — F419 Anxiety disorder, unspecified: Secondary | ICD-10-CM | POA: Diagnosis present

## 2016-07-31 DIAGNOSIS — I252 Old myocardial infarction: Secondary | ICD-10-CM

## 2016-07-31 DIAGNOSIS — N179 Acute kidney failure, unspecified: Secondary | ICD-10-CM | POA: Diagnosis present

## 2016-07-31 DIAGNOSIS — Z9841 Cataract extraction status, right eye: Secondary | ICD-10-CM

## 2016-07-31 DIAGNOSIS — C154 Malignant neoplasm of middle third of esophagus: Secondary | ICD-10-CM | POA: Diagnosis present

## 2016-07-31 DIAGNOSIS — N261 Atrophy of kidney (terminal): Secondary | ICD-10-CM | POA: Diagnosis present

## 2016-07-31 DIAGNOSIS — Z7982 Long term (current) use of aspirin: Secondary | ICD-10-CM

## 2016-07-31 DIAGNOSIS — I959 Hypotension, unspecified: Secondary | ICD-10-CM | POA: Diagnosis not present

## 2016-07-31 DIAGNOSIS — I8510 Secondary esophageal varices without bleeding: Secondary | ICD-10-CM | POA: Diagnosis present

## 2016-07-31 DIAGNOSIS — F1910 Other psychoactive substance abuse, uncomplicated: Secondary | ICD-10-CM | POA: Diagnosis present

## 2016-07-31 DIAGNOSIS — B182 Chronic viral hepatitis C: Secondary | ICD-10-CM | POA: Diagnosis present

## 2016-07-31 DIAGNOSIS — K222 Esophageal obstruction: Secondary | ICD-10-CM | POA: Diagnosis not present

## 2016-07-31 DIAGNOSIS — R001 Bradycardia, unspecified: Secondary | ICD-10-CM | POA: Diagnosis not present

## 2016-07-31 DIAGNOSIS — K767 Hepatorenal syndrome: Secondary | ICD-10-CM | POA: Diagnosis present

## 2016-07-31 DIAGNOSIS — D689 Coagulation defect, unspecified: Secondary | ICD-10-CM | POA: Diagnosis present

## 2016-07-31 DIAGNOSIS — Z961 Presence of intraocular lens: Secondary | ICD-10-CM | POA: Diagnosis present

## 2016-07-31 DIAGNOSIS — E43 Unspecified severe protein-calorie malnutrition: Secondary | ICD-10-CM | POA: Diagnosis present

## 2016-07-31 DIAGNOSIS — Z833 Family history of diabetes mellitus: Secondary | ICD-10-CM

## 2016-07-31 DIAGNOSIS — I2510 Atherosclerotic heart disease of native coronary artery without angina pectoris: Secondary | ICD-10-CM | POA: Diagnosis present

## 2016-07-31 DIAGNOSIS — K766 Portal hypertension: Secondary | ICD-10-CM | POA: Diagnosis present

## 2016-07-31 DIAGNOSIS — R1310 Dysphagia, unspecified: Secondary | ICD-10-CM | POA: Diagnosis present

## 2016-07-31 DIAGNOSIS — Z86010 Personal history of colonic polyps: Secondary | ICD-10-CM

## 2016-07-31 DIAGNOSIS — R0902 Hypoxemia: Secondary | ICD-10-CM

## 2016-07-31 DIAGNOSIS — F329 Major depressive disorder, single episode, unspecified: Secondary | ICD-10-CM | POA: Diagnosis present

## 2016-07-31 DIAGNOSIS — K7200 Acute and subacute hepatic failure without coma: Secondary | ICD-10-CM | POA: Diagnosis not present

## 2016-07-31 DIAGNOSIS — Z66 Do not resuscitate: Secondary | ICD-10-CM | POA: Diagnosis not present

## 2016-07-31 DIAGNOSIS — R710 Precipitous drop in hematocrit: Secondary | ICD-10-CM

## 2016-07-31 DIAGNOSIS — K625 Hemorrhage of anus and rectum: Secondary | ICD-10-CM

## 2016-07-31 DIAGNOSIS — Z720 Tobacco use: Secondary | ICD-10-CM | POA: Diagnosis present

## 2016-07-31 DIAGNOSIS — Z7189 Other specified counseling: Secondary | ICD-10-CM

## 2016-07-31 DIAGNOSIS — Z9842 Cataract extraction status, left eye: Secondary | ICD-10-CM

## 2016-07-31 DIAGNOSIS — I9589 Other hypotension: Secondary | ICD-10-CM | POA: Diagnosis not present

## 2016-07-31 DIAGNOSIS — F1020 Alcohol dependence, uncomplicated: Secondary | ICD-10-CM | POA: Diagnosis present

## 2016-07-31 DIAGNOSIS — Z79899 Other long term (current) drug therapy: Secondary | ICD-10-CM

## 2016-07-31 DIAGNOSIS — N200 Calculus of kidney: Secondary | ICD-10-CM | POA: Diagnosis present

## 2016-07-31 DIAGNOSIS — I4581 Long QT syndrome: Secondary | ICD-10-CM | POA: Diagnosis present

## 2016-07-31 DIAGNOSIS — D696 Thrombocytopenia, unspecified: Secondary | ICD-10-CM | POA: Diagnosis not present

## 2016-07-31 DIAGNOSIS — R1084 Generalized abdominal pain: Secondary | ICD-10-CM

## 2016-07-31 DIAGNOSIS — Z79891 Long term (current) use of opiate analgesic: Secondary | ICD-10-CM

## 2016-07-31 DIAGNOSIS — Z8249 Family history of ischemic heart disease and other diseases of the circulatory system: Secondary | ICD-10-CM

## 2016-07-31 DIAGNOSIS — R339 Retention of urine, unspecified: Secondary | ICD-10-CM | POA: Diagnosis present

## 2016-07-31 DIAGNOSIS — D649 Anemia, unspecified: Secondary | ICD-10-CM

## 2016-07-31 DIAGNOSIS — K921 Melena: Secondary | ICD-10-CM | POA: Diagnosis present

## 2016-07-31 DIAGNOSIS — K7469 Other cirrhosis of liver: Secondary | ICD-10-CM

## 2016-07-31 DIAGNOSIS — F10239 Alcohol dependence with withdrawal, unspecified: Secondary | ICD-10-CM | POA: Diagnosis not present

## 2016-07-31 DIAGNOSIS — Z823 Family history of stroke: Secondary | ICD-10-CM

## 2016-07-31 DIAGNOSIS — K5900 Constipation, unspecified: Secondary | ICD-10-CM | POA: Diagnosis present

## 2016-07-31 DIAGNOSIS — K7040 Alcoholic hepatic failure without coma: Principal | ICD-10-CM | POA: Diagnosis present

## 2016-07-31 DIAGNOSIS — Z923 Personal history of irradiation: Secondary | ICD-10-CM

## 2016-07-31 DIAGNOSIS — K219 Gastro-esophageal reflux disease without esophagitis: Secondary | ICD-10-CM | POA: Diagnosis present

## 2016-07-31 DIAGNOSIS — I10 Essential (primary) hypertension: Secondary | ICD-10-CM | POA: Diagnosis present

## 2016-07-31 DIAGNOSIS — K922 Gastrointestinal hemorrhage, unspecified: Secondary | ICD-10-CM | POA: Diagnosis not present

## 2016-07-31 DIAGNOSIS — Z9221 Personal history of antineoplastic chemotherapy: Secondary | ICD-10-CM

## 2016-07-31 DIAGNOSIS — F10939 Alcohol use, unspecified with withdrawal, unspecified: Secondary | ICD-10-CM | POA: Diagnosis present

## 2016-07-31 DIAGNOSIS — R188 Other ascites: Secondary | ICD-10-CM

## 2016-07-31 DIAGNOSIS — R140 Abdominal distension (gaseous): Secondary | ICD-10-CM

## 2016-07-31 DIAGNOSIS — G8929 Other chronic pain: Secondary | ICD-10-CM | POA: Diagnosis present

## 2016-07-31 DIAGNOSIS — R9431 Abnormal electrocardiogram [ECG] [EKG]: Secondary | ICD-10-CM | POA: Diagnosis not present

## 2016-07-31 LAB — COMPREHENSIVE METABOLIC PANEL
ALT: 46 U/L (ref 17–63)
AST: 114 U/L — AB (ref 15–41)
Albumin: 1.9 g/dL — ABNORMAL LOW (ref 3.5–5.0)
Alkaline Phosphatase: 85 U/L (ref 38–126)
Anion gap: 10 (ref 5–15)
BILIRUBIN TOTAL: 5.4 mg/dL — AB (ref 0.3–1.2)
BUN: 28 mg/dL — AB (ref 6–20)
CHLORIDE: 103 mmol/L (ref 101–111)
CO2: 25 mmol/L (ref 22–32)
CREATININE: 1.77 mg/dL — AB (ref 0.61–1.24)
Calcium: 8.7 mg/dL — ABNORMAL LOW (ref 8.9–10.3)
GFR calc Af Amer: 45 mL/min — ABNORMAL LOW (ref >60)
GFR, EST NON AFRICAN AMERICAN: 39 mL/min — AB (ref >60)
GLUCOSE: 117 mg/dL — AB (ref 65–99)
Potassium: 4.2 mmol/L (ref 3.5–5.1)
Sodium: 138 mmol/L (ref 135–145)
Total Protein: 6.4 g/dL — ABNORMAL LOW (ref 6.5–8.1)

## 2016-07-31 LAB — URINALYSIS, ROUTINE W REFLEX MICROSCOPIC
BILIRUBIN URINE: NEGATIVE
GLUCOSE, UA: NEGATIVE mg/dL
HGB URINE DIPSTICK: NEGATIVE
KETONES UR: NEGATIVE mg/dL
LEUKOCYTES UA: NEGATIVE
Nitrite: NEGATIVE
PROTEIN: NEGATIVE mg/dL
Specific Gravity, Urine: >1.046 — ABNORMAL HIGH (ref 1.005–1.030)
pH: 6 (ref 5.0–8.0)

## 2016-07-31 LAB — CBC
HCT: 26.2 % — ABNORMAL LOW (ref 39.0–52.0)
Hemoglobin: 9.6 g/dL — ABNORMAL LOW (ref 13.0–17.0)
MCH: 36.2 pg — ABNORMAL HIGH (ref 26.0–34.0)
MCHC: 36.6 g/dL — AB (ref 30.0–36.0)
MCV: 98.9 fL (ref 78.0–100.0)
PLATELETS: 29 10*3/uL — AB (ref 150–400)
RBC: 2.65 MIL/uL — ABNORMAL LOW (ref 4.22–5.81)
RDW: 20.1 % — AB (ref 11.5–15.5)
WBC: 5 10*3/uL (ref 4.0–10.5)

## 2016-07-31 LAB — POC OCCULT BLOOD, ED: FECAL OCCULT BLD: POSITIVE — AB

## 2016-07-31 LAB — RAPID URINE DRUG SCREEN, HOSP PERFORMED
AMPHETAMINES: NOT DETECTED
BARBITURATES: NOT DETECTED
BENZODIAZEPINES: NOT DETECTED
Cocaine: NOT DETECTED
Opiates: NOT DETECTED
TETRAHYDROCANNABINOL: NOT DETECTED

## 2016-07-31 LAB — PROTIME-INR
INR: 2.1
Prothrombin Time: 23.9 seconds — ABNORMAL HIGH (ref 11.4–15.2)

## 2016-07-31 LAB — ETHANOL: Alcohol, Ethyl (B): <5 mg/dL (ref <5)

## 2016-07-31 LAB — LIPASE, BLOOD: LIPASE: 30 U/L (ref 11–51)

## 2016-07-31 MED ORDER — LORAZEPAM 2 MG/ML IJ SOLN
0.0000 mg | Freq: Two times a day (BID) | INTRAMUSCULAR | Status: AC
  Administered 2016-08-02: 0 mg via INTRAVENOUS

## 2016-07-31 MED ORDER — LORAZEPAM 1 MG PO TABS
1.0000 mg | ORAL_TABLET | Freq: Four times a day (QID) | ORAL | Status: AC | PRN
  Administered 2016-08-02 – 2016-08-03 (×2): 1 mg via ORAL
  Filled 2016-07-31 (×2): qty 1

## 2016-07-31 MED ORDER — LORAZEPAM 2 MG/ML IJ SOLN
0.0000 mg | Freq: Four times a day (QID) | INTRAMUSCULAR | Status: DC
  Administered 2016-07-31: 2 mg via INTRAVENOUS
  Filled 2016-07-31: qty 1

## 2016-07-31 MED ORDER — VITAMIN B-1 100 MG PO TABS
100.0000 mg | ORAL_TABLET | Freq: Every day | ORAL | Status: DC
  Administered 2016-08-01 – 2016-08-13 (×13): 100 mg via ORAL
  Filled 2016-07-31 (×13): qty 1

## 2016-07-31 MED ORDER — THIAMINE HCL 100 MG/ML IJ SOLN
100.0000 mg | Freq: Every day | INTRAMUSCULAR | Status: DC
  Administered 2016-07-31: 100 mg via INTRAVENOUS
  Filled 2016-07-31 (×4): qty 2

## 2016-07-31 MED ORDER — IOPAMIDOL (ISOVUE-300) INJECTION 61%
INTRAVENOUS | Status: AC
  Administered 2016-07-31: 75 mL
  Filled 2016-07-31: qty 75

## 2016-07-31 MED ORDER — ASPIRIN 81 MG PO CHEW
81.0000 mg | CHEWABLE_TABLET | Freq: Every day | ORAL | Status: DC
  Administered 2016-08-01 (×2): 81 mg
  Filled 2016-07-31 (×2): qty 1

## 2016-07-31 MED ORDER — SODIUM CHLORIDE 0.9% FLUSH
3.0000 mL | Freq: Two times a day (BID) | INTRAVENOUS | Status: DC
  Administered 2016-07-31 – 2016-08-13 (×26): 3 mL via INTRAVENOUS

## 2016-07-31 MED ORDER — ACETAMINOPHEN 325 MG PO TABS
650.0000 mg | ORAL_TABLET | Freq: Four times a day (QID) | ORAL | Status: DC | PRN
  Administered 2016-08-02 – 2016-08-06 (×5): 650 mg via ORAL
  Filled 2016-07-31 (×5): qty 2

## 2016-07-31 MED ORDER — FUROSEMIDE 10 MG/ML IJ SOLN
20.0000 mg | Freq: Two times a day (BID) | INTRAMUSCULAR | Status: DC
Start: 2016-07-31 — End: 2016-08-03
  Administered 2016-08-01 – 2016-08-03 (×6): 20 mg via INTRAVENOUS
  Filled 2016-07-31 (×6): qty 2

## 2016-07-31 MED ORDER — LORAZEPAM 2 MG/ML IJ SOLN: 1.0000 mg | Freq: Four times a day (QID) | INTRAMUSCULAR | Status: AC | PRN

## 2016-07-31 MED ORDER — ONDANSETRON HCL 4 MG/2ML IJ SOLN
4.0000 mg | Freq: Four times a day (QID) | INTRAMUSCULAR | Status: DC | PRN
Start: 2016-07-31 — End: 2016-08-13

## 2016-07-31 MED ORDER — ADULT MULTIVITAMIN W/MINERALS CH
1.0000 | ORAL_TABLET | Freq: Every day | ORAL | Status: DC
  Administered 2016-08-01 – 2016-08-13 (×14): 1 via ORAL
  Filled 2016-07-31 (×14): qty 1

## 2016-07-31 MED ORDER — FOLIC ACID 1 MG PO TABS
1.0000 mg | ORAL_TABLET | Freq: Every day | ORAL | Status: DC
  Administered 2016-08-01 – 2016-08-13 (×14): 1 mg via ORAL
  Filled 2016-07-31 (×14): qty 1

## 2016-07-31 MED ORDER — ONDANSETRON HCL 4 MG PO TABS
4.0000 mg | ORAL_TABLET | Freq: Four times a day (QID) | ORAL | Status: DC | PRN
  Administered 2016-08-05: 4 mg via ORAL
  Filled 2016-07-31: qty 1

## 2016-07-31 MED ORDER — ACETAMINOPHEN 650 MG RE SUPP: 650.0000 mg | Freq: Four times a day (QID) | RECTAL | Status: DC | PRN

## 2016-07-31 MED ORDER — OXYCODONE HCL 5 MG PO TABS
10.0000 mg | ORAL_TABLET | Freq: Every day | ORAL | Status: DC | PRN
  Administered 2016-08-01 – 2016-08-05 (×5): 10 mg via ORAL
  Filled 2016-07-31 (×7): qty 2

## 2016-07-31 MED ORDER — LORAZEPAM 2 MG/ML IJ SOLN: 0.0000 mg | Freq: Two times a day (BID) | INTRAMUSCULAR | Status: DC

## 2016-07-31 MED ORDER — ATORVASTATIN CALCIUM 40 MG PO TABS
40.0000 mg | ORAL_TABLET | Freq: Every day | ORAL | Status: DC
  Administered 2016-08-01 – 2016-08-06 (×6): 40 mg
  Filled 2016-07-31 (×6): qty 1

## 2016-07-31 MED ORDER — LORAZEPAM 2 MG/ML IJ SOLN
0.0000 mg | Freq: Four times a day (QID) | INTRAMUSCULAR | Status: AC
  Administered 2016-08-01: 2 mg via INTRAVENOUS
  Filled 2016-07-31: qty 1

## 2016-07-31 NOTE — ED Notes (Signed)
Pt taken to restroom on [address] and provided a urine cup.  Pt sts "it just feels like diarrhea."

## 2016-07-31 NOTE — ED Notes (Signed)
[place] ON PHONE

## 2016-07-31 NOTE — ED Notes (Signed)
Patient taken to restroom using steady.

## 2016-07-31 NOTE — ED Notes (Signed)
ED Provider at bedside. 

## 2016-07-31 NOTE — ED Notes (Signed)
Patient transported to CT 

## 2016-07-31 NOTE — ED Notes (Signed)
Unsuccessful IV and blood draw attempt x 3.  Primary RN made aware.

## 2016-07-31 NOTE — ED Triage Notes (Addendum)
Per EMS, Pt, from home, c/o increasing abdominal distention x "over 1 week" and n/v/d x "a few days."  Pain score 8/10.  Hx of esophageal CA and previous PEG tubes.  Pt reports that he does not receive treatment for cancer.  Pt lives alone and does not have home health.

## 2016-07-31 NOTE — ED Notes (Signed)
MAIN LAB PRESENT ATTEMPTING TO DRAW BLOOD. [place]

## 2016-07-31 NOTE — ED Provider Notes (Signed)
[facility] DEPT Provider Note   CSN: [id number] Arrival date & time: 07/31/16  1128     History   Chief Complaint Chief Complaint  Patient presents with  . CA Pt  . Abdominal Pain  . Emesis    HPI Patrick [name] is a 65 y.o. male.  He is here for evaluation of abdominal swelling with pain, present for 1 month. He has a history of esophageal cancer, and that typically follows up with oncology. He missed his last visit with them in August, but was seen 4 months earlier, and felt to be in remission. He has a gastroenterologist but has not seen him recently. He continues to drink alcohol. He denies fever, chills, cough, weakness or dizziness. Came here for evaluation, by EMS. His last alcohol drink was before 07/28/2016. He denies shakiness, blurred vision, or confusion since then. There are no other modifying factors.    HPI  Past Medical History:  Diagnosis Date  . Alcoholic peripheral neuropathy ([place])   . Alcoholism ([place]) 01/02/2012   cocaine abuse, herion abuse in past  . Allergy   . Anemia   . Arthritis    knees  . Barrett's esophagus 11/17/2011   Short-segment dx 11/2011   . CAD (coronary artery disease)   . 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 Cataract    bil removed  . Chronic chest pain   . 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 Colon adenomas 11/11/11   x 5, one rectal tv adenoma  . GERD (gastroesophageal reflux disease)   . HTN (hypertension)   . Hyperlipidemia   . MI (myocardial infarction)    x3, jan, aug, nov 2011; treated at [facility] in [place]  . 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 Sexual dysfunction   . SSBE (short-segment Barrett's esophagus) 11/11/11  . Substance abuse    cocaine abuse in past  . Thrombocytopenia [facility])     Patient Active Problem List   Diagnosis Date Noted  . 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 Constipation 08/03/2015  . Cancer associated pain 08/03/2015  . Nausea with vomiting 08/03/2015  . Acute confusion   . Esophageal dysphagia 06/26/2015  . Acute encephalopathy 06/26/2015  . 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 Alcohol-induced acute pancreatitis   . Abnormal CT scan, esophagus   . Abnormal weight loss   . Dysphagia, pharyngoesophageal phase   . Fall 04/26/2015  . Numbness 04/26/2015  . Protein-calorie malnutrition, moderate ([place]) 04/26/2015  . Weakness 04/26/2015  . Abdominal pain 04/26/2015  . Pancreatitis 04/26/2015  . Alcohol abuse   . Dysphagia   . Abnormal ECG   . Essential hypertension   . Polysubstance abuse 09/03/2012  . Leukopenia and Thrombocytopenia 01/02/2012  . 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 Alcoholism ([place]) 01/02/2012  . Barrett's esophagus 11/17/2011  . History of colonic polyps 11/11/2011  . Thrombocytopenia ([place]) 03/03/2011  . Chest pain 12/03/2010  . CAD (coronary artery disease) 12/03/2010  . Hyperlipidemia 12/03/2010  . Tobacco abuse 12/03/2010  . Cocaine abuse 12/03/2010    Past Surgical History:  Procedure Laterality Date  . BALLOON ANGIOPLASTY, ARTERY  03/2010  . CARDIAC CATHETERIZATION  01/31/2011  . CATARACT EXTRACTION W/PHACO  08/18/2012   Procedure: CATARACT EXTRACTION PHACO AND INTRAOCULAR LENS PLACEMENT (IOC);  Surgeon:  Adonis Brook, MD;  Location: [facility];  Service: Ophthalmology;  Laterality: Right;  . CATARACT EXTRACTION W/PHACO Left 09/22/2012   Procedure: CATARACT EXTRACTION PHACO AND INTRAOCULAR LENS PLACEMENT (IOC);  Surgeon: Adonis Brook, MD;  Location: [facility];  Service: Ophthalmology;  Laterality: Left;  . COLONOSCOPY  11/11/11   Dr. Silvano Rusk  . CORONARY ANGIOPLASTY    . ESOPHAGOGASTRODUODENOSCOPY  11/11/11   Dr. Silvano Rusk  . ESOPHAGOGASTRODUODENOSCOPY (EGD) WITH PROPOFOL N/A  04/27/2015   Procedure: ESOPHAGOGASTRODUODENOSCOPY (EGD) WITH PROPOFOL;  Surgeon: Jerene Bears, MD;  Location: [facility] ENDOSCOPY;  Service: Endoscopy;  Laterality: N/A;  . HERNIA REPAIR     very young  . PEG PLACEMENT N/A 06/26/2015   Procedure: PERCUTANEOUS ENDOSCOPIC GASTROSTOMY (PEG) PLACEMENT;  Surgeon: Gatha Mayer, MD;  Location: [facility] ENDOSCOPY;  Service: Endoscopy;  Laterality: N/A;  . UPPER GASTROINTESTINAL ENDOSCOPY         Home Medications    Prior to Admission medications   Medication Sig Start Date End Date Taking? Authorizing Provider  aspirin 81 MG tablet Place 1 tablet (81 mg total) into feeding tube daily. 06/29/15  Yes Hosie Poisson, MD  atorvastatin (LIPITOR) 40 MG tablet Place 1 tablet (40 mg total) into feeding tube daily at 6 PM. 06/29/15  Yes Hosie Poisson, MD  lisinopril (PRINIVIL,ZESTRIL) 20 MG tablet Place 1 tablet (20 mg total) into feeding tube daily. 06/29/15  Yes Hosie Poisson, MD  Oxycodone HCl 10 MG TABS Take 10 mg by mouth daily as needed (pain).  04/16/16  Yes Historical Provider, MD  AMITIZA 24 MCG capsule Take 24 mcg by mouth 2 (two) times daily. 05/12/16   Historical Provider, MD  carvedilol (COREG) 12.5 MG tablet Place 1 tablet (12.5 mg total) into feeding tube 2 (two) times daily with a meal. Patient not taking: Reported on 07/31/2016 06/29/15   Hosie Poisson, MD  feeding supplement (BOOST / RESOURCE BREEZE) LIQD Take 1 Container by mouth 3 (three) times daily between meals. Patient not taking: Reported on 07/31/2016 04/30/15   Thurnell Lose, MD  Multiple Vitamin (MULTIVITAMIN WITH MINERALS) TABS tablet Place 1 tablet into feeding tube daily. 06/29/15   Hosie Poisson, MD  nitroGLYCERIN (NITROSTAT) 0.4 MG SL tablet Place 1 tablet (0.4 mg total) under the tongue every 5 (five) minutes as needed for chest pain. 08/06/15   Brett Canales, PA-C    Family History Family History  Problem Relation Age of Onset  . Cancer      mother's side/fathers side, 2 uncles  .  Heart attack Maternal Grandfather   . Diabetes Sister   . Stroke Sister   . Healthy Daughter   . Healthy Son   . Colon cancer Neg Hx   . Esophageal cancer Neg Hx   . Rectal cancer Neg Hx   . Stomach cancer Neg Hx     Social History Social History  Substance Use Topics  . Smoking status: Former Smoker    Packs/day: 0.10    Years: 45.00    Types: Cigarettes  . Smokeless tobacco: Never Used     Comment: 1-2 cig/day  . Alcohol use 0.0 oz/week     Comment: Pt reports drinking 2 bottles of wine tonight. "I drink a 5th of wine daily".     Allergies   Patient has no known allergies.   Review of Systems Review of Systems  All other systems reviewed and are negative.    Physical Exam Updated Vital Signs BP 137/94 (BP Location: Right  Arm)   Pulse (!) 133   Temp 99.6 F (37.6 C) (Rectal)   Resp 26   SpO2 98%   Physical Exam  Constitutional: He is oriented to person, place, and time. He appears well-developed. No distress.  He appears under nourished and older than stated age.  HENT:  Head: Normocephalic and atraumatic.  Right Ear: External ear normal.  Left Ear: External ear normal.  Eyes: Conjunctivae and EOM are normal. Pupils are equal, round, and reactive to light. Scleral icterus is present.  Neck: Normal range of motion and phonation normal. Neck supple.  Cardiovascular: Regular rhythm and normal heart sounds.   tachycardic  Pulmonary/Chest: Effort normal and breath sounds normal. No respiratory distress. He exhibits no bony tenderness.  Abdominal: Soft. He exhibits distension. He exhibits no mass. There is tenderness (diffuse, mild). There is no rebound and no guarding.  Genitourinary:  Genitourinary Comments: Anus- normal tone. Yellow stool, thin. No mass or impaction  Musculoskeletal: Normal range of motion. He exhibits edema (1+ bilateral LE). He exhibits no deformity.  Neurological: He is alert and oriented to person, place, and time. No cranial nerve  deficit or sensory deficit. He exhibits normal muscle tone. Coordination normal.  Skin: Skin is warm, dry and intact. No rash noted. No erythema.  Psychiatric: He has a normal mood and affect. His behavior is normal. Judgment and thought content normal.  Nursing note and vitals reviewed.    ED Treatments / Results  Labs (all labs ordered are listed, but only abnormal results are displayed) Labs Reviewed  COMPREHENSIVE METABOLIC PANEL - Abnormal; Notable for the following:       Result Value   Glucose, Bld 117 (*)    BUN 28 (*)    Creatinine, Ser 1.77 (*)    Calcium 8.7 (*)    Total Protein 6.4 (*)    Albumin 1.9 (*)    AST 114 (*)    Total Bilirubin 5.4 (*)    GFR calc non Af Amer 39 (*)    GFR calc Af Amer 45 (*)    All other components within normal limits  CBC - Abnormal; Notable for the following:    RBC 2.65 (*)    Hemoglobin 9.6 (*)    HCT 26.2 (*)    MCH 36.2 (*)    MCHC 36.6 (*)    RDW 20.1 (*)    Platelets 29 (*)    All other components within normal limits  PROTIME-INR - Abnormal; Notable for the following:    Prothrombin Time 23.9 (*)    All other components within normal limits  POC OCCULT BLOOD, ED - Abnormal; Notable for the following:    Fecal Occult Bld POSITIVE (*)    All other components within normal limits  LIPASE, BLOOD  ETHANOL  URINALYSIS, ROUTINE W REFLEX MICROSCOPIC  RAPID URINE DRUG SCREEN, HOSP PERFORMED    Hemoglobin  Date Value Ref Range Status  07/31/2016 9.6 (L) 13.0 - 17.0 g/dL Final  01/13/2016 11.9 (L) 13.0 - 17.0 g/dL Final   HGB  Date Value Ref Range Status  09/07/2015 10.7 (L) 13.0 - 17.1 g/dL Final  08/07/2015 9.3 (L) 13.0 - 17.1 g/dL Final  08/03/2015 8.6 (L) 13.0 - 17.1 g/dL Final  07/31/2015 8.8 (L) 13.0 - 17.1 g/dL Final   BUN  Date Value Ref Range Status  07/31/2016 28 (H) 6 - 20 mg/dL Final  01/13/2016 6 6 - 20 mg/dL Final  09/07/2015 6.2 (L) 7.0 - 26.0 mg/dL Final  08/07/2015 4.6 (L) 7.0 - 26.0 mg/dL Final    07/24/2015 6.6 (L) 7.0 - 26.0 mg/dL Final  07/16/2015 <4.0 (L) 7.0 - 26.0 mg/dL Final   Creatinine  Date Value Ref Range Status  09/07/2015 0.8 0.7 - 1.3 mg/dL Final  08/07/2015 0.8 0.7 - 1.3 mg/dL Final  07/24/2015 0.8 0.7 - 1.3 mg/dL Final  07/16/2015 0.8 0.7 - 1.3 mg/dL Final   Creatinine, Ser  Date Value Ref Range Status  07/31/2016 1.77 (H) 0.61 - 1.24 mg/dL Final  01/13/2016 0.99 0.61 - 1.24 mg/dL Final     EKG  EKG Interpretation  Date/Time:  Thursday July 31 2016 15:48:33 EST Ventricular Rate:  119 PR Interval:    QRS Duration: 59 QT Interval:  356 QTC Calculation: 501 R Axis:   -7 Text Interpretation:  Sinus tachycardia Ventricular premature complex Low voltage, precordial leads Abnormal R-wave progression, early transition Nonspecific T abnrm, anterolateral leads Prolonged QT interval Since last tracing QT has lengthened Confirmed by Eulis Foster  MD, Vira Agar ([id number]) on 07/31/2016 3:56:13 PM Also confirmed by Eulis Foster  MD, Lebaron Bautch [phone]), editor [place] CT, Leda Gauze [phone])  on 07/31/2016 4:40:37 PM       Radiology US Abdomen Complete  Result Date: 07/31/2016 CLINICAL DATA:  Abdominal pain and swelling.  Hepatitis-C. EXAM: ABDOMEN ULTRASOUND COMPLETE COMPARISON:  Ultrasound 05/16/2016 FINDINGS: Gallbladder: Small sludge within the gallbladder. Negative sonographic Murphy's sign. No distention Common bile duct: Diameter: Normal at 4 mm Liver: Liver is shrunken with a nodular contour. There is uniform increase in hepatic echogenicity. No focal hepatic lesions identified. No flow demonstrated in the portal vein. IVC: No abnormality visualized. Pancreas: Visualized portion unremarkable. Spleen: Size and appearance within normal limits. Right Kidney: Length: Atrophic, not visualized. Left Kidney: Length: 13.0 cm. Echogenicity within normal limits. No mass or hydronephrosis visualized. Abdominal aorta: No aneurysm visualized. Other findings: No ascites IMPRESSION: 1. Shrunken  nodular liver consistent with cirrhosis. No focal lesion to suggest hepatoma by ultrasound surveillance. 2. No flow demonstrated in portal vein by Doppler evaluation. Consider dedicated hepatic ultrasound vascular evaluation. These results will be called to the ordering clinician or representative by the Radiologist Assistant, and communication documented in the PACS or zVision Dashboard. Electronically Signed   By: Suzy Bouchard M.D.   On: 07/31/2016 14:32   Ct Abdomen Pelvis W Contrast  Result Date: 07/31/2016 CLINICAL DATA:  Increasing abdominal distention with nausea vomiting and diarrhea. History of esophageal cancer. EXAM: CT ABDOMEN AND PELVIS WITH CONTRAST TECHNIQUE: Multidetector CT imaging of the abdomen and pelvis was performed using the standard protocol following bolus administration of intravenous contrast. CONTRAST:  1 ISOVUE-300 IOPAMIDOL (ISOVUE-300) INJECTION 61% COMPARISON:  PET-CT 05/25/2015. FINDINGS: Lower chest:  Coronary artery calcification is evident. Hepatobiliary: Liver is markedly heterogeneous with nodular contour. There is no evidence for gallstones, gallbladder wall thickening, or pericholecystic fluid. No intrahepatic or extrahepatic biliary dilation. Pancreas: No focal mass lesion. No dilatation of the main duct. No intraparenchymal cyst. No peripancreatic edema. Spleen: No splenomegaly. No focal mass lesion. Adrenals/Urinary Tract: No adrenal nodule or mass. Right kidney is markedly atrophic with calcified lesions unchanged. Nonobstructing 3 mm stone identified interpolar left kidney which is otherwise unremarkable. No left hydroureter. Bladder is decompressed. Stomach/Bowel: Stomach is nondistended. No gastric wall thickening. No evidence of outlet obstruction. Duodenum is normally positioned as is the ligament of Treitz. No small bowel wall thickening. No small bowel dilatation. The terminal ileum is normal. The appendix is normal. No gross colonic mass. No colonic wall  thickening. No substantial diverticular change. Vascular/Lymphatic: There is abdominal aortic atherosclerosis without aneurysm. Portal vein is patent. Recanalization of the paraumbilical vein is associated with left upper abdominal vascular collateralization. There is no gastrohepatic or hepatoduodenal ligament lymphadenopathy. No intraperitoneal or retroperitoneal lymphadenopathy. No pelvic sidewall lymphadenopathy. Reproductive: The prostate gland and seminal vesicles have normal imaging features. Other: Moderate volume ascites is seen in the abdomen and pelvis. Musculoskeletal: Laxity IMPRESSION: 1. Moderate volume ascites. 2. Markedly heterogeneous liver with nodular contour and asymmetric enlargement lateral segment left lobe. Imaging features suggest cirrhosis. 3. Ultrasound exam earlier today questioned lack of flow in the portal vein although portal vein opacification is normal on this CT exam. Recanalization of the paraumbilical vein and left abdominal collateralization suggests associated portal venous hypertension. 4. Stable appearance atrophic right kidney with several calcified parenchymal lesions. Electronically Signed   By: Misty Stanley M.D.   On: 07/31/2016 17:35    Procedures Procedures (including critical care time)  Medications Ordered in ED Medications  iopamidol (ISOVUE-300) 61 % injection (75 mLs  Contrast Given 07/31/16 1653)     Initial Impression / Assessment and Plan / ED Course  I have reviewed the triage vital signs and the nursing notes.  Pertinent labs & imaging results that were available during my care of the patient were reviewed by me and considered in my medical decision making (see chart for details).  Clinical Course as of Jul 31 1740  Thu Jul 31, 2016  1634 CT Abdomen Pelvis W Contrast [EW]  6270 high Glucose: (!) 117 [EW]  1635 high BUN: (!) 28 [EW]  1635 high Creatinine: (!) 1.77 [EW]  1635 high Calcium: (!) 8.7 [EW]  1635 Total Protein: (!) 6.4 [EW]    1636 low Albumin: (!) 1.9 [EW]  1636 high Total Bilirubin: (!) 5.4 [EW]  1636 low EGFR (Non-African Amer.): (!) 39 [EW]  1637 abnormal Fecal Occult Blood, POC: (!) POSITIVE [EW]  1637 abnormal Prothrombin Time: (!) 23.9 [EW]  1637 low Hemoglobin: (!) 9.6 [EW]  1637 low Platelets: (!!) 29 [EW]    Clinical Course User Index [EW] Daleen Bo, MD    Medications  iopamidol (ISOVUE-300) 61 % injection (75 mLs  Contrast Given 07/31/16 1653)    Patient Vitals for the past 24 hrs:  BP Temp Temp src Pulse Resp SpO2  07/31/16 1628 137/94 - - (!) 133 26 98 %  07/31/16 1513 110/79 - - - 25 -  07/31/16 1205 - 99.6 F (37.6 C) Rectal - - -  07/31/16 1151 98/83 98.2 F (36.8 C) Oral (!) 128 18 95 %  07/31/16 1138 - - - - - 95 %    5:41 PM Reevaluation with update and discussion. After initial assessment and treatment, an updated evaluation reveals Patient relates that he is still uncomfortable. He is committed to avoiding alcohol. Lucious Zou L   5:47 PM-Consult complete with hospitalist.   Final Clinical Impressions(s) / ED Diagnoses   Final diagnoses:  Generalized abdominal pain  Abdominal distension  Hyperbilirubinemia  Other cirrhosis of liver (HCC)  Rectal bleeding  Anemia, unspecified type    Abdominal pain and swelling secondary to ascites. Doubt spontaneous bacterial peritonitis. Patient is a known alcoholic, with known cirrhosis. PT/INR is elevated. Total bilirubin is elevated. Hemoglobin is low, but stable consistent with prior. Small amount of blood, occult positive. Mild acute kidney injury.  Nursing Notes Reviewed/ Care Coordinated, and agree without changes. Applicable Imaging Reviewed.  Interpretation of Laboratory Data incorporated into ED treatment  Plan: Admit  New Prescriptions New Prescriptions   No medications on file     Daleen Bo, MD 08/01/16 [phone]

## 2016-07-31 NOTE — ED Notes (Signed)
EDP Patrick [name] MADE AWARE OF ELEVATED HR AND ?CIWA- AWAITING ORDERS

## 2016-07-31 NOTE — ED Notes (Signed)
Primary RN made aware of critical result.

## 2016-07-31 NOTE — ED Notes (Signed)
Admitting MD Provider at bedside. 

## 2016-07-31 NOTE — H&P (Addendum)
History and Physical    Patrick [name] [id number] DOB: [birth year] DOA: 07/31/2016  PCP: Patrick Docker, MD Consultants:  Liver Clinic; Pain Clinic Patient coming from: home - lives alone; does not have any friends or family he can name as NOK - finally named his sister, Nurse, adult Complaint: abdominal distention  HPI: Patrick [name] is a 65 y.o. male with medical history significant of CAD s/p PCI, Hep C, alcoholic cirrhosis, polysubstance abuse, thrombocytopenia, and esophageal CA s/p PEG tube placement and subsequent removal presenting with "stomach swelling up".  Has been happening for over a month now.  +n/v.  No hematemesis.  Decreased PO intake.  Had a feeding tube (for h/o esophageal cancer) but it was removed over a month ago.  Diarrhea stools for days.  2-4 stools/day.  +subjective fever.  Has chronic cough.  Last drink was before Christmas. Generally drinks 1/5 per day.  +tremors.  Visual hallucinations. No seizures.   ED Course: Per Dr. Eulis Foster: Abdominal pain and swelling secondary to ascites. Doubt spontaneous bacterial peritonitis. Patient is a known alcoholic, with known cirrhosis. PT/INR is elevated. Total bilirubin is elevated. Hemoglobin is low, but stable consistent with prior. Small amount of blood, occult positive. Mild acute kidney injury   Review of Systems: As per HPI; otherwise 10 point review of systems reviewed and negative.   Ambulatory Status:  Ambulates with a cane  Past Medical History:  Diagnosis Date  . Alcoholic peripheral neuropathy ([place])   . Alcoholism ([place]) 01/02/2012   cocaine abuse, herion abuse in past  . Allergy   . Anemia   . Arthritis    knees  . Barrett's esophagus 11/17/2011   Short-segment dx 11/2011   . CAD (coronary artery disease)   . Cancer of thoracic esophagus ([place])   . Cataract    bil removed  . Chronic chest pain   . Chronic hepatitis C with cirrhosis ([place]) 01/02/2012  . Colon adenomas 11/11/11   x 5, one rectal tv adenoma    . GERD (gastroesophageal reflux disease)   . HTN (hypertension)   . Hyperlipidemia   . MI (myocardial infarction)    x3, jan, aug, nov 2011; treated at [facility] in [place]  . 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 Sexual dysfunction   . SSBE (short-segment Barrett's esophagus) 11/11/11  . Substance abuse    cocaine abuse in past  . Thrombocytopenia ([place])     Past Surgical History:  Procedure Laterality Date  . BALLOON ANGIOPLASTY, ARTERY  03/2010  . CARDIAC CATHETERIZATION  01/31/2011  . CATARACT EXTRACTION W/PHACO  08/18/2012   Procedure: CATARACT EXTRACTION PHACO AND INTRAOCULAR LENS PLACEMENT (IOC);  Surgeon: Adonis Brook, MD;  Location: [facility];  Service: Ophthalmology;  Laterality: Right;  . CATARACT EXTRACTION W/PHACO Left 09/22/2012   Procedure: CATARACT EXTRACTION PHACO AND INTRAOCULAR LENS PLACEMENT (IOC);  Surgeon: Adonis Brook, MD;  Location: [facility];  Service: Ophthalmology;  Laterality: Left;  . COLONOSCOPY  11/11/11   Dr. Silvano Rusk  . CORONARY ANGIOPLASTY    . ESOPHAGOGASTRODUODENOSCOPY  11/11/11   Dr. Silvano Rusk  . ESOPHAGOGASTRODUODENOSCOPY (EGD) WITH PROPOFOL N/A 04/27/2015   Procedure: ESOPHAGOGASTRODUODENOSCOPY (EGD) WITH PROPOFOL;  Surgeon: Jerene Bears, MD;  Location: [facility] ENDOSCOPY;  Service: Endoscopy;  Laterality: N/A;  . HERNIA REPAIR     very young  . PEG PLACEMENT N/A 06/26/2015   Procedure: PERCUTANEOUS ENDOSCOPIC GASTROSTOMY (PEG) PLACEMENT;  Surgeon: Gatha Mayer, MD;  Location: [facility] ENDOSCOPY;  Service: Endoscopy;  Laterality: N/A;  .  UPPER GASTROINTESTINAL ENDOSCOPY      Social History   Social History  . Marital status: Single    Spouse name: N/A  . Number of children: 3  . Years of education: N/A   Occupational History  . disabled     Unemployed   Social History Main Topics  . Smoking status: Former Smoker    Packs/day: 0.10    Years: 45.00    Types: Cigarettes    Quit date: 07/27/2016  . Smokeless tobacco: Never Used     Comment: 1-2  cig/day  . Alcohol use 0.0 oz/week     Comment: 1/5 daily, stopped 07/27/16  . Drug use: No     Comment: Cocaine + 08/2012. Previous heroin, cocaine and marijuana abuse.   Marland Kitchen Sexual activity: Not Currently   Other Topics Concern  . Not on file   Social History Narrative   Single, lives alone   Poor social support-depends on others for transportation   Fall Risk-ambulates w/cane. Has rolling walker at home   Chronic depression    No Known Allergies  Family History  Problem Relation Age of Onset  . Cancer      mother's side/fathers side, 2 uncles  . Heart attack Maternal Grandfather   . Diabetes Sister   . Stroke Sister   . Healthy Daughter   . Healthy Son   . Colon cancer Neg Hx   . Esophageal cancer Neg Hx   . Rectal cancer Neg Hx   . Stomach cancer Neg Hx     Prior to Admission medications   Medication Sig Start Date End Date Taking? Authorizing Provider  aspirin 81 MG tablet Place 1 tablet (81 mg total) into feeding tube daily. 06/29/15  Yes Hosie Poisson, MD  atorvastatin (LIPITOR) 40 MG tablet Place 1 tablet (40 mg total) into feeding tube daily at 6 PM. 06/29/15  Yes Hosie Poisson, MD  lisinopril (PRINIVIL,ZESTRIL) 20 MG tablet Place 1 tablet (20 mg total) into feeding tube daily. 06/29/15  Yes Hosie Poisson, MD  Oxycodone HCl 10 MG TABS Take 10 mg by mouth daily as needed (pain).  04/16/16  Yes Historical Provider, MD  AMITIZA 24 MCG capsule Take 24 mcg by mouth 2 (two) times daily. 05/12/16   Historical Provider, MD  carvedilol (COREG) 12.5 MG tablet Place 1 tablet (12.5 mg total) into feeding tube 2 (two) times daily with a meal. Patient not taking: Reported on 07/31/2016 06/29/15   Hosie Poisson, MD  feeding supplement (BOOST / RESOURCE BREEZE) LIQD Take 1 Container by mouth 3 (three) times daily between meals. Patient not taking: Reported on 07/31/2016 04/30/15   Thurnell Lose, MD  Multiple Vitamin (MULTIVITAMIN WITH MINERALS) TABS tablet Place 1 tablet into feeding  tube daily. 06/29/15   Hosie Poisson, MD  nitroGLYCERIN (NITROSTAT) 0.4 MG SL tablet Place 1 tablet (0.4 mg total) under the tongue every 5 (five) minutes as needed for chest pain. 08/06/15   Brett Canales, PA-C    Physical Exam: Vitals:   07/31/16 1937 07/31/16 2000 07/31/16 2053 08/01/16 0001  BP: 92/70 92/73 (!) 119/93 119/78  Pulse: (!) 121 114 (!) 125 (!) 114  Resp: 21 24 20 20   Temp:   97.9 F (36.6 C) 98.1 F (36.7 C)  TempSrc:   Oral Oral  SpO2: 92% 91% 96% 95%  Weight:      Height:         General: Appears calm and comfortable and is NAD; he is really  non-communicative Eyes:  PERRL, EOMI, normal lids, iris, +scleral icterus ENT:  grossly normal hearing, lips & tongue, mmm Neck:  no LAD, masses or thyromegaly Cardiovascular:  RRR, no m/r/g. No LE edema.  Respiratory:  CTA bilaterally, no w/r/r. Normal respiratory effort. Abdomen:  Marked distention, +fluid wave, generalized tenderness to palpation Skin:  no rash or induration seen on limited exam Musculoskeletal:  grossly normal tone BUE/BLE, good ROM, no bony abnormality Psychiatric:  grossly normal mood and affect, speech fluent and appropriate, AOx3 Neurologic:  CN 2-12 grossly intact, moves all extremities in coordinated fashion, sensation intact  Labs on Admission: I have personally reviewed following labs and imaging studies  CBC:  Recent Labs Lab 07/31/16 1451  WBC 5.0  HGB 9.6*  HCT 26.2*  MCV 98.9  PLT 29*   Basic Metabolic Panel:  Recent Labs Lab 07/31/16 1451  NA 138  K 4.2  CL 103  CO2 25  GLUCOSE 117*  BUN 28*  CREATININE 1.77*  CALCIUM 8.7*   GFR: Estimated Creatinine Clearance: 38.9 mL/min (by C-G formula based on SCr of 1.77 mg/dL (H)). Liver Function Tests:  Recent Labs Lab 07/31/16 1451  AST 114*  ALT 46  ALKPHOS 85  BILITOT 5.4*  PROT 6.4*  ALBUMIN 1.9*    Recent Labs Lab 07/31/16 1451  LIPASE 30   No results for input(s): AMMONIA in the last 168  hours. Coagulation Profile:  Recent Labs Lab 07/31/16 1451  INR 2.10   Cardiac Enzymes: No results for input(s): CKTOTAL, CKMB, CKMBINDEX, TROPONINI in the last 168 hours. BNP (last 3 results) No results for input(s): PROBNP in the last 8760 hours. HbA1C: No results for input(s): HGBA1C in the last 72 hours. CBG: No results for input(s): GLUCAP in the last 168 hours. Lipid Profile: No results for input(s): CHOL, HDL, LDLCALC, TRIG, CHOLHDL, LDLDIRECT in the last 72 hours. Thyroid Function Tests: No results for input(s): TSH, T4TOTAL, FREET4, T3FREE, THYROIDAB in the last 72 hours. Anemia Panel: No results for input(s): VITAMINB12, FOLATE, FERRITIN, TIBC, IRON, RETICCTPCT in the last 72 hours. Urine analysis:    Component Value Date/Time   COLORURINE AMBER (A) 07/31/2016 1835   APPEARANCEUR CLEAR 07/31/2016 1835   LABSPEC >1.046 (H) 07/31/2016 1835   PHURINE 6.0 07/31/2016 1835   GLUCOSEU NEGATIVE 07/31/2016 [place] NEGATIVE 07/31/2016 [place] 07/31/2016 [place] 07/31/2016 [place] NEGATIVE 07/31/2016 1835   UROBILINOGEN 1.0 04/26/2015 0934   NITRITE NEGATIVE 07/31/2016 1835   LEUKOCYTESUR NEGATIVE 07/31/2016 1835    Creatinine Clearance: Estimated Creatinine Clearance: 38.9 mL/min (by C-G formula based on SCr of 1.77 mg/dL (H)).  Sepsis Labs: @LABRCNTIP (procalcitonin:4,lacticidven:4) )No results found for this or any previous visit (from the past 240 hour(s)).   Radiological Exams on Admission: US Abdomen Complete  Result Date: 07/31/2016 CLINICAL DATA:  Abdominal pain and swelling.  Hepatitis-C. EXAM: ABDOMEN ULTRASOUND COMPLETE COMPARISON:  Ultrasound 05/16/2016 FINDINGS: Gallbladder: Small sludge within the gallbladder. Negative sonographic Murphy's sign. No distention Common bile duct: Diameter: Normal at 4 mm Liver: Liver is shrunken with a nodular contour. There is uniform increase in hepatic echogenicity. No  focal hepatic lesions identified. No flow demonstrated in the portal vein. IVC: No abnormality visualized. Pancreas: Visualized portion unremarkable. Spleen: Size and appearance within normal limits. Right Kidney: Length: Atrophic, not visualized. Left Kidney: Length: 13.0 cm. Echogenicity within normal limits. No mass or hydronephrosis visualized. Abdominal aorta: No aneurysm visualized. Other findings: No ascites IMPRESSION:  1. Shrunken nodular liver consistent with cirrhosis. No focal lesion to suggest hepatoma by ultrasound surveillance. 2. No flow demonstrated in portal vein by Doppler evaluation. Consider dedicated hepatic ultrasound vascular evaluation. These results will be called to the ordering clinician or representative by the Radiologist Assistant, and communication documented in the PACS or zVision Dashboard. Electronically Signed   By: Suzy Bouchard M.D.   On: 07/31/2016 14:32   Ct Abdomen Pelvis W Contrast  Result Date: 07/31/2016 CLINICAL DATA:  Increasing abdominal distention with nausea vomiting and diarrhea. History of esophageal cancer. EXAM: CT ABDOMEN AND PELVIS WITH CONTRAST TECHNIQUE: Multidetector CT imaging of the abdomen and pelvis was performed using the standard protocol following bolus administration of intravenous contrast. CONTRAST:  1 ISOVUE-300 IOPAMIDOL (ISOVUE-300) INJECTION 61% COMPARISON:  PET-CT 05/25/2015. FINDINGS: Lower chest:  Coronary artery calcification is evident. Hepatobiliary: Liver is markedly heterogeneous with nodular contour. There is no evidence for gallstones, gallbladder wall thickening, or pericholecystic fluid. No intrahepatic or extrahepatic biliary dilation. Pancreas: No focal mass lesion. No dilatation of the main duct. No intraparenchymal cyst. No peripancreatic edema. Spleen: No splenomegaly. No focal mass lesion. Adrenals/Urinary Tract: No adrenal nodule or mass. Right kidney is markedly atrophic with calcified lesions unchanged. Nonobstructing  3 mm stone identified interpolar left kidney which is otherwise unremarkable. No left hydroureter. Bladder is decompressed. Stomach/Bowel: Stomach is nondistended. No gastric wall thickening. No evidence of outlet obstruction. Duodenum is normally positioned as is the ligament of Treitz. No small bowel wall thickening. No small bowel dilatation. The terminal ileum is normal. The appendix is normal. No gross colonic mass. No colonic wall thickening. No substantial diverticular change. Vascular/Lymphatic: There is abdominal aortic atherosclerosis without aneurysm. Portal vein is patent. Recanalization of the paraumbilical vein is associated with left upper abdominal vascular collateralization. There is no gastrohepatic or hepatoduodenal ligament lymphadenopathy. No intraperitoneal or retroperitoneal lymphadenopathy. No pelvic sidewall lymphadenopathy. Reproductive: The prostate gland and seminal vesicles have normal imaging features. Other: Moderate volume ascites is seen in the abdomen and pelvis. Musculoskeletal: Laxity IMPRESSION: 1. Moderate volume ascites. 2. Markedly heterogeneous liver with nodular contour and asymmetric enlargement lateral segment left lobe. Imaging features suggest cirrhosis. 3. Ultrasound exam earlier today questioned lack of flow in the portal vein although portal vein opacification is normal on this CT exam. Recanalization of the paraumbilical vein and left abdominal collateralization suggests associated portal venous hypertension. 4. Stable appearance atrophic right kidney with several calcified parenchymal lesions. Electronically Signed   By: Misty Stanley M.D.   On: 07/31/2016 17:35    EKG: Independently reviewed.  Sinus tachycardia with rate 119; PVCs, nonspecific ST changes with no evidence of acute ischemia, prolonged QT interval (slightly >500)  Assessment/Plan Principal Problem:   Acute liver failure Active Problems:   Tobacco abuse   Thrombocytopenia (HCC)   Prolonged  QT interval   Ascites   Acute renal failure (HCC)   Melena   Protein-calorie malnutrition, severe (HCC)   Alcohol withdrawal (HCC)   Acute liver failure with ascites -Patient with known h/o alcoholic cirrhosis and ongoing ETOH dependence presenting with new-onset ascites, heme positive stools, and worsening LFTs (AST 114, ALT 46; Bili 5.4 (0.9 in 6/17); INR 2.10 -Concern for fulminant hepatic failure and possibly hepatorenal syndrome -His MELD score is 27, MELD-Na 28, with a mortality rate of 19.6%. -Lesser concern for SBP, but will plan for IR-directed paracentesis tomorrow (if able to do so with elevated INR - but a literature review indicates that attempting to reverse with vitamin  K is probably counterproductive and likely ineffective) -Patient is likely to need GI consultation -Will attempt to gently diurese with Lasix, but will hold additional Spironolactone and aggressive diuresis for now -Based on his extremely low albumin, he is likely to need albumin infusion at the time of paracentesis  Acute renal failure -Creatinine 1.77 (0.99 in 6/17) -Will attempt gentle diuresis and follow  Melena -Heme positive -Hgb 9.6 (11.9 in 6/17) -Will trend  Thrombocytopenia -Chronic, but slightly worse than usual -Platelets 29 (53 in 6/17) -Does not require transfusion at this time - although if he has active bloody stools then he may need a transfusion given his platelets are <50  Prolonged QT -Will need to judiciously use medications which may increase the QT interval -Likely related to hepatorenal insufficiency  Alcohol withdrawal -Patient with chronic ETOH dependence -Has failed rehab long ago -Reports last drink was before Christmas -He is at ongoing increased risk for complications of withdrawal including seizures, DTs -CIWA protocol  Malnutrition -Nutrition consult -Albumin 1.9 (previously 3.5 in 6/15)  Tobacco dependence -Encourage cessation.  This was discussed with the  patient and should be reviewed on an ongoing basis.   -Patch ordered.   DVT prophylaxis: SCDs Code Status: Full - confirmed with patient Family Communication: None present Disposition Plan:  Home once clinically improved Consults called: IR; consider GI in AM  Admission status: Admit - It is my clinical opinion that admission to INPATIENT is reasonable and necessary because this patient will require at least 2 midnights in the hospital to treat this condition based on the medical complexity of the problems presented.  Given the aforementioned information, the predictability of an adverse outcome is felt to be significant.     Karmen Bongo MD [facility]  If 7PM-7AM, please contact night-coverage [email or web address] Password [facility]  08/01/2016, 12:58 AM

## 2016-08-01 ENCOUNTER — Encounter (HOSPITAL_COMMUNITY): Payer: Self-pay

## 2016-08-01 ENCOUNTER — Inpatient Hospital Stay (HOSPITAL_COMMUNITY): Payer: Medicare HMO

## 2016-08-01 DIAGNOSIS — N179 Acute kidney failure, unspecified: Secondary | ICD-10-CM | POA: Diagnosis present

## 2016-08-01 DIAGNOSIS — F10939 Alcohol use, unspecified with withdrawal, unspecified: Secondary | ICD-10-CM | POA: Diagnosis present

## 2016-08-01 DIAGNOSIS — K921 Melena: Secondary | ICD-10-CM | POA: Diagnosis present

## 2016-08-01 DIAGNOSIS — K7200 Acute and subacute hepatic failure without coma: Secondary | ICD-10-CM | POA: Diagnosis present

## 2016-08-01 DIAGNOSIS — F10239 Alcohol dependence with withdrawal, unspecified: Secondary | ICD-10-CM | POA: Diagnosis present

## 2016-08-01 DIAGNOSIS — K7031 Alcoholic cirrhosis of liver with ascites: Secondary | ICD-10-CM | POA: Diagnosis present

## 2016-08-01 DIAGNOSIS — E43 Unspecified severe protein-calorie malnutrition: Secondary | ICD-10-CM | POA: Diagnosis present

## 2016-08-01 LAB — CBC
HEMATOCRIT: 24.4 % — AB (ref 39.0–52.0)
HEMOGLOBIN: 9.3 g/dL — AB (ref 13.0–17.0)
MCH: 37.2 pg — ABNORMAL HIGH (ref 26.0–34.0)
MCHC: 38.1 g/dL — ABNORMAL HIGH (ref 30.0–36.0)
MCV: 97.6 fL (ref 78.0–100.0)
Platelets: 27 10*3/uL — CL (ref 150–400)
RBC: 2.5 MIL/uL — ABNORMAL LOW (ref 4.22–5.81)
RDW: 20.8 % — AB (ref 11.5–15.5)
WBC: 4.5 10*3/uL (ref 4.0–10.5)

## 2016-08-01 LAB — BASIC METABOLIC PANEL
Anion gap: 9 (ref 5–15)
BUN: 32 mg/dL — AB (ref 6–20)
CO2: 25 mmol/L (ref 22–32)
Calcium: 8.5 mg/dL — ABNORMAL LOW (ref 8.9–10.3)
Chloride: 102 mmol/L (ref 101–111)
Creatinine, Ser: 1.93 mg/dL — ABNORMAL HIGH (ref 0.61–1.24)
GFR calc Af Amer: 40 mL/min — ABNORMAL LOW (ref >60)
GFR calc non Af Amer: 35 mL/min — ABNORMAL LOW (ref >60)
Glucose, Bld: 103 mg/dL — ABNORMAL HIGH (ref 65–99)
Potassium: 3.9 mmol/L (ref 3.5–5.1)
SODIUM: 136 mmol/L (ref 135–145)

## 2016-08-01 LAB — BODY FLUID CELL COUNT WITH DIFFERENTIAL
Lymphs, Fluid: 37 %
Monocyte-Macrophage-Serous Fluid: 55 % (ref 50–90)
Neutrophil Count, Fluid: 8 % (ref 0–25)
Total Nucleated Cell Count, Fluid: 66 cu mm (ref 0–1000)

## 2016-08-01 LAB — GRAM STAIN

## 2016-08-01 LAB — PROTIME-INR
INR: 2.19
Prothrombin Time: 24.7 seconds — ABNORMAL HIGH (ref 11.4–15.2)

## 2016-08-01 LAB — ALBUMIN, FLUID (OTHER)

## 2016-08-01 LAB — PROTEIN, BODY FLUID: Total protein, fluid: <3 g/dL

## 2016-08-01 MED ORDER — ENSURE ENLIVE PO LIQD
237.0000 mL | Freq: Two times a day (BID) | ORAL | Status: DC
  Administered 2016-08-02 – 2016-08-05 (×6): 237 mL via ORAL

## 2016-08-01 MED ORDER — NICOTINE 21 MG/24HR TD PT24
21.0000 mg | MEDICATED_PATCH | Freq: Every day | TRANSDERMAL | Status: DC
  Administered 2016-08-01 – 2016-08-13 (×13): 21 mg via TRANSDERMAL
  Filled 2016-08-01 (×13): qty 1

## 2016-08-01 MED ORDER — ORAL CARE MOUTH RINSE
15.0000 mL | Freq: Two times a day (BID) | OROMUCOSAL | Status: DC
  Administered 2016-08-02 – 2016-08-13 (×13): 15 mL via OROMUCOSAL

## 2016-08-01 MED ORDER — CHLORHEXIDINE GLUCONATE 0.12 % MT SOLN
15.0000 mL | Freq: Two times a day (BID) | OROMUCOSAL | Status: DC
  Administered 2016-08-01 – 2016-08-13 (×21): 15 mL via OROMUCOSAL
  Filled 2016-08-01 (×17): qty 15

## 2016-08-01 NOTE — Progress Notes (Signed)
PT Cancellation Note  Patient Details Name: Patrick [name] MRN: [id number] DOB: 1950/11/16   Cancelled Treatment:    Reason Eval/Treat Not Completed: Fatigue/lethargy limiting ability to participate  RN reports pt has been lethargic this morning and just now remaining awake.  RN does not believe pt ready for PT today and requests to check back tomorrow.   Dorian Duval,KATHrine E 08/01/2016, 12:25 PM Carmelia Bake, PT, DPT 08/01/2016 Pager: [phone]

## 2016-08-01 NOTE — Progress Notes (Addendum)
Initial Nutrition Assessment  DOCUMENTATION CODES:   Severe malnutrition in context of chronic illness  INTERVENTION:   Ensure Enlive po BID, each supplement provides 350 kcal and 20 grams of protein  Magic cup TID with meals, each supplement provides 290 kcal and 9 grams of protein  MVI daily   NUTRITION DIAGNOSIS:   Malnutrition related to chronic illness as evidenced by severe depletion of body fat, severe depletion of muscle mass.  GOAL:   Patient will meet greater than or equal to 90% of their needs  MONITOR:   PO intake, Supplement acceptance  REASON FOR ASSESSMENT:   Malnutrition Screening Tool, Consult Assessment of nutrition requirement/status  ASSESSMENT:   65 y.o. male with medical history significant of CAD s/p PCI, Hep C, alcoholic cirrhosis, polysubstance abuse, thrombocytopenia, and esophageal CA s/p PEG tube placement and subsequent removal presenting with "stomach swelling up".  Has been happening for over a month now.  +n/v.  No hematemesis.  Decreased PO intake.  Had a feeding tube (for h/o esophageal cancer) but it was removed over a month ago.  Diarrhea stools for days.  2-4 stools/day.  +subjective fever.    Met with pt in room today. Pt lethargic and not communicating. Pt has h/o esophageal cancer w/ recent PEG tube that was pulled ~11/2 months ago. Unable to obtain why PEG was pulled or if pt was eating well at time PEG was removed. Pt currently NPO for paracentesis today. 3L removed. Pt has gained ~30lbs since 08/2015 this could possible be r/t ascites. Pt with melena today.   Medications reviewed and include: aspirin, folic acid, lasix, nicotine, thiamine, oxycodone  Labs reviewed: BUN 32(H), creat 1.93(H), Ca 8.5(L) adj. 10.18 wnl, alb 1.9(L), AST 114(H), tbili 5.4(H)  Nutrition-Focused physical exam completed. Findings are severe fat depletion, severe muscle depletion, and ascites.   Diet Order:  Diet NPO time specified  Skin:  Reviewed, no  issues  Last BM:  12/29- melena  Height:   Ht Readings from Last 1 Encounters:  07/31/16 '5\' 7"'$  (1.702 m)    Weight:   Wt Readings from Last 1 Encounters:  08/01/16 180 lb 12.4 oz (82 kg)    Ideal Body Weight:  67.2 kg  BMI:  Body mass index is 28.31 kg/m.  Estimated Nutritional Needs:   Kcal:  2000-2300kcal/day   Protein:  94-108g/day   Fluid:  2L/day   EDUCATION NEEDS:   No education needs identified at this time  Koleen Distance, RD, LDN Pager #[phone]

## 2016-08-01 NOTE — Progress Notes (Signed)
Patient gave permission to give medical updates and information to his sister Venancio Hasman.  Barbee Shropshire. Brigitte Pulse, RN

## 2016-08-01 NOTE — Procedures (Signed)
Ultrasound-guided diagnostic and therapeutic paracentesis performed yielding 3 liters of yellow colored fluid. No immediate complications.  [name] E 11:17 AM 08/01/2016

## 2016-08-01 NOTE — Progress Notes (Signed)
PROGRESS NOTE    Patrick [name]  [id number] DOB: [birth year] DOA: 07/31/2016 PCP: Javier Docker, MD    Brief Narrative:  65 y.o. male with medical history significant of CAD s/p PCI, Hep C, alcoholic cirrhosis, polysubstance abuse, thrombocytopenia, and esophageal CA s/p PEG tube placement and subsequent removal presenting with "stomach swelling up".  Has been happening for over a month now.  +n/v.  No hematemesis.  Decreased PO intake.  Had a feeding tube (for h/o esophageal cancer) but it was removed over a month ago.  Diarrhea stools for days.  2-4 stools/day.  +subjective fever.  Has chronic cough.  Last drink was before Christmas. Generally drinks 1/5 per day.  +tremors.  Visual hallucinations. No seizures. In the ED, patient noted to be ascitic with complaints of abd pains.  Assessment & Plan:   Principal Problem:   Acute liver failure Active Problems:   Tobacco abuse   Thrombocytopenia (HCC)   Prolonged QT interval   Ascites   Acute renal failure (HCC)   Melena   Protein-calorie malnutrition, severe (HCC)   Alcohol withdrawal (HCC)   Acute liver failure with ascites -Patient with known h/o alcoholic cirrhosis and ongoing ETOH dependence presenting with new-onset ascites, heme positive stools, and worsening LFTs (AST 114, ALT 46; Bili 5.4 (0.9 in 6/17); INR 2.10 at time of presentation -His MELD score is 27, MELD-Na 28, with a mortality rate of 19.6%. -Patient underwent US guided paracentesis on 12/29. Fluid analysis reviewed. Not suggestive of SBP with 66 WBC's -Will continue to gently diurese with Lasix, but will hold additional Spironolactone and aggressive diuresis for now -Repeat CMP in AM  Acute renal failure -Creatinine 1.77 (0.99 in 6/17) -Will attempt gentle diuresis -Repeat renal function in AM  Melena -Heme positive in ED -Hgb 9.3 (11.9 in 6/17) -INR 2.19, plts of 27 currently -Currently hemodynamically stable. -Will repeat CBC in AM. If notable  trend down in hgb, or if fulminant bleed, then would consult GI  Thrombocytopenia -Chronic, but slightly worse than usual -Platelets 27 (53 in 6/17) -Repeat CBC in AM  Prolonged QT -Will need to judiciously use medications which may increase the QT interval -Continue on tele  Alcohol withdrawal -Patient with chronic ETOH dependence -Has failed rehab long ago -Reports last drink was before Christmas -He is at ongoing increased risk for complications of withdrawal including seizures, DTs -Will continue CIWA  Malnutrition -Nutrition consulted -Albumin 1.9 (previously 3.5 in 6/15)  Tobacco dependence -Encourage cessation.  T -Nicotine patch ordered  DVT prophylaxis: SCD's Code Status: Full Family Communication: Pt in room, family not at bedside Disposition Plan: Uncertain at this time  Consultants:     Procedures:     Antimicrobials: Anti-infectives    None       Subjective: No complaints this AM  Objective: Vitals:   08/01/16 1030 08/01/16 1040 08/01/16 1051 08/01/16 1345  BP: 99/87 103/79 108/81 102/73  Pulse:    (!) 111  Resp:    20  Temp:    99.2 F (37.3 C)  TempSrc:    Oral  SpO2:    94%  Weight:      Height:        Intake/Output Summary (Last 24 hours) at 08/01/16 1453 Last data filed at 08/01/16 0534  Gross per 24 hour  Intake                0 ml  Output  551 ml  Net             -551 ml   Filed Weights   07/31/16 1914 08/01/16 0500  Weight: 78.9 kg (173 lb 15.1 oz) 82 kg (180 lb 12.4 oz)    Examination:  General exam: Appears calm and comfortable  Respiratory system: Clear to auscultation. Respiratory effort normal. Cardiovascular system: S1 & S2 heard, RRR Gastrointestinal system: distended, ascitic, pos BS Central nervous system: Asleep, arousable. No focal neurological deficits. Extremities: Symmetric 5 x 5 power. Skin: No rashes, lesions Psychiatry: Judgement and insight appear normal. Mood & affect  appropriate.   Data Reviewed: I have personally reviewed following labs and imaging studies  CBC:  Recent Labs Lab 07/31/16 1451 08/01/16 0347  WBC 5.0 4.5  HGB 9.6* 9.3*  HCT 26.2* 24.4*  MCV 98.9 97.6  PLT 29* 27*   Basic Metabolic Panel:  Recent Labs Lab 07/31/16 1451 08/01/16 0347  NA 138 136  K 4.2 3.9  CL 103 102  CO2 25 25  GLUCOSE 117* 103*  BUN 28* 32*  CREATININE 1.77* 1.93*  CALCIUM 8.7* 8.5*   GFR: Estimated Creatinine Clearance: 39.1 mL/min (by C-G formula based on SCr of 1.93 mg/dL (H)). Liver Function Tests:  Recent Labs Lab 07/31/16 1451  AST 114*  ALT 46  ALKPHOS 85  BILITOT 5.4*  PROT 6.4*  ALBUMIN 1.9*    Recent Labs Lab 07/31/16 1451  LIPASE 30   No results for input(s): AMMONIA in the last 168 hours. Coagulation Profile:  Recent Labs Lab 07/31/16 1451 08/01/16 0347  INR 2.10 2.19   Cardiac Enzymes: No results for input(s): CKTOTAL, CKMB, CKMBINDEX, TROPONINI in the last 168 hours. BNP (last 3 results) No results for input(s): PROBNP in the last 8760 hours. HbA1C: No results for input(s): HGBA1C in the last 72 hours. CBG: No results for input(s): GLUCAP in the last 168 hours. Lipid Profile: No results for input(s): CHOL, HDL, LDLCALC, TRIG, CHOLHDL, LDLDIRECT in the last 72 hours. Thyroid Function Tests: No results for input(s): TSH, T4TOTAL, FREET4, T3FREE, THYROIDAB in the last 72 hours. Anemia Panel: No results for input(s): VITAMINB12, FOLATE, FERRITIN, TIBC, IRON, RETICCTPCT in the last 72 hours. Sepsis Labs: No results for input(s): PROCALCITON, LATICACIDVEN in the last 168 hours.  No results found for this or any previous visit (from the past 240 hour(s)).   Radiology Studies: US Abdomen Complete  Result Date: 07/31/2016 CLINICAL DATA:  Abdominal pain and swelling.  Hepatitis-C. EXAM: ABDOMEN ULTRASOUND COMPLETE COMPARISON:  Ultrasound 05/16/2016 FINDINGS: Gallbladder: Small sludge within the gallbladder.  Negative sonographic Murphy's sign. No distention Common bile duct: Diameter: Normal at 4 mm Liver: Liver is shrunken with a nodular contour. There is uniform increase in hepatic echogenicity. No focal hepatic lesions identified. No flow demonstrated in the portal vein. IVC: No abnormality visualized. Pancreas: Visualized portion unremarkable. Spleen: Size and appearance within normal limits. Right Kidney: Length: Atrophic, not visualized. Left Kidney: Length: 13.0 cm. Echogenicity within normal limits. No mass or hydronephrosis visualized. Abdominal aorta: No aneurysm visualized. Other findings: No ascites IMPRESSION: 1. Shrunken nodular liver consistent with cirrhosis. No focal lesion to suggest hepatoma by ultrasound surveillance. 2. No flow demonstrated in portal vein by Doppler evaluation. Consider dedicated hepatic ultrasound vascular evaluation. These results will be called to the ordering clinician or representative by the Radiologist Assistant, and communication documented in the PACS or zVision Dashboard. Electronically Signed   By: Suzy Bouchard M.D.   On: 07/31/2016 14:32  Ct Abdomen Pelvis W Contrast  Result Date: 07/31/2016 CLINICAL DATA:  Increasing abdominal distention with nausea vomiting and diarrhea. History of esophageal cancer. EXAM: CT ABDOMEN AND PELVIS WITH CONTRAST TECHNIQUE: Multidetector CT imaging of the abdomen and pelvis was performed using the standard protocol following bolus administration of intravenous contrast. CONTRAST:  1 ISOVUE-300 IOPAMIDOL (ISOVUE-300) INJECTION 61% COMPARISON:  PET-CT 05/25/2015. FINDINGS: Lower chest:  Coronary artery calcification is evident. Hepatobiliary: Liver is markedly heterogeneous with nodular contour. There is no evidence for gallstones, gallbladder wall thickening, or pericholecystic fluid. No intrahepatic or extrahepatic biliary dilation. Pancreas: No focal mass lesion. No dilatation of the main duct. No intraparenchymal cyst. No  peripancreatic edema. Spleen: No splenomegaly. No focal mass lesion. Adrenals/Urinary Tract: No adrenal nodule or mass. Right kidney is markedly atrophic with calcified lesions unchanged. Nonobstructing 3 mm stone identified interpolar left kidney which is otherwise unremarkable. No left hydroureter. Bladder is decompressed. Stomach/Bowel: Stomach is nondistended. No gastric wall thickening. No evidence of outlet obstruction. Duodenum is normally positioned as is the ligament of Treitz. No small bowel wall thickening. No small bowel dilatation. The terminal ileum is normal. The appendix is normal. No gross colonic mass. No colonic wall thickening. No substantial diverticular change. Vascular/Lymphatic: There is abdominal aortic atherosclerosis without aneurysm. Portal vein is patent. Recanalization of the paraumbilical vein is associated with left upper abdominal vascular collateralization. There is no gastrohepatic or hepatoduodenal ligament lymphadenopathy. No intraperitoneal or retroperitoneal lymphadenopathy. No pelvic sidewall lymphadenopathy. Reproductive: The prostate gland and seminal vesicles have normal imaging features. Other: Moderate volume ascites is seen in the abdomen and pelvis. Musculoskeletal: Laxity IMPRESSION: 1. Moderate volume ascites. 2. Markedly heterogeneous liver with nodular contour and asymmetric enlargement lateral segment left lobe. Imaging features suggest cirrhosis. 3. Ultrasound exam earlier today questioned lack of flow in the portal vein although portal vein opacification is normal on this CT exam. Recanalization of the paraumbilical vein and left abdominal collateralization suggests associated portal venous hypertension. 4. Stable appearance atrophic right kidney with several calcified parenchymal lesions. Electronically Signed   By: Misty Stanley M.D.   On: 07/31/2016 17:35   US Paracentesis  Result Date: 08/01/2016 INDICATION: Patient with a history of hepatitis-C and  alcoholic cirrhosis. Ascites present. Request is made for diagnostic and therapeutic paracentesis. EXAM: ULTRASOUND GUIDED DIAGNOSTIC AND THERAPEUTIC PARACENTESIS MEDICATIONS: 1% lidocaine. COMPLICATIONS: None immediate. PROCEDURE: Informed written consent was obtained from the patient after a discussion of the risks, benefits and alternatives to treatment. A timeout was performed prior to the initiation of the procedure. Initial ultrasound scanning demonstrates a moderate amount of ascites within the left lower abdominal quadrant. The left lower abdomen was prepped and draped in the usual sterile fashion. 1% lidocaine was used for local anesthesia. Following this, a 19 gauge, 7-cm, Yueh catheter was introduced. An ultrasound image was saved for documentation purposes. The paracentesis was performed. The catheter was removed and a dressing was applied. The patient tolerated the procedure well without immediate post procedural complication. FINDINGS: A total of approximately 3 L of clear yellow fluid was removed. Samples were sent to the laboratory as requested by the clinical team. IMPRESSION: Successful ultrasound-guided paracentesis yielding 3 liters of peritoneal fluid. Read by: Saverio Danker, PA-C Electronically Signed   By: Lucrezia Europe M.D.   On: 08/01/2016 11:19    Scheduled Meds: . aspirin  81 mg Per Tube Daily  . atorvastatin  40 mg Per Tube q1800  . chlorhexidine  15 mL Mouth Rinse BID  . feeding  supplement (ENSURE ENLIVE)  237 mL Oral BID BM  . folic acid  1 mg Oral Daily  . furosemide  20 mg Intravenous Q12H  . LORazepam  0-4 mg Intravenous Q6H   Followed by  . [START ON 08/02/2016] LORazepam  0-4 mg Intravenous Q12H  . mouth rinse  15 mL Mouth Rinse q12n4p  . multivitamin with minerals  1 tablet Oral Daily  . nicotine  21 mg Transdermal Daily  . sodium chloride flush  3 mL Intravenous Q12H  . thiamine  100 mg Oral Daily   Or  . thiamine  100 mg Intravenous Daily   Continuous  Infusions:   LOS: 1 day   CHIU, Orpah Melter, MD [facility] Pager [phone]  If 7PM-7AM, please contact night-coverage [email or web address] Password [facility] 08/01/2016, 2:53 PM

## 2016-08-02 LAB — COMPREHENSIVE METABOLIC PANEL
ALBUMIN: 1.9 g/dL — AB (ref 3.5–5.0)
ALK PHOS: 94 U/L (ref 38–126)
ALT: 45 U/L (ref 17–63)
ANION GAP: 8 (ref 5–15)
AST: 120 U/L — AB (ref 15–41)
BILIRUBIN TOTAL: 3 mg/dL — AB (ref 0.3–1.2)
BUN: 41 mg/dL — AB (ref 6–20)
CALCIUM: 8.3 mg/dL — AB (ref 8.9–10.3)
CO2: 26 mmol/L (ref 22–32)
Chloride: 99 mmol/L — ABNORMAL LOW (ref 101–111)
Creatinine, Ser: 1.88 mg/dL — ABNORMAL HIGH (ref 0.61–1.24)
GFR calc Af Amer: 42 mL/min — ABNORMAL LOW (ref >60)
GFR calc non Af Amer: 36 mL/min — ABNORMAL LOW (ref >60)
GLUCOSE: 119 mg/dL — AB (ref 65–99)
Potassium: 3.4 mmol/L — ABNORMAL LOW (ref 3.5–5.1)
SODIUM: 133 mmol/L — AB (ref 135–145)
TOTAL PROTEIN: 5.9 g/dL — AB (ref 6.5–8.1)

## 2016-08-02 LAB — CBC
HEMATOCRIT: 24.4 % — AB (ref 39.0–52.0)
HEMOGLOBIN: 9.3 g/dL — AB (ref 13.0–17.0)
MCH: 37.5 pg — ABNORMAL HIGH (ref 26.0–34.0)
MCHC: 38.1 g/dL — AB (ref 30.0–36.0)
MCV: 98.4 fL (ref 78.0–100.0)
Platelets: 35 10*3/uL — ABNORMAL LOW (ref 150–400)
RBC: 2.48 MIL/uL — AB (ref 4.22–5.81)
RDW: 21.2 % — ABNORMAL HIGH (ref 11.5–15.5)
WBC: 4.7 10*3/uL (ref 4.0–10.5)

## 2016-08-02 LAB — AMMONIA: Ammonia: 41 umol/L — ABNORMAL HIGH (ref 9–35)

## 2016-08-02 MED ORDER — RIFAXIMIN 550 MG PO TABS
550.0000 mg | ORAL_TABLET | Freq: Two times a day (BID) | ORAL | Status: DC
  Administered 2016-08-02 – 2016-08-05 (×6): 550 mg via ORAL
  Filled 2016-08-02 (×7): qty 1

## 2016-08-02 MED ORDER — ASPIRIN 81 MG PO CHEW
81.0000 mg | CHEWABLE_TABLET | ORAL | Status: DC
  Administered 2016-08-02 – 2016-08-03 (×2): 81 mg
  Filled 2016-08-02 (×2): qty 1

## 2016-08-02 MED ORDER — POTASSIUM CHLORIDE CRYS ER 20 MEQ PO TBCR
40.0000 meq | EXTENDED_RELEASE_TABLET | Freq: Once | ORAL | Status: AC
  Administered 2016-08-02: 40 meq via ORAL
  Filled 2016-08-02: qty 2

## 2016-08-02 NOTE — Progress Notes (Signed)
PROGRESS NOTE    Patrick [name]  [id number] DOB: [birth year] DOA: 07/31/2016 PCP: Javier Docker, MD    Brief Narrative:  65 y.o. male with medical history significant of CAD s/p PCI, Hep C, alcoholic cirrhosis, polysubstance abuse, thrombocytopenia, and esophageal CA s/p PEG tube placement and subsequent removal presenting with "stomach swelling up".  Has been happening for over a month now.  +n/v.  No hematemesis.  Decreased PO intake.  Had a feeding tube (for h/o esophageal cancer) but it was removed over a month ago.  Diarrhea stools for days.  2-4 stools/day.  +subjective fever.  Has chronic cough.  Last drink was before Christmas. Generally drinks 1/5 per day.  +tremors.  Visual hallucinations. No seizures. In the ED, patient noted to be ascitic with complaints of abd pains.  Assessment & Plan:   Principal Problem:   Acute liver failure Active Problems:   Tobacco abuse   Thrombocytopenia (HCC)   Prolonged QT interval   Ascites   Acute renal failure (HCC)   Melena   Protein-calorie malnutrition, severe (HCC)   Alcohol withdrawal (HCC)   Acute liver failure with ascites -Patient with known h/o alcoholic cirrhosis and ongoing ETOH dependence presenting with new-onset ascites, heme positive stools, and worsening LFTs (AST 114, ALT 46; Bili 5.4 (0.9 in 6/17); INR 2.10 at time of presentation -His MELD score is 27, MELD-Na 28, with a mortality rate of 19.6%. -Patient underwent US guided paracentesis on 12/29. Fluid analysis reviewed. Not suggestive of SBP with 66 WBC's -Will continue to gently diurese with Lasix, but will hold additional Spironolactone and aggressive diuresis for now -Repeat CMP in AM -ammonia elevate-- add xifaxam  Acute renal failure Cr was 0.99 in 6/17 -Will attempt gentle diuresis CMP daily  Melena -Heme positive in ED -Hgb 9.3 (11.9 in 6/17) -INR 2.19, plts of 27 currently -Currently hemodynamically stable. -Will repeat CBC in AM. If notable  trend down in hgb, or if fulminant bleed, then would consult GI  Thrombocytopenia -Chronic, but slightly worse than usual -Platelets 35 (53 in 6/17) -Repeat CBC in AM  Prolonged QT -Will need to judiciously use medications which may increase the QT interval -Continue on tele  Alcohol withdrawal -Patient with chronic ETOH dependence -Has failed rehab long ago -Reports last drink was before Christmas -He is at ongoing increased risk for complications of withdrawal including seizures, DTs -Will continue CIWA  Malnutrition -Nutrition consulted -Albumin 1.9 (previously 3.5 in 6/15)  Tobacco dependence -Encourage cessation.  T -Nicotine patch ordered  H/o feeding tube and dysphagia from esophageal cancer SLP eval   DVT prophylaxis: SCD's Code Status: Full Family Communication: Pt in room Disposition Plan: poor overall prognosis-- Uncertain d/c at this time  Consultants:     Procedures:     Antimicrobials: Anti-infectives    Start     Dose/Rate Route Frequency Ordered Stop   08/02/16 1345  rifaximin (XIFAXAN) tablet 550 mg     550 mg Oral 2 times daily 08/02/16 1335        Subjective: Some tremors this AM Says his abd is sore  Objective: Vitals:   08/01/16 2109 08/02/16 0500 08/02/16 0557 08/02/16 1207  BP: 99/77  128/78 90/60  Pulse: (!) 116  (!) 109 (!) 112  Resp: 20  20 19   Temp: 99.5 F (37.5 C)  98.4 F (36.9 C) 98.3 F (36.8 C)  TempSrc: Oral  Oral Oral  SpO2: 97%  95% 93%  Weight:  79.3 kg (174 lb 13.2 oz)  Height:        Intake/Output Summary (Last 24 hours) at 08/02/16 1339 Last data filed at 08/02/16 0558  Gross per 24 hour  Intake              340 ml  Output              300 ml  Net               40 ml   Filed Weights   07/31/16 1914 08/01/16 0500 08/02/16 0500  Weight: 78.9 kg (173 lb 15.1 oz) 82 kg (180 lb 12.4 oz) 79.3 kg (174 lb 13.2 oz)    Examination:  General exam: ill appearing male  Respiratory system: Clear to  auscultation. Respiratory effort normal. Cardiovascular system: S1 & S2 heard, RRR Gastrointestinal system: no rebound tenderness/gaurding, pos BS Central nervous system: Asleep, arousable. No focal neurological deficits. Extremities: Symmetric 5 x 5 power.   Data Reviewed: I have personally reviewed following labs and imaging studies  CBC:  Recent Labs Lab 07/31/16 1451 08/01/16 0347 08/02/16 0423  WBC 5.0 4.5 4.7  HGB 9.6* 9.3* 9.3*  HCT 26.2* 24.4* 24.4*  MCV 98.9 97.6 98.4  PLT 29* 27* 35*   Basic Metabolic Panel:  Recent Labs Lab 07/31/16 1451 08/01/16 0347 08/02/16 0706  NA 138 136 133*  K 4.2 3.9 3.4*  CL 103 102 99*  CO2 25 25 26   GLUCOSE 117* 103* 119*  BUN 28* 32* 41*  CREATININE 1.77* 1.93* 1.88*  CALCIUM 8.7* 8.5* 8.3*   GFR: Estimated Creatinine Clearance: 36.6 mL/min (by C-G formula based on SCr of 1.88 mg/dL (H)). Liver Function Tests:  Recent Labs Lab 07/31/16 1451 08/02/16 0706  AST 114* 120*  ALT 46 45  ALKPHOS 85 94  BILITOT 5.4* 3.0*  PROT 6.4* 5.9*  ALBUMIN 1.9* 1.9*    Recent Labs Lab 07/31/16 1451  LIPASE 30    Recent Labs Lab 08/02/16 1005  AMMONIA 41*   Coagulation Profile:  Recent Labs Lab 07/31/16 1451 08/01/16 0347  INR 2.10 2.19   Cardiac Enzymes: No results for input(s): CKTOTAL, CKMB, CKMBINDEX, TROPONINI in the last 168 hours. BNP (last 3 results) No results for input(s): PROBNP in the last 8760 hours. HbA1C: No results for input(s): HGBA1C in the last 72 hours. CBG: No results for input(s): GLUCAP in the last 168 hours. Lipid Profile: No results for input(s): CHOL, HDL, LDLCALC, TRIG, CHOLHDL, LDLDIRECT in the last 72 hours. Thyroid Function Tests: No results for input(s): TSH, T4TOTAL, FREET4, T3FREE, THYROIDAB in the last 72 hours. Anemia Panel: No results for input(s): VITAMINB12, FOLATE, FERRITIN, TIBC, IRON, RETICCTPCT in the last 72 hours. Sepsis Labs: No results for input(s): PROCALCITON,  LATICACIDVEN in the last 168 hours.  Recent Results (from the past 240 hour(s))  Culture, body fluid-bottle     Status: None (Preliminary result)   Collection Time: 08/01/16 10:38 AM  Result Value Ref Range Status   Specimen Description FLUID PERITONEAL  Final   Special Requests NONE  Final   Culture   Final    NO GROWTH < 24 HOURS Performed at [facility]    Report Status PENDING  Incomplete  Gram stain     Status: None   Collection Time: 08/01/16 10:38 AM  Result Value Ref Range Status   Specimen Description FLUID PERITONEAL  Final   Special Requests NONE  Final   Gram Stain   Final    RARE WBC PRESENT,BOTH PMN  AND MONONUCLEAR NO ORGANISMS SEEN Performed at [facility]    Report Status 08/01/2016 FINAL  Final     Radiology Studies: US Abdomen Complete  Result Date: 07/31/2016 CLINICAL DATA:  Abdominal pain and swelling.  Hepatitis-C. EXAM: ABDOMEN ULTRASOUND COMPLETE COMPARISON:  Ultrasound 05/16/2016 FINDINGS: Gallbladder: Small sludge within the gallbladder. Negative sonographic Murphy's sign. No distention Common bile duct: Diameter: Normal at 4 mm Liver: Liver is shrunken with a nodular contour. There is uniform increase in hepatic echogenicity. No focal hepatic lesions identified. No flow demonstrated in the portal vein. IVC: No abnormality visualized. Pancreas: Visualized portion unremarkable. Spleen: Size and appearance within normal limits. Right Kidney: Length: Atrophic, not visualized. Left Kidney: Length: 13.0 cm. Echogenicity within normal limits. No mass or hydronephrosis visualized. Abdominal aorta: No aneurysm visualized. Other findings: No ascites IMPRESSION: 1. Shrunken nodular liver consistent with cirrhosis. No focal lesion to suggest hepatoma by ultrasound surveillance. 2. No flow demonstrated in portal vein by Doppler evaluation. Consider dedicated hepatic ultrasound vascular evaluation. These results will be called to the ordering clinician or  representative by the Radiologist Assistant, and communication documented in the PACS or zVision Dashboard. Electronically Signed   By: Suzy Bouchard M.D.   On: 07/31/2016 14:32   Ct Abdomen Pelvis W Contrast  Result Date: 07/31/2016 CLINICAL DATA:  Increasing abdominal distention with nausea vomiting and diarrhea. History of esophageal cancer. EXAM: CT ABDOMEN AND PELVIS WITH CONTRAST TECHNIQUE: Multidetector CT imaging of the abdomen and pelvis was performed using the standard protocol following bolus administration of intravenous contrast. CONTRAST:  1 ISOVUE-300 IOPAMIDOL (ISOVUE-300) INJECTION 61% COMPARISON:  PET-CT 05/25/2015. FINDINGS: Lower chest:  Coronary artery calcification is evident. Hepatobiliary: Liver is markedly heterogeneous with nodular contour. There is no evidence for gallstones, gallbladder wall thickening, or pericholecystic fluid. No intrahepatic or extrahepatic biliary dilation. Pancreas: No focal mass lesion. No dilatation of the main duct. No intraparenchymal cyst. No peripancreatic edema. Spleen: No splenomegaly. No focal mass lesion. Adrenals/Urinary Tract: No adrenal nodule or mass. Right kidney is markedly atrophic with calcified lesions unchanged. Nonobstructing 3 mm stone identified interpolar left kidney which is otherwise unremarkable. No left hydroureter. Bladder is decompressed. Stomach/Bowel: Stomach is nondistended. No gastric wall thickening. No evidence of outlet obstruction. Duodenum is normally positioned as is the ligament of Treitz. No small bowel wall thickening. No small bowel dilatation. The terminal ileum is normal. The appendix is normal. No gross colonic mass. No colonic wall thickening. No substantial diverticular change. Vascular/Lymphatic: There is abdominal aortic atherosclerosis without aneurysm. Portal vein is patent. Recanalization of the paraumbilical vein is associated with left upper abdominal vascular collateralization. There is no gastrohepatic  or hepatoduodenal ligament lymphadenopathy. No intraperitoneal or retroperitoneal lymphadenopathy. No pelvic sidewall lymphadenopathy. Reproductive: The prostate gland and seminal vesicles have normal imaging features. Other: Moderate volume ascites is seen in the abdomen and pelvis. Musculoskeletal: Laxity IMPRESSION: 1. Moderate volume ascites. 2. Markedly heterogeneous liver with nodular contour and asymmetric enlargement lateral segment left lobe. Imaging features suggest cirrhosis. 3. Ultrasound exam earlier today questioned lack of flow in the portal vein although portal vein opacification is normal on this CT exam. Recanalization of the paraumbilical vein and left abdominal collateralization suggests associated portal venous hypertension. 4. Stable appearance atrophic right kidney with several calcified parenchymal lesions. Electronically Signed   By: Misty Stanley M.D.   On: 07/31/2016 17:35   US Paracentesis  Result Date: 08/01/2016 INDICATION: Patient with a history of hepatitis-C and alcoholic cirrhosis. Ascites present. Request is made  for diagnostic and therapeutic paracentesis. EXAM: ULTRASOUND GUIDED DIAGNOSTIC AND THERAPEUTIC PARACENTESIS MEDICATIONS: 1% lidocaine. COMPLICATIONS: None immediate. PROCEDURE: Informed written consent was obtained from the patient after a discussion of the risks, benefits and alternatives to treatment. A timeout was performed prior to the initiation of the procedure. Initial ultrasound scanning demonstrates a moderate amount of ascites within the left lower abdominal quadrant. The left lower abdomen was prepped and draped in the usual sterile fashion. 1% lidocaine was used for local anesthesia. Following this, a 19 gauge, 7-cm, Yueh catheter was introduced. An ultrasound image was saved for documentation purposes. The paracentesis was performed. The catheter was removed and a dressing was applied. The patient tolerated the procedure well without immediate post  procedural complication. FINDINGS: A total of approximately 3 L of clear yellow fluid was removed. Samples were sent to the laboratory as requested by the clinical team. IMPRESSION: Successful ultrasound-guided paracentesis yielding 3 liters of peritoneal fluid. Read by: Saverio Danker, PA-C Electronically Signed   By: Lucrezia Europe M.D.   On: 08/01/2016 11:19    Scheduled Meds: . aspirin  81 mg Per Tube Q24H  . atorvastatin  40 mg Per Tube q1800  . chlorhexidine  15 mL Mouth Rinse BID  . feeding supplement (ENSURE ENLIVE)  237 mL Oral BID BM  . folic acid  1 mg Oral Daily  . furosemide  20 mg Intravenous Q12H  . LORazepam  0-4 mg Intravenous Q6H   Followed by  . LORazepam  0-4 mg Intravenous Q12H  . mouth rinse  15 mL Mouth Rinse q12n4p  . multivitamin with minerals  1 tablet Oral Daily  . nicotine  21 mg Transdermal Daily  . rifaximin  550 mg Oral BID  . sodium chloride flush  3 mL Intravenous Q12H  . thiamine  100 mg Oral Daily   Or  . thiamine  100 mg Intravenous Daily   Continuous Infusions:   LOS: 2 days   [place], DO [facility] Pager [phone]  If 7PM-7AM, please contact night-coverage [email or web address] Password [email or web address] 08/02/2016, 1:39 PM

## 2016-08-02 NOTE — Progress Notes (Signed)
Occupational Therapy Evaluation Patient Details Name: Patrick [name] MRN: [id number] DOB: 03-22-1951 Today's Date: 08/02/2016    History of Present Illness  65 y.o. male with medical history significant of CAD s/p PCI, Hep C, alcoholic cirrhosis, polysubstance abuse, thrombocytopenia, and esophageal CA s/p PEG tube placement and subsequent removal; adm 07/31/16 with abdominal distention   Clinical Impression   Patient presents to OT with decreased ADL independence and safety due to the deficits listed below. Will benefit from skilled OT to maximize function and to facilitate a safe discharge. OT will follow.    Follow Up Recommendations  SNF;Supervision/Assistance - 24 hour    Equipment Recommendations  Other (comment) (tbd next venue of care)    Recommendations for Other Services       Precautions / Restrictions Precautions Precautions: Fall Restrictions Weight Bearing Restrictions: No      Mobility Bed Mobility Overal bed mobility: Needs Assistance Bed Mobility: Supine to Sit;Sit to Supine     Supine to sit: Mod assist;+2 for physical assistance;+2 for safety/equipment Sit to supine: +2 for physical assistance;+2 for safety/equipment;Mod assist   General bed mobility comments: assist for trunk and bil LEs, incr time/decr initiation  Transfers Overall transfer level: Needs assistance Equipment used: Rolling walker (2 wheeled) Transfers: Sit to/from Stand Sit to Stand: Mod assist;+2 physical assistance;+2 safety/equipment         General transfer comment: multi-modal cues for st shift to stand, safety, hand placement    Balance Overall balance assessment: Needs assistance Sitting-balance support: Bilateral upper extremity supported;Feet supported Sitting balance-Leahy Scale: Poor Sitting balance - Comments: multi-directional LOB with cues and assist to maintain midline   Standing balance support: Bilateral upper extremity supported Standing balance-Leahy  Scale: Poor                              ADL Overall ADL's : Needs assistance/impaired Eating/Feeding: Minimal assistance;Bed level Eating/Feeding Details (indicate cue type and reason): states spillage due to tremors Grooming: Wash/dry face;Wash/dry hands;Set up;Bed level   Upper Body Bathing: Maximal assistance;Bed level   Lower Body Bathing: Total assistance;Bed level             Toilet Transfer Details (indicate cue type and reason): only was able to attempt standing during eval         Functional mobility during ADLs: Moderate assistance;+2 for physical assistance;+2 for safety/equipment;Rolling walker General ADL Comments: Patient c/o pain B feet that began when sheets were taken off of him just prior to mobilizing. He reports he has neuropathy. Noted BUE and BLE tremors/jerking affecting ability to mobilize and perform ADLs. Patient performed bed mobility and standing to RW but reported unable to try to take steps.     Vision     Perception     Praxis      Pertinent Vitals/Pain Pain Assessment: 0-10 Pain Score: 9  Pain Location: B feet Pain Descriptors / Indicators: Aching;Sore Pain Intervention(s): Limited activity within patient's tolerance;Monitored during session;Repositioned     Hand Dominance     Extremity/Trunk Assessment Upper Extremity Assessment Upper Extremity Assessment: Generalized weakness (also has tremors/jerking bilaterally )   Lower Extremity Assessment Lower Extremity Assessment: Defer to PT evaluation RLE Deficits / Details: BLEs with tremors/jerking during mobility tasks       Communication Communication Communication: No difficulties   Cognition Arousal/Alertness: Lethargic Behavior During Therapy: Flat affect Overall Cognitive Status: Impaired/Different from baseline Area of Impairment: Following commands;Problem solving  Following Commands: Follows one step commands inconsistently     Problem Solving:  Slow processing;Decreased initiation;Difficulty sequencing;Requires verbal cues;Requires tactile cues     General Comments       Exercises       Shoulder Instructions      Home Living Family/patient expects to be discharged to:: Private residence Living Arrangements: Alone   Type of Home: Apartment Home Access: Stairs to enter Entrance Stairs-Number of Steps: 6   Home Layout: One level     Bathroom Shower/Tub: Occupational psychologist: Standard     Home Equipment: Cane - single point;Walker - 2 wheels          Prior Functioning/Environment Level of Independence: Independent with assistive device(s)        Comments: drives, does own errands        OT Problem List: Decreased strength;Decreased range of motion;Decreased activity tolerance;Impaired balance (sitting and/or standing);Impaired vision/perception;Decreased cognition;Decreased safety awareness;Decreased knowledge of use of DME or AE;Decreased knowledge of precautions;Pain   OT Treatment/Interventions: Self-care/ADL training;Therapeutic exercise;DME and/or AE instruction;Therapeutic activities;Patient/family education    OT Goals(Current goals can be found in the care plan section) Acute Rehab OT Goals Patient Stated Goal: none stated OT Goal Formulation: With patient Time For Goal Achievement: 08/16/16 Potential to Achieve Goals: Good ADL Goals Pt Will Perform Grooming: with set-up;sitting Pt Will Perform Upper Body Bathing: with min assist;sitting Pt Will Perform Lower Body Bathing: with min assist;sit to/from stand Pt Will Perform Upper Body Dressing: with min assist;sitting Pt Will Perform Lower Body Dressing: with mod assist;sit to/from stand Pt Will Transfer to Toilet: with min assist;bedside commode Pt Will Perform Toileting - Clothing Manipulation and hygiene: with min assist;sit to/from stand  OT Frequency: Min 2X/week   Barriers to D/C: Decreased caregiver support  lives alone, no  one to assist per patient       Co-evaluation PT/OT/SLP Co-Evaluation/Treatment: Yes Reason for Co-Treatment: For patient/therapist safety PT goals addressed during session: Mobility/safety with mobility OT goals addressed during session: ADL's and self-care      End of Session Equipment Utilized During Treatment: Rolling walker;Gait belt Nurse Communication: Mobility status  Activity Tolerance: Patient limited by pain;Patient limited by fatigue Patient left: in bed;with call bell/phone within reach;with bed alarm set   Time: [id number] OT Time Calculation (min): 29 min Charges:  OT General Charges $OT Visit: 1 Procedure OT Evaluation $OT Eval Moderate Complexity: 1 Procedure G-Codes:    Kermitt Harjo A 08-24-2016, 2:08 PM

## 2016-08-02 NOTE — Evaluation (Signed)
Physical Therapy Evaluation Patient Details Name: Patrick [name] MRN: [id number] DOB: [birth year] Today's Date: 08/02/2016   History of Present Illness   65 y.o. male with medical history significant of CAD s/p PCI, Hep C, alcoholic cirrhosis, polysubstance abuse, thrombocytopenia, and esophageal CA s/p PEG tube placement and subsequent removal; adm 07/31/16 with abdominal distention  Clinical Impression  Pt admitted with above diagnosis. Pt currently with functional limitations due to the deficits listed below (see PT Problem List).  Pt will benefit from skilled PT to increase their independence and safety with mobility to allow discharge to the venue listed below.  Pt will likely need SNF, will continue to follow     Follow Up Recommendations SNF    Equipment Recommendations  None recommended by PT    Recommendations for Other Services       Precautions / Restrictions Precautions Precautions: Fall Restrictions Weight Bearing Restrictions: No      Mobility  Bed Mobility Overal bed mobility: Needs Assistance Bed Mobility: Supine to Sit;Sit to Supine     Supine to sit: Mod assist;+2 for physical assistance;+2 for safety/equipment Sit to supine: +2 for physical assistance;+2 for safety/equipment;Mod assist   General bed mobility comments: assist for trunk and bil LEs, incr time/decr initiation  Transfers Overall transfer level: Needs assistance Equipment used: Rolling walker (2 wheeled) Transfers: Sit to/from Stand Sit to Stand: Mod assist;+2 physical assistance;+2 safety/equipment         General transfer comment: multi-modal cues for st shift to stand, safety, hand placement  Ambulation/Gait             General Gait Details: unable d/t incr tremors  Stairs            Wheelchair Mobility    Modified Rankin (Stroke Patients Only)       Balance Overall balance assessment: Needs assistance Sitting-balance support: Bilateral upper extremity  supported;Feet supported Sitting balance-Leahy Scale: Poor Sitting balance - Comments: multi-directional LOB with cues and assist to maintain midline   Standing balance support: Bilateral upper extremity supported Standing balance-Leahy Scale: Poor                               Pertinent Vitals/Pain Pain Assessment: 0-10 Pain Score: 9  Pain Location: B feet Pain Descriptors / Indicators: Aching;Sore Pain Intervention(s): Limited activity within patient's tolerance    Home Living Family/patient expects to be discharged to:: Private residence Living Arrangements: Alone   Type of Home: Apartment Home Access: Stairs to enter   [employer] of Steps: 6 Home Layout: One level Home Equipment: [place] - single point;Walker - 2 wheels      Prior Function Level of Independence: Independent with assistive device(s)         Comments: drives, does own errands     Hand Dominance        Extremity/Trunk Assessment   Upper Extremity Assessment Upper Extremity Assessment: Defer to OT evaluation    Lower Extremity Assessment Lower Extremity Assessment: RLE deficits/detail;LLE deficits/detail RLE Deficits / Details: AROM grossly WFL per functional observation; bil LEs tremulous during attempts to mobilize       Communication   Communication: No difficulties  Cognition Arousal/Alertness: Lethargic Behavior During Therapy: Flat affect Overall Cognitive Status: Impaired/Different from baseline Area of Impairment: Following commands;Problem solving       Following Commands: Follows one step commands inconsistently     Problem Solving: Slow processing;Decreased initiation;Difficulty sequencing;Requires verbal cues;Requires tactile cues  General Comments      Exercises     Assessment/Plan    PT Assessment Patient needs continued PT services  PT Problem List Decreased strength;Decreased activity tolerance;Decreased balance;Decreased knowledge  of use of DME;Decreased mobility;Decreased safety awareness          PT Treatment Interventions DME instruction;Gait training;Functional mobility training;Therapeutic exercise;Therapeutic activities;Patient/family education    PT Goals (Current goals can be found in the Care Plan section)  Acute Rehab PT Goals Patient Stated Goal: none stated PT Goal Formulation: Patient unable to participate in goal setting Time For Goal Achievement: 08/09/16 Potential to Achieve Goals: Fair    Frequency Min 3X/week   Barriers to discharge        Co-evaluation PT/OT/SLP Co-Evaluation/Treatment: Yes Reason for Co-Treatment: For patient/therapist safety PT goals addressed during session: Mobility/safety with mobility;Proper use of DME;Balance         End of Session Equipment Utilized During Treatment: Gait belt Activity Tolerance: Patient limited by fatigue Patient left: in bed;with call bell/phone within reach;with bed alarm set           Time: [id number] PT Time Calculation (min) (ACUTE ONLY): 29 min   Charges:   PT Evaluation $PT Eval Moderate Complexity: 1 Procedure     PT G Codes:        Maya Arcand Aug 25, 2016, 1:44 PM

## 2016-08-03 LAB — COMPREHENSIVE METABOLIC PANEL
ALK PHOS: 110 U/L (ref 38–126)
ALT: 56 U/L (ref 17–63)
ANION GAP: 9 (ref 5–15)
AST: 148 U/L — ABNORMAL HIGH (ref 15–41)
Albumin: 2.1 g/dL — ABNORMAL LOW (ref 3.5–5.0)
BILIRUBIN TOTAL: 3.4 mg/dL — AB (ref 0.3–1.2)
BUN: 40 mg/dL — ABNORMAL HIGH (ref 6–20)
CALCIUM: 8.8 mg/dL — AB (ref 8.9–10.3)
CO2: 26 mmol/L (ref 22–32)
Chloride: 101 mmol/L (ref 101–111)
Creatinine, Ser: 1.67 mg/dL — ABNORMAL HIGH (ref 0.61–1.24)
GFR, EST AFRICAN AMERICAN: 48 mL/min — AB (ref >60)
GFR, EST NON AFRICAN AMERICAN: 41 mL/min — AB (ref >60)
Glucose, Bld: 123 mg/dL — ABNORMAL HIGH (ref 65–99)
POTASSIUM: 3.8 mmol/L (ref 3.5–5.1)
Sodium: 136 mmol/L (ref 135–145)
TOTAL PROTEIN: 7.2 g/dL (ref 6.5–8.1)

## 2016-08-03 LAB — CBC
HCT: 26.6 % — ABNORMAL LOW (ref 39.0–52.0)
HEMOGLOBIN: 10 g/dL — AB (ref 13.0–17.0)
MCH: 37.6 pg — ABNORMAL HIGH (ref 26.0–34.0)
MCHC: 37.6 g/dL — AB (ref 30.0–36.0)
MCV: 100 fL (ref 78.0–100.0)
Platelets: 41 10*3/uL — ABNORMAL LOW (ref 150–400)
RBC: 2.66 MIL/uL — AB (ref 4.22–5.81)
RDW: 21.4 % — ABNORMAL HIGH (ref 11.5–15.5)
WBC: 5.2 10*3/uL (ref 4.0–10.5)

## 2016-08-03 MED ORDER — PNEUMOCOCCAL VAC POLYVALENT 25 MCG/0.5ML IJ INJ
0.5000 mL | INJECTION | INTRAMUSCULAR | Status: DC
  Filled 2016-08-03 (×2): qty 0.5

## 2016-08-03 MED ORDER — FUROSEMIDE 10 MG/ML IJ SOLN
20.0000 mg | Freq: Every day | INTRAMUSCULAR | Status: DC
  Administered 2016-08-04 – 2016-08-05 (×2): 20 mg via INTRAVENOUS
  Filled 2016-08-03 (×2): qty 2

## 2016-08-03 NOTE — Progress Notes (Signed)
PROGRESS NOTE    Patrick [name]  [id number] DOB: March 07, 1951 DOA: 07/31/2016 PCP: Javier Docker, MD    Brief Narrative:  65 y.o. male with medical history significant of CAD s/p PCI, Hep C, alcoholic cirrhosis, polysubstance abuse, thrombocytopenia, and esophageal CA s/p PEG tube placement and subsequent removal presenting with "stomach swelling up".  Has been happening for over a month now.  +n/v.  No hematemesis.  Decreased PO intake.  Had a feeding tube (for h/o esophageal cancer) but it was removed over a month ago.  Diarrhea stools for days.  2-4 stools/day.  +subjective fever.  Has chronic cough.  Last drink was before Christmas. Generally drinks 1/5 per day.  +tremors.  Visual hallucinations. No seizures. In the ED, patient noted to be ascitic with complaints of abd pains.  Assessment & Plan:   Principal Problem:   Acute liver failure Active Problems:   Tobacco abuse   Thrombocytopenia (HCC)   Prolonged QT interval   Ascites   Acute renal failure (HCC)   Melena   Protein-calorie malnutrition, severe (HCC)   Alcohol withdrawal (HCC)   Acute liver failure with ascites -Patient with known h/o alcoholic cirrhosis and ongoing ETOH dependence presenting with new-onset ascites, heme positive stools, and worsening LFTs (AST 114, ALT 46; Bili 5.4 (0.9 in 6/17); INR 2.10 at time of presentation -His MELD score is 27, MELD-Na 28, with a mortality rate of 19.6%. -Patient underwent US guided paracentesis on 12/29. Fluid analysis reviewed. Not suggestive of SBP with 66 WBC's -Will continue to gently diurese with Lasix, but will hold additional Spironolactone and aggressive diuresis for now -Repeat CMP in AM -ammonia slightly elevated-- add xifaxam  Acute renal failure Cr was 0.99 in 6/17 -Will attempt gentle diuresis CMP daily  Melena -Heme positive in ED -Hgb 9.3 (11.9 in 6/17) -INR 2.19, plts of 27 currently -Currently hemodynamically stable. -Will repeat CBC in AM. If  notable trend down in hgb, or if fulminant bleed, then would consult GI  Thrombocytopenia -Chronic -Platelets 35 (53 in 6/17) -Repeat CBC in AM  Prolonged QT -Will need to judiciously use medications which may increase the QT interval -Continue on tele  Alcohol withdrawal -Patient with chronic ETOH dependence - failed rehab long ago -Reports last drink was before Christmas -He is at ongoing increased risk for complications of withdrawal including seizures, DTs -Will continue CIWA  Malnutrition -Nutrition consulted -Albumin 1.9 (previously 3.5 in 6/15)  Tobacco dependence -Encourage cessation.  -Nicotine patch ordered  H/o feeding tube and dysphagia from esophageal cancer SLP eval   DVT prophylaxis: SCD's Code Status: Full Family Communication: Pt in room Disposition Plan: poor overall prognosis-- Uncertain d/c at this time  Consultants:     Procedures:     Antimicrobials: Anti-infectives    Start     Dose/Rate Route Frequency Ordered Stop   08/02/16 1500  rifaximin (XIFAXAN) tablet 550 mg     550 mg Oral 2 times daily 08/02/16 1335        Subjective: Some tremors this AM Says his abd is sore  Objective: Vitals:   08/02/16 1207 08/02/16 1805 08/02/16 2045 08/03/16 0607  BP: 90/60 119/87 119/80 108/80  Pulse: (!) 112 (!) 118 (!) 117 (!) 125  Resp: 19 (!) 21 20 20   Temp: 98.3 F (36.8 C) 98.3 F (36.8 C) 99.4 F (37.4 C) 98.9 F (37.2 C)  TempSrc: Oral Oral Oral Oral  SpO2: 93% 93% 93% 94%  Weight:    79.1 kg (174 lb  6.1 oz)  Height:       No intake or output data in the 24 hours ending 08/03/16 1111 Filed Weights   08/01/16 0500 08/02/16 0500 08/03/16 [id number]  Weight: 82 kg (180 lb 12.4 oz) 79.3 kg (174 lb 13.2 oz) 79.1 kg (174 lb 6.1 oz)    Examination:  General exam: ill appearing male - slow speech Respiratory system: Clear to auscultation. Respiratory effort normal. Cardiovascular system: S1 & S2 heard, RRR Gastrointestinal system:  no rebound tenderness/gaurding, pos BS Central nervous system: Asleep, arousable. Slow speech Extremities: weak, tremulous   Data Reviewed: I have personally reviewed following labs and imaging studies  CBC:  Recent Labs Lab 07/31/16 1451 08/01/16 0347 08/02/16 0423 08/03/16 0508  WBC 5.0 4.5 4.7 5.2  HGB 9.6* 9.3* 9.3* 10.0*  HCT 26.2* 24.4* 24.4* 26.6*  MCV 98.9 97.6 98.4 100.0  PLT 29* 27* 35* 41*   Basic Metabolic Panel:  Recent Labs Lab 07/31/16 1451 08/01/16 0347 08/02/16 0706 08/03/16 0508  NA 138 136 133* 136  K 4.2 3.9 3.4* 3.8  CL 103 102 99* 101  CO2 25 25 26 26   GLUCOSE 117* 103* 119* 123*  BUN 28* 32* 41* 40*  CREATININE 1.77* 1.93* 1.88* 1.67*  CALCIUM 8.7* 8.5* 8.3* 8.8*   GFR: Estimated Creatinine Clearance: 41.2 mL/min (by C-G formula based on SCr of 1.67 mg/dL (H)). Liver Function Tests:  Recent Labs Lab 07/31/16 1451 08/02/16 0706 08/03/16 0508  AST 114* 120* 148*  ALT 46 45 56  ALKPHOS 85 94 110  BILITOT 5.4* 3.0* 3.4*  PROT 6.4* 5.9* 7.2  ALBUMIN 1.9* 1.9* 2.1*    Recent Labs Lab 07/31/16 1451  LIPASE 30    Recent Labs Lab 08/02/16 1005  AMMONIA 41*   Coagulation Profile:  Recent Labs Lab 07/31/16 1451 08/01/16 0347  INR 2.10 2.19   Cardiac Enzymes: No results for input(s): CKTOTAL, CKMB, CKMBINDEX, TROPONINI in the last 168 hours. BNP (last 3 results) No results for input(s): PROBNP in the last 8760 hours. HbA1C: No results for input(s): HGBA1C in the last 72 hours. CBG: No results for input(s): GLUCAP in the last 168 hours. Lipid Profile: No results for input(s): CHOL, HDL, LDLCALC, TRIG, CHOLHDL, LDLDIRECT in the last 72 hours. Thyroid Function Tests: No results for input(s): TSH, T4TOTAL, FREET4, T3FREE, THYROIDAB in the last 72 hours. Anemia Panel: No results for input(s): VITAMINB12, FOLATE, FERRITIN, TIBC, IRON, RETICCTPCT in the last 72 hours. Sepsis Labs: No results for input(s): PROCALCITON,  LATICACIDVEN in the last 168 hours.  Recent Results (from the past 240 hour(s))  Culture, body fluid-bottle     Status: None (Preliminary result)   Collection Time: 08/01/16 10:38 AM  Result Value Ref Range Status   Specimen Description FLUID PERITONEAL  Final   Special Requests NONE  Final   Culture   Final    NO GROWTH < 24 HOURS Performed at [facility]    Report Status PENDING  Incomplete  Gram stain     Status: None   Collection Time: 08/01/16 10:38 AM  Result Value Ref Range Status   Specimen Description FLUID PERITONEAL  Final   Special Requests NONE  Final   Gram Stain   Final    RARE WBC PRESENT,BOTH PMN AND MONONUCLEAR NO ORGANISMS SEEN Performed at [facility]    Report Status 08/01/2016 FINAL  Final     Radiology Studies: US Paracentesis  Result Date: 08/01/2016 INDICATION: Patient with a history of  hepatitis-C and alcoholic cirrhosis. Ascites present. Request is made for diagnostic and therapeutic paracentesis. EXAM: ULTRASOUND GUIDED DIAGNOSTIC AND THERAPEUTIC PARACENTESIS MEDICATIONS: 1% lidocaine. COMPLICATIONS: None immediate. PROCEDURE: Informed written consent was obtained from the patient after a discussion of the risks, benefits and alternatives to treatment. A timeout was performed prior to the initiation of the procedure. Initial ultrasound scanning demonstrates a moderate amount of ascites within the left lower abdominal quadrant. The left lower abdomen was prepped and draped in the usual sterile fashion. 1% lidocaine was used for local anesthesia. Following this, a 19 gauge, 7-cm, Yueh catheter was introduced. An ultrasound image was saved for documentation purposes. The paracentesis was performed. The catheter was removed and a dressing was applied. The patient tolerated the procedure well without immediate post procedural complication. FINDINGS: A total of approximately 3 L of clear yellow fluid was removed. Samples were sent to the laboratory  as requested by the clinical team. IMPRESSION: Successful ultrasound-guided paracentesis yielding 3 liters of peritoneal fluid. Read by: Saverio Danker, PA-C Electronically Signed   By: Lucrezia Europe M.D.   On: 08/01/2016 11:19    Scheduled Meds: . aspirin  81 mg Per Tube Q24H  . atorvastatin  40 mg Per Tube q1800  . chlorhexidine  15 mL Mouth Rinse BID  . feeding supplement (ENSURE ENLIVE)  237 mL Oral BID BM  . folic acid  1 mg Oral Daily  . furosemide  20 mg Intravenous Q12H  . LORazepam  0-4 mg Intravenous Q12H  . mouth rinse  15 mL Mouth Rinse q12n4p  . multivitamin with minerals  1 tablet Oral Daily  . nicotine  21 mg Transdermal Daily  . rifaximin  550 mg Oral BID  . sodium chloride flush  3 mL Intravenous Q12H  . thiamine  100 mg Oral Daily   Or  . thiamine  100 mg Intravenous Daily   Continuous Infusions:   LOS: 3 days   [place], DO [facility] Pager [phone]  If 7PM-7AM, please contact night-coverage [email or web address] Password [email or web address] 08/03/2016, 11:11 AM

## 2016-08-04 LAB — BASIC METABOLIC PANEL
ANION GAP: 7 (ref 5–15)
BUN: 43 mg/dL — ABNORMAL HIGH (ref 6–20)
CO2: 27 mmol/L (ref 22–32)
Calcium: 8.7 mg/dL — ABNORMAL LOW (ref 8.9–10.3)
Chloride: 101 mmol/L (ref 101–111)
Creatinine, Ser: 1.78 mg/dL — ABNORMAL HIGH (ref 0.61–1.24)
GFR, EST AFRICAN AMERICAN: 44 mL/min — AB (ref >60)
GFR, EST NON AFRICAN AMERICAN: 38 mL/min — AB (ref >60)
GLUCOSE: 135 mg/dL — AB (ref 65–99)
POTASSIUM: 3.6 mmol/L (ref 3.5–5.1)
Sodium: 135 mmol/L (ref 135–145)

## 2016-08-04 LAB — GLUCOSE, CAPILLARY
Glucose-Capillary: 132 mg/dL — ABNORMAL HIGH (ref 65–99)
Glucose-Capillary: 162 mg/dL — ABNORMAL HIGH (ref 65–99)

## 2016-08-04 LAB — CBC
HCT: 34.5 % — ABNORMAL LOW (ref 39.0–52.0)
Hemoglobin: 12.5 g/dL — ABNORMAL LOW (ref 13.0–17.0)
MCH: 36.3 pg — ABNORMAL HIGH (ref 26.0–34.0)
MCHC: 36.2 g/dL — ABNORMAL HIGH (ref 30.0–36.0)
MCV: 100.3 fL — AB (ref 78.0–100.0)
PLATELETS: 30 10*3/uL — AB (ref 150–400)
RBC: 3.44 MIL/uL — ABNORMAL LOW (ref 4.22–5.81)
RDW: 21 % — ABNORMAL HIGH (ref 11.5–15.5)
WBC: 3.7 10*3/uL — AB (ref 4.0–10.5)

## 2016-08-04 MED ORDER — VITAMIN K1 10 MG/ML IJ SOLN
10.0000 mg | Freq: Once | INTRAMUSCULAR | Status: AC
  Administered 2016-08-04: 10 mg via INTRAVENOUS
  Filled 2016-08-04: qty 1

## 2016-08-04 MED ORDER — METOPROLOL TARTRATE 25 MG PO TABS
12.5000 mg | ORAL_TABLET | Freq: Two times a day (BID) | ORAL | Status: DC
  Administered 2016-08-04 – 2016-08-11 (×9): 12.5 mg via ORAL
  Administered 2016-08-12: 23:00:00 via ORAL
  Administered 2016-08-12 – 2016-08-13 (×2): 12.5 mg via ORAL
  Filled 2016-08-04 (×14): qty 1

## 2016-08-04 NOTE — NC FL2 (Signed)
[place] LEVEL OF CARE SCREENING TOOL     IDENTIFICATION  Patient Name: Patrick [name] Birthdate: 27-Jul-1951 Sex: male Admission Date (Current Location): 07/31/2016  [facility] and [place] Number:  Herbalist and Address:  [facility],  [place] [address], [place]      Provider Number: [id number]  Attending Physician Name and Address:  Rosita Fire, MD  Relative Name and Phone Number:       Current Level of Care: Hospital Recommended Level of Care: [place] Prior Approval Number:    Date Approved/Denied:   PASRR Number:    Discharge Plan: SNF    Current Diagnoses: Patient Active Problem List   Diagnosis Date Noted  . Acute liver failure 08/01/2016  . Ascites due to alcoholic cirrhosis ([place]) [id number]  . Acute renal failure ([place]) 08/01/2016  . Melena 08/01/2016  . Protein-calorie malnutrition, severe ([place]) 08/01/2016  . Alcohol withdrawal ([place]) 08/01/2016  . 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 Chemotherapy-induced neuropathy ([place]) 10/19/2015  . Constipation 08/03/2015  . Cancer associated pain 08/03/2015  . Nausea with vomiting 08/03/2015  . Acute confusion   . Esophageal dysphagia 06/26/2015  . Acute encephalopathy 06/26/2015  . 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 Alcohol-induced acute pancreatitis   . Abnormal CT scan, esophagus   . Abnormal weight loss   . Dysphagia, pharyngoesophageal phase   . Fall 04/26/2015  . Numbness 04/26/2015  . Protein-calorie malnutrition, moderate ([place]) 04/26/2015  . Weakness 04/26/2015  . Abdominal pain 04/26/2015  . Pancreatitis 04/26/2015  . Alcohol abuse   . Dysphagia   . Prolonged QT interval   . Essential hypertension   . Polysubstance abuse 09/03/2012  . Leukopenia and  Thrombocytopenia 01/02/2012  . Chronic hepatitis C with cirrhosis ([place]) 01/02/2012  . Alcoholism ([place]) 01/02/2012  . Barrett's esophagus 11/17/2011  . History of colonic polyps 11/11/2011  . Thrombocytopenia ([place]) 03/03/2011  . Chest pain 12/03/2010  . CAD (coronary artery disease) 12/03/2010  . Hyperlipidemia 12/03/2010  . Tobacco abuse 12/03/2010  . Cocaine abuse 12/03/2010    Orientation RESPIRATION BLADDER Height & Weight     Self, Time, Situation, Place  Normal External catheter, Incontinent Weight: 174 lb 6.1 oz (79.1 kg) Height:  5\' 7"  (170.2 cm)  BEHAVIORAL SYMPTOMS/MOOD NEUROLOGICAL BOWEL NUTRITION STATUS      Continent Diet (Dys 1 Diet)  AMBULATORY STATUS COMMUNICATION OF NEEDS Skin   Extensive Assist Verbally Normal                       Personal Care Assistance Level of Assistance  Bathing, Dressing Bathing Assistance: Limited assistance   Dressing Assistance: Limited assistance     Functional Limitations Info             SPECIAL CARE FACTORS FREQUENCY  PT (By licensed PT), OT (By licensed OT)     PT Frequency: 5 OT Frequency: 5            Contractures      Additional Factors Info  Code Status, Allergies Code Status Info: Fullcode Allergies Info: NKDA           Current Medications (08/04/2016):  This is the current hospital active medication list Current Facility-Administered Medications  Medication Dose Route Frequency Provider Last Rate Last Dose  . acetaminophen (TYLENOL)  tablet 650 mg  650 mg Oral Q6H PRN Karmen Bongo, MD   650 mg at 08/03/16 1048   Or  . acetaminophen (TYLENOL) suppository 650 mg  650 mg Rectal Q6H PRN Karmen Bongo, MD      . atorvastatin (LIPITOR) tablet 40 mg  40 mg Per Tube q1800 Karmen Bongo, MD   40 mg at 08/03/16 1622  . chlorhexidine (PERIDEX) 0.12 % solution 15 mL  15 mL Mouth Rinse BID Karmen Bongo, MD   15 mL at 08/04/16 1015  . feeding supplement (ENSURE ENLIVE) (ENSURE ENLIVE) liquid 237 mL   237 mL Oral BID BM Donne Hazel, MD   237 mL at 08/04/16 1000  . folic acid (FOLVITE) tablet 1 mg  1 mg Oral Daily Karmen Bongo, MD   1 mg at 08/04/16 1014  . furosemide (LASIX) injection 20 mg  20 mg Intravenous Daily Geradine Girt, DO   20 mg at 08/04/16 1014  . LORazepam (ATIVAN) injection 0-4 mg  0-4 mg Intravenous Q12H Karmen Bongo, MD   0 mg at 08/02/16 2200  . MEDLINE mouth rinse  15 mL Mouth Rinse q12n4p Karmen Bongo, MD   15 mL at 08/03/16 1600  . metoprolol tartrate (LOPRESSOR) tablet 12.5 mg  12.5 mg Oral BID Dron Tanna Furry, MD      . multivitamin with minerals tablet 1 tablet  1 tablet Oral Daily Karmen Bongo, MD   1 tablet at 08/04/16 1014  . nicotine (NICODERM CQ - dosed in mg/24 hours) patch 21 mg  21 mg Transdermal Daily Karmen Bongo, MD   21 mg at 08/04/16 1016  . ondansetron (ZOFRAN) tablet 4 mg  4 mg Oral Q6H PRN Karmen Bongo, MD       Or  . ondansetron [facility]) injection 4 mg  4 mg Intravenous Q6H PRN Karmen Bongo, MD      . oxyCODONE (Oxy IR/ROXICODONE) immediate release tablet 10 mg  10 mg Oral Daily PRN Karmen Bongo, MD   10 mg at 08/04/16 1015  . phytonadione (VITAMIN K) 10 mg in dextrose 5 % 50 mL IVPB  10 mg Intravenous Once Dron Tanna Furry, MD   10 mg at 08/04/16 1544  . pneumococcal 23 valent vaccine (PNU-IMMUNE) injection 0.5 mL  0.5 mL Intramuscular Tomorrow-1000 Jessica U Vann, DO      . rifaximin (XIFAXAN) tablet 550 mg  550 mg Oral BID Geradine Girt, DO   550 mg at 08/04/16 1014  . sodium chloride flush (NS) 0.9 % injection 3 mL  3 mL Intravenous Q12H Karmen Bongo, MD   3 mL at 08/04/16 1015  . thiamine (VITAMIN B-1) tablet 100 mg  100 mg Oral Daily Karmen Bongo, MD   100 mg at 08/04/16 1014   Or  . thiamine (B-1) injection 100 mg  100 mg Intravenous Daily Karmen Bongo, MD   100 mg at 07/31/16 2200     Discharge Medications: Please see discharge summary for a list of discharge medications.  Relevant Imaging  Results:  Relevant Lab Results:   Additional Information SSN: [id number]  Standley Brooking, LCSW

## 2016-08-04 NOTE — Progress Notes (Signed)
Physical Therapy Treatment Patient Details Name: Patrick [name] MRN: [id number] DOB: Oct 29, 1950 Today's Date: 08/04/2016    History of Present Illness  66 y.o. male with medical history significant of CAD s/p PCI, Hep C, alcoholic cirrhosis, polysubstance abuse, thrombocytopenia, and esophageal CA s/p PEG tube placement and subsequent removal; adm 07/31/16 with abdominal distention    PT Comments    Difficult to arouse.  Groggy, slow but did respond to commands.  Assisted OOB to amb to bathroom required + 2 assist.  Very unsteady, groggy gait.  HIGH FALL RISK.   Follow Up Recommendations  SNF     Equipment Recommendations       Recommendations for Other Services       Precautions / Restrictions Precautions Precautions: Fall Precaution Comments: AMS Restrictions Weight Bearing Restrictions: No    Mobility  Bed Mobility Overal bed mobility: Needs Assistance Bed Mobility: Supine to Sit     Supine to sit: Mod assist;+2 for physical assistance;+2 for safety/equipment     General bed mobility comments: assist for trunk and bil LEs, incr time/decr initiation  Transfers Overall transfer level: Needs assistance Equipment used: Rolling walker (2 wheeled) Transfers: Sit to/from [employer] Transfers Sit to Stand: +2 physical assistance;+2 safety/equipment;Max assist         General transfer comment: multi-modal cues for st shift to stand, safety, hand placement turn completion  Ambulation/Gait Ambulation/Gait assistance: +2 physical assistance;Max assist Ambulation Distance (Feet): 20 Feet (10 feet x 2 to and from bathroom) Assistive device: Rolling walker (2 wheeled) Gait Pattern/deviations: Step-to pattern;Step-through pattern;Shuffle;Staggering left;Staggering right;Drifts right/left;Trunk flexed Gait velocity: decreased   General Gait Details: very unstable, druken gait with delayed initiation   Stairs            Wheelchair Mobility    Modified  [place] (Stroke Patients Only)       Balance                                    Cognition Arousal/Alertness:  (groggy) Behavior During Therapy: Flat affect Overall Cognitive Status: Impaired/Different from baseline Area of Impairment: Following commands;Problem solving       Following Commands: Follows one step commands inconsistently     Problem Solving: Slow processing;Decreased initiation;Difficulty sequencing;Requires verbal cues;Requires tactile cues      Exercises      General Comments        Pertinent Vitals/Pain      Home Living                      Prior Function            PT Goals (current goals can now be found in the care plan section) Progress towards PT goals: Progressing toward goals    Frequency    Min 3X/week      PT Plan Current plan remains appropriate    Co-evaluation             End of Session Equipment Utilized During Treatment: Gait belt Activity Tolerance: Patient limited by lethargy Patient left: in chair;with call bell/phone within reach;with chair alarm set     Time: 1121-1149 PT Time Calculation (min) (ACUTE ONLY): 28 min  Charges:  $Gait Training: 8-22 mins $Therapeutic Activity: 8-22 mins                    G Codes:      Rica Koyanagi  PTA [facility]  Acute  Rehab Pager      [phone]

## 2016-08-04 NOTE — Evaluation (Signed)
Clinical/Bedside Swallow Evaluation Patient Details  Name: Patrick [name] MRN: [id number] Date of Birth: [birth year]  Today's Date: 08/04/2016 Time: SLP Start Time (ACUTE ONLY): 71 SLP Stop Time (ACUTE ONLY): 0738 SLP Time Calculation (min) (ACUTE ONLY): 18 min  Past Medical History:  Past Medical History:  Diagnosis Date  . Alcoholic peripheral neuropathy ([place])   . Alcoholism ([place]) 01/02/2012   cocaine abuse, herion abuse in past  . Allergy   . Anemia   . Arthritis    knees  . Barrett's esophagus 11/17/2011   Short-segment dx 11/2011   . CAD (coronary artery disease)   . Cancer of thoracic esophagus ([place])   . Cataract    bil removed  . Chronic chest pain   . 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 Colon adenomas 11/11/11   x 5, one rectal tv adenoma  . GERD (gastroesophageal reflux disease)   . HTN (hypertension)   . Hyperlipidemia   . MI (myocardial infarction)    x3, jan, aug, nov 2011; treated at [facility] in [place]  . 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 Sexual dysfunction   . SSBE (short-segment Barrett's esophagus) 11/11/11  . Substance abuse    cocaine abuse in past  . Thrombocytopenia ([place])    Past Surgical History:  Past Surgical History:  Procedure Laterality Date  . BALLOON ANGIOPLASTY, ARTERY  03/2010  . CARDIAC CATHETERIZATION  01/31/2011  . CATARACT EXTRACTION W/PHACO  08/18/2012   Procedure: CATARACT EXTRACTION PHACO AND INTRAOCULAR LENS PLACEMENT (IOC);  Surgeon: Adonis Brook, MD;  Location: [facility];  Service: Ophthalmology;  Laterality: Right;  . CATARACT EXTRACTION W/PHACO Left 09/22/2012   Procedure: CATARACT EXTRACTION PHACO AND INTRAOCULAR LENS PLACEMENT (IOC);  Surgeon: Adonis Brook, MD;  Location: [facility];  Service: Ophthalmology;  Laterality: Left;  . COLONOSCOPY  11/11/11   Dr. Silvano Rusk  . CORONARY ANGIOPLASTY    . ESOPHAGOGASTRODUODENOSCOPY  11/11/11   Dr. Silvano Rusk  . ESOPHAGOGASTRODUODENOSCOPY (EGD) WITH PROPOFOL N/A 04/27/2015    Procedure: ESOPHAGOGASTRODUODENOSCOPY (EGD) WITH PROPOFOL;  Surgeon: Jerene Bears, MD;  Location: [facility] ENDOSCOPY;  Service: Endoscopy;  Laterality: N/A;  . HERNIA REPAIR     very young  . PEG PLACEMENT N/A 06/26/2015   Procedure: PERCUTANEOUS ENDOSCOPIC GASTROSTOMY (PEG) PLACEMENT;  Surgeon: Gatha Mayer, MD;  Location: [facility] ENDOSCOPY;  Service: Endoscopy;  Laterality: N/A;  . UPPER GASTROINTESTINAL ENDOSCOPY     HPI:  Patrick [name] a 66 y.o.malewith medical history significant of CAD s/p PCI, Hep C, alcoholic cirrhosis, polysubstance abuse, thrombocytopenia, and esophageal CA s/p PEG tube placement and subsequent removal presenting with "stomach swelling up". Has been happening for over a month now. +n/v. No hematemesis. Decreased PO intake. Had a feeding tube (for h/o esophageal cancer) but it was removed over a month ago. Diarrhea stools for days. 2-4 stools/day. +subjective fever. Has chronic cough. Last drink was before Christmas. Generally drinks 1/5 per day. +tremors. Visual hallucinations. No seizures.  The patient is s/p paracentesis which removed 3 liters of fluid.  The patient reported that he is s/p chemo/radiation to treat esophageal cancer and that he had PEG tube due to issues with intake during his treatment.  He reported he was told it was safe to remove.     Assessment / Plan / Recommendation Clinical Impression  Clinical swallowing evaluation was completed using ice chips, thin liquids via tsp, cup and straw sips, pureed material and dual textured solids.  Oral mechanism exam was attempted and not fully  completed due to the patient's difficulty following commands.  Decreased lingual ROM was noted.  The patient reported that he recieved a PEG during his treatment for esophageal cancer which has been removed about a month ago.  He reported that he has been mostly eating blended food at home.  The patient presented with oral dysphagia characterized by delayed oral transit.   Suspect esophageal component based upon the patient's complaints and history of esophageal cancer s/p chemo/radiation.  Swallow trigger appeared to be timely and hyo-laryngeal excursion was judged to be adequate to palpation.  Despite thorough challenging overt s/s of aspiration were not seen.  Dual textured solids were given and the patient was very slow to masticate.  Givent his recommend continue with the pureed diet with thin liquids.  Give meds crushed in puree but follow with a liquid wash.  ST will follow up for therapeutic diet tolerance and patient education.      Aspiration Risk  Mild aspiration risk    Diet Recommendation   Dysphagia 1 with thin liquids.    Medication Administration: Crushed with puree (Follow with liquid wash.)    Other  Recommendations Oral Care Recommendations: Oral care QID   Follow up Recommendations  (TBD)      Frequency and Duration min 2x/week  2 weeks       Prognosis Prognosis for Safe Diet Advancement: Fair      Swallow Study   General Date of Onset: 07/01/16 HPI: Patrick [name] a 66 y.o.malewith medical history significant of CAD s/p PCI, Hep C, alcoholic cirrhosis, polysubstance abuse, thrombocytopenia, and esophageal CA s/p PEG tube placement and subsequent removal presenting with "stomach swelling up". Has been happening for over a month now. +n/v. No hematemesis. Decreased PO intake. Had a feeding tube (for h/o esophageal cancer) but it was removed over a month ago. Diarrhea stools for days. 2-4 stools/day. +subjective fever. Has chronic cough. Last drink was before Christmas. Generally drinks 1/5 per day. +tremors. Visual hallucinations. No seizures.  The patient is s/p paracentesis which removed 3 liters of fluid.  The patient reported that he is s/p chemo/radiation to treat esophageal cancer and that he had PEG tube due to issues with intake during his treatment.  He reported he was told it was safe to remove.   Type of Study:  Bedside Swallow Evaluation Previous Swallow Assessment: None noted at [facility].   Diet Prior to this Study: Dysphagia 1 (puree);Thin liquids Temperature Spikes Noted: Yes (Intermittent low grade temps since admission.  ) Respiratory Status: Room air History of Recent Intubation: No Behavior/Cognition: Cooperative;Requires cueing Oral Cavity Assessment: Dry Oral Care Completed by SLP: No Oral Cavity - Dentition:  (Pt has some lower and no upper teeth.  ) Vision: Functional for self-feeding Self-Feeding Abilities: Needs assist Patient Positioning: Partially reclined Baseline Vocal Quality: Low vocal intensity Volitional Cough: Strong Volitional Swallow: Able to elicit    Oral/Motor/Sensory Function Overall Oral Motor/Sensory Function:  (Limited assessment due to issues following commands.  )   Ice Chips Ice chips: Impaired Presentation: Spoon Oral Phase Impairments: Impaired mastication Oral Phase Functional Implications: Prolonged oral transit   Thin Liquid Thin Liquid: Within functional limits Presentation: Cup;Spoon;Straw    Nectar Thick Nectar Thick Liquid: Not tested   Honey Thick Honey Thick Liquid: Not tested   Puree Puree: Impaired Presentation: Spoon Oral Phase Impairments: Impaired mastication Oral Phase Functional Implications: Prolonged oral transit   Solid   GO   Solid: Impaired Presentation: Spoon Oral Phase Impairments: Impaired mastication  Oral Phase Functional Implications: Prolonged oral transit        Shelly Flatten, MA, CCC-SLP Acute Rehab SLP [phone] Shelly Flatten N 08/04/2016,7:51 AM

## 2016-08-04 NOTE — Progress Notes (Addendum)
PROGRESS NOTE    Patrick [name]  [id number] DOB: 20-Dec-1950 DOA: 07/31/2016 PCP: Javier Docker, MD   Brief Narrative: 66 y.o.malewith medical history significant of CAD s/p PCI, Hep C, alcoholic cirrhosis, polysubstance abuse, thrombocytopenia, and esophageal CA s/p PEG tube placement and subsequent removal presenting with "stomach swelling up". Has been happening for over a month now. +n/v. No hematemesis. Decreased PO intake. Had a feeding tube (for h/o esophageal cancer) but it was removed over a month ago.Last drink was before Christmas. Generally drinks 1/5 per day. +tremors.   Assessment & Plan:  # Acute liver failure with ascites -Patient with known h/o alcoholic cirrhosis and ongoing ETOH dependence presenting with new-onset ascites, heme positive stools, and worsening LFTs (AST 114, ALT 46; Bili 5.4 (0.9 in 6/17); INR 2.10 at time of presentation -Patient underwent US guided paracentesis on 12/29 with removal of 3 L of fluid. Fluid analysis reviewed. Not suggestive of SBP. -Will continue to gently diurese with Lasix, but will hold additional Spironolactone and aggressive diuresis for now because of hypotension. -ammonia slightly elevated-- add xifaxam -encourage oral intake.  -Need to reassess tomorrow for another paracentesis.  # Acute renal failure likely in the setting of ascites and liver disease versus  progressive chronic kidney disease: Serum creatinine level is stable. UA unremarkable. On low-dose IV Lasix. CT scan of abdomen showed atrophic right kidney and nonobstructive 3 mm stone in the left kidney with no left hydroureter. Continue to monitor BMP. Avoid nephrotoxins.  # Melena -Heme positive in ED -Hemoglobin is stable now with no sign of active bleeding. Patient has coagulopathy with elevated INR and thrombocytopenia Currently hemodynamically stable. I will minimize lab draws. May need outpatient follow-up with gastroenterologist. I will order a dose of  vitamin K 10 mg. -I will discontinue aspirin today.  #Thrombocytopenia: Chronic, monitor CBC. No sign of active bleeding.  # Prolonged QT -Will need to judiciously use medications which may increase the QT interval -Continue on tele  #Alcohol withdrawal -Patient with chronic ETOH dependence - failed rehab long ago -Reports last drink was before Christmas -He is at ongoing increasedrisk for complications of withdrawal including seizures, DTs -Will continue CIWA, vitamins and supportive care. Added low-dose metoprolol because of bradycardia. Patient takes Coreg at home. Has borderline low blood pressure.  # Mild to moderate Malnutrition -Nutrition consulted  # Tobacco dependence -Encourage cessation.  -Nicotine patch ordered  #H/o feeding tube and dysphagia from esophageal cancer SLP eval appreciated currently on dysphagia diet. Continue to monitor.  #Physical deconditioning: PT recommended SNF. Given multiple comorbidities, patient may benefit from palliative care service. I'll consult palliative care today to discuss goals of care.    Principal Problem:   Acute liver failure Active Problems:   Tobacco abuse   Thrombocytopenia (HCC)   Prolonged QT interval   Ascites   Acute renal failure (HCC)   Melena   Protein-calorie malnutrition, severe (HCC)   Alcohol withdrawal (HCC)  DVT prophylaxis: SCD. No anticoagulation because of coagulopathy and thrombocytopenia Code Status: Full code Family Communication: No family present at bedside Disposition Plan: Likely discharge to SNF in 1-2 days. Social worker evaluation ongoing.    Consultants:   Will consult palliative care  Procedures: None Antimicrobials: None  Subjective: Patient was seen and examined at bedside. Patient reported weakness fatigue. Denied fever, chills, headache, dizziness, nausea vomiting or abdominal pain. No chest pain or shortness of breath.   Objective: Vitals:   08/03/16 2000 08/03/16  2235 08/04/16 [id number] 08/04/16 [id number]  BP:  106/64  103/71  Pulse: (!) 122 (!) 132 (!) 115 (!) 117  Resp:  (!) 22 18 16   Temp:  98.9 F (37.2 C)  98.2 F (36.8 C)  TempSrc:  Oral  Oral  SpO2:  93%  93%  Weight:    79.1 kg (174 lb 6.1 oz)  Height:        Intake/Output Summary (Last 24 hours) at 08/04/16 1347 Last data filed at 08/04/16 1026  Gross per 24 hour  Intake              420 ml  Output              500 ml  Net              -80 ml   Filed Weights   08/02/16 0500 08/03/16 0607 08/04/16 0617  Weight: 79.3 kg (174 lb 13.2 oz) 79.1 kg (174 lb 6.1 oz) 79.1 kg (174 lb 6.1 oz)    Examination:  General exam: Ill-looking male lying on bed comfortable.  Respiratory system: Clear to auscultation. Respiratory effort normal. No wheezing or crackle Cardiovascular system: S1 & S2 heard, RRR.  No pedal edema. Gastrointestinal system: Abdomen is distended, firm, nontender. Bowel sound positive Central nervous system: Alert, awake and following commands. Upper extremities tremor. Extremities: Symmetric 5 x 5 power. Skin: No rashes, lesions or ulcers Psychiatry: Judgement and insight appear normal.    Data Reviewed: I have personally reviewed following labs and imaging studies  CBC:  Recent Labs Lab 07/31/16 1451 08/01/16 0347 08/02/16 0423 08/03/16 0508 08/04/16 0451  WBC 5.0 4.5 4.7 5.2 3.7*  HGB 9.6* 9.3* 9.3* 10.0* 12.5*  HCT 26.2* 24.4* 24.4* 26.6* 34.5*  MCV 98.9 97.6 98.4 100.0 100.3*  PLT 29* 27* 35* 41* 30*   Basic Metabolic Panel:  Recent Labs Lab 07/31/16 1451 08/01/16 0347 08/02/16 0706 08/03/16 0508 08/04/16 0451  NA 138 136 133* 136 135  K 4.2 3.9 3.4* 3.8 3.6  CL 103 102 99* 101 101  CO2 25 25 26 26 27   GLUCOSE 117* 103* 119* 123* 135*  BUN 28* 32* 41* 40* 43*  CREATININE 1.77* 1.93* 1.88* 1.67* 1.78*  CALCIUM 8.7* 8.5* 8.3* 8.8* 8.7*   GFR: Estimated Creatinine Clearance: 38.7 mL/min (by C-G formula based on SCr of 1.78 mg/dL (H)). Liver  Function Tests:  Recent Labs Lab 07/31/16 1451 08/02/16 0706 08/03/16 0508  AST 114* 120* 148*  ALT 46 45 56  ALKPHOS 85 94 110  BILITOT 5.4* 3.0* 3.4*  PROT 6.4* 5.9* 7.2  ALBUMIN 1.9* 1.9* 2.1*    Recent Labs Lab 07/31/16 1451  LIPASE 30    Recent Labs Lab 08/02/16 1005  AMMONIA 41*   Coagulation Profile:  Recent Labs Lab 07/31/16 1451 08/01/16 0347  INR 2.10 2.19   Cardiac Enzymes: No results for input(s): CKTOTAL, CKMB, CKMBINDEX, TROPONINI in the last 168 hours. BNP (last 3 results) No results for input(s): PROBNP in the last 8760 hours. HbA1C: No results for input(s): HGBA1C in the last 72 hours. CBG:  Recent Labs Lab 08/04/16 0740 08/04/16 1217  GLUCAP 132* 162*   Lipid Profile: No results for input(s): CHOL, HDL, LDLCALC, TRIG, CHOLHDL, LDLDIRECT in the last 72 hours. Thyroid Function Tests: No results for input(s): TSH, T4TOTAL, FREET4, T3FREE, THYROIDAB in the last 72 hours. Anemia Panel: No results for input(s): VITAMINB12, FOLATE, FERRITIN, TIBC, IRON, RETICCTPCT in the last 72 hours. Sepsis Labs: No results for input(s): PROCALCITON, LATICACIDVEN  in the last 168 hours.  Recent Results (from the past 240 hour(s))  Culture, body fluid-bottle     Status: None (Preliminary result)   Collection Time: 08/01/16 10:38 AM  Result Value Ref Range Status   Specimen Description FLUID PERITONEAL  Final   Special Requests NONE  Final   Culture   Final    NO GROWTH 2 DAYS Performed at [facility]    Report Status PENDING  Incomplete  Gram stain     Status: None   Collection Time: 08/01/16 10:38 AM  Result Value Ref Range Status   Specimen Description FLUID PERITONEAL  Final   Special Requests NONE  Final   Gram Stain   Final    RARE WBC PRESENT,BOTH PMN AND MONONUCLEAR NO ORGANISMS SEEN Performed at [facility]    Report Status 08/01/2016 FINAL  Final         Radiology Studies: No results found.      Scheduled  Meds: . aspirin  81 mg Per Tube Q24H  . atorvastatin  40 mg Per Tube q1800  . chlorhexidine  15 mL Mouth Rinse BID  . feeding supplement (ENSURE ENLIVE)  237 mL Oral BID BM  . folic acid  1 mg Oral Daily  . furosemide  20 mg Intravenous Daily  . LORazepam  0-4 mg Intravenous Q12H  . mouth rinse  15 mL Mouth Rinse q12n4p  . metoprolol tartrate  12.5 mg Oral BID  . multivitamin with minerals  1 tablet Oral Daily  . nicotine  21 mg Transdermal Daily  . pneumococcal 23 valent vaccine  0.5 mL Intramuscular Tomorrow-1000  . rifaximin  550 mg Oral BID  . sodium chloride flush  3 mL Intravenous Q12H  . thiamine  100 mg Oral Daily   Or  . thiamine  100 mg Intravenous Daily   Continuous Infusions:   LOS: 4 days    Dron Tanna Furry, MD [facility] Pager [phone]  If 7PM-7AM, please contact night-coverage [email or web address] Password [email or web address] 08/04/2016, 1:47 PM

## 2016-08-05 DIAGNOSIS — E43 Unspecified severe protein-calorie malnutrition: Secondary | ICD-10-CM

## 2016-08-05 DIAGNOSIS — N179 Acute kidney failure, unspecified: Secondary | ICD-10-CM

## 2016-08-05 DIAGNOSIS — Z7189 Other specified counseling: Secondary | ICD-10-CM

## 2016-08-05 DIAGNOSIS — Z515 Encounter for palliative care: Secondary | ICD-10-CM

## 2016-08-05 LAB — PROTIME-INR
INR: 2.03
Prothrombin Time: 23.2 seconds — ABNORMAL HIGH (ref 11.4–15.2)

## 2016-08-05 LAB — BASIC METABOLIC PANEL
Anion gap: 7 (ref 5–15)
BUN: 38 mg/dL — AB (ref 6–20)
CHLORIDE: 100 mmol/L — AB (ref 101–111)
CO2: 28 mmol/L (ref 22–32)
CREATININE: 1.4 mg/dL — AB (ref 0.61–1.24)
Calcium: 8.3 mg/dL — ABNORMAL LOW (ref 8.9–10.3)
GFR calc Af Amer: 59 mL/min — ABNORMAL LOW (ref >60)
GFR, EST NON AFRICAN AMERICAN: 51 mL/min — AB (ref >60)
GLUCOSE: 150 mg/dL — AB (ref 65–99)
POTASSIUM: 3.4 mmol/L — AB (ref 3.5–5.1)
Sodium: 135 mmol/L (ref 135–145)

## 2016-08-05 MED ORDER — RIFAXIMIN 200 MG PO TABS
400.0000 mg | ORAL_TABLET | Freq: Three times a day (TID) | ORAL | Status: DC
  Administered 2016-08-05 – 2016-08-07 (×7): 400 mg via ORAL
  Filled 2016-08-05 (×10): qty 2

## 2016-08-05 MED ORDER — OXYCODONE HCL 5 MG PO TABS
10.0000 mg | ORAL_TABLET | Freq: Two times a day (BID) | ORAL | Status: DC | PRN
  Administered 2016-08-06 – 2016-08-12 (×6): 10 mg via ORAL
  Filled 2016-08-05 (×7): qty 2

## 2016-08-05 MED ORDER — PHYTONADIONE 5 MG PO TABS
10.0000 mg | ORAL_TABLET | Freq: Once | ORAL | Status: AC
  Administered 2016-08-05: 10 mg via ORAL
  Filled 2016-08-05: qty 2

## 2016-08-05 MED ORDER — POTASSIUM CHLORIDE CRYS ER 20 MEQ PO TBCR
20.0000 meq | EXTENDED_RELEASE_TABLET | Freq: Once | ORAL | Status: AC
  Administered 2016-08-05: 20 meq via ORAL
  Filled 2016-08-05: qty 1

## 2016-08-05 MED ORDER — SODIUM CHLORIDE 0.9 % IV BOLUS (SEPSIS)
250.0000 mL | Freq: Once | INTRAVENOUS | Status: AC | PRN
  Administered 2016-08-05: 250 mL via INTRAVENOUS

## 2016-08-05 MED ORDER — RIFAXIMIN 550 MG PO TABS: 550.0000 mg | ORAL_TABLET | Freq: Three times a day (TID) | ORAL | Status: DC

## 2016-08-05 NOTE — Progress Notes (Signed)
CSW received consult for ETOH abuse & SNF placement. Patient declined SNF placement, stating that he would prefer to return home, states that he feels safe going home although CSW reviewed PT/OT notes indicating that he requires 2+ assist. Patient adamant against SNF.   CSW spoke with patient re: ETOH abuse. Patient states that he has already decided before Christmas that he was going to quit smoking & drinking and feels that he does not need any assistance with that. Patient states that he has been to [facility] [place] meetings in the past, but declined schedule.   RNCM, Juliann Pulse made aware that patient declining SNF. No further CSW needs identified - CSW signing off.   Patrick [name], [facility] Clinical Social Worker cell #: [phone]

## 2016-08-05 NOTE — Consult Note (Signed)
Consultation Note Date: 08/05/2016   Patient Name: Patrick [name]  DOB: February 16, 1951  MRN: [id number]  Age / Sex: 66 y.o., male  PCP: Patrick Docker, MD Referring Physician: Patrecia Pour, MD  Reason for Consultation: Establishing goals of care  HPI/Patient Profile: 66 y.o. male  with past medical history of CAD s/p PCI, Hep C, alcoholic cirrhosis, polysubstance abuse, thrombocytopenia, and esophageal CA s/p PEG tube placement and subsequent removal admitted on 07/31/2016 with hepatic failure, renal failure.  Palliative consulted for goals of care.   Clinical Assessment and Goals of Care: I met with Patrick [name].  He reports the most important things to him or being in his home and his family. He does reports that he hopes to get better control of his current pain.  We discussed his clinical course to this point in time as well as pathways will be forwarded. He was not very engaged in the conversation and appeared skeptical of my reasoning for asking questions about his understanding of his current condition and his current symptoms including pain management.  We did discuss his family situation and he reports having a daughter in [place] as well as a 38 year old son. He also reports that his parents are living. He was very clear in stating he does not feel they're the best individuals to help make medical decisions in the event they cannot make them himself. He states his sister is the person he trusts most to make decisions regarding his health and he would want to name her as his surrogate decision maker.    SUMMARY OF RECOMMENDATIONS   - [name] reports that his sister is the family member whom he relies on help him make medical decisions.  He would like to name her as surrogate in the event he cannot make his own decisions. He does have living parents as well as a grown daughter and a 13 year old son. I  placed a consult to Patrick [name] in order to help him create an advance directive naming his sister as HCPOA. - In talking with him today, it is not clear to me if he truly grasps the severity of his situation. He is able to verbalize that he has liver disease that is not going to get better. He stated today that he thinks will be helpful to have his sister involved in conversation. - I placed a call to his sister and left a voicemail requesting a return call. It is noted that he has family coming into town in the next day or 2. He tells me today that he thinks his sister may be coming to visit. We'll plan to continue conversation with him in conjunction with his sister either in person or via phone.  Code Status/Advance Care Planning:  Full code- we did not discuss this today. I think will beneficial to have his sister as part of the discussion.   Symptom Management:   Pain: Reports that he generally takes oxycodone twice per day. He is currently ordered this once per  day. While he may have impaired clearance secondary to liver in kidney dysfunction, I do not think it is likely he is receiving sufficient coverage from having short acting medication once per day. I will increase it to his home dosage of oxycodone 10 mg twice daily as needed.  Palliative Prophylaxis:   Bowel Regimen and Delirium Protocol  Additional Recommendations (Limitations, Scope, Preferences):  Full Scope Treatment  Psycho-social/Spiritual:   Desire for further Chaplaincy support:yes- he would like to create a document naming his sister as surrogate decision maker  Additional Recommendations: Referral to Community Resources   Prognosis:   Unable to determine  Discharge Planning: Home with Home Health most likely as patient has been adamantly refusing skilled placement      Primary Diagnoses: Present on Admission: . Acute liver failure . Ascites due to alcoholic cirrhosis (HCC) . Acute renal failure  (HCC) . Melena . Prolonged QT interval . Protein-calorie malnutrition, severe (HCC) . Thrombocytopenia (HCC) . Tobacco abuse . Alcohol withdrawal (HCC)   I have reviewed the medical record, interviewed the patient and family, and examined the patient. The following aspects are pertinent.  Past Medical History:  Diagnosis Date  . Alcoholic peripheral neuropathy (HCC)   . Alcoholism (HCC) 01/02/2012   cocaine abuse, herion abuse in past  . Allergy   . Anemia   . Arthritis    knees  . Barrett's esophagus 11/17/2011   Short-segment dx 11/2011   . CAD (coronary artery disease)   . Cancer of thoracic esophagus (HCC)   . Cataract    bil removed  . Chronic chest pain   . Chronic hepatitis C with cirrhosis (HCC) 01/02/2012  . Colon adenomas 11/11/11   x 5, one rectal tv adenoma  . GERD (gastroesophageal reflux disease)   . HTN (hypertension)   . Hyperlipidemia   . MI (myocardial infarction)    x3, jan, aug, nov 2011; treated at [facility] in [place]  . 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 Sexual dysfunction   . SSBE (short-segment Barrett's esophagus) 11/11/11  . Substance abuse    cocaine abuse in past  . Thrombocytopenia (HCC)    Social History   Social History  . Marital status: Single    Spouse name: N/A  . Number of children: 3  . Years of education: N/A   Occupational History  . disabled     Unemployed   Social History Main Topics  . Smoking status: Former Smoker    Packs/day: 0.10    Years: 45.00    Types: Cigarettes    Quit date: 07/27/2016  . Smokeless tobacco: Never Used     Comment: 1-2 cig/day  . Alcohol use 0.0 oz/week     Comment: 1/5 daily, stopped 07/27/16  . Drug use: No     Comment: Cocaine + 08/2012. Previous heroin, cocaine and marijuana abuse.   Marland Kitchen Sexual activity: Not Currently   Other Topics Concern  . None   Social History Narrative   Single, lives alone   Poor social support-depends on others for transportation   Fall Risk-ambulates  w/cane. Has rolling walker at home   Chronic depression   Family History  Problem Relation Age of Onset  . Cancer      mother's side/fathers side, 2 uncles  . Heart attack Maternal Grandfather   . Diabetes Sister   . Stroke Sister   . Healthy Daughter   . Healthy Son   . Colon cancer Neg Hx   . Esophageal cancer Neg  Hx   . Rectal cancer Neg Hx   . Stomach cancer Neg Hx    Scheduled Meds: . atorvastatin  40 mg Per Tube q1800  . chlorhexidine  15 mL Mouth Rinse BID  . feeding supplement (ENSURE ENLIVE)  237 mL Oral BID BM  . folic acid  1 mg Oral Daily  . furosemide  20 mg Intravenous Daily  . mouth rinse  15 mL Mouth Rinse q12n4p  . metoprolol tartrate  12.5 mg Oral BID  . multivitamin with minerals  1 tablet Oral Daily  . nicotine  21 mg Transdermal Daily  . pneumococcal 23 valent vaccine  0.5 mL Intramuscular Tomorrow-1000  . rifaximin  400 mg Oral Q8H  . sodium chloride flush  3 mL Intravenous Q12H  . thiamine  100 mg Oral Daily   Or  . thiamine  100 mg Intravenous Daily   Continuous Infusions: PRN Meds:.acetaminophen **OR** acetaminophen, ondansetron **OR** ondansetron (ZOFRAN) IV, oxyCODONE Medications Prior to Admission:  Prior to Admission medications   Medication Sig Start Date End Date Taking? Authorizing Provider  aspirin 81 MG tablet Place 1 tablet (81 mg total) into feeding tube daily. 06/29/15  Yes Hosie Poisson, MD  atorvastatin (LIPITOR) 40 MG tablet Place 1 tablet (40 mg total) into feeding tube daily at 6 PM. 06/29/15  Yes Hosie Poisson, MD  lisinopril (PRINIVIL,ZESTRIL) 20 MG tablet Place 1 tablet (20 mg total) into feeding tube daily. 06/29/15  Yes Hosie Poisson, MD  Oxycodone HCl 10 MG TABS Take 10 mg by mouth daily as needed (pain).  04/16/16  Yes Historical Provider, MD  AMITIZA 24 MCG capsule Take 24 mcg by mouth 2 (two) times daily. 05/12/16   Historical Provider, MD  carvedilol (COREG) 12.5 MG tablet Place 1 tablet (12.5 mg total) into feeding tube 2  (two) times daily with a meal. Patient not taking: Reported on 07/31/2016 06/29/15   Hosie Poisson, MD  Multiple Vitamin (MULTIVITAMIN WITH MINERALS) TABS tablet Place 1 tablet into feeding tube daily. 06/29/15   Hosie Poisson, MD  nitroGLYCERIN (NITROSTAT) 0.4 MG SL tablet Place 1 tablet (0.4 mg total) under the tongue every 5 (five) minutes as needed for chest pain. 08/06/15   Brett Canales, PA-C   No Known Allergies Review of Systems  Constitutional: Positive for activity change, appetite change, fatigue and unexpected weight change.  Respiratory: Positive for shortness of breath.   Gastrointestinal: Positive for abdominal distention, abdominal pain, anal bleeding and nausea.  Musculoskeletal: Positive for back pain and neck pain.  Neurological: Positive for weakness.  Psychiatric/Behavioral: Positive for sleep disturbance.    Physical Exam General: Ill-appearing male, slow to answer questions when no acute distress.  HEENT: No bruits, no goiter, no JVD Heart: Regular rate and rhythm. No murmur appreciated. Lungs: Good air movement, clear Abdomen: Soft, nontender, distended, positive bowel sounds.  Ext: + LE edema Skin: Warm and dry  Vital Signs: BP 110/67 (BP Location: Left Arm)   Pulse 89   Temp 98.7 F (37.1 C) (Oral)   Resp 18   Ht '5\' 7"'$  (1.702 m)   Wt 79.1 kg (174 lb 6.1 oz)   SpO2 96%   BMI 27.31 kg/m  Pain Assessment: 0-10 POSS *See Group Information*: S-Acceptable,Sleep, easy to arouse Pain Score: Asleep   SpO2: SpO2: 96 % O2 Device:SpO2: 96 % O2 Flow Rate: .   IO: Intake/output summary:  Intake/Output Summary (Last 24 hours) at 08/05/16 1857 Last data filed at 08/05/16 0630  Gross per 24  hour  Intake              260 ml  Output              425 ml  Net             -165 ml    LBM: Last BM Date: 08/01/16 Baseline Weight: Weight: 78.9 kg (173 lb 15.1 oz) Most recent weight: Weight: 79.1 kg (174 lb 6.1 oz)     Palliative Assessment/Data:   Flowsheet Rows    Flowsheet Row Most Recent Value  Intake Tab  Referral Department  Hospitalist  Unit at Time of Referral  Med/Surg Unit  Palliative Care Primary Diagnosis  Other (Comment) [Hepatic]  Date Notified  08/05/16  Palliative Care Type  New Palliative care  Reason for referral  Clarify Goals of Care  Date of Admission  07/31/16  Date first seen by Palliative Care  08/05/16  # of days Palliative referral response time  0 Day(s)  # of days IP prior to Palliative referral  5  Clinical Assessment  Palliative Performance Scale Score  50%  Pain Max last 24 hours  7  Pain Min Last 24 hours  0  Psychosocial & Spiritual Assessment  Palliative Care Outcomes  Patient/Family meeting held?  Yes  Who was at the meeting?  Patient  Palliative Care Outcomes  Provided advance care planning, ACP counseling assistance      Time In: 1430 Time Out: 1530 Time Total: 60 Greater than 50%  of this time was spent counseling and coordinating care related to the above assessment and plan.  Signed by: Micheline Rough, MD   Please contact Palliative Medicine Team phone at [phone] for questions and concerns.  For individual provider: See Shea Evans

## 2016-08-05 NOTE — Progress Notes (Signed)
PROGRESS NOTE  Patrick [name]  [id number] DOB: August 04, 1951 DOA: 07/31/2016 PCP: Javier Docker, MD  Brief Narrative: 66 y.o.malewith medical history significant of CAD s/p PCI, Hep C, alcoholic cirrhosis, polysubstance abuse, thrombocytopenia, and esophageal CA s/p PEG tube placement and subsequent removal presenting with "stomach swelling up". Has been happening for over a month now.+N/V. No hematemesis. Decreased PO intake. Had a feeding tube (for h/o esophageal cancer) but it was removed over a month ago. Last drink was before Christmas. Generally drinks 1/5 per day. +tremors.   Assessment & Plan: # Acute liver failure with ascites due to chronic alcoholic cirrhosis: INR [id number] at admission. 3L transudative paracentesis 12/29 without evidence of SBP. - Continue lasix, hold spironolactone due to hypotension. - Ammonia mildly elevated at 41, so rifaximin was started 1/1 - No indication for repeat paracentesis at this time. - Monitor resultant thrombocytopenia and coagulopathy - Palliative care consult pending  # Acute renal failure likely in the setting of ascites and liver disease versus  progressive chronic kidney disease: Serum creatinine level is stable. UA unremarkable. On low-dose IV Lasix. CT scan of abdomen showed atrophic right kidney and nonobstructive 3 mm stone in the left kidney with no left hydroureter. - Continue to monitor BMP.  - Avoid nephrotoxins.  # Melena: Heme positive in ED without gross GI bleeding since that time. Hemoglobin is stable now with no sign of active bleeding. Patient has coagulopathy with elevated INR and thrombocytopenia. Currently hemodynamically stable.  - Recommend outpatient follow up with gastroenterologist.  - Repeat vitamin K 10mg  and recheck INR in AM.  - Holding aspirin given thromboyctopenia  #Thrombocytopenia: Chronic, monitor CBC. No sign of active bleeding.  # Prolonged QT -Will need to judiciously use medications which may  increase the QT interval -Continue on tele  #Alcohol withdrawal: Patient with chronic ETOH dependence. Reports last drink was before Christmas. - Refer to outpatient rehabilitation. - Will continue CIWA, vitamins and supportive care.   # Mild to moderate malnutrition:  - Nutrition consulted  # Tobacco dependence - Encourage cessation.  - Nicotine patch ordered  #H/o feeding tube and dysphagia from esophageal cancer SLP eval appreciated currently on dysphagia diet. Continue to monitor.  #Physical deconditioning:  - PT recommended SNF but patient reluctant to not go home.  - Given multiple comorbidities, patient may benefit from palliative care service.  Principal Problem:   Acute liver failure Active Problems:   Tobacco abuse   Thrombocytopenia (HCC)   Prolonged QT interval   Ascites due to alcoholic cirrhosis (HCC)   Acute renal failure (HCC)   Melena   Protein-calorie malnutrition, severe (HCC)   Alcohol withdrawal (HCC)  DVT prophylaxis: SCD. No anticoagulation because of coagulopathy and thrombocytopenia Code Status: Full code Family Communication: Discussed at length with daughter by phone who will be traveling from [place] in the next day or two.  Disposition Plan: Likely discharge to SNF in 1-2 days. Social worker evaluation ongoing.  Consultants:   Palliative care  Procedures: U/S-guided paracentesis Antimicrobials: None  Subjective: He is complaining of being generally uncomfortable without focal pain. He is weak but without other complaints. Denies fever, chills, chest pain, dyspnea, N/V/D.  Objective: Vitals:   08/04/16 0617 08/04/16 1400 08/04/16 2233 08/05/16 0516  BP: 103/71 98/76 105/73 94/64  Pulse: (!) 117 (!) 106 (!) 109 93  Resp: 16 16 18 16   Temp: 98.2 F (36.8 C) 98.6 F (37 C) 97.4 F (36.3 C) 98.6 F (37 C)  TempSrc: Oral Oral Oral  Oral  SpO2: 93% 94% 97% 94%  Weight: 79.1 kg (174 lb 6.1 oz)     Height:        Intake/Output  Summary (Last 24 hours) at 08/05/16 1423 Last data filed at 08/05/16 0630  Gross per 24 hour  Intake              380 ml  Output              425 ml  Net              -45 ml   Filed Weights   08/02/16 0500 08/03/16 0607 08/04/16 0617  Weight: 79.3 kg (174 lb 13.2 oz) 79.1 kg (174 lb 6.1 oz) 79.1 kg (174 lb 6.1 oz)    Examination: General exam: Ill-looking male lying on bed comfortable.  Respiratory system: Clear to auscultation. Respiratory effort normal. No wheezing or crackle Cardiovascular system: S1 & S2 heard, RRR.  No pedal edema. Gastrointestinal system: Abdomen is distended but soft, nontender. Bowel sound positive Central nervous system: Alert, awake and following commands. Upper extremities tremor. Extremities: Symmetric 5 x 5 power. Skin: No rashes, lesions or ulcers Psychiatry: Judgement and insight appear normal.  Data Reviewed: I have personally reviewed following labs and imaging studies  CBC:  Recent Labs Lab 07/31/16 1451 08/01/16 0347 08/02/16 0423 08/03/16 0508 08/04/16 0451  WBC 5.0 4.5 4.7 5.2 3.7*  HGB 9.6* 9.3* 9.3* 10.0* 12.5*  HCT 26.2* 24.4* 24.4* 26.6* 34.5*  MCV 98.9 97.6 98.4 100.0 100.3*  PLT 29* 27* 35* 41* 30*   Basic Metabolic Panel:  Recent Labs Lab 08/01/16 0347 08/02/16 0706 08/03/16 0508 08/04/16 0451 08/05/16 0318  NA 136 133* 136 135 135  K 3.9 3.4* 3.8 3.6 3.4*  CL 102 99* 101 101 100*  CO2 25 26 26 27 28   GLUCOSE 103* 119* 123* 135* 150*  BUN 32* 41* 40* 43* 38*  CREATININE 1.93* 1.88* 1.67* 1.78* 1.40*  CALCIUM 8.5* 8.3* 8.8* 8.7* 8.3*   GFR: Estimated Creatinine Clearance: 49.2 mL/min (by C-G formula based on SCr of 1.4 mg/dL (H)). Liver Function Tests:  Recent Labs Lab 07/31/16 1451 08/02/16 0706 08/03/16 0508  AST 114* 120* 148*  ALT 46 45 56  ALKPHOS 85 94 110  BILITOT 5.4* 3.0* 3.4*  PROT 6.4* 5.9* 7.2  ALBUMIN 1.9* 1.9* 2.1*    Recent Labs Lab 07/31/16 1451  LIPASE 30    Recent Labs Lab  08/02/16 1005  AMMONIA 41*   Coagulation Profile:  Recent Labs Lab 07/31/16 1451 08/01/16 0347 08/05/16 0318  INR 2.10 2.19 2.03   Cardiac Enzymes: No results for input(s): CKTOTAL, CKMB, CKMBINDEX, TROPONINI in the last 168 hours. BNP (last 3 results) No results for input(s): PROBNP in the last 8760 hours. HbA1C: No results for input(s): HGBA1C in the last 72 hours. CBG:  Recent Labs Lab 08/04/16 0740 08/04/16 1217  GLUCAP 132* 162*   Lipid Profile: No results for input(s): CHOL, HDL, LDLCALC, TRIG, CHOLHDL, LDLDIRECT in the last 72 hours. Thyroid Function Tests: No results for input(s): TSH, T4TOTAL, FREET4, T3FREE, THYROIDAB in the last 72 hours. Anemia Panel: No results for input(s): VITAMINB12, FOLATE, FERRITIN, TIBC, IRON, RETICCTPCT in the last 72 hours. Sepsis Labs: No results for input(s): PROCALCITON, LATICACIDVEN in the last 168 hours.  Recent Results (from the past 240 hour(s))  Culture, body fluid-bottle     Status: None (Preliminary result)   Collection Time: 08/01/16 10:38 AM  Result Value Ref Range  Status   Specimen Description FLUID PERITONEAL  Final   Special Requests NONE  Final   Culture   Final    NO GROWTH 3 DAYS Performed at [facility]    Report Status PENDING  Incomplete  Gram stain     Status: None   Collection Time: 08/01/16 10:38 AM  Result Value Ref Range Status   Specimen Description FLUID PERITONEAL  Final   Special Requests NONE  Final   Gram Stain   Final    RARE WBC PRESENT,BOTH PMN AND MONONUCLEAR NO ORGANISMS SEEN Performed at [facility]    Report Status 08/01/2016 FINAL  Final         Radiology Studies: No results found.      Scheduled Meds: . atorvastatin  40 mg Per Tube q1800  . chlorhexidine  15 mL Mouth Rinse BID  . feeding supplement (ENSURE ENLIVE)  237 mL Oral BID BM  . folic acid  1 mg Oral Daily  . furosemide  20 mg Intravenous Daily  . mouth rinse  15 mL Mouth Rinse q12n4p  .  metoprolol tartrate  12.5 mg Oral BID  . multivitamin with minerals  1 tablet Oral Daily  . nicotine  21 mg Transdermal Daily  . pneumococcal 23 valent vaccine  0.5 mL Intramuscular Tomorrow-1000  . rifaximin  550 mg Oral BID  . sodium chloride flush  3 mL Intravenous Q12H  . thiamine  100 mg Oral Daily   Or  . thiamine  100 mg Intravenous Daily   Continuous Infusions:   LOS: 5 days   Vance Gather, MD [facility] Pager [phone]   If 7PM-7AM, please contact night-coverage [email or web address] Password [email or web address] 08/05/2016, 2:23 PM

## 2016-08-05 NOTE — Progress Notes (Signed)
Occupational Therapy Treatment Patient Details Name: Patrick [name] MRN: [id number] DOB: [birth year] Today's Date: 08/05/2016    History of present illness  66 y.o. male with medical history significant of CAD s/p PCI, Hep C, alcoholic cirrhosis, polysubstance abuse, thrombocytopenia, and esophageal CA s/p PEG tube placement and subsequent removal; adm 07/31/16 with abdominal distention   OT comments  Pt. Was able to wash hands and face at setup level. Pt. Was Mod A with washing UE in supine with pt. Limited by B UE weakness. Pt. Was Max A with doffing and donning of hospital gown secondary to UE weakness. Pt. Needs further OT to maximize I and safety with ADLS.   Follow Up Recommendations  SNF    Equipment Recommendations       Recommendations for Other Services      Precautions / Restrictions Precautions Precautions: Fall Precaution Comments: AMS       Mobility Bed Mobility                  Transfers                      Balance                                   ADL       Grooming: Wash/dry hands;Wash/dry face;Set up   Upper Body Bathing: Moderate assistance;Bed level                             General ADL Comments: Pt. was Max A with doffing of hospital gown and was Max A to don gown. Pt. has B UE weakness and is having difficulty lifting UE against gravity.       Vision                     Perception     Praxis      Cognition   Behavior During Therapy: Flat affect Overall Cognitive Status: Impaired/Different from baseline Area of Impairment: Following commands;Problem solving        Following Commands: Follows one step commands inconsistently     Problem Solving: Slow processing;Decreased initiation;Difficulty sequencing;Requires verbal cues;Requires tactile cues      Extremity/Trunk Assessment               Exercises     Shoulder Instructions       General Comments      Pertinent  Vitals/ Pain          Home Living                                          Prior Functioning/Environment              Frequency           Progress Toward Goals  OT Goals(current goals can now be found in the care plan section)  Progress towards OT goals: Progressing toward goals  Acute Rehab OT Goals Patient Stated Goal: get stronger  Plan Discharge plan remains appropriate    Co-evaluation                 End of Session     Activity Tolerance Patient limited by lethargy;Patient limited by pain  Patient Left in bed   Nurse Communication          Time: [phone] OT Time Calculation (min): 45 min  Charges: OT General Charges $OT Visit: 1 Procedure OT Treatments $Self Care/Home Management : 38-52 mins  Ruhan Borak 08/05/2016, 9:26 AM

## 2016-08-06 ENCOUNTER — Inpatient Hospital Stay (HOSPITAL_COMMUNITY): Payer: Medicare HMO

## 2016-08-06 DIAGNOSIS — I9589 Other hypotension: Secondary | ICD-10-CM

## 2016-08-06 DIAGNOSIS — D696 Thrombocytopenia, unspecified: Secondary | ICD-10-CM

## 2016-08-06 DIAGNOSIS — Z7189 Other specified counseling: Secondary | ICD-10-CM

## 2016-08-06 DIAGNOSIS — Z515 Encounter for palliative care: Secondary | ICD-10-CM

## 2016-08-06 DIAGNOSIS — K922 Gastrointestinal hemorrhage, unspecified: Secondary | ICD-10-CM

## 2016-08-06 LAB — COMPREHENSIVE METABOLIC PANEL
ALBUMIN: 1.8 g/dL — AB (ref 3.5–5.0)
ALT: 93 U/L — AB (ref 17–63)
AST: 253 U/L — AB (ref 15–41)
Alkaline Phosphatase: 132 U/L — ABNORMAL HIGH (ref 38–126)
Anion gap: 6 (ref 5–15)
BUN: 39 mg/dL — AB (ref 6–20)
CHLORIDE: 100 mmol/L — AB (ref 101–111)
CO2: 26 mmol/L (ref 22–32)
CREATININE: 1.96 mg/dL — AB (ref 0.61–1.24)
Calcium: 8.3 mg/dL — ABNORMAL LOW (ref 8.9–10.3)
GFR calc Af Amer: 40 mL/min — ABNORMAL LOW (ref >60)
GFR calc non Af Amer: 34 mL/min — ABNORMAL LOW (ref >60)
GLUCOSE: 158 mg/dL — AB (ref 65–99)
POTASSIUM: 3.7 mmol/L (ref 3.5–5.1)
Sodium: 132 mmol/L — ABNORMAL LOW (ref 135–145)
Total Bilirubin: 2 mg/dL — ABNORMAL HIGH (ref 0.3–1.2)
Total Protein: 5.9 g/dL — ABNORMAL LOW (ref 6.5–8.1)

## 2016-08-06 LAB — CBC WITH DIFFERENTIAL/PLATELET
BASOS ABS: 0 10*3/uL (ref 0.0–0.1)
Basophils Relative: 1 %
EOS PCT: 2 %
Eosinophils Absolute: 0.1 10*3/uL (ref 0.0–0.7)
HCT: 25 % — ABNORMAL LOW (ref 39.0–52.0)
Hemoglobin: 9.3 g/dL — ABNORMAL LOW (ref 13.0–17.0)
LYMPHS ABS: 0.8 10*3/uL (ref 0.7–4.0)
Lymphocytes Relative: 24 %
MCH: 37.3 pg — ABNORMAL HIGH (ref 26.0–34.0)
MCHC: 37.2 g/dL — ABNORMAL HIGH (ref 30.0–36.0)
MCV: 100.4 fL — ABNORMAL HIGH (ref 78.0–100.0)
MONO ABS: 0.7 10*3/uL (ref 0.1–1.0)
MONOS PCT: 21 %
NEUTROS PCT: 52 %
Neutro Abs: 1.8 10*3/uL (ref 1.7–7.7)
PLATELETS: 43 10*3/uL — AB (ref 150–400)
RBC: 2.49 MIL/uL — AB (ref 4.22–5.81)
RDW: 21.9 % — ABNORMAL HIGH (ref 11.5–15.5)
WBC: 3.4 10*3/uL — AB (ref 4.0–10.5)

## 2016-08-06 LAB — BASIC METABOLIC PANEL
ANION GAP: 8 (ref 5–15)
BUN: 37 mg/dL — ABNORMAL HIGH (ref 6–20)
CALCIUM: 8.9 mg/dL (ref 8.9–10.3)
CHLORIDE: 98 mmol/L — AB (ref 101–111)
CO2: 28 mmol/L (ref 22–32)
Creatinine, Ser: 1.92 mg/dL — ABNORMAL HIGH (ref 0.61–1.24)
GFR calc non Af Amer: 35 mL/min — ABNORMAL LOW (ref >60)
GFR, EST AFRICAN AMERICAN: 41 mL/min — AB (ref >60)
Glucose, Bld: 163 mg/dL — ABNORMAL HIGH (ref 65–99)
POTASSIUM: 3.9 mmol/L (ref 3.5–5.1)
Sodium: 134 mmol/L — ABNORMAL LOW (ref 135–145)

## 2016-08-06 LAB — CBC
HEMATOCRIT: 20.8 % — AB (ref 39.0–52.0)
HEMOGLOBIN: 7.7 g/dL — AB (ref 13.0–17.0)
MCH: 37 pg — AB (ref 26.0–34.0)
MCHC: 37 g/dL — ABNORMAL HIGH (ref 30.0–36.0)
MCV: 100 fL (ref 78.0–100.0)
Platelets: 39 10*3/uL — ABNORMAL LOW (ref 150–400)
RBC: 2.08 MIL/uL — AB (ref 4.22–5.81)
RDW: 22 % — ABNORMAL HIGH (ref 11.5–15.5)
WBC: 3 10*3/uL — ABNORMAL LOW (ref 4.0–10.5)

## 2016-08-06 LAB — CULTURE, BODY FLUID-BOTTLE: CULTURE: NO GROWTH

## 2016-08-06 LAB — PROTIME-INR
INR: 1.91
Prothrombin Time: 22.1 seconds — ABNORMAL HIGH (ref 11.4–15.2)

## 2016-08-06 LAB — CULTURE, BODY FLUID W GRAM STAIN -BOTTLE

## 2016-08-06 LAB — PREPARE RBC (CROSSMATCH)

## 2016-08-06 LAB — MRSA PCR SCREENING: MRSA BY PCR: NEGATIVE

## 2016-08-06 LAB — ABO/RH: ABO/RH(D): O POS

## 2016-08-06 MED ORDER — SODIUM CHLORIDE 0.9 % IV SOLN
Freq: Once | INTRAVENOUS | Status: AC
  Administered 2016-08-06: 23:00:00 via INTRAVENOUS

## 2016-08-06 MED ORDER — FUROSEMIDE 10 MG/ML IJ SOLN: 20.0000 mg | Freq: Once | INTRAMUSCULAR | Status: DC

## 2016-08-06 MED ORDER — METHYLPREDNISOLONE SODIUM SUCC 125 MG IJ SOLR
125.0000 mg | Freq: Two times a day (BID) | INTRAMUSCULAR | Status: DC
  Administered 2016-08-06 – 2016-08-09 (×7): 125 mg via INTRAVENOUS
  Filled 2016-08-06 (×7): qty 2

## 2016-08-06 MED ORDER — ENSURE ENLIVE PO LIQD
237.0000 mL | Freq: Every day | ORAL | Status: DC
  Administered 2016-08-06 – 2016-08-13 (×29): 237 mL via ORAL

## 2016-08-06 MED ORDER — ALBUMIN HUMAN 25 % IV SOLN
1.5000 g/kg | Freq: Once | INTRAVENOUS | Status: AC
  Administered 2016-08-06: 121.25 g via INTRAVENOUS
  Filled 2016-08-06: qty 500

## 2016-08-06 NOTE — Progress Notes (Signed)
PT Cancellation Note  Patient Details Name: Patrick [name] MRN: [id number] DOB: [birth year]   Cancelled Treatment:    Reason Eval/Treat Not Completed: Patient medical issues prohibit therapy (hold PT for today per RN); pt had 13 beat run of Vtach this am and  BP also very low   [facility] 08/06/2016, 10:15 AM

## 2016-08-06 NOTE — Progress Notes (Signed)
BP low for am med administration, held Lasix and Lopressor, per discussion with MD (Dr. Bonner Puna). SRP, RN

## 2016-08-06 NOTE — Progress Notes (Signed)
Daily Progress Note   Patient Name: Patrick [name]       Date: 08/06/2016 DOB: 12/23/1950  Age: 66 y.o. MRN#: [id number] Attending Physician: Patrecia Pour, MD Primary Care Physician: Javier Docker, MD Admit Date: 07/31/2016  Reason for Consultation/Follow-up: Establishing goals of care  Subjective: Patient's sister, Patrick [name], returned my call and we discussed Patrick [name]'s clinical situation including new development of hypotension.  She is coming in to town this evening and we are planning to meet tomorrow at 1000.  She reports that their step-father was in the ICU prior to his death and her brother had talked with her following this and stated that he would never want to be put on a ventilator nor have aggressive interventions such as CPR when he dies.  I met with Patrick [name] and we reviewed his clinical course.  We discussed plan to transition to stepdown unit.  We also discussed his wishes for CODE STATUS.  See below.  Length of Stay: 6  Current Medications: Scheduled Meds:  . atorvastatin  40 mg Per Tube q1800  . chlorhexidine  15 mL Mouth Rinse BID  . feeding supplement (ENSURE ENLIVE)  237 mL Oral 5 X Daily  . folic acid  1 mg Oral Daily  . furosemide  20 mg Intravenous Daily  . mouth rinse  15 mL Mouth Rinse q12n4p  . methylPREDNISolone (SOLU-MEDROL) injection  125 mg Intravenous Q12H  . metoprolol tartrate  12.5 mg Oral BID  . multivitamin with minerals  1 tablet Oral Daily  . nicotine  21 mg Transdermal Daily  . pneumococcal 23 valent vaccine  0.5 mL Intramuscular Tomorrow-1000  . rifaximin  400 mg Oral Q8H  . sodium chloride flush  3 mL Intravenous Q12H  . thiamine  100 mg Oral Daily   Or  . thiamine  100 mg Intravenous Daily    Continuous Infusions:   PRN  Meds: acetaminophen **OR** acetaminophen, ondansetron **OR** ondansetron (ZOFRAN) IV, oxyCODONE  Physical Exam   General: Ill-appearing male, slow to answer questions, but answers appropriately, no acute distress.  HEENT: No bruits, no goiter, no JVD Heart: Regular rate and rhythm. No murmur appreciated. Lungs: Good air movement, clear Abdomen: Soft, nontender, distended, positive bowel sounds.  Ext: + LE edema Skin: Warm and dry  Vital Signs: BP 97/63 (BP Location: Left Arm)   Pulse (!) 102   Temp 98.3 F (36.8 C) (Oral)   Resp 18   Ht _0  (1.702 m)   Wt 80.9 kg (178 lb 5.6 oz)   SpO2 97%   BMI 27.93 kg/m  SpO2: SpO2: 97 % O2 Device: O2 Device: Not Delivered O2 Flow Rate:    Intake/output summary:  Intake/Output Summary (Last 24 hours) at 08/06/16 1608 Last data filed at 08/06/16 1446  Gross per 24 hour  Intake              520 ml  Output              250 ml  Net              270 ml   LBM: Last BM Date: 08/01/16 Baseline Weight: Weight: 78.9 kg (173 lb 15.1 oz) Most recent weight: Weight: 80.9 kg (178 lb 5.6 oz)       Palliative Assessment/Data:    Flowsheet Rows   Flowsheet Row Most Recent Value  Intake Tab  Referral Department  Hospitalist  Unit at Time of Referral  Med/Surg Unit  Palliative Care Primary Diagnosis  Other (Comment) [Hepatic]  Date Notified  08/05/16  Palliative Care Type  New Palliative care  Reason for referral  Clarify Goals of Care  Date of Admission  07/31/16  Date first seen by Palliative Care  08/05/16  # of days Palliative referral response time  0 Day(s)  # of days IP prior to Palliative referral  5  Clinical Assessment  Palliative Performance Scale Score  50%  Pain Max last 24 hours  7  Pain Min Last 24 hours  0  Psychosocial & Spiritual Assessment  Palliative Care Outcomes  Patient/Family meeting held?  Yes  Who was at the meeting?  Patient  Palliative Care Outcomes  Provided advance care planning, ACP counseling  assistance      Patient Active Problem List   Diagnosis Date Noted  . Acute liver failure 08/01/2016  . Ascites due to alcoholic cirrhosis ([place]) 85/27/7824  . Acute renal failure ([place]) 08/01/2016  . Melena 08/01/2016  . Protein-calorie malnutrition, severe ([place]) 08/01/2016  . Alcohol withdrawal ([place]) 08/01/2016  . 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 Alcoholic peripheral neuropathy ([place]) 10/19/2015  . Chemotherapy-induced neuropathy ([place]) 10/19/2015  . Constipation 08/03/2015  . Cancer associated pain 08/03/2015  . Nausea with vomiting 08/03/2015  . Acute confusion   . Esophageal dysphagia 06/26/2015  . Acute encephalopathy 06/26/2015  . 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 Cancer of thoracic esophagus ([place]) 05/11/2015  . Alcohol-induced acute pancreatitis   . Abnormal CT scan, esophagus   . Abnormal weight loss   . Dysphagia, pharyngoesophageal phase   . Fall 04/26/2015  . Numbness 04/26/2015  . Protein-calorie malnutrition, moderate ([place]) 04/26/2015  . Weakness 04/26/2015  . Abdominal pain 04/26/2015  . Pancreatitis 04/26/2015  . Alcohol abuse   . Dysphagia   . Prolonged QT interval   . Essential hypertension   . Polysubstance abuse 09/03/2012  . Leukopenia and Thrombocytopenia 01/02/2012  . Chronic hepatitis C with cirrhosis ([place]) 01/02/2012  . Alcoholism ([place]) 01/02/2012  . Barrett's esophagus 11/17/2011  . History of colonic polyps 11/11/2011  . Thrombocytopenia ([place]) 03/03/2011  . Chest pain 12/03/2010  . CAD (coronary artery disease) 12/03/2010  . Hyperlipidemia 12/03/2010  . Tobacco abuse 12/03/2010  . Cocaine  abuse 12/03/2010    Palliative Care Assessment & Plan   Patient Profile: 66 y.o. male  with past medical history of CAD s/p PCI, Hep C, alcoholic cirrhosis, polysubstance abuse, thrombocytopenia, and esophageal CA s/p PEG tube placement and subsequent removal admitted on  07/31/2016 with hepatic failure, renal failure.  Palliative consulted for goals of   Recommendations/Plan:  I called and talked with his sister, Patrick [name].  She is on her way to the hospital and we are planning family meeting for tomorrow at 63.  Patrick [name] has been clear that his sister is the only family he wants [place] to speak with concerning his care.  His sister reports that he has expressed that he does not ever want to be put on life support or have CPR.  I discussed with Patrick [name] regarding heroic interventions at the end-of-life. He does not ever want to be put on a ventilator. He does not ever want CPR.  He does not ever want defibrillation.  He is ok with a trial of pressors or Bipap if necessary, but would not want either of these for a prolonged period if it does not appear that he will recover to a point where he will be able to live independently.  I have changed his code status to partial code to reflect his wishes.  Appreciate Chaplain working to establish his sister as his 69.   Plan for family meeting tomorrow at 36. Code Status:    Code Status Orders        Start     Ordered   08/06/16 1547  Limited resuscitation (code)  Continuous    Question Answer Comment  In the event of cardiac or respiratory ARREST: Initiate Code Blue, Call Rapid Response Yes   In the event of cardiac or respiratory ARREST: Perform CPR No   In the event of cardiac or respiratory ARREST: Perform Intubation/Mechanical Ventilation No   In the event of cardiac or respiratory ARREST: Use NIPPV/BiPAp only if indicated Yes   In the event of cardiac or respiratory ARREST: Administer ACLS medications if indicated Yes   In the event of cardiac or respiratory ARREST: Perform Defibrillation or Cardioversion if indicated No   Comments Patient is OK with trial of Bipap or pressors      08/06/16 1547    Code Status History    Date Active Date Inactive Code Status Order ID Comments User Context    07/31/2016  9:55 PM 08/06/2016  3:47 PM Full Code [id number]  Karmen Bongo, MD Inpatient   06/26/2015  2:41 PM 06/29/2015  7:49 PM Full Code [id number]  Gatha Mayer, MD Inpatient   04/26/2015  6:29 AM 04/30/2015  4:24 PM Full Code [id number]  Ivor Costa, MD ED   03/02/2015  6:47 PM 03/03/2015  9:08 PM Full Code [id number]  Brett Canales, PA-C Inpatient   11/29/2012  9:13 PM 11/30/2012  6:23 PM Full Code [id number]  Darl Pikes, MD Inpatient       Prognosis:   Unable to determine  Discharge Planning:  To Be Determined  Care plan was discussed with patient, RN, Dr Bonner Puna, Pt sister, Patrick [name]  Thank you for allowing the Palliative Medicine Team to assist in the care of this patient.   Time In: 1320 Time Out: 1400 Total Time 40 Prolonged Time Billed No      Greater than 50%  of this time was spent counseling and coordinating care related to the above assessment and plan.  Micheline Rough, MD  Please contact Palliative Medicine Team phone at [phone] for questions and concerns.

## 2016-08-06 NOTE — Progress Notes (Signed)
Last night at 2130, patient's BP was 75/49 manual, and HR 86. No dizziness. On call made aware. New orders given for 250cc bolus. Once bolus was finished, BP rechecked- 77/45 manual, HR 80's, on call made aware. New order given for another 250cc bolus. At 2335, BP was 79/45 after second bolus. On call notified and stated to recheck at 0200. At 0200, BP was 80/50. On call notified and new orders were given to recheck every 2 hrs manually and call if less than 80.

## 2016-08-06 NOTE — Progress Notes (Signed)
PROGRESS NOTE  Patrick [name]  [id number] DOB: 05/08/1951 DOA: 07/31/2016 PCP: Javier Docker, MD  Brief Narrative: 66 y.o.malewith medical history significant of CAD s/p PCI, Hep C, alcoholic cirrhosis, polysubstance abuse, thrombocytopenia, and esophageal CA s/p PEG tube placement and subsequent removal presenting with "stomach swelling up". Has been happening for over a month now.+N/V. No hematemesis. Decreased PO intake.Had a feeding tube (for h/o esophageal cancer) but it was removed over a month ago. Last drink was before Christmas. Generally drinks 1/5 per day. +tremors.   Assessment & Plan: # Acute liver failure with ascites due to chronic alcoholic cirrhosis: Child-Pugh score 12 (Class C), INR 2.10 at admission. 3L transudative paracentesis 12/29 without evidence of SBP. - Hold lasix/spironolactone due to hypotension. - Ammonia mildly elevated at 41, so rifaximin was started 1/1 - Monitor resultant thrombocytopenia and coagulopathy - Palliative care consulted, attempting to call family. - Jan 3: Decompensation with hypotension and acute renal failure. 100cc UOP/8hrs. Will start empiric stress steroids. CCM consulted.   # Acute renal failure likely in the setting of ascites and liver disease versus  progressive chronic kidney disease. UA unremarkable. CT scan of abdomen showed atrophic right kidney and nonobstructive 3 mm stone in the left kidney with no left hydroureter. - Continue to monitor BMP.  - Avoid nephrotoxins. - Refractory renal failure with hypotension: Giving albumin challenge 1.5g/kg, will recheck metabolic panel.  # Melena: Heme positive in ED without gross GI bleeding since that time. Hemoglobin is stable now with no sign of active bleeding. Patient has coagulopathy with elevated INR and thrombocytopenia. Currently hemodynamically stable.  - Given vitamin K 10mg  x2. - Holding aspirin given thrombocytopenia.  # Anemia: Acute on chronic. Stable hgb at 9.3.  No signs of bleeding - Will recheck CBC now.  #Thrombocytopenia: Chronic, stable - Monitor CBC. No sign of active bleeding.  # Prolonged QT -Will need to judiciously use medications which may increase the QT interval -Continue on tele  #Alcohol withdrawal: Patient with chronic ETOH dependence. Reports last drink was before Christmas. - Refer to outpatient rehabilitation. - Will continue CIWA, vitamins and supportive care.   # Mild to moderate malnutrition:  - Nutrition consulted  # Tobacco dependence - Encourage cessation.  - Nicotine patch ordered  # H/o feeding tube and dysphagia from esophageal cancer - SLP eval appreciated currently on dysphagia diet. Continue to monitor.  #Physical deconditioning:  - PT recommended SNF but patient declines - hoping family will be able to help with this. They are coming in from out of town soon. - Given multiple comorbidities, consulted for palliative care service.  DVT prophylaxis: SCD. No anticoagulation because of coagulopathy and thrombocytopenia Code Status: Full code Family Communication: Discussed at length with daughter by phone who will be traveling from [place] in the next day or two.  Disposition Plan: Transfer to SDU and consult CCM for refractory hypotension.  Consultants:   Palliative care  CCM  Procedures: U/S-guided paracentesis Antimicrobials: None  Subjective: No events overnight per pt. Still with neuropathic leg pain and sore/fullness in abdomen. Remains feeling very weak. Denies fever, chills, chest pain, dyspnea, N/V/D.  Objective: Vitals:   08/05/16 2334 08/06/16 0209 08/06/16 0411 08/06/16 0602  BP: (!) 79/45 (!) 80/50 (!) 85/52 (!) 90/52  Pulse: 85  90   Resp:   16   Temp:   98.3 F (36.8 C)   TempSrc:   Oral   SpO2:   96%   Weight:    80.9 kg (178  lb 5.6 oz)  Height:        Intake/Output Summary (Last 24 hours) at 08/06/16 0732 Last data filed at 08/06/16 0419  Gross per 24 hour  Intake               400 ml  Output              900 ml  Net             -500 ml   Filed Weights   08/03/16 0607 08/04/16 0617 08/06/16 0602  Weight: 79.1 kg (174 lb 6.1 oz) 79.1 kg (174 lb 6.1 oz) 80.9 kg (178 lb 5.6 oz)    Examination: General exam: Ill-looking male lying on bed comfortable.  Respiratory system: Clear to auscultation. Respiratory effort normal. No wheezing or crackle Cardiovascular system: S1 & S2 heard, RRR.  No pedal edema. Gastrointestinal system: Abdomen is distended but soft, nontender. Bowel sound positive Central nervous system: Alert, awake and following commands. Upper extremities tremor. Extremities: Symmetric 5 x 5 power. Skin: No rashes, lesions or ulcers Psychiatry: Judgement and insight appear normal.  Data Reviewed: I have personally reviewed following labs and imaging studies  CBC:  Recent Labs Lab 08/01/16 0347 08/02/16 0423 08/03/16 0508 08/04/16 0451 08/06/16 0525  WBC 4.5 4.7 5.2 3.7* 3.4*  NEUTROABS  --   --   --   --  1.8  HGB 9.3* 9.3* 10.0* 12.5* 9.3*  HCT 24.4* 24.4* 26.6* 34.5* 25.0*  MCV 97.6 98.4 100.0 100.3* 100.4*  PLT 27* 35* 41* 30* 43*   Basic Metabolic Panel:  Recent Labs Lab 08/02/16 0706 08/03/16 0508 08/04/16 0451 08/05/16 0318 08/06/16 0525  NA 133* 136 135 135 132*  K 3.4* 3.8 3.6 3.4* 3.7  CL 99* 101 101 100* 100*  CO2 26 26 27 28 26   GLUCOSE 119* 123* 135* 150* 158*  BUN 41* 40* 43* 38* 39*  CREATININE 1.88* 1.67* 1.78* 1.40* 1.96*  CALCIUM 8.3* 8.8* 8.7* 8.3* 8.3*   GFR: Estimated Creatinine Clearance: 38.3 mL/min (by C-G formula based on SCr of 1.96 mg/dL (H)). Liver Function Tests:  Recent Labs Lab 07/31/16 1451 08/02/16 0706 08/03/16 0508 08/06/16 0525  AST 114* 120* 148* 253*  ALT 46 45 56 93*  ALKPHOS 85 94 110 132*  BILITOT 5.4* 3.0* 3.4* 2.0*  PROT 6.4* 5.9* 7.2 5.9*  ALBUMIN 1.9* 1.9* 2.1* 1.8*    Recent Labs Lab 07/31/16 1451  LIPASE 30    Recent Labs Lab 08/02/16 1005  AMMONIA  41*   Coagulation Profile:  Recent Labs Lab 07/31/16 1451 08/01/16 0347 08/05/16 0318 08/06/16 0525  INR 2.10 2.19 2.03 1.91   Cardiac Enzymes: No results for input(s): CKTOTAL, CKMB, CKMBINDEX, TROPONINI in the last 168 hours. BNP (last 3 results) No results for input(s): PROBNP in the last 8760 hours. HbA1C: No results for input(s): HGBA1C in the last 72 hours. CBG:  Recent Labs Lab 08/04/16 0740 08/04/16 1217  GLUCAP 132* 162*   Lipid Profile: No results for input(s): CHOL, HDL, LDLCALC, TRIG, CHOLHDL, LDLDIRECT in the last 72 hours. Thyroid Function Tests: No results for input(s): TSH, T4TOTAL, FREET4, T3FREE, THYROIDAB in the last 72 hours. Anemia Panel: No results for input(s): VITAMINB12, FOLATE, FERRITIN, TIBC, IRON, RETICCTPCT in the last 72 hours. Sepsis Labs: No results for input(s): PROCALCITON, LATICACIDVEN in the last 168 hours.  Recent Results (from the past 240 hour(s))  Culture, body fluid-bottle     Status: None (Preliminary result)  Collection Time: 08/01/16 10:38 AM  Result Value Ref Range Status   Specimen Description FLUID PERITONEAL  Final   Special Requests NONE  Final   Culture   Final    NO GROWTH 4 DAYS Performed at [facility]    Report Status PENDING  Incomplete  Gram stain     Status: None   Collection Time: 08/01/16 10:38 AM  Result Value Ref Range Status   Specimen Description FLUID PERITONEAL  Final   Special Requests NONE  Final   Gram Stain   Final    RARE WBC PRESENT,BOTH PMN AND MONONUCLEAR NO ORGANISMS SEEN Performed at [facility]    Report Status 08/01/2016 FINAL  Final         Radiology Studies: No results found.      Scheduled Meds: . atorvastatin  40 mg Per Tube q1800  . chlorhexidine  15 mL Mouth Rinse BID  . feeding supplement (ENSURE ENLIVE)  237 mL Oral BID BM  . folic acid  1 mg Oral Daily  . furosemide  20 mg Intravenous Daily  . mouth rinse  15 mL Mouth Rinse q12n4p  .  metoprolol tartrate  12.5 mg Oral BID  . multivitamin with minerals  1 tablet Oral Daily  . nicotine  21 mg Transdermal Daily  . pneumococcal 23 valent vaccine  0.5 mL Intramuscular Tomorrow-1000  . rifaximin  400 mg Oral Q8H  . sodium chloride flush  3 mL Intravenous Q12H  . thiamine  100 mg Oral Daily   Or  . thiamine  100 mg Intravenous Daily   Continuous Infusions:   LOS: 6 days   Vance Gather, MD [facility] Pager [phone]   If 7PM-7AM, please contact night-coverage [email or web address] Password [email or web address] 08/06/2016, 7:32 AM

## 2016-08-06 NOTE — Progress Notes (Signed)
Pt talking and tolerating Albumin infusing without issues, VSs unchanged current BP 91/59 90. SRP, RN

## 2016-08-06 NOTE — Progress Notes (Signed)
Patient had a 13 beat run of Vtach this AM. On call made aware. No new orders given.

## 2016-08-06 NOTE — Consult Note (Signed)
PULMONARY / CRITICAL CARE MEDICINE   Name: Zaydn Mccants MRN: [id number] DOB: January 23, 1951    ADMISSION DATE:  07/31/2016 CONSULTATION DATE:  08/07/15  REFERRING MD:  Dr. Bonner Puna   CHIEF COMPLAINT:  ABD distention   HISTORY OF PRESENT ILLNESS:   66 year old male with PMH of CAD s/p PCI, Hep C, alcoholic cirrhosis (last drink before Christmas, generally drinks 1/5 per day), polysubstance abuse, thrombocytopenia, and esophageal CA, s/p PEG tube placement (removed November). Presented 12/28 with one month of progressive abdominal distention, worsening LFTs, and heme positive stools. Abdominal distension due to ascites and he successfully had 3L drained via paracentesis 12/29. Hospital course since that time has been complicated by ongoing ascites, confusion, and worsening blood counts. HBG, and platelets have been steadily declining. He has been seen by palliative and has family discussion planned for 1/4 10am. 1/3 he acutely decompensated with hypotension and was transferred to SDU. He was given 120g of albumin in attempts at resuscitation, however, he remained hypotensive. PCCM to see.   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  PAST SURGICAL HISTORY: He  has a past surgical history that includes Hernia repair; Balloon angioplasty, artery (03/2010); Colonoscopy (11/11/11); Esophagogastroduodenoscopy (11/11/11); Cardiac catheterization  (01/31/2011); Coronary angioplasty; Cataract extraction w/PHACO (08/18/2012); Cataract extraction w/PHACO (Left, 09/22/2012); Upper gastrointestinal endoscopy; Esophagogastroduodenoscopy (egd) with propofol (N/A, 04/27/2015); and PEG placement (N/A, 06/26/2015).  No Known Allergies  No current facility-administered medications on file prior to encounter.    Current Outpatient Prescriptions on File Prior to Encounter  Medication Sig  . aspirin 81 MG tablet Place 1 tablet (81 mg total) into feeding tube daily.  Marland Kitchen atorvastatin (LIPITOR) 40 MG tablet Place 1 tablet (40 mg total) into feeding tube daily at 6 PM.  . lisinopril (PRINIVIL,ZESTRIL) 20 MG tablet Place 1 tablet (20 mg total) into feeding tube daily.  . Oxycodone HCl 10 MG TABS Take 10 mg by mouth daily as needed (pain).   . carvedilol (COREG) 12.5 MG tablet Place 1 tablet (12.5 mg total) into feeding tube 2 (two) times daily with a meal. (Patient not taking: Reported on 07/31/2016)  . Multiple Vitamin (MULTIVITAMIN WITH MINERALS) TABS tablet Place 1 tablet into feeding tube daily.  . nitroGLYCERIN (NITROSTAT) 0.4 MG SL tablet Place 1 tablet (0.4 mg total) under the tongue every 5 (five) minutes as needed for chest pain.    FAMILY HISTORY:    SOCIAL HISTORY: He  reports that he quit smoking 10 days ago. His smoking use included Cigarettes. He has a 4.50 pack-year smoking history. He has never used smokeless tobacco. He reports that he drinks alcohol. He reports that he does not use drugs.  REVIEW OF SYSTEMS:   Bolds are positive  Constitutional: weight loss, gain, night sweats, Fevers, chills, fatigue .  HEENT: headaches, Sore throat, sneezing, nasal congestion, post nasal drip, Difficulty swallowing, Tooth/dental problems, visual complaints visual changes, ear ache CV:  chest pain, radiates: ,Orthopnea, PND, swelling in lower extremities, dizziness, palpitations, syncope.  GI  heartburn, indigestion, mild abdominal pain, nausea,  vomiting, diarrhea, change in bowel habits, loss of appetite, bloody  stools.  Resp: cough, productive: , hemoptysis, dyspnea, chest pain, pleuritic.  Skin: rash or itching or icterus GU: dysuria, change in color of urine, urgency or frequency. flank pain, hematuria  MS: joint pain or swelling. decreased range of motion, neuropathic pain BLE Psych: change in mood or affect. depression or anxiety.  Neuro: difficulty with speech, weakness, numbness, ataxia    SUBJECTIVE:    VITAL SIGNS: BP (!) 104/56   Pulse (!) 111   Temp 98.3 F (36.8 C) (Oral)   Resp (!) 25   Ht 5\' 9"  (1.753 m)   Wt 84.9 kg (187 lb 2.7 oz)   SpO2 (!) 88%   BMI 27.64 kg/m   HEMODYNAMICS:    VENTILATOR SETTINGS:    INTAKE / OUTPUT: I/O last 3 completed shifts: In: 660 [P.O.:660] Out: 1325 [Urine:1325]  PHYSICAL EXAMINATION: General:  Male in no acute distress Neuro: Alert, oriented, some confusion HEENT:  [place]/AT, PERRL, no JVD Cardiovascular: RRR, no MRG Lungs: Clear Abdomen:  Distended, non-tender. C/w ascites Musculoskeletal:  No acute deformity or edema Skin:  Grossly intact  LABS:  BMET  Recent Labs Lab 08/05/16 0318 08/06/16 0525 08/06/16 1524  NA 135 132* 134*  K 3.4* 3.7 3.9  CL 100* 100* 98*  CO2 28 26 28   BUN 38* 39* 37*  CREATININE 1.40* 1.96* 1.92*  GLUCOSE 150* 158* 163*    Electrolytes  Recent Labs Lab 08/05/16 0318 08/06/16 0525 08/06/16 1524  CALCIUM 8.3* 8.3* 8.9    CBC  Recent Labs Lab 08/04/16 0451 08/06/16 0525 08/06/16 1524  WBC 3.7* 3.4* 3.0*  HGB 12.5* 9.3* 7.7*  HCT 34.5* 25.0* 20.8*  PLT 30* 43* 39*    Coag's  Recent Labs Lab 08/01/16 0347 08/05/16 0318 08/06/16 0525  INR 2.19 2.03 1.91    Sepsis Markers No results for input(s): LATICACIDVEN, PROCALCITON, O2SATVEN in the last 168 hours.  ABG No results for input(s): PHART, PCO2ART, PO2ART in the last 168 hours.  Liver Enzymes  Recent Labs Lab 08/02/16 0706 08/03/16 0508  08/06/16 0525  AST 120* 148* 253*  ALT 45 56 93*  ALKPHOS 94 110 132*  BILITOT 3.0* 3.4* 2.0*  ALBUMIN 1.9* 2.1* 1.8*    Cardiac Enzymes No results for input(s): TROPONINI, PROBNP in the last 168 hours.  Glucose  Recent Labs Lab 08/04/16 0740 08/04/16 1217  GLUCAP 132* 162*    Imaging No results found.   STUDIES:  [place] ABD 12/28 > cirrhosis, no flow demonstrated in portal vein by doppler  CT ABD/Pelvis > ascites, Markedly heterogeneous liver with nodular contour and asymmetric enlargement lateral segment left lobe, Stable appearance atrophic right kidney with several calcified parenchymal lesions  CULTURES: Peritoneal Fluid 12/29 > WBC present, no organisms seen   ANTIBIOTICS: Rifaximin 12/30 >   SIGNIFICANT EVENTS: 12/29 > Paracentesis > Removal 3L  LINES/TUBES:   DISCUSSION: 66 year old male with cirrhosis secondary to ETOH abuse and hepatitis C. Admitted for abd distension and underwent para 12/29 with 3L drained. 1/3 became hypotensive despite albumin administration. HGB and platelets dropping slowly. INR elevated due to hepatic disease.  ASSESSMENT / PLAN:  Hypotension - improved. Etiology not entirely clear, however, based on declining blood counts could have slow bleed either GI in origin, or related to paracentesis. GI bleeding noted in ED, no obvious bleeding since then. BP Recovered somewhat with administration of albumin. Doubt septic.  Anemia - acute on chronic with suspected ABLA  - Transfuse one unit PRBC and 1 unit platelets  now - INR 1.9, little benefit from FFP, continue vitamin K.  - Repeat CBC after transfusions - Consider GI consultation  Hepatic failure secondary to alcoholic cirrhosis, hepatitis C Ascites - management per primary - holding diuresis due to hypotension - Palliative care meeting 1/4 - TIPS candidate?  AKI - likely secondary to hepatic failure - per primary   Mother and sister updated at bedside.    Georgann Housekeeper,  AGACNP-BC [facility] Pulmonology/Critical Care Pager [phone] or [phone]  08/06/2016 8:36 PM

## 2016-08-06 NOTE — Progress Notes (Signed)
Nutrition Follow-up  DOCUMENTATION CODES:   Severe malnutrition in context of chronic illness  INTERVENTION:  Increase Ensure Enlive po to 5 times daily, each supplement provides 350 kcal and 20 grams of protein.  Continue Magic cup TID with meals, each supplement provides 290 kcal and 9 grams of protein. Patient is not eating these regularly, though, per report.  Continue multivitamin with minerals daily.  Patient now day 6 of known inadequate intake since admission. Increasing Ensure to help meet calorie/protein needs. If patient to remain full code/full scope treatment and is unable to meet needs through PO intake, recommend placement of NG tube for initiation of enteral nutrition by 1/7 (day 10). If patient pursues TF would recommend goal regimen of Osmolite 1.5 @ 60 ml/hr + Pro-Stat 30 ml daily. Provides 2260 kcal, 105 grams protein, 1094 ml H2O daily.  NUTRITION DIAGNOSIS:   Malnutrition related to chronic illness as evidenced by severe depletion of body fat, severe depletion of muscle mass.  Ongoing.  GOAL:   Patient will meet greater than or equal to 90% of their needs  Not met.   MONITOR:   PO intake, Supplement acceptance  REASON FOR ASSESSMENT:   Malnutrition Screening Tool, Consult Assessment of nutrition requirement/status  ASSESSMENT:   66 y.o. male with medical history significant of CAD s/p PCI, Hep C, alcoholic cirrhosis, polysubstance abuse, thrombocytopenia, and esophageal CA s/p PEG tube placement and subsequent removal presenting with "stomach swelling up".  Has been happening for over a month now.  +n/v.  No hematemesis.  Decreased PO intake.  Had a feeding tube (for h/o esophageal cancer) but it was removed over a month ago.  Diarrhea stools for days.  2-4 stools/day.  +subjective fever.   -Patient s/p US paracentesis for ascites yielding 3 L yellow-colored fluid on 12/29. -Per SLP eval 1/1 recommended patient be on dysphagia 1 diet with thin  liquids. -Palliative medicine is following patient. He remains full code/full scope treatment pending continued conversation with patient's sister.  -Noted in chart that PEG tube was placed 06/26/2015 and removed on 10/30/2015 due to tolerance of diet, no evidence of disease on endoscopy/biopsies. It appears patient had requested it be removed.   Spoke with patient at bedside. His appetite remains poor. He is experiencing nausea and abdominal pain. He reports he is only having bites of meals but does like the Ensure. He has tried [employer] but is not eating them regularly.  Meal Completion: 0-25% In the past 24 hours patient has had 770 kcal (39% minimum estimated kcal needs) and 43 grams of protein (46% minimum estimated protein needs).   Medications reviewed and include: folic acid 1 mg daily, Lasix 20 mg daily, multivitamin with minerals daily, thiamine 100 mg daily.  Labs reviewed: Sodium 132, Chloride 100, BUN 39, Creatinine 1.96, Alk Phos 132, AST 253, ALT 93.  Weight trend: patient currently 80.9 kg, which is +2 kg from admission (likely in setting of fluid/ascites)  Discussed with RN.   Diet Order:  DIET - DYS 1 Room service appropriate? Yes; Fluid consistency: Thin  Skin:  Reviewed, no issues  Last BM:  12/29- melena  Height:   Ht Readings from Last 1 Encounters:  07/31/16 '5\' 7"'$  (1.702 m)    Weight:   Wt Readings from Last 1 Encounters:  08/06/16 178 lb 5.6 oz (80.9 kg)    Ideal Body Weight:  67.2 kg  BMI:  Body mass index is 27.93 kg/m.  Estimated Nutritional Needs:   Kcal:  2000-2300kcal/day   Protein:  94-108g/day   Fluid:  2L/day   EDUCATION NEEDS:   No education needs identified at this time  Willey Blade, MS, RD, LDN Pager: [phone] After Hours Pager: [phone]

## 2016-08-06 NOTE — Progress Notes (Signed)
Reported all am VS to MD, pt tol Albumin

## 2016-08-06 NOTE — Progress Notes (Signed)
Albumin started as order. Will monito VS throughout each transfusion. SRP, RN

## 2016-08-06 NOTE — Progress Notes (Signed)
Urine output 100cc for an 8 hrs. MD notified. SRP, RN

## 2016-08-06 NOTE — Care Management Important Message (Signed)
Important Message  Patient Details  Name: Wain Murano MRN: [id number] Date of Birth: Dec 24, 1950   Medicare Important Message Given:  Yes    Kerin Salen 08/06/2016, 11:59 AMImportant Message  Patient Details  Name: Rumi Flexer MRN: [id number] Date of Birth: Apr 21, 1951   Medicare Important Message Given:  Yes    Kerin Salen 08/06/2016, 11:59 AM

## 2016-08-06 NOTE — Progress Notes (Addendum)
Chaplain following for consult around advance directives.  In conversation with MD, Patrick [name], it is determined that pt has capacity to complete Adv. Dir.    Chaplain provided paperwork and brief education around Adv. Dir.  Pt responded appropriately and expressed understanding.  Stated he wished to wait for his sister to be present to complete.   Sister will be present in morning.     Chaplain will follow up to complete Adv. Dir. In AM.    [place], [place]

## 2016-08-06 NOTE — Progress Notes (Signed)
Pt complete Albumin as scheduled, BP 98/66 108, tol infusion. SRP, RN

## 2016-08-07 DIAGNOSIS — F10239 Alcohol dependence with withdrawal, unspecified: Secondary | ICD-10-CM

## 2016-08-07 DIAGNOSIS — R710 Precipitous drop in hematocrit: Secondary | ICD-10-CM

## 2016-08-07 DIAGNOSIS — K7031 Alcoholic cirrhosis of liver with ascites: Secondary | ICD-10-CM

## 2016-08-07 DIAGNOSIS — R195 Other fecal abnormalities: Secondary | ICD-10-CM

## 2016-08-07 DIAGNOSIS — K7200 Acute and subacute hepatic failure without coma: Secondary | ICD-10-CM

## 2016-08-07 LAB — CBC
HCT: 27.5 % — ABNORMAL LOW (ref 39.0–52.0)
HEMATOCRIT: 27.1 % — AB (ref 39.0–52.0)
HEMATOCRIT: 26.3 % — AB (ref 39.0–52.0)
HEMOGLOBIN: 10.1 g/dL — AB (ref 13.0–17.0)
HEMOGLOBIN: 9.9 g/dL — AB (ref 13.0–17.0)
Hemoglobin: 9.6 g/dL — ABNORMAL LOW (ref 13.0–17.0)
MCH: 35.9 pg — ABNORMAL HIGH (ref 26.0–34.0)
MCH: 34.7 pg — ABNORMAL HIGH (ref 26.0–34.0)
MCH: 36.3 pg — ABNORMAL HIGH (ref 26.0–34.0)
MCHC: 36.7 g/dL — AB (ref 30.0–36.0)
MCHC: 35.4 g/dL (ref 30.0–36.0)
MCHC: 37.6 g/dL — AB (ref 30.0–36.0)
MCV: 97.9 fL (ref 78.0–100.0)
MCV: 97.8 fL (ref 78.0–100.0)
MCV: 96.3 fL (ref 78.0–100.0)
PLATELETS: 53 10*3/uL — AB (ref 150–400)
Platelets: 54 10*3/uL — ABNORMAL LOW (ref 150–400)
Platelets: 89 10*3/uL — ABNORMAL LOW (ref 150–400)
RBC: 2.81 MIL/uL — AB (ref 4.22–5.81)
RBC: 2.77 MIL/uL — AB (ref 4.22–5.81)
RBC: 2.73 MIL/uL — ABNORMAL LOW (ref 4.22–5.81)
RDW: 22.8 % — ABNORMAL HIGH (ref 11.5–15.5)
RDW: 23.1 % — ABNORMAL HIGH (ref 11.5–15.5)
RDW: 23.2 % — ABNORMAL HIGH (ref 11.5–15.5)
WBC: 3.6 10*3/uL — ABNORMAL LOW (ref 4.0–10.5)
WBC: 5.3 10*3/uL (ref 4.0–10.5)
WBC: 6.6 10*3/uL (ref 4.0–10.5)

## 2016-08-07 LAB — SODIUM, URINE, RANDOM

## 2016-08-07 LAB — TYPE AND SCREEN
ABO/RH(D): O POS
Antibody Screen: NEGATIVE
Unit division: 0

## 2016-08-07 LAB — COMPREHENSIVE METABOLIC PANEL
ALBUMIN: 3.3 g/dL — AB (ref 3.5–5.0)
ALK PHOS: 122 U/L (ref 38–126)
ALT: 86 U/L — ABNORMAL HIGH (ref 17–63)
ANION GAP: 9 (ref 5–15)
AST: 217 U/L — ABNORMAL HIGH (ref 15–41)
BILIRUBIN TOTAL: 2.5 mg/dL — AB (ref 0.3–1.2)
BUN: 36 mg/dL — AB (ref 6–20)
CALCIUM: 8.9 mg/dL (ref 8.9–10.3)
CO2: 25 mmol/L (ref 22–32)
CREATININE: 1.82 mg/dL — AB (ref 0.61–1.24)
Chloride: 98 mmol/L — ABNORMAL LOW (ref 101–111)
GFR calc Af Amer: 43 mL/min — ABNORMAL LOW (ref >60)
GFR calc non Af Amer: 37 mL/min — ABNORMAL LOW (ref >60)
GLUCOSE: 322 mg/dL — AB (ref 65–99)
Potassium: 4.3 mmol/L (ref 3.5–5.1)
Sodium: 132 mmol/L — ABNORMAL LOW (ref 135–145)
Total Protein: 6.5 g/dL (ref 6.5–8.1)

## 2016-08-07 LAB — CREATININE, URINE, RANDOM: CREATININE, URINE: 136.83 mg/dL

## 2016-08-07 MED ORDER — PHYTONADIONE 5 MG PO TABS
10.0000 mg | ORAL_TABLET | Freq: Every day | ORAL | Status: AC
Start: 2016-08-07 — End: 2016-08-09
  Administered 2016-08-07 – 2016-08-09 (×3): 10 mg via ORAL
  Filled 2016-08-07 (×3): qty 2

## 2016-08-07 MED ORDER — SENNA 8.6 MG PO TABS
1.0000 | ORAL_TABLET | Freq: Once | ORAL | Status: AC
  Administered 2016-08-07: 8.6 mg via ORAL
  Filled 2016-08-07: qty 1

## 2016-08-07 MED ORDER — SENNA 8.6 MG PO TABS
1.0000 | ORAL_TABLET | Freq: Two times a day (BID) | ORAL | Status: DC | PRN
  Administered 2016-08-08: 8.6 mg via ORAL
  Filled 2016-08-07: qty 1

## 2016-08-07 MED ORDER — FENTANYL CITRATE (PF) 100 MCG/2ML IJ SOLN
25.0000 ug | INTRAMUSCULAR | Status: DC | PRN
  Administered 2016-08-07 – 2016-08-12 (×9): 25 ug via INTRAVENOUS
  Filled 2016-08-07 (×9): qty 2

## 2016-08-07 NOTE — Progress Notes (Signed)
SLP Cancellation Note  Patient Details Name: Patrick [name] MRN: [id number] DOB: 11-26-1950   Cancelled treatment:         Pt npo for EGD.  Note palliative following pt.    Luanna Salk, [facility] SLP [phone]

## 2016-08-07 NOTE — Progress Notes (Signed)
   08/07/16 0900  Clinical Encounter Type  Visited With Patient  Visit Type Initial;Psychological support;Spiritual support;Critical Care  Referral From Physician  Consult/Referral To Chaplain  Spiritual Encounters  Spiritual Needs Other (Comment);Brochure Engineer, mining)  Stress Factors  Patient Stress Factors Other (Comment) Chief Operating Officer)  Regulatory affairs officer (For Healthcare)  Does Patient Have a Medical Advance Directive? No  Would patient like information on creating a medical advance directive? Yes (Inpatient - patient requests chaplain consult to create a medical advance directive)   I visited with the patient per Spiritual Care consult by the physician for an Advance Directive.  When I arrived to the room the patient was lying down and was not very talkative. Patrick [name] stated that he was interested in an Advance Directive, but that he wanted to wait for his sister to get to the hospital to discuss the document with her.  I left the paperwork for the patient to go over and told him to have Spiritual Care paged when he is ready to discuss the document.   Please, contact Spiritual Care for further assistance.   [place] M.Div.

## 2016-08-07 NOTE — Progress Notes (Signed)
   08/07/16 1100  Clinical Encounter Type  Visited With Patient and family together  Visit Type Follow-up;Psychological support;Spiritual support;Critical Care  Referral From Physician  Consult/Referral To Chaplain  Spiritual Encounters  Spiritual Needs Other (Comment);Emotional (Advance Directive Completion )  Stress Factors  Patient Stress Factors Other (Comment) (Advance Directive Completion )  Family Stress Factors Major life changes;Health changes;Other (Comment)  Advance Directives (For Healthcare)  Does Patient Have a Medical Advance Directive? Yes  Does patient want to make changes to medical advance directive? Yes (Inpatient - patient requests chaplain consult to change a medical advance directive)  Type of Advance Directive [place] in Chart? Yes  Would patient like information on creating a medical advance directive? (Been Completed and in paper chart )   I followed up with the patient and his family per referral by the physician. The patient wanted to complete the Advance Directive- [place].  The document was reviewed with the patient, he understood the document and wanted to name his sister as his Healthcare power of Attorney. His mother was named as secondary.  A notary was obtained and two witnesses. The document was completed. Copies were made for the patient and a paper copy was placed in his paper chart.  Spiritual Care, compassionate listening and support was also given to the patient who is having a difficult time with his recent medical prognosis.  Please, contact Spiritual Care for further assistance.   [place] M.Div.

## 2016-08-07 NOTE — Progress Notes (Signed)
PROGRESS NOTE  Patrick [name]  [id number] DOB: [birth year] DOA: 07/31/2016 PCP: Javier Docker, MD  Brief Narrative: 66 y.o.malewith medical history significant of CAD s/p PCI, Hep C, alcoholic cirrhosis, polysubstance abuse, and esophageal CA s/p chemo and XRT  in remission presenting with increasing abdominal distention. He endorsed decreased po intake, had been drinking alcohol, on average about a fifth per day. 3L of transudative ascitic fluid was removed with subsequently negative cultures. Acute kidney injury was noted on admission. Urinalysis unremarkable, CT abdomen showed atrophic right kidney and nonobstructing left renal stone. After marginal improvement in creatinine (1.93 > 1.40), renal function worsened with concern for hepatorenal syndrome and albumin was given on 01/03. On admission he had heme positive stool and has been given vitamin K for elevated INR on admission, most recently down to 1.9. On 01/03 he became hypotensive with a drop in hemoglobin (12.3 >> 7.7) and was transfused 1u PRBCs. Previous EGD in March 2017 by Dr. Carlean Purl showed no varices and negative biopsy. GI is asked to consult.   Assessment & Plan: Acute liver failure with ascites due to chronic alcoholic cirrhosis: Child-Pugh score 12 (Class C), INR 2.10 at admission. 3L transudative paracentesis 12/29 without evidence of SBP. - Hold lasix/spironolactone due to hypotension. - Ammonia mildly elevated at 41, so rifaximin was started 1/1 - Monitor resultant thrombocytopenia and coagulopathy - Palliative care consulted, family meeting this morning. - Abdominal distention worsening, will need repeat therapeutic paracentesis soon. ?TIPS candidate  Upper GI bleed with acute on chronic anemia: Heme positive in ED without gross GI bleeding since that time. Hemoglobin dropped to 7.7 on 01/03 prompting 1u PRBCs and platelet transfusion. Patient has coagulopathy with elevated INR and thrombocytopenia. Currently  hemodynamically stable.  - Will consult GI, last endoscopy by Dr. Carlean Purl. Made NPO. - Given vitamin K 10mg  x2. Latest INR 1.9. Continue vitamin K daily. - Continue SDU pending outcome of family meeting.  Acute renal failure likely in the setting of ascites and liver disease versus  progressive chronic kidney disease. UA unremarkable. CT scan of abdomen showed atrophic right kidney and nonobstructive 3 mm stone in the left kidney with no left hydroureter. - Continue to monitor BMP.  - Avoid nephrotoxins. - Refractory renal failure with hypotension: gave albumin 1.5g/kg 01/03  Thrombocytopenia: Chronic, stable. Received transfusion 01/04. - Monitor CBC - Holding ASA  Prolonged QT - Will need to judiciously use medications which may increase the QT interval - Continue on tele  Chronic ETOH dependence: without evidence of withdrawal. Reports last drink was before Christmas. - Refer to outpatient rehabilitation. - Will continue vitamins and supportive care.   Severe malnutrition: s/p PEG tube due to dysphagia during treatment for esophageal CA. Not taking good po - Nutrition consulted.  - Continue dysphagia diet per SLP - Consider NGT/PEG depending on outcome of palliative care meeting this morning  Tobacco dependence - Encourage cessation.  - Nicotine patch ordered  Physical deconditioning:  - PT recommended SNF but patient declines - hoping family will be able to help with this. They are coming in from out of town today - Given multiple comorbidities, consulted for palliative care service.  DVT prophylaxis: SCD. No anticoagulation because of coagulopathy and thrombocytopenia Code Status: PARTIAL: No CPR or intubation. ACLS meds ok. Family Communication: Family meeting scheduled this morning at 10:00am  Disposition Plan: Continue SDU, GI consult pending.  Consultants:   Palliative care  CCM  GI  Procedures: U/S-guided paracentesis Antimicrobials: None  Subjective: Pt  feels  comfortable this morning. Abdominal discomfort stable. Neuropathy pain improved. No dyspnea, N/V/D.  Objective: Vitals:   08/07/16 0400 08/07/16 0500 08/07/16 0600 08/07/16 0725  BP: 130/84 138/79 99/65   Pulse: 83 92 88   Resp: 18 (!) 22    Temp:    97.5 F (36.4 C)  TempSrc:    Oral  SpO2: 94% 91% (!) 89%   Weight:      Height:        Intake/Output Summary (Last 24 hours) at 08/07/16 0743 Last data filed at 08/07/16 0600  Gross per 24 hour  Intake          1361.67 ml  Output              685 ml  Net           676.67 ml   Filed Weights   08/06/16 0602 08/06/16 1732 08/07/16 0147  Weight: 80.9 kg (178 lb 5.6 oz) 84.9 kg (187 lb 2.7 oz) 81.6 kg (179 lb 14.3 oz)    Examination: General exam: Ill-looking male lying on bed comfortable.  Respiratory system: Clear to auscultation. Respiratory effort normal. No wheezing or crackle Cardiovascular system: S1 & S2 heard, RRR.  No pedal edema. Gastrointestinal system: Abdomen is more distended but remains nontender. Bowel sound positive Central nervous system: Alert, awake and following commands. Diffusely weak without focal sensorimotor deficits. Upper extremities with mild tremor. Extremities: No deformities Skin: No rashes, lesions or ulcers Psychiatry: Judgement and insight appear normal. Depressed mood with flattened affect.  Data Reviewed: I have personally reviewed following labs and imaging studies  CBC:  Recent Labs Lab 08/03/16 0508 08/04/16 0451 08/06/16 0525 08/06/16 1524 08/07/16 0506  WBC 5.2 3.7* 3.4* 3.0* 3.6*  NEUTROABS  --   --  1.8  --   --   HGB 10.0* 12.5* 9.3* 7.7* 10.1*  HCT 26.6* 34.5* 25.0* 20.8* 27.5*  MCV 100.0 100.3* 100.4* 100.0 97.9  PLT 41* 30* 43* 39* 54*   Basic Metabolic Panel:  Recent Labs Lab 08/03/16 0508 08/04/16 0451 08/05/16 0318 08/06/16 0525 08/06/16 1524  NA 136 135 135 132* 134*  K 3.8 3.6 3.4* 3.7 3.9  CL 101 101 100* 100* 98*  CO2 26 27 28 26 28   GLUCOSE  123* 135* 150* 158* 163*  BUN 40* 43* 38* 39* 37*  CREATININE 1.67* 1.78* 1.40* 1.96* 1.92*  CALCIUM 8.8* 8.7* 8.3* 8.3* 8.9   GFR: Estimated Creatinine Clearance: 38.4 mL/min (by C-G formula based on SCr of 1.92 mg/dL (H)). Liver Function Tests:  Recent Labs Lab 07/31/16 1451 08/02/16 0706 08/03/16 0508 08/06/16 0525  AST 114* 120* 148* 253*  ALT 46 45 56 93*  ALKPHOS 85 94 110 132*  BILITOT 5.4* 3.0* 3.4* 2.0*  PROT 6.4* 5.9* 7.2 5.9*  ALBUMIN 1.9* 1.9* 2.1* 1.8*    Recent Labs Lab 07/31/16 1451  LIPASE 30    Recent Labs Lab 08/02/16 1005  AMMONIA 41*   Coagulation Profile:  Recent Labs Lab 07/31/16 1451 08/01/16 0347 08/05/16 0318 08/06/16 0525  INR 2.10 2.19 2.03 1.91   Cardiac Enzymes: No results for input(s): CKTOTAL, CKMB, CKMBINDEX, TROPONINI in the last 168 hours. BNP (last 3 results) No results for input(s): PROBNP in the last 8760 hours. HbA1C: No results for input(s): HGBA1C in the last 72 hours. CBG:  Recent Labs Lab 08/04/16 0740 08/04/16 1217  GLUCAP 132* 162*   Lipid Profile: No results for input(s): CHOL, HDL, LDLCALC, TRIG, CHOLHDL, LDLDIRECT in  the last 72 hours. Thyroid Function Tests: No results for input(s): TSH, T4TOTAL, FREET4, T3FREE, THYROIDAB in the last 72 hours. Anemia Panel: No results for input(s): VITAMINB12, FOLATE, FERRITIN, TIBC, IRON, RETICCTPCT in the last 72 hours. Sepsis Labs: No results for input(s): PROCALCITON, LATICACIDVEN in the last 168 hours.  Recent Results (from the past 240 hour(s))  Culture, body fluid-bottle     Status: None   Collection Time: 08/01/16 10:38 AM  Result Value Ref Range Status   Specimen Description FLUID PERITONEAL  Final   Special Requests NONE  Final   Culture   Final    NO GROWTH 5 DAYS Performed at [facility]    Report Status 08/06/2016 FINAL  Final  Gram stain     Status: None   Collection Time: 08/01/16 10:38 AM  Result Value Ref Range Status   Specimen  Description FLUID PERITONEAL  Final   Special Requests NONE  Final   Gram Stain   Final    RARE WBC PRESENT,BOTH PMN AND MONONUCLEAR NO ORGANISMS SEEN Performed at [facility]    Report Status 08/01/2016 FINAL  Final  MRSA PCR Screening     Status: None   Collection Time: 08/06/16  5:00 PM  Result Value Ref Range Status   MRSA by PCR NEGATIVE NEGATIVE Final    Comment:        The GeneXpert MRSA Assay (FDA approved for NASAL specimens only), is one component of a comprehensive MRSA colonization surveillance program. It is not intended to diagnose MRSA infection nor to guide or monitor treatment for MRSA infections.          Radiology Studies: Dg Chest Port 1 View  Result Date: 08/06/2016 CLINICAL DATA:  Initial evaluation for acute hypoxemia. EXAM: PORTABLE CHEST 1 VIEW COMPARISON:  Prior radiograph from 06/26/2015. FINDINGS: Examination is somewhat technically limited by patient positioning as the patient is markedly rotated to the right. Cardiomegaly is stable. Allowing for patient rotation, mediastinal silhouette within normal limits. Aortic atherosclerosis noted. Lungs are hypoinflated. Associated mild bibasilar atelectasis/ bronchovascular crowding. No focal infiltrates. No pulmonary edema or pleural effusion. No pneumothorax. No acute osseous abnormality. Remotely healed right-sided rib fractures noted. Prominent vascular calcifications noted within the axillae bilaterally. IMPRESSION: 1. Shallow lung inflation with mild bibasilar opacities. Atelectasis/bronchovascular crowding is favored. 2. No other active cardiopulmonary disease identified. 3. Stable mild cardiomegaly without pulmonary edema. 4. Prominent atherosclerosis within the aorta and subclavian/axillary arteries. Electronically Signed   By: Jeannine Boga M.D.   On: 08/06/2016 19:35        Scheduled Meds: . atorvastatin  40 mg Per Tube q1800  . chlorhexidine  15 mL Mouth Rinse BID  . feeding  supplement (ENSURE ENLIVE)  237 mL Oral 5 X Daily  . folic acid  1 mg Oral Daily  . mouth rinse  15 mL Mouth Rinse q12n4p  . methylPREDNISolone (SOLU-MEDROL) injection  125 mg Intravenous Q12H  . metoprolol tartrate  12.5 mg Oral BID  . multivitamin with minerals  1 tablet Oral Daily  . nicotine  21 mg Transdermal Daily  . pneumococcal 23 valent vaccine  0.5 mL Intramuscular Tomorrow-1000  . rifaximin  400 mg Oral Q8H  . sodium chloride flush  3 mL Intravenous Q12H  . thiamine  100 mg Oral Daily   Or  . thiamine  100 mg Intravenous Daily   Continuous Infusions:   LOS: 7 days   Vance Gather, MD [facility] Pager [phone]   If  7PM-7AM, please contact night-coverage [email or web address] Password [facility] 08/07/2016, 7:43 AM

## 2016-08-07 NOTE — Progress Notes (Signed)
PULMONARY / CRITICAL CARE MEDICINE   Name: Patrick [name] MRN: [id number] DOB: January 04, 1951    ADMISSION DATE:  07/31/2016 CONSULTATION DATE:  08/07/15  REFERRING MD:  Dr. Bonner Puna   CHIEF COMPLAINT:  ABD distention   HISTORY OF PRESENT ILLNESS:   66 year old male with PMH of CAD s/p PCI, Hep C, alcoholic cirrhosis (last drink before Christmas, generally drinks 1/5 per day), polysubstance abuse, thrombocytopenia, and esophageal CA, s/p PEG tube placement (removed November). Presented 12/28 with one month of progressive abdominal distention, worsening LFTs, and heme positive stools. Abdominal distension due to ascites and he successfully had 3L drained via paracentesis 12/29. Hospital course since that time has been complicated by ongoing ascites, confusion, and worsening blood counts. HBG, and platelets have been steadily declining. He has been seen by palliative and has family discussion planned for 1/4 10am. 1/3 he acutely decompensated with hypotension and was transferred to SDU. He was given 120g of albumin in attempts at resuscitation, however, he remained hypotensive. PCCM to see.    SUBJECTIVE:  HD stable. NAD.  VITAL SIGNS: BP 99/65   Pulse 88   Temp 97.5 F (36.4 C) (Oral)   Resp (!) 22   Ht 5\' 9"  (1.753 m)   Wt 179 lb 14.3 oz (81.6 kg)   SpO2 (!) 89%   BMI 26.57 kg/m   HEMODYNAMICS:    VENTILATOR SETTINGS:    INTAKE / OUTPUT:  Intake/Output Summary (Last 24 hours) at 08/07/16 0819 Last data filed at 08/07/16 0600  Gross per 24 hour  Intake          1361.67 ml  Output              685 ml  Net           676.67 ml   PHYSICAL EXAMINATION: General:  Male in no acute distress Neuro: Alert but wwith dull affect, oriented, some confusion HEENT:  [place]/AT, PERRL, no JVD Cardiovascular: RRR, no MRG Lungs: Clear Abdomen:  Distended, mild tender. C/w ascites Musculoskeletal:  No acute deformity or edema Skin:  Grossly intact  LABS:  BMET  Recent Labs Lab 08/05/16 0318  08/06/16 0525 08/06/16 1524  NA 135 132* 134*  K 3.4* 3.7 3.9  CL 100* 100* 98*  CO2 28 26 28   BUN 38* 39* 37*  CREATININE 1.40* 1.96* 1.92*  GLUCOSE 150* 158* 163*    Electrolytes  Recent Labs Lab 08/05/16 0318 08/06/16 0525 08/06/16 1524  CALCIUM 8.3* 8.3* 8.9    CBC  Recent Labs Lab 08/06/16 0525 08/06/16 1524 08/07/16 0506  WBC 3.4* 3.0* 3.6*  HGB 9.3* 7.7* 10.1*  HCT 25.0* 20.8* 27.5*  PLT 43* 39* 54*    Coag's  Recent Labs Lab 08/01/16 0347 08/05/16 0318 08/06/16 0525  INR 2.19 2.03 1.91    Sepsis Markers No results for input(s): LATICACIDVEN, PROCALCITON, O2SATVEN in the last 168 hours.  ABG No results for input(s): PHART, PCO2ART, PO2ART in the last 168 hours.  Liver Enzymes  Recent Labs Lab 08/02/16 0706 08/03/16 0508 08/06/16 0525  AST 120* 148* 253*  ALT 45 56 93*  ALKPHOS 94 110 132*  BILITOT 3.0* 3.4* 2.0*  ALBUMIN 1.9* 2.1* 1.8*    Cardiac Enzymes No results for input(s): TROPONINI, PROBNP in the last 168 hours.  Glucose  Recent Labs Lab 08/04/16 0740 08/04/16 1217  GLUCAP 132* 162*    Imaging Dg Chest Port 1 View  Result Date: 08/06/2016 CLINICAL DATA:  Initial evaluation for acute  hypoxemia. EXAM: PORTABLE CHEST 1 VIEW COMPARISON:  Prior radiograph from 06/26/2015. FINDINGS: Examination is somewhat technically limited by patient positioning as the patient is markedly rotated to the right. Cardiomegaly is stable. Allowing for patient rotation, mediastinal silhouette within normal limits. Aortic atherosclerosis noted. Lungs are hypoinflated. Associated mild bibasilar atelectasis/ bronchovascular crowding. No focal infiltrates. No pulmonary edema or pleural effusion. No pneumothorax. No acute osseous abnormality. Remotely healed right-sided rib fractures noted. Prominent vascular calcifications noted within the axillae bilaterally. IMPRESSION: 1. Shallow lung inflation with mild bibasilar opacities.  Atelectasis/bronchovascular crowding is favored. 2. No other active cardiopulmonary disease identified. 3. Stable mild cardiomegaly without pulmonary edema. 4. Prominent atherosclerosis within the aorta and subclavian/axillary arteries. Electronically Signed   By: Jeannine Boga M.D.   On: 08/06/2016 19:35     STUDIES:  [place] ABD 12/28 > cirrhosis, no flow demonstrated in portal vein by doppler  CT ABD/Pelvis > ascites, Markedly heterogeneous liver with nodular contour and asymmetric enlargement lateral segment left lobe, Stable appearance atrophic right kidney with several calcified parenchymal lesions  CULTURES: Peritoneal Fluid 12/29 > WBC present, no organisms seen   ANTIBIOTICS: Rifaximin 12/30 >   SIGNIFICANT EVENTS: 12/29 > Paracentesis > Removal 3L  LINES/TUBES:   DISCUSSION: 65 year old male with cirrhosis secondary to ETOH abuse and hepatitis C. Admitted for abd distension and underwent para 12/29 with 3L drained. 1/3 became hypotensive despite albumin administration. HGB and platelets dropping slowly. INR elevated due to hepatic disease.  ASSESSMENT / PLAN:  Hypotension - improved. Resolved 1/4 Etiology not entirely clear, however, based on declining blood counts could have slow bleed either GI in origin, or related to paracentesis. GI bleeding noted in ED, no obvious bleeding since then. BP Recovered somewhat with administration of albumin. Doubt septic.   Anemia   Recent Labs  08/06/16 1524 08/07/16 0506  HGB 7.7* 10.1*  Plts 39->54 post plt tx on 1/3   - acute on chronic with suspected ABLA  - Transfuse one unit PRBC and 1 unit platelets 1/3 - INR 1.9, little benefit from FFP, continue vitamin K.  - Repeat CBC after transfusions - Consider GI consultation  Hepatic failure secondary to alcoholic cirrhosis, hepatitis C Ascites - management per primary - holding diuresis due to hypotension - Palliative care meeting 1/4. He is LCB with no cpr/intubation -  TIPS candidate?  AKI Lab Results  Component Value Date   CREATININE 1.92 (H) 08/06/2016   CREATININE 1.96 (H) 08/06/2016   CREATININE 1.40 (H) 08/05/2016   CREATININE 0.8 09/07/2015   CREATININE 0.8 08/07/2015   CREATININE 0.8 07/24/2015     - likely secondary to hepatic failure - per primary   Mother and sister updated at bedside. 1/3, no family at bedside 1/4. Palliative meet for 1/4 1300 planned.   Little for PCCM to offer we will sign off. Please call us back if needed.  Richardson Landry Brittany Amirault ACNP Maryanna Shape PCCM Pager [phone] till 3 pm If no answer page [phone] 08/07/2016, 8:13 AM

## 2016-08-07 NOTE — Progress Notes (Signed)
Nutrition Follow-up  DOCUMENTATION CODES:   Severe malnutrition in context of chronic illness  INTERVENTION:  - Continue Ensure Enlive x5/day and Magic Cup TID. - RD will follow-up 1/8.  NUTRITION DIAGNOSIS:   Malnutrition related to chronic illness as evidenced by severe depletion of body fat, severe depletion of muscle mass. -ongoing  GOAL:   Patient will meet greater than or equal to 90% of their needs -unmet at this time.   MONITOR:   PO intake, Supplement acceptance, Weight trends, Labs, I & O's, Other (Comment) (GOC)  ASSESSMENT:   66 y.o. male with medical history significant of CAD s/p PCI, Hep C, alcoholic cirrhosis, polysubstance abuse, thrombocytopenia, and esophageal CA s/p PEG tube placement and subsequent removal presenting with "stomach swelling up".  Has been happening for over a month now.  +n/v.  No hematemesis.  Decreased PO intake.  Had a feeding tube (for h/o esophageal cancer) but it was removed over a month ago.  Diarrhea stools for days.  2-4 stools/day.  +subjective fever.   1/4 No intakes documented since yesterday. Pt was NPO from 0800 this AM until present and it appears that he is to remain NPO until sometime tomorrow. Spoke with Dr. Domingo Cocking following his discussion with pt's sister this AM. No plan for discussion of nutrition support or replacement of PEG at this time. Dr. Domingo Cocking shares that pt was consuming a fifth of liquor PTA. RD will follow-up Monday to continue to monitor POC/GOC and associated nutrition-related needs. Weight stable from yesterday but +2.5 kg from 08/04/16.  Medications reviewed; 1 mg oral folic acid/day, 540 mg IV Solu-medrol BID, daily multivitamin with minerals, PRN Zofran, 10 mg oral vitamin K/day, 100 mg oral thiamine/day.  Labs reviewed; AST/Alk Phos elevated, BUN: 37 mg/dL, creatinine: 1.92 mg/dL, Cl: 98 mmol/L, Na: 134 mmol/L, GFR: 41 mL/min.     1/3 - Patient s/p US paracentesis for ascites yielding 3 L yellow-colored  fluid on 12/29. - Per SLP eval 1/1 recommended patient be on dysphagia 1 diet with thin liquids. - Palliative following patient. He remains full code/full scope treatment.  - Noted in chart that PEG tube was placed 06/26/2015 and removed on 10/30/2015 due to tolerance of diet, no evidence of disease on endoscopy/biopsies.  - It appears patient had requested it be removed.  - Spoke with patient at bedside. - His appetite remains poor.  -He is experiencing nausea and abdominal pain.  - He reports he is only having bites of meals but does like the Ensure.  - He has tried [employer] but is not eating them regularly. - Weight trend: patient currently 80.9 kg, which is +2 kg from admission (likely in setting of fluid/ascites)  Meal Completion: 0-25% In the past 24 hours patient has had 770 kcal (39% minimum estimated kcal needs) and 43 grams of protein (46% minimum estimated protein needs).     Diet Order:  Diet NPO time specified  Skin:  Reviewed, no issues  Last BM:  12/29  Height:   Ht Readings from Last 1 Encounters:  08/06/16 '5\' 9"'$  (1.753 m)    Weight:   Wt Readings from Last 1 Encounters:  08/07/16 179 lb 14.3 oz (81.6 kg)    Ideal Body Weight:  67.2 kg  BMI:  Body mass index is 26.57 kg/m.  Estimated Nutritional Needs:   Kcal:  2000-2300kcal/day   Protein:  94-108g/day   Fluid:  2L/day   EDUCATION NEEDS:   No education needs identified at this time  Patrick Matin, MS, RD, LDN, [facility] In[facility] Clinical Dietitian Pager # [phone] After hours/weekend pager # [phone]

## 2016-08-07 NOTE — Progress Notes (Signed)
SlP spoke with pt regarding his dysphagia.  He was alert, complained of chills but able to answer questions.  Pt admits to ongoing dysphagia and requested to continue puree/thin diet when discussed.  Note pt to transfer to ICU per RN.  Luanna Salk, [place] [facility] SLP [phone]

## 2016-08-07 NOTE — Progress Notes (Addendum)
Daily Progress Note   Patient Name: Patrick [name]       Date: 08/07/2016 DOB: 1950-11-20  Age: 66 y.o. MRN#: [id number] Attending Physician: Patrecia Pour, MD Primary Care Physician: Javier Docker, MD Admit Date: 07/31/2016  Reason for Consultation/Follow-up: Establishing goals of care  Subjective: I met with Patrick [name], his mother, and his sister.  We reviewed his clinical course.  We discussed plan to continue current therapies without escalation to pressor support for another 24-48 hours.  We also discussed his wishes for CODE STATUS.  See below.  Length of Stay: 7  Current Medications: Scheduled Meds:  . atorvastatin  40 mg Per Tube q1800  . chlorhexidine  15 mL Mouth Rinse BID  . feeding supplement (ENSURE ENLIVE)  237 mL Oral 5 X Daily  . folic acid  1 mg Oral Daily  . mouth rinse  15 mL Mouth Rinse q12n4p  . methylPREDNISolone (SOLU-MEDROL) injection  125 mg Intravenous Q12H  . metoprolol tartrate  12.5 mg Oral BID  . multivitamin with minerals  1 tablet Oral Daily  . nicotine  21 mg Transdermal Daily  . phytonadione  10 mg Oral Daily  . pneumococcal 23 valent vaccine  0.5 mL Intramuscular Tomorrow-1000  . rifaximin  400 mg Oral Q8H  . sodium chloride flush  3 mL Intravenous Q12H  . thiamine  100 mg Oral Daily   Or  . thiamine  100 mg Intravenous Daily    Continuous Infusions:   PRN Meds: acetaminophen **OR** acetaminophen, fentaNYL (SUBLIMAZE) injection, ondansetron **OR** ondansetron (ZOFRAN) IV, oxyCODONE  Physical Exam   General: Ill-appearing male, slow to answer questions, but answers appropriately, no acute distress.  HEENT: No bruits, no goiter, no JVD Heart: Regular rate and rhythm. No murmur appreciated. Lungs: Good air movement, clear Abdomen: Soft,  nontender, distended, positive bowel sounds.  Ext: + LE edema Skin: Warm and dry        Vital Signs: BP 106/67   Pulse (!) 107   Temp 98.2 F (36.8 C) (Oral)   Resp (!) 22   Ht _0  (1.753 m)   Wt 81.6 kg (179 lb 14.3 oz)   SpO2 (!) 87%   BMI 26.57 kg/m  SpO2: SpO2: (!) 87 % O2 Device: O2 Device: Not Delivered (pt refused o2) O2 Flow Rate:  O2 Flow Rate (L/min): 3 L/min  Intake/output summary:   Intake/Output Summary (Last 24 hours) at 08/07/16 1511 Last data filed at 08/07/16 1500  Gross per 24 hour  Intake          1541.67 ml  Output              585 ml  Net           956.67 ml   LBM: Last BM Date: 07/31/16 Baseline Weight: Weight: 78.9 kg (173 lb 15.1 oz) Most recent weight: Weight: 81.6 kg (179 lb 14.3 oz)       Palliative Assessment/Data:    Flowsheet Rows   Flowsheet Row Most Recent Value  Intake Tab  Referral Department  Hospitalist  Unit at Time of Referral  Med/Surg Unit  Palliative Care Primary Diagnosis  Other (Comment) [Hepatic]  Date Notified  08/05/16  Palliative Care Type  New Palliative care  Reason for referral  Clarify Goals of Care  Date of Admission  07/31/16  Date first seen by Palliative Care  08/05/16  # of days Palliative referral response time  0 Day(s)  # of days IP prior to Palliative referral  5  Clinical Assessment  Palliative Performance Scale Score  50%  Pain Max last 24 hours  7  Pain Min Last 24 hours  0  Psychosocial & Spiritual Assessment  Palliative Care Outcomes  Patient/Family meeting held?  Yes  Who was at the meeting?  Patient  Palliative Care Outcomes  Provided advance care planning, ACP counseling assistance      Patient Active Problem List   Diagnosis Date Noted  . Heme + stool   . Decreased hemoglobin   . Palliative care encounter   . Goals of care, counseling/discussion   . Acute liver failure 08/01/2016  . Ascites 08/01/2016  . Acute renal failure ([place]) 08/01/2016  . Melena 08/01/2016  .  Protein-calorie malnutrition, severe ([place]) 08/01/2016  . Alcohol withdrawal ([place]) 08/01/2016  . 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 Chemotherapy-induced neuropathy ([place]) 10/19/2015  . Constipation 08/03/2015  . Cancer associated pain 08/03/2015  . Nausea with vomiting 08/03/2015  . Acute confusion   . Esophageal dysphagia 06/26/2015  . Acute encephalopathy 06/26/2015  . 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 Alcohol-induced acute pancreatitis   . Abnormal CT scan, esophagus   . Abnormal weight loss   . Dysphagia, pharyngoesophageal phase   . Fall 04/26/2015  . Numbness 04/26/2015  . Protein-calorie malnutrition, moderate ([place]) 04/26/2015  . Weakness 04/26/2015  . Abdominal pain 04/26/2015  . Pancreatitis 04/26/2015  . Alcohol abuse   . Dysphagia   . Prolonged QT interval   . Essential hypertension   . Polysubstance abuse 09/03/2012  . Leukopenia and Thrombocytopenia 01/02/2012  . Chronic hepatitis C with cirrhosis ([place]) 01/02/2012  . Alcoholism ([place]) 01/02/2012  . Barrett's esophagus 11/17/2011  . History of colonic polyps 11/11/2011  . Thrombocytopenia ([place]) 03/03/2011  . Chest pain 12/03/2010  . CAD (coronary artery disease) 12/03/2010  . Hyperlipidemia 12/03/2010  . Tobacco abuse 12/03/2010  . Cocaine abuse 12/03/2010    Palliative Care Assessment & Plan   Patient Profile: 66 y.o. male  with past medical history of CAD s/p PCI, Hep C, alcoholic cirrhosis, polysubstance abuse, thrombocytopenia, and esophageal CA s/p PEG tube placement and subsequent removal admitted on 07/31/2016 with  hepatic failure, renal failure.  Palliative consulted for goals of care for hepatic failure.  Recommendations/Plan:  I met today with patient and his sister.  He reports that he thinks he is dying.  His main goal is  to be comfortable, but he wants to continue with current therapies for the next day or two to see how he responds.  He does not want to continue to escalate care in the event of decline.  He is ok with having EGD if recommended and receiving blood products again as necessary.  He does not want central line and pressors in the event of continued hypotension.  He does not want Bipap.  Pain: plan for trial of fentanyl 33mg Q2H as needed.  He would like to receive medication for pain regardless of concern for hypotension or other side effects.  He feels strongly that being comfortable is top priority if he declines.  I discussed with Mr. Macke regarding heroic interventions at the end-of-life again today.  He does not want heroic measures at the end of life.  He reports today that he desires DNR CODE STATUS.  I made this change today.  Appreciate Chaplain working to establish his sister as his H7    Code Status  DNR  Prognosis:   Unable to determine  Discharge Planning:  To Be Determined  Care plan was discussed with patient, RN, Dr GBonner Puna Pt sister, ZLorn Junes Thank you for allowing the Palliative Medicine Team to assist in the care of this patient.   Time In: 0950 Time Out: 1100 Total Time 70 Prolonged Time Billed Yes      Greater than 50%  of this time was spent counseling and coordinating care related to the above assessment and plan.  GMicheline Rough MD  Please contact Palliative Medicine Team phone at [phone] questions and concerns.

## 2016-08-07 NOTE — Evaluation (Addendum)
SLP Cancellation Note  Patient Details Name: Patrick [name] MRN: [id number] DOB: Aug 08, 1950   Cancelled treatment:       Reason Eval/Treat Not Completed: Medical issues which prohibited therapy (pt npo).     Luanna Salk, [facility] SLP [phone]

## 2016-08-07 NOTE — Progress Notes (Signed)
[facility] PCCM  S: comfortable in bed  O: Vitals:   08/07/16 0500 08/07/16 0600 08/07/16 0725 08/07/16 0800  BP: 138/79 99/65  100/62  Pulse: 92 88  88  Resp: (!) 22   20  Temp:   97.5 F (36.4 C)   TempSrc:   Oral   SpO2: 91% (!) 89%  92%  Weight:      Height:       Gen: chronically ill appearing HENT: OP clear, neck supple PULM: CTA B, normal percussion CV: RRR, no mgr, trace edema GI: BS+, soft, nontender mild distension Derm: no cyanosis or rash Psyche: normal mood and affect  Chart reviewed: esophageal cancer, treated with XRT/chemo in 2016; admitted yesterday with hypotension post recent paracentesis but possible GI bleed  This am: no pressors, minimal oxygen support  Impression/Plan Cirrhosis Hypotension resolved GI bleed? AKI  As hemodynamically stable PCCM will sign off; agree with palliative approach  Roselie Awkward, MD [place] P[facility]M Pager: [phone] Cell: [phone] After 3pm or if no response, call [phone]

## 2016-08-07 NOTE — Consult Note (Signed)
Consultation   Impression / Plan:   Impression: 1. FOBT + stools: found 07/31/16 at time of admission 2. Anemia: hgb now 10.1, but this is after 1 u prbcs last night when hgb was 7.7, large drop from 12.5 on 1/1/8 to 9.3 on 08/06/16 morning; consider relation to upper GI bleed vs esophageal cancer vs other 3. Thrombocytopenia: consistent with liver disease, some improvement from 27 on 08/01/16 to 54 currently 4. Cirrhosis: thought related to patients alcohol abuse and hep C, current MELD score 23 (Na corrected = 30)  5. Substance abuse 6. Epigastric Pain:with increase in reflux; consider PUD vs gastritis vs other 7. Diarrhea: loose stools per pt, green in color for over 2 weeks now, though history if "hazy"-consider infectious cause vs other  Plan: 1. Continue to monitor hgb q6h with transfusion as necessary <7 2. Plan for EGd tomorrow with Dr. Carlean Purl. Discussed risks, benefits, Limitations and alternatives and the patient agrees to proceed. 3. Patient will be nothing by mouth after midnight. 4. Continue current supportive treatment 5. Please await any further recs from Dr. Carlean Purl later today  Thank you for your kind consultation, we will continue to follow.  Lavone Nian Lemmon  08/07/2016, 8:51 AM Pager #: [phone]   [facility] Attending   I have taken an interval history, reviewed the chart and examined the patient. I agree with the Advanced Practitioner's note, impression and recommendations.  He could be anemic from tumor or portal htn bleeding Repeat EGD - possible dilation reasonable Ascites - liver dz vs tumor or both - he will likely need another paracentesis  Gatha Mayer, MD, [facility] [phone] (pager) [phone] after 5 PM, weekends and holidays  08/07/2016 12:25 PM    Referring Provider:   Dr. Bonner Puna   Primary Care Physician:  Javier Docker, MD Primary Gastroenterologist:  Dr. Carlean Purl       Reason for Consultation:   Anemia, FOBT + stools, Ascites            HPI:   Patrick [name] is a 66 y.o. AA male with past med hx sig for hep C, alcoholic cirrhosis, Barrett's esophagus, CAD, cancer of the thoracic esophagus, GERD, colon adenomas, MI x3, HTN, HLD and thrombocytopenia who was initially admitted to the hospital on 07/31/16 with increasing abdominal distension due to ascites, worsening LFT's and heme positive stools. He underwent paracentesis with 3 L removed on 08/01/16-transudative fluid with neg cultures.  We were consulted today in regards to hypotension yesterday thought related to upper GI bleed with fecal occult blood positive results and decreasing hgb since admission.   It should be noted the patient is a poor historian. Today, the patient tells me that his "belly just kept getting bigger and bigger" before time of his admission. He notes that for a few days prior to his admission he was also having multiple episodes of vomiting and diarrhea. He tells me that this vomit looked "quite dark". Since time of his admission a week ago he has had no further episodes of vomiting but has continued with epigastric pain. He also tells me that he continues with loose stools noting that they're "green in color". He does tell me that that they have looked "black in the past". He is unable to tell me exactly when or how long ago his last one was. Associated symptoms include an increase in reflux since the patient's admission per his account. Overall the patient tells me that he does not  feel well.   Patient denies fever, chills, bright red blood in his stool, use of NSAIDs or lower abdominal discomfort.  Previous GI History: 10/30/15-Dr. Carlean Purl, EGD:  (most recent) - One severe (stenosis; an endoscope cannot pass) stenosis was found. And was traversed after dilation. A TTS dilator was passed through the scope. Dilation with a dilator was performed to 12 mm. Biopsies were taken with a cold forceps for histology. - There was  evidence of a gastrostomy present on the anterior wall of the stomach. - The exam was otherwise without abnormality. - The cardia and gastric fundus were normal on retroflexion. - gastrostomy tube was removed after procedure  Past Medical History:  Diagnosis Date  . Alcoholic peripheral neuropathy ([place])   . Alcoholism ([place]) 01/02/2012   cocaine abuse, herion abuse in past  . Allergy   . Anemia   . Arthritis    knees  . Barrett's esophagus 11/17/2011   Short-segment dx 11/2011   . CAD (coronary artery disease)   . Cancer of thoracic esophagus ([place])   . Cataract    bil removed  . Chronic chest pain   . Chronic hepatitis C with cirrhosis ([place]) 01/02/2012  . Colon adenomas 11/11/11   x 5, one rectal tv adenoma  . GERD (gastroesophageal reflux disease)   . HTN (hypertension)   . Hyperlipidemia   . MI (myocardial infarction)    x3, jan, aug, nov 2011; treated at [facility] in [place]  . 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 Sexual dysfunction   . SSBE (short-segment Barrett's esophagus) 11/11/11  . Substance abuse    cocaine abuse in past  . Thrombocytopenia ([place])     Past Surgical History:  Procedure Laterality Date  . BALLOON ANGIOPLASTY, ARTERY  03/2010  . CARDIAC CATHETERIZATION  01/31/2011  . CATARACT EXTRACTION W/PHACO  08/18/2012   Procedure: CATARACT EXTRACTION PHACO AND INTRAOCULAR LENS PLACEMENT (IOC);  Surgeon: Adonis Brook, MD;  Location: [place];  Service: Ophthalmology;  Laterality: Right;  . CATARACT EXTRACTION W/PHACO Left 09/22/2012   Procedure: CATARACT EXTRACTION PHACO AND INTRAOCULAR LENS PLACEMENT (IOC);  Surgeon: Adonis Brook, MD;  Location: [place];  Service: Ophthalmology;  Laterality: Left;  . COLONOSCOPY  11/11/11   Dr. Silvano Rusk  . CORONARY ANGIOPLASTY    . ESOPHAGOGASTRODUODENOSCOPY  11/11/11   Dr. Silvano Rusk  . ESOPHAGOGASTRODUODENOSCOPY (EGD) WITH PROPOFOL N/A 04/27/2015   Procedure: ESOPHAGOGASTRODUODENOSCOPY (EGD) WITH PROPOFOL;  Surgeon: Jerene Bears, MD;   Location: [facility] ENDOSCOPY;  Service: Endoscopy;  Laterality: N/A;  . HERNIA REPAIR     very young  . PEG PLACEMENT N/A 06/26/2015   Procedure: PERCUTANEOUS ENDOSCOPIC GASTROSTOMY (PEG) PLACEMENT;  Surgeon: Gatha Mayer, MD;  Location: [facility] ENDOSCOPY;  Service: Endoscopy;  Laterality: N/A;  . UPPER GASTROINTESTINAL ENDOSCOPY      Family History  Problem Relation Age of Onset  . Cancer      mother's side/fathers side, 2 uncles  . Heart attack Maternal Grandfather   . Diabetes Sister   . Stroke Sister   . Healthy Daughter   . Healthy Son   . Colon cancer Neg Hx   . Esophageal cancer Neg Hx   . Rectal cancer Neg Hx   . Stomach cancer Neg Hx     Social History  Substance Use Topics  . Smoking status: Former Smoker    Packs/day: 0.10    Years: 45.00    Types: Cigarettes    Quit date: 07/27/2016  . Smokeless tobacco: Never Used  Comment: 1-2 cig/day  . Alcohol use 0.0 oz/week     Comment: 1/5 daily, stopped 07/27/16    Prior to Admission medications   Medication Sig Start Date End Date Taking? Authorizing Provider  aspirin 81 MG tablet Place 1 tablet (81 mg total) into feeding tube daily. 06/29/15  Yes Hosie Poisson, MD  atorvastatin (LIPITOR) 40 MG tablet Place 1 tablet (40 mg total) into feeding tube daily at 6 PM. 06/29/15  Yes Hosie Poisson, MD  lisinopril (PRINIVIL,ZESTRIL) 20 MG tablet Place 1 tablet (20 mg total) into feeding tube daily. 06/29/15  Yes Hosie Poisson, MD  Oxycodone HCl 10 MG TABS Take 10 mg by mouth daily as needed (pain).  04/16/16  Yes Historical Provider, MD  AMITIZA 24 MCG capsule Take 24 mcg by mouth 2 (two) times daily. 05/12/16   Historical Provider, MD  carvedilol (COREG) 12.5 MG tablet Place 1 tablet (12.5 mg total) into feeding tube 2 (two) times daily with a meal. Patient not taking: Reported on 07/31/2016 06/29/15   Hosie Poisson, MD  Multiple Vitamin (MULTIVITAMIN WITH MINERALS) TABS tablet Place 1 tablet into feeding tube daily. 06/29/15   Hosie Poisson, MD  nitroGLYCERIN (NITROSTAT) 0.4 MG SL tablet Place 1 tablet (0.4 mg total) under the tongue every 5 (five) minutes as needed for chest pain. 08/06/15   Brett Canales, PA-C    Current Facility-Administered Medications  Medication Dose Route Frequency Provider Last Rate Last Dose  . acetaminophen (TYLENOL) tablet 650 mg  650 mg Oral Q6H PRN Karmen Bongo, MD   650 mg at 08/06/16 0413   Or  . acetaminophen (TYLENOL) suppository 650 mg  650 mg Rectal Q6H PRN Karmen Bongo, MD      . atorvastatin (LIPITOR) tablet 40 mg  40 mg Per Tube q1800 Karmen Bongo, MD   40 mg at 08/06/16 1703  . chlorhexidine (PERIDEX) 0.12 % solution 15 mL  15 mL Mouth Rinse BID Karmen Bongo, MD   15 mL at 08/06/16 2200  . feeding supplement (ENSURE ENLIVE) (ENSURE ENLIVE) liquid 237 mL  237 mL Oral 5 X Daily Patrecia Pour, MD   237 mL at 08/07/16 0600  . folic acid (FOLVITE) tablet 1 mg  1 mg Oral Daily Karmen Bongo, MD   1 mg at 08/06/16 1325  . MEDLINE mouth rinse  15 mL Mouth Rinse q12n4p Karmen Bongo, MD   15 mL at 08/06/16 1703  . methylPREDNISolone sodium succinate (SOLU-MEDROL) 125 mg/2 mL injection 125 mg  125 mg Intravenous Q12H Patrecia Pour, MD   125 mg at 08/06/16 2121  . metoprolol tartrate (LOPRESSOR) tablet 12.5 mg  12.5 mg Oral BID Rigoberto Noel, MD   Stopped at 08/06/16 1327  . multivitamin with minerals tablet 1 tablet  1 tablet Oral Daily Karmen Bongo, MD   1 tablet at 08/06/16 1325  . nicotine (NICODERM CQ - dosed in mg/24 hours) patch 21 mg  21 mg Transdermal Daily Karmen Bongo, MD   21 mg at 08/06/16 1326  . ondansetron (ZOFRAN) tablet 4 mg  4 mg Oral Q6H PRN Karmen Bongo, MD   4 mg at 08/05/16 [id number]   Or  . ondansetron [facility]) injection 4 mg  4 mg Intravenous Q6H PRN Karmen Bongo, MD      . oxyCODONE (Oxy IR/ROXICODONE) immediate release tablet 10 mg  10 mg Oral Q12H PRN Micheline Rough, MD   10 mg at 08/06/16 2120  . phytonadione (VITAMIN K) tablet 10 mg  10 mg Oral Daily Patrecia Pour, MD      . pneumococcal 23 valent vaccine (PNU-IMMUNE) injection 0.5 mL  0.5 mL Intramuscular Tomorrow-1000 Geradine Girt, DO      . rifaximin Doreene Nest) tablet 400 mg  400 mg Oral Q8H Patrecia Pour, MD   400 mg at 08/07/16 0620  . sodium chloride flush (NS) 0.9 % injection 3 mL  3 mL Intravenous Q12H Karmen Bongo, MD   3 mL at 08/06/16 2200  . thiamine (VITAMIN B-1) tablet 100 mg  100 mg Oral Daily Karmen Bongo, MD   100 mg at 08/06/16 1325   Or  . thiamine (B-1) injection 100 mg  100 mg Intravenous Daily Karmen Bongo, MD   100 mg at 07/31/16 2200    Allergies as of 07/31/2016  . (No Known Allergies)     Review of Systems:     Constitutional: No fever or chills HEENT: Eyes: No change in vision               Ears, Nose, Throat:  No change in hearing Skin: No rash Cardiovascular: No chest pain Respiratory: No SOB Gastrointestinal: See HPI and otherwise negative Genitourinary: No dysuria or change in urinary frequency Neurological: No headache Musculoskeletal: No new muscle or joint pain Hematologic: No bruising Psychiatric: No history of depression or anxiety   Physical Exam:  Vital signs in last 24 hours: Temp:  [97.5 F (36.4 C)-98.7 F (37.1 C)] 97.5 F (36.4 C) (01/04 0725) Pulse Rate:  [80-111] 88 (01/04 0800) Resp:  [14-25] 20 (01/04 0800) BP: (74-138)/(47-84) 100/62 (01/04 0800) SpO2:  [88 %-97 %] 92 % (01/04 0800) Weight:  [179 lb 14.3 oz (81.6 kg)-187 lb 2.7 oz (84.9 kg)] 179 lb 14.3 oz (81.6 kg) (01/04 0147) Last BM Date: 07/31/16 General:   Chronically ill appearing African American male appears to be in NAD, alert and cooperative Head:  Normocephalic and atraumatic. Eyes:   PEERL, EOMI. No icterus. Conjunctiva pink. Ears:  Normal auditory acuity. Neck:  Supple Throat: Oral cavity and pharynx without inflammation, swelling or lesion.  Lungs: Respirations even and unlabored. Lungs clear to auscultation bilaterally.   No wheezes, crackles, or rhonchi.    Heart: Normal S1, S2. No MRG. Regular rate and rhythm. No peripheral edema, cyanosis or pallor.  Abdomen:  Soft, Moderate distension, marked epigastric discomfort with only light palpation, some guarding  Normal bowel sounds. No appreciable masses or hepatomegaly. Rectal:  Not performed.  Msk:  Symmetrical without gross deformities. Peripheral pulses intact.  Extremities:  Without edema, no deformity or joint abnormality.  Neurologic:  Alert and  oriented x4;  grossly normal neurologically. Skin:   Dry and intact without significant lesions or rashes. Psychiatric:  Demonstrates good judgement and reason without abnormal affect or behaviors. Poor memory   LAB RESULTS:  Recent Labs  08/06/16 0525 08/06/16 1524 08/07/16 0506  WBC 3.4* 3.0* 3.6*  HGB 9.3* 7.7* 10.1*  HCT 25.0* 20.8* 27.5*  PLT 43* 39* 54*   BMET  Recent Labs  08/05/16 0318 08/06/16 0525 08/06/16 1524  NA 135 132* 134*  K 3.4* 3.7 3.9  CL 100* 100* 98*  CO2 28 26 28   GLUCOSE 150* 158* 163*  BUN 38* 39* 37*  CREATININE 1.40* 1.96* 1.92*  CALCIUM 8.3* 8.3* 8.9   LFT  Recent Labs  08/06/16 0525  PROT 5.9*  ALBUMIN 1.8*  AST 253*  ALT 93*  ALKPHOS 132*  BILITOT 2.0*   PT/INR  Recent Labs  08/05/16 0318 08/06/16 0525  LABPROT 23.2* 22.1*  INR 2.03 1.91    STUDIES: Dg Chest Port 1 View  Result Date: 08/06/2016 CLINICAL DATA:  Initial evaluation for acute hypoxemia. EXAM: PORTABLE CHEST 1 VIEW COMPARISON:  Prior radiograph from 06/26/2015. FINDINGS: Examination is somewhat technically limited by patient positioning as the patient is markedly rotated to the right. Cardiomegaly is stable. Allowing for patient rotation, mediastinal silhouette within normal limits. Aortic atherosclerosis noted. Lungs are hypoinflated. Associated mild bibasilar atelectasis/ bronchovascular crowding. No focal infiltrates. No pulmonary edema or pleural effusion. No pneumothorax. No acute osseous abnormality. Remotely  healed right-sided rib fractures noted. Prominent vascular calcifications noted within the axillae bilaterally. IMPRESSION: 1. Shallow lung inflation with mild bibasilar opacities. Atelectasis/bronchovascular crowding is favored. 2. No other active cardiopulmonary disease identified. 3. Stable mild cardiomegaly without pulmonary edema. 4. Prominent atherosclerosis within the aorta and subclavian/axillary arteries. Electronically Signed   By: Jeannine Boga M.D.   On: 08/06/2016 19:35     PREVIOUS ENDOSCOPIES:            See HPI-More in chart review

## 2016-08-08 ENCOUNTER — Encounter (HOSPITAL_COMMUNITY): Payer: Self-pay | Admitting: Certified Registered Nurse Anesthetist

## 2016-08-08 ENCOUNTER — Encounter (HOSPITAL_COMMUNITY)
Admission: EM | Disposition: A | Discharge status: Skilled Nursing Facility | Payer: Self-pay | Source: Home / Self Care | Attending: Family Medicine

## 2016-08-08 ENCOUNTER — Inpatient Hospital Stay (HOSPITAL_COMMUNITY): Payer: Medicare HMO | Admitting: Certified Registered Nurse Anesthetist

## 2016-08-08 ENCOUNTER — Inpatient Hospital Stay (HOSPITAL_COMMUNITY): Payer: Medicare HMO

## 2016-08-08 DIAGNOSIS — K3189 Other diseases of stomach and duodenum: Secondary | ICD-10-CM

## 2016-08-08 DIAGNOSIS — K766 Portal hypertension: Secondary | ICD-10-CM

## 2016-08-08 DIAGNOSIS — I8510 Secondary esophageal varices without bleeding: Secondary | ICD-10-CM

## 2016-08-08 DIAGNOSIS — K222 Esophageal obstruction: Secondary | ICD-10-CM

## 2016-08-08 DIAGNOSIS — R109 Unspecified abdominal pain: Secondary | ICD-10-CM

## 2016-08-08 HISTORY — PX: ESOPHAGOGASTRODUODENOSCOPY (EGD) WITH PROPOFOL: SHX5813

## 2016-08-08 LAB — COMPREHENSIVE METABOLIC PANEL
ALK PHOS: 126 U/L (ref 38–126)
ALT: 85 U/L — AB (ref 17–63)
ANION GAP: 9 (ref 5–15)
AST: 187 U/L — ABNORMAL HIGH (ref 15–41)
Albumin: 3 g/dL — ABNORMAL LOW (ref 3.5–5.0)
BILIRUBIN TOTAL: 2.1 mg/dL — AB (ref 0.3–1.2)
BUN: 38 mg/dL — ABNORMAL HIGH (ref 6–20)
CO2: 28 mmol/L (ref 22–32)
CREATININE: 1.69 mg/dL — AB (ref 0.61–1.24)
Calcium: 9.1 mg/dL (ref 8.9–10.3)
Chloride: 100 mmol/L — ABNORMAL LOW (ref 101–111)
GFR, EST AFRICAN AMERICAN: 47 mL/min — AB (ref >60)
GFR, EST NON AFRICAN AMERICAN: 41 mL/min — AB (ref >60)
Glucose, Bld: 250 mg/dL — ABNORMAL HIGH (ref 65–99)
Potassium: 4.7 mmol/L (ref 3.5–5.1)
Sodium: 137 mmol/L (ref 135–145)
TOTAL PROTEIN: 5.9 g/dL — AB (ref 6.5–8.1)

## 2016-08-08 LAB — CBC
HEMATOCRIT: 25.7 % — AB (ref 39.0–52.0)
HEMOGLOBIN: 9.6 g/dL — AB (ref 13.0–17.0)
MCH: 36.2 pg — AB (ref 26.0–34.0)
MCHC: 37.4 g/dL — AB (ref 30.0–36.0)
MCV: 97 fL (ref 78.0–100.0)
Platelets: 53 10*3/uL — ABNORMAL LOW (ref 150–400)
RBC: 2.65 MIL/uL — AB (ref 4.22–5.81)
RDW: 23.2 % — ABNORMAL HIGH (ref 11.5–15.5)
WBC: 7.2 10*3/uL (ref 4.0–10.5)

## 2016-08-08 LAB — PREPARE PLATELET PHERESIS: UNIT DIVISION: 0

## 2016-08-08 SURGERY — ESOPHAGOGASTRODUODENOSCOPY (EGD) WITH PROPOFOL
Anesthesia: Monitor Anesthesia Care

## 2016-08-08 MED ORDER — PROPOFOL 10 MG/ML IV BOLUS
INTRAVENOUS | Status: AC
  Filled 2016-08-08: qty 20

## 2016-08-08 MED ORDER — HYDROMORPHONE HCL 1 MG/ML IJ SOLN
0.2500 mg | INTRAMUSCULAR | Status: DC | PRN
  Administered 2016-08-09: 0.5 mg via INTRAVENOUS
  Filled 2016-08-08: qty 0.5

## 2016-08-08 MED ORDER — SODIUM CHLORIDE 0.9 % IV SOLN
INTRAVENOUS | Status: DC | PRN
  Administered 2016-08-08: 14:00:00 via INTRAVENOUS

## 2016-08-08 MED ORDER — LIDOCAINE 2% (20 MG/ML) 5 ML SYRINGE
INTRAMUSCULAR | Status: DC | PRN
  Administered 2016-08-08: 100 mg via INTRAVENOUS

## 2016-08-08 MED ORDER — PHENYLEPHRINE 40 MCG/ML (10ML) SYRINGE FOR IV PUSH (FOR BLOOD PRESSURE SUPPORT)
PREFILLED_SYRINGE | INTRAVENOUS | Status: DC | PRN
  Administered 2016-08-08: 40 ug via INTRAVENOUS
  Administered 2016-08-08: 80 ug via INTRAVENOUS
  Administered 2016-08-08: 40 ug via INTRAVENOUS

## 2016-08-08 MED ORDER — PROPOFOL 500 MG/50ML IV EMUL
INTRAVENOUS | Status: DC | PRN
  Administered 2016-08-08: 100 ug/kg/min via INTRAVENOUS

## 2016-08-08 MED ORDER — PROPOFOL 10 MG/ML IV BOLUS
INTRAVENOUS | Status: DC | PRN
  Administered 2016-08-08: 50 mg via INTRAVENOUS
  Administered 2016-08-08: 10 mg via INTRAVENOUS

## 2016-08-08 MED ORDER — RIFAXIMIN 550 MG PO TABS
550.0000 mg | ORAL_TABLET | Freq: Two times a day (BID) | ORAL | Status: DC
  Administered 2016-08-08 – 2016-08-13 (×10): 550 mg via ORAL
  Filled 2016-08-08 (×11): qty 1

## 2016-08-08 MED ORDER — LACTULOSE 10 GM/15ML PO SOLN
10.0000 g | Freq: Every day | ORAL | Status: DC
  Administered 2016-08-08 – 2016-08-13 (×6): 10 g via ORAL
  Filled 2016-08-08 (×6): qty 15

## 2016-08-08 SURGICAL SUPPLY — 14 items

## 2016-08-08 NOTE — Progress Notes (Signed)
SLP Cancellation Note  Patient Details Name: Patrick [name] MRN: [id number] DOB: Aug 05, 1950   Cancelled treatment:       Reason Eval/Treat Not Completed:  (pt npo)   [name], [name] [facility] SLP [phone]

## 2016-08-08 NOTE — Transfer of Care (Signed)
Immediate Anesthesia Transfer of Care Note  Patient: Keefe Zawistowski  Procedure(s) Performed: Procedure(s): ESOPHAGOGASTRODUODENOSCOPY (EGD) WITH PROPOFOL (N/A)  Patient Location: PACU  Anesthesia Type:MAC  Level of Consciousness: Patient easily awoken, sedated, comfortable, cooperative, following commands, responds to stimulation.   Airway & Oxygen Therapy: Patient spontaneously breathing, ventilating well, oxygen via simple oxygen mask.  Post-op Assessment: Report given to PACU RN, vital signs reviewed and stable, moving all extremities.   Post vital signs: Reviewed and stable.  Complications: No apparent anesthesia complications Last Vitals:  Vitals:   08/08/16 1200 08/08/16 1314  BP: 101/67 106/69  Pulse: 81 81  Resp: 16 18  Temp:  36.7 C    Last Pain:  Vitals:   08/08/16 1314  TempSrc: Oral  PainSc:       Patients Stated Pain Goal: 3 (62/70/35 0093)  Complications: No apparent anesthesia complications

## 2016-08-08 NOTE — Progress Notes (Signed)
PT Cancellation Note  Patient Details Name: Patrick [name] MRN: [id number] DOB: [birth year]   Cancelled Treatment:     pt scheduled for EGD and paracentesis.  RN reports pt is also in a lot of pain. Will hold off for today   Rica Koyanagi  PTA [facility]  Acute  Rehab Pager      [phone]

## 2016-08-08 NOTE — Op Note (Signed)
[facility] Patient Name: Patrick [name] Procedure Date: 08/08/2016 MRN: [id number] Attending MD: Gatha Mayer , MD Date of Birth: November 24, 1950 CSN: [id number] Age: 66 Admit Type: Inpatient Procedure:                Upper GI endoscopy Indications:              Dysphagia, Heme positive stool Providers:                Gatha Mayer, MD, Cleda Daub, RN, Corliss Parish, Technician Referring MD:              Medicines:                Propofol per Anesthesia, Monitored Anesthesia Care Complications:            No immediate complications. Estimated Blood Loss:     Estimated blood loss was minimal. Procedure:                Pre-Anesthesia Assessment:                           - Prior to the procedure, a History and Physical                            was performed, and patient medications and                            allergies were reviewed. The patient's tolerance of                            previous anesthesia was also reviewed. The risks                            and benefits of the procedure and the sedation                            options and risks were discussed with the patient.                            All questions were answered, and informed consent                            was obtained. Prior Anticoagulants: The patient has                            taken no previous anticoagulant or antiplatelet                            agents. ASA Grade Assessment: III - A patient with                            severe systemic disease. After reviewing the risks  and benefits, the patient was deemed in                            satisfactory condition to undergo the procedure.                           After obtaining informed consent, the endoscope was                            passed under direct vision. Throughout the                            procedure, the patient's blood pressure, pulse, and                    oxygen saturations were monitored continuously. The                            [id number]) scope was introduced through the                            mouth, and advanced to the second part of duodenum.                            The upper GI endoscopy was accomplished without                            difficulty. The patient tolerated the procedure                            well. Scope In: Scope Out: Findings:      One severe benign-appearing, intrinsic stenosis was found in the       proximal esophagus. This measured 8 mm (inner diameter) and was       traversed after dilation. A TTS dilator was passed through the scope.       Dilation with a 05-15-11 mm balloon and a 12-13.5-15 mm balloon dilator       was performed to 13.5 mm. The dilation site was examined and showed       moderate improvement in luminal narrowing. Estimated blood loss was       minimal.      Small (< 5 mm) varices were found in the lower third of the esophagus.       They were 3 mm in largest diameter.      Moderate portal hypertensive gastropathy was found in the cardia, in the       gastric fundus and in the gastric body. Estimated blood loss: none.      The exam was otherwise without abnormality.      The cardia and gastric fundus were normal on retroflexion. Impression:               - Benign-appearing esophageal stenosis. Dilated.                           - Small (< 5 mm) esophageal varices.                           -  Portal hypertensive gastropathy.                           - The examination was otherwise normal.                           - No specimens collected. Moderate Sedation:      N/A- Per Anesthesia Care Recommendation:           - Return patient to hospital ward for ongoing care.                           - Advance diet as tolerated and clear liquid diet.                           - Continue present medications.                           - Cannot say he bled from these  small varices (3                            columns) w/o stigmata or the gastropathy though                            possible.                           Stricture looks post-XRT and benign                           I have ordered Xifaxan to b 550 mg bid and added                            some lactulose for hepatic encephalopathy                           Prognosis is poor overall but som parameters are                            better - do agree that living alone will not work                            and that a SNF is best for his medical care/health                            stuation                           we will f/u w/in a few days - he should be able to                            eat better after esophageal dilation Procedure Code(s):        --- Professional ---  [phone], Esophagogastroduodenoscopy, flexible,                            transoral; with transendoscopic balloon dilation of                            esophagus (less than 30 mm diameter) Diagnosis Code(s):        --- Professional ---                           K22.2, Esophageal obstruction                           I85.00, Esophageal varices without bleeding                           K76.6, Portal hypertension                           K31.89, Other diseases of stomach and duodenum                           R13.10, Dysphagia, unspecified                           R19.5, Other fecal abnormalities CPT copyright 2016 [employer]. All rights reserved. The codes documented in this report are preliminary and upon coder review may  be revised to meet current compliance requirements. Gatha Mayer, MD 08/08/2016 2:40:43 PM This report has been signed electronically. Number of Addenda: 0

## 2016-08-08 NOTE — Anesthesia Postprocedure Evaluation (Addendum)
Anesthesia Post Note  Patient: Demani Mcbrien  Procedure(s) Performed: Procedure(s) (LRB): ESOPHAGOGASTRODUODENOSCOPY (EGD) WITH PROPOFOL (N/A)  Patient location during evaluation: Endoscopy Anesthesia Type: MAC Level of consciousness: awake and alert Pain management: pain level controlled Vital Signs Assessment: post-procedure vital signs reviewed and stable Respiratory status: spontaneous breathing and respiratory function stable Cardiovascular status: stable Anesthetic complications: no       Last Vitals:  Vitals:   08/08/16 1431 08/08/16 1440  BP: 92/67 110/71  Pulse: 84 75  Resp: 18 15  Temp:      Last Pain:  Vitals:   08/08/16 1431  TempSrc: Oral  PainSc:                  Zerline Melchior DANIEL

## 2016-08-08 NOTE — Anesthesia Preprocedure Evaluation (Addendum)
Anesthesia Evaluation  Patient identified by MRN, date of birth, ID band Patient awake    Reviewed: Allergy & Precautions, H&P , NPO status , Patient's Chart, lab work & pertinent test results  History of Anesthesia Complications Negative for: history of anesthetic complications  Airway Mallampati: I  TM Distance: >3 FB Neck ROM: Full    Dental  (+) Edentulous Upper, Dental Advisory Given   Pulmonary Current Smoker, former smoker,    + rhonchi        Cardiovascular hypertension, Pt. on medications and Pt. on home beta blockers + CAD and + Past MI   Rhythm:Regular Rate:Normal  Echo 04/27/2015 - Left ventricle: There is hypokinesis of the basal inferior andinferolateral walls. The cavity size was normal. There was mildconcentric hypertrophy. Systolic function was normal. Theestimated ejection fraction was in the range of 55% to 60%. Wallmotion was normal; there were no regional wall motionabnormalities. Doppler parameters are consistent with abnormalleft ventricular relaxation (grade 1 diastolic dysfunction).There was no evidence of elevated ventricular filling pressure byDoppler parameters. - Aortic valve: Trileaflet; normal thickness leaflets. There wasmild regurgitation. - Aortic root: The aortic root was normal in size. - Mitral valve: Mildly thickened leaflets . - Right ventricle: The cavity size was normal. Wall thickness wasnormal. Systolic function was normal. - Right atrium: The atrium was normal in size. - Tricuspid valve: There was trivial regurgitation. - Pulmonary arteries: Systolic pressure was within the normalrange. - Inferior vena cava: The vessel was normal in size. - Pericardium, extracardiac: There was no pericardial effusion.     Neuro/Psych PSYCHIATRIC DISORDERS Anxiety negative neurological ROS     GI/Hepatic GERD  Medicated and Controlled,(+)     substance abuse  alcohol use, Hepatitis -,  CShort-segment Barret's esophagus; esophageal cancer   Endo/Other  negative endocrine ROS  Renal/GU negative Renal ROS     Musculoskeletal  (+) Arthritis ,   Abdominal   Peds  Hematology  (+) Blood dyscrasia, anemia , Platelets 103k   Anesthesia Other Findings   Reproductive/Obstetrics negative OB ROS                             Anesthesia Physical  Anesthesia Plan  ASA: III  Anesthesia Plan: MAC   Post-op Pain Management:    Induction: Intravenous  Airway Management Planned: Nasal Cannula  Additional Equipment:   Intra-op Plan:   Post-operative Plan:   Informed Consent: I have reviewed the patients History and Physical, chart, labs and discussed the procedure including the risks, benefits and alternatives for the proposed anesthesia with the patient or authorized representative who has indicated his/her understanding and acceptance.   Dental advisory given  Plan Discussed with: CRNA  Anesthesia Plan Comments:         Anesthesia Quick Evaluation

## 2016-08-08 NOTE — Progress Notes (Signed)
PROGRESS NOTE  Patrick [name]  [id number] DOB: January 12, 1951 DOA: 07/31/2016 PCP: Javier Docker, MD  Brief Narrative: 66 y.o.malewith medical history significant of CAD s/p PCI, Hep C, alcoholic cirrhosis, polysubstance abuse, and esophageal CA s/p chemo and XRT  in remission presenting with increasing abdominal distention. He endorsed decreased po intake, had been drinking alcohol, on average about a fifth per day. 3L of transudative ascitic fluid was removed with subsequently negative cultures. Acute kidney injury was noted on admission. Urinalysis unremarkable, CT abdomen showed atrophic right kidney and nonobstructing left renal stone. After marginal improvement in creatinine (1.93 > 1.40), renal function worsened with concern for hepatorenal syndrome and albumin was given on 01/03. On admission he had heme positive stool and has been given vitamin K for elevated INR on admission, most recently down to 1.9. On 01/03 he became hypotensive with a drop in hemoglobin (12.3 >> 7.7) and was transfused 1u PRBCs. Previous EGD in March 2017 by Dr. Carlean Purl showed no varices and negative biopsy. GI is asked to consult.   Assessment & Plan: Acute liver failure with ascites due to chronic alcoholic cirrhosis: Child-Pugh score 12 (Class C), INR 2.10 at admission. 3L transudative paracentesis 12/29 without evidence of SBP. - Holding lasix/spironolactone due to hypotension. - Ammonia mildly elevated at 41, so rifaximin was started 1/1. - Monitoring resultant thrombocytopenia and coagulopathy - Palliative care consulted. - Abdominal distention worsening, repeat therapeutic paracentesis ordered 01/04.  Upper GI bleed with acute on chronic anemia: Heme positive in ED without gross GI bleeding since that time. Patient has coagulopathy with elevated INR and thrombocytopenia. Hemoglobin dropped to 7.7 on 01/03 prompting 1u PRBCs and platelet transfusion with improvement to 10.1, essentially stable since that  time, 9.6 on 01/05. - GI planning EGD today - Check CBC daily now that hgb stable. - Given vitamin K 10mg  x2. Latest INR 1.9. Continue vitamin K and monitor daily. - Continue SDU   Acute renal failure likely in the setting of ascites and liver disease versus  progressive chronic kidney disease. UA unremarkable. CT scan of abdomen showed atrophic right kidney and nonobstructive 3 mm stone in the left kidney with no left hydroureter. FENa 01/04 was 0.1% indicating prerenal etiology. - Continue to monitor daily.  - Avoid nephrotoxins. - Refractory renal failure with hypotension: improved with transfusion and albumin 1.5g/kg 01/03. Consider repeat albumin 01/06.  Severe malnutrition: s/p PEG tube due to dysphagia during treatment for esophageal CA. Not taking good po since admission. - Nutrition consulted.  - Continue dysphagia diet per SLP - Consider TPN/NGT/PEG depending on clinical course/pt wishes.  Constipation: Suspect due to poor po. - Senna BID prn - Paracentesis expected to help with fecal urgency and improve po.  Peripheral neuropathy: Chronic, due to EtOH. - Continue gabapentin and fentanyl per palliative  Thrombocytopenia: Chronic, stable. Received transfusion 01/04. - Monitor CBC - Holding ASA  Prolonged QT - Will need to judiciously use medications which may increase the QT interval - Continue on tele  Chronic ETOH dependence: without evidence of withdrawal. Reports last drink was before Christmas. - Refer to outpatient rehabilitation. - Will continue vitamins and supportive care.   Tobacco dependence - Encourage cessation.  - Nicotine patch ordered  Physical deconditioning:  - PT recommended SNF but patient declines - hoping family will be able to help with this. - Given multiple comorbidities, consulted for palliative care service.  DVT prophylaxis: SCD. No anticoagulation because of coagulopathy and thrombocytopenia Code Status: DNR Family Communication:  Family meeting  again today. Sister is officially Economist.  Disposition Plan: Continue SDU.  Consultants:   Palliative care  CCM  GI  Procedures: U/S-guided paracentesis Antimicrobials: None  Subjective: Pt feels slightly better this morning, breathing better than yesterday. Abdomen is larger and more tight. Pain controlled. No N/V/D - feels urge to defecate but only getting gas.  Objective: Vitals:   08/08/16 0400 08/08/16 0500 08/08/16 0746 08/08/16 0800  BP: 105/68  102/69   Pulse: 83  89   Resp: (!) 24  16   Temp:    98.4 F (36.9 C)  TempSrc:    Oral  SpO2: 90%  94%   Weight:  81.9 kg (180 lb 8.9 oz)    Height:        Intake/Output Summary (Last 24 hours) at 08/08/16 0921 Last data filed at 08/08/16 0600  Gross per 24 hour  Intake              550 ml  Output              600 ml  Net              -50 ml   Filed Weights   08/06/16 1732 08/07/16 0147 08/08/16 0500  Weight: 84.9 kg (187 lb 2.7 oz) 81.6 kg (179 lb 14.3 oz) 81.9 kg (180 lb 8.9 oz)    Examination: General exam: Ill-looking male lying with elevated HOB more comfortable.  Respiratory system: Clear to auscultation. Respiratory effort normal. No wheezing or crackle Cardiovascular system: S1 & S2 heard, RRR.  No pedal edema. Gastrointestinal system: Abdomen is more distended, nontender. Bowel sound positive. Central nervous system: Alert, awake and following commands. Diffusely weak without focal sensorimotor deficits. Upper extremities with mild tremor. Extremities: No deformities Skin: No rashes, lesions or ulcers Psychiatry: Judgement and insight appear normal. Depressed mood with flattened affect.  Data Reviewed: I have personally reviewed following labs and imaging studies  CBC:  Recent Labs Lab 08/06/16 0525 08/06/16 1524 08/07/16 0506 08/07/16 1403 08/07/16 2052 08/08/16 0354  WBC 3.4* 3.0* 3.6* 5.3 6.6 7.2  NEUTROABS 1.8  --   --   --   --   --   HGB 9.3* 7.7* 10.1* 9.6* 9.9* 9.6*  HCT  25.0* 20.8* 27.5* 27.1* 26.3* 25.7*  MCV 100.4* 100.0 97.9 97.8 96.3 97.0  PLT 43* 39* 54* 53* 89* 53*   Basic Metabolic Panel:  Recent Labs Lab 08/05/16 0318 08/06/16 0525 08/06/16 1524 08/07/16 1403 08/08/16 0354  NA 135 132* 134* 132* 137  K 3.4* 3.7 3.9 4.3 4.7  CL 100* 100* 98* 98* 100*  CO2 28 26 28 25 28   GLUCOSE 150* 158* 163* 322* 250*  BUN 38* 39* 37* 36* 38*  CREATININE 1.40* 1.96* 1.92* 1.82* 1.69*  CALCIUM 8.3* 8.3* 8.9 8.9 9.1   GFR: Estimated Creatinine Clearance: 43.6 mL/min (by C-G formula based on SCr of 1.69 mg/dL (H)). Liver Function Tests:  Recent Labs Lab 08/02/16 0706 08/03/16 0508 08/06/16 0525 08/07/16 1403 08/08/16 0354  AST 120* 148* 253* 217* 187*  ALT 45 56 93* 86* 85*  ALKPHOS 94 110 132* 122 126  BILITOT 3.0* 3.4* 2.0* 2.5* 2.1*  PROT 5.9* 7.2 5.9* 6.5 5.9*  ALBUMIN 1.9* 2.1* 1.8* 3.3* 3.0*   No results for input(s): LIPASE, AMYLASE in the last 168 hours.  Recent Labs Lab 08/02/16 1005  AMMONIA 41*   Coagulation Profile:  Recent Labs Lab 08/05/16 0318 08/06/16 0525  INR 2.03 1.91  Cardiac Enzymes: No results for input(s): CKTOTAL, CKMB, CKMBINDEX, TROPONINI in the last 168 hours. BNP (last 3 results) No results for input(s): PROBNP in the last 8760 hours. HbA1C: No results for input(s): HGBA1C in the last 72 hours. CBG:  Recent Labs Lab 08/04/16 0740 08/04/16 1217  GLUCAP 132* 162*   Lipid Profile: No results for input(s): CHOL, HDL, LDLCALC, TRIG, CHOLHDL, LDLDIRECT in the last 72 hours. Thyroid Function Tests: No results for input(s): TSH, T4TOTAL, FREET4, T3FREE, THYROIDAB in the last 72 hours. Anemia Panel: No results for input(s): VITAMINB12, FOLATE, FERRITIN, TIBC, IRON, RETICCTPCT in the last 72 hours. Sepsis Labs: No results for input(s): PROCALCITON, LATICACIDVEN in the last 168 hours.  Recent Results (from the past 240 hour(s))  Culture, body fluid-bottle     Status: None   Collection Time:  08/01/16 10:38 AM  Result Value Ref Range Status   Specimen Description FLUID PERITONEAL  Final   Special Requests NONE  Final   Culture   Final    NO GROWTH 5 DAYS Performed at [facility]    Report Status 08/06/2016 FINAL  Final  Gram stain     Status: None   Collection Time: 08/01/16 10:38 AM  Result Value Ref Range Status   Specimen Description FLUID PERITONEAL  Final   Special Requests NONE  Final   Gram Stain   Final    RARE WBC PRESENT,BOTH PMN AND MONONUCLEAR NO ORGANISMS SEEN Performed at [facility]    Report Status 08/01/2016 FINAL  Final  MRSA PCR Screening     Status: None   Collection Time: 08/06/16  5:00 PM  Result Value Ref Range Status   MRSA by PCR NEGATIVE NEGATIVE Final    Comment:        The GeneXpert MRSA Assay (FDA approved for NASAL specimens only), is one component of a comprehensive MRSA colonization surveillance program. It is not intended to diagnose MRSA infection nor to guide or monitor treatment for MRSA infections.     Radiology Studies: Dg Chest Port 1 View  Result Date: 08/06/2016 CLINICAL DATA:  Initial evaluation for acute hypoxemia. EXAM: PORTABLE CHEST 1 VIEW COMPARISON:  Prior radiograph from 06/26/2015. FINDINGS: Examination is somewhat technically limited by patient positioning as the patient is markedly rotated to the right. Cardiomegaly is stable. Allowing for patient rotation, mediastinal silhouette within normal limits. Aortic atherosclerosis noted. Lungs are hypoinflated. Associated mild bibasilar atelectasis/ bronchovascular crowding. No focal infiltrates. No pulmonary edema or pleural effusion. No pneumothorax. No acute osseous abnormality. Remotely healed right-sided rib fractures noted. Prominent vascular calcifications noted within the axillae bilaterally. IMPRESSION: 1. Shallow lung inflation with mild bibasilar opacities. Atelectasis/bronchovascular crowding is favored. 2. No other active cardiopulmonary  disease identified. 3. Stable mild cardiomegaly without pulmonary edema. 4. Prominent atherosclerosis within the aorta and subclavian/axillary arteries. Electronically Signed   By: Jeannine Boga M.D.   On: 08/06/2016 19:35   Scheduled Meds: . chlorhexidine  15 mL Mouth Rinse BID  . feeding supplement (ENSURE ENLIVE)  237 mL Oral 5 X Daily  . folic acid  1 mg Oral Daily  . mouth rinse  15 mL Mouth Rinse q12n4p  . methylPREDNISolone (SOLU-MEDROL) injection  125 mg Intravenous Q12H  . metoprolol tartrate  12.5 mg Oral BID  . multivitamin with minerals  1 tablet Oral Daily  . nicotine  21 mg Transdermal Daily  . phytonadione  10 mg Oral Daily  . pneumococcal 23 valent vaccine  0.5 mL Intramuscular Tomorrow-1000  .  rifaximin  400 mg Oral Q8H  . sodium chloride flush  3 mL Intravenous Q12H  . thiamine  100 mg Oral Daily   Or  . thiamine  100 mg Intravenous Daily   Time spent: 25 min   LOS: 8 days   Vance Gather, MD [facility] Pager [phone]   If 7PM-7AM, please contact night-coverage [email or web address] Password [email or web address] 08/08/2016, 9:21 AM

## 2016-08-08 NOTE — Progress Notes (Signed)
Daily Progress Note   Patient Name: Patrick [name]       Date: 08/08/2016 DOB: 14-Oct-1950  Age: 66 y.o. MRN#: [id number] Attending Physician: Patrecia Pour, MD Primary Care Physician: Javier Docker, MD Admit Date: 07/31/2016  Reason for Consultation/Follow-up: Establishing goals of care, Non pain symptom management and Pain control  Subjective: I met with Mr. Almas, his mother, and his sister.  We reviewed his clinical course.  We discussed plan to continue current therapies without escalation to pressor support and assess his condition daily.  See below.  Length of Stay: 8  Current Medications: Scheduled Meds:  . chlorhexidine  15 mL Mouth Rinse BID  . feeding supplement (ENSURE ENLIVE)  237 mL Oral 5 X Daily  . folic acid  1 mg Oral Daily  . mouth rinse  15 mL Mouth Rinse q12n4p  . methylPREDNISolone (SOLU-MEDROL) injection  125 mg Intravenous Q12H  . metoprolol tartrate  12.5 mg Oral BID  . multivitamin with minerals  1 tablet Oral Daily  . nicotine  21 mg Transdermal Daily  . phytonadione  10 mg Oral Daily  . pneumococcal 23 valent vaccine  0.5 mL Intramuscular Tomorrow-1000  . rifaximin  400 mg Oral Q8H  . sodium chloride flush  3 mL Intravenous Q12H  . thiamine  100 mg Oral Daily   Or  . thiamine  100 mg Intravenous Daily    Continuous Infusions:   PRN Meds: fentaNYL (SUBLIMAZE) injection, ondansetron **OR** ondansetron (ZOFRAN) IV, oxyCODONE, senna  Physical Exam   General: Ill-appearing male, slow to answer questions, but answers appropriately, no acute distress.  HEENT: No bruits, no goiter, no JVD Heart: Regular rate and rhythm. No murmur appreciated. Lungs: Good air movement, clear Abdomen: Soft, nontender, distended, positive bowel sounds.  Ext: + LE  edema Skin: Warm and dry        Vital Signs: BP 100/67   Pulse 85   Temp 98.4 F (36.9 C) (Oral)   Resp 17   Ht '5\' 9"'$  (1.753 m)   Wt 81.9 kg (180 lb 8.9 oz)   SpO2 90%   BMI 26.66 kg/m  SpO2: SpO2: 90 % O2 Device: O2 Device: Nasal Cannula O2 Flow Rate: O2 Flow Rate (L/min): 3 L/min  Intake/output summary:   Intake/Output Summary (Last 24 hours) at 08/08/16 1131  Last data filed at 08/08/16 0900  Gross per 24 hour  Intake              550 ml  Output              720 ml  Net             -170 ml   LBM: Last BM Date: 07/31/16 Baseline Weight: Weight: 78.9 kg (173 lb 15.1 oz) Most recent weight: Weight: 81.9 kg (180 lb 8.9 oz)       Palliative Assessment/Data:    Flowsheet Rows   Flowsheet Row Most Recent Value  Intake Tab  Referral Department  Hospitalist  Unit at Time of Referral  Med/Surg Unit  Palliative Care Primary Diagnosis  Other (Comment) [Hepatic]  Date Notified  08/05/16  Palliative Care Type  New Palliative care  Reason for referral  Clarify Goals of Care  Date of Admission  07/31/16  Date first seen by Palliative Care  08/05/16  # of days Palliative referral response time  0 Day(s)  # of days IP prior to Palliative referral  5  Clinical Assessment  Palliative Performance Scale Score  50%  Pain Max last 24 hours  7  Pain Min Last 24 hours  0  Psychosocial & Spiritual Assessment  Palliative Care Outcomes  Patient/Family meeting held?  Yes  Who was at the meeting?  Patient  Palliative Care Outcomes  Provided advance care planning, ACP counseling assistance      Patient Active Problem List   Diagnosis Date Noted  . Heme + stool   . Decreased hemoglobin   . Palliative care encounter   . Goals of care, counseling/discussion   . Acute liver failure 08/01/2016  . Ascites 08/01/2016  . Acute renal failure ([place]) 08/01/2016  . Melena 08/01/2016  . Protein-calorie malnutrition, severe ([place]) 08/01/2016  . Alcohol withdrawal ([place]) 08/01/2016  .  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 Chemotherapy-induced neuropathy ([place]) 10/19/2015  . Constipation 08/03/2015  . Cancer associated pain 08/03/2015  . Nausea with vomiting 08/03/2015  . Acute confusion   . Esophageal dysphagia 06/26/2015  . Acute encephalopathy 06/26/2015  . 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 Cancer of thoracic esophagus ([place]) 05/11/2015  . Alcohol-induced acute pancreatitis   . Abnormal CT scan, esophagus   . Abnormal weight loss   . Dysphagia, pharyngoesophageal phase   . Fall 04/26/2015  . Numbness 04/26/2015  . Protein-calorie malnutrition, moderate ([place]) 04/26/2015  . Weakness 04/26/2015  . Abdominal pain 04/26/2015  . Pancreatitis 04/26/2015  . Alcohol abuse   . Dysphagia   . Prolonged QT interval   . Essential hypertension   . Polysubstance abuse 09/03/2012  . Leukopenia and Thrombocytopenia 01/02/2012  . Chronic hepatitis C with cirrhosis ([place]) 01/02/2012  . Alcoholism ([place]) 01/02/2012  . Barrett's esophagus 11/17/2011  . History of colonic polyps 11/11/2011  . Thrombocytopenia ([place]) 03/03/2011  . Chest pain 12/03/2010  . CAD (coronary artery disease) 12/03/2010  . Hyperlipidemia 12/03/2010  . Tobacco abuse 12/03/2010  . Cocaine abuse 12/03/2010    Palliative Care Assessment & Plan   Patient Profile: 66 y.o. male  with past medical history of CAD s/p PCI, Hep C, alcoholic cirrhosis, polysubstance abuse, thrombocytopenia, and esophageal CA s/p PEG tube placement and subsequent removal admitted on 07/31/2016 with hepatic failure, renal failure.  Palliative consulted for goals of care for hepatic  failure.  Recommendations/Plan:  Plan today for EGD/paracentesis.  In talking with him and his family, plan remains to continue current care and continue to assess progress daily.  He is clear that he does not want prolonged  hospitalization if he is not improving.  Constipation: several days since last BM.  Would increase senna to 2 tabs BID and add miralax unless GI with preference for other therapies.     Pain: Improved on fentanyl 51mg Q2H as needed.  He would like to receive medication for pain regardless of concern for hypotension or other side effects.  He feels strongly that being comfortable is top priority if he declines.  Appreciate Chaplain working to establish his sister as his H15    Code Status  DNR  Prognosis:   Unable to determine  Discharge Planning:  To Be Determined  Care plan was discussed with patient, RN, Dr GBonner Puna Pt sister, ZLorn Junes Thank you for allowing the Palliative Medicine Team to assist in the care of this patient.   Time In: 1100 Time Out: 1130 Total Time 30 Prolonged Time Billed No      Greater than 50%  of this time was spent counseling and coordinating care related to the above assessment and plan.  GMicheline Rough MD  Please contact Palliative Medicine Team phone at [phone] questions and concerns.

## 2016-08-08 NOTE — Procedures (Signed)
Ultrasound-guided  therapeutic paracentesis performed yielding 1.6 liters of slightly hazy, yellow  fluid. No immediate complications.

## 2016-08-09 LAB — COMPREHENSIVE METABOLIC PANEL
ALT: 97 U/L — AB (ref 17–63)
AST: 171 U/L — ABNORMAL HIGH (ref 15–41)
Albumin: 3 g/dL — ABNORMAL LOW (ref 3.5–5.0)
Alkaline Phosphatase: 125 U/L (ref 38–126)
Anion gap: 8 (ref 5–15)
BUN: 44 mg/dL — ABNORMAL HIGH (ref 6–20)
CALCIUM: 9 mg/dL (ref 8.9–10.3)
CHLORIDE: 99 mmol/L — AB (ref 101–111)
CO2: 27 mmol/L (ref 22–32)
CREATININE: 1.64 mg/dL — AB (ref 0.61–1.24)
GFR, EST AFRICAN AMERICAN: 49 mL/min — AB (ref >60)
GFR, EST NON AFRICAN AMERICAN: 42 mL/min — AB (ref >60)
Glucose, Bld: 285 mg/dL — ABNORMAL HIGH (ref 65–99)
Potassium: 4.3 mmol/L (ref 3.5–5.1)
Sodium: 134 mmol/L — ABNORMAL LOW (ref 135–145)
Total Bilirubin: 2.2 mg/dL — ABNORMAL HIGH (ref 0.3–1.2)
Total Protein: 6.2 g/dL — ABNORMAL LOW (ref 6.5–8.1)

## 2016-08-09 LAB — CBC
HCT: 27.2 % — ABNORMAL LOW (ref 39.0–52.0)
Hemoglobin: 9.9 g/dL — ABNORMAL LOW (ref 13.0–17.0)
MCH: 35.5 pg — AB (ref 26.0–34.0)
MCHC: 36.4 g/dL — AB (ref 30.0–36.0)
MCV: 97.5 fL (ref 78.0–100.0)
PLATELETS: 58 10*3/uL — AB (ref 150–400)
RBC: 2.79 MIL/uL — AB (ref 4.22–5.81)
RDW: 23.1 % — ABNORMAL HIGH (ref 11.5–15.5)
WBC: 6.5 10*3/uL (ref 4.0–10.5)

## 2016-08-09 LAB — PROTIME-INR
INR: 1.64
Prothrombin Time: 19.6 seconds — ABNORMAL HIGH (ref 11.4–15.2)

## 2016-08-09 MED ORDER — METHYLPREDNISOLONE SODIUM SUCC 125 MG IJ SOLR
60.0000 mg | Freq: Two times a day (BID) | INTRAMUSCULAR | Status: DC
  Administered 2016-08-09 – 2016-08-10 (×2): 60 mg via INTRAVENOUS
  Filled 2016-08-09 (×2): qty 2

## 2016-08-09 MED ORDER — BISACODYL 10 MG RE SUPP
10.0000 mg | Freq: Once | RECTAL | Status: AC
  Administered 2016-08-09: 10 mg via RECTAL
  Filled 2016-08-09: qty 1

## 2016-08-09 NOTE — Progress Notes (Signed)
[facility]  PROGRESS NOTE  Patrick [name] [id number] DOB: 1951/04/09 DOA: 07/31/2016 PCP: Javier Docker, MD   Brief HPI:   *66 y.o.malewith medical history significant of CAD s/p PCI, Hep C, alcoholic cirrhosis, polysubstance abuse, and esophageal CA s/p chemo and XRT  in remission presenting with increasing abdominal distention. He endorsed decreased po intake, had been drinking alcohol, on average about a fifth per day. 3L of transudative ascitic fluid was removed with subsequently negative cultures. Acute kidney injury was noted on admission. Urinalysis unremarkable, CT abdomen showed atrophic right kidney and nonobstructing left renal stone. After marginal improvement in creatinine (1.93 > 1.40), renal function worsened with concern for hepatorenal syndrome and albumin was given on 01/03. On admission he had heme positive stool and has been given vitamin K for elevated INR on admission, most recently down to 1.9. On 01/03 he became hypotensive with a drop in hemoglobin (12.3 >> 7.7) and was transfused 1u PRBCs. Previous EGD in March 2017 by Dr. Carlean Purl showed no varices and negative biopsy. GI is asked to consult.      Subjective   Patient seen and examined, complains of constipation this morning. Had EGD yesterday which showed a significant stenosis which was dilated, no significant source for bleeding. Also underwent paracentesis yesterday, which yielded 1.6 L of slightly hazy yellow fluid.   Assessment/Plan:     1. Acute liver failure with ascites- secondary to chronic alcoholic cirrhosis, Child-Pugh score 12 (Class C), INR 2.10 at admission. 3L transudative paracentesis 12/29 without evidence of SBP. Ammonia 41, started on rifaximin, repeat paracentesis yielded 1.6 L of hazy yellow fluid. Palliative care consulted. Patient was empirically started on Solu-Medrol for hypertension as stress dose steroids, on 08/06/2016. We'll change Solu-Medrol to 60 mg every 12 hours, and  to put off next 3 days. 2. Status post EGD/upper GI bleed- patient had heme-positive stools in the ED without gross GI bleeding, also had elevated INR and thrombocytopenia . Hemoglobin dropped to 7.7 on 08/06/2016, which required 1 unit PRBC and platelet transfusion with improvement of hemoglobin to 10.1. Patient underwent EGD yesterday which showed esophageal stricture post XRT which was dilated. Small less than 5 mm esophageal varices were found. Diet advance as tolerated. Patient to follow-up with GI as outpatient in 2 weeks. 3. Acute kidney injury- likely from ascites, liver disease, dehydration. Renal function back to baseline. Today creatinine is 1.64 4. Severe malnutrition- status post PEG tube placement due to dysphagia for treatment of esophageal carcinoma. Patient now started on soft diet, will get swallow eval. 5. Constipation- no improvement with Senokot tablets, will give Dulcolax suppository 1 today. 6. 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 Continue gabapentin, fentanyl. 7. 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 42,000. Follow CBC in am. 8. Tobacco dependence- nicotine patch     DVT prophylaxis: SCDs, no Lovenox due to thrombocytopenia.  Code Status: DO NOT RESUSCITATE  Family Communication: No family present at bedside   Disposition Plan: Likely skilled nursing facility due to multiple medical problems,PT recommended SNF but patient declines.  palliative care following.   Consultants:  GI  Palliative care  CCM  Procedures:  Ultrasound-guided paracentesis  EGD  Continuous infusions     Antibiotics:   Anti-infectives    Start     Dose/Rate Route Frequency Ordered Stop   08/08/16 1700  rifaximin (XIFAXAN) tablet 550 mg     550 mg Oral 2 times daily 08/08/16 1617     08/05/16 1600  rifaximin (XIFAXAN) tablet 400  mg  Status:  Discontinued     400 mg Oral Every 8 hours 08/05/16 1519 08/08/16 1514   08/05/16 1500  rifaximin  (XIFAXAN) tablet 550 mg  Status:  Discontinued     550 mg Oral Every 8 hours 08/05/16 1447 08/05/16 1519   08/02/16 1500  rifaximin (XIFAXAN) tablet 550 mg  Status:  Discontinued     550 mg Oral 2 times daily 08/02/16 1335 08/05/16 1447       Objective   Vitals:   08/09/16 0400 08/09/16 0500 08/09/16 0800 08/09/16 1000  BP: 107/63  110/80   Pulse: 94  89 (!) 101  Resp: 16  12 (!) 23  Temp: 99.2 F (37.3 C)  98.1 F (36.7 C)   TempSrc: Oral  Oral   SpO2: 92%  93% 93%  Weight:  80.4 kg (177 lb 4 oz)    Height:        Intake/Output Summary (Last 24 hours) at 08/09/16 1059 Last data filed at 08/09/16 1000  Gross per 24 hour  Intake             2190 ml  Output             2175 ml  Net               15 ml   Filed Weights   08/08/16 0500 08/08/16 1314 08/09/16 0500  Weight: 81.9 kg (180 lb 8.9 oz) 81.6 kg (180 lb) 80.4 kg (177 lb 4 oz)     Physical Examination:  General exam: Appears calm and comfortable. Respiratory system: Clear to auscultation. Respiratory effort normal. Cardiovascular system:  RRR. No  murmurs, rubs, gallops. No pedal edema. GI system: Abdomen is nondistended, soft and nontender. No organomegaly.  Central nervous system. No focal neurological deficits. 5 x 5 power in all extremities. Skin: No rashes, lesions or ulcers. Psychiatry: Alert, oriented x 3.Judgement and insight appear normal. Affect normal.    Data Reviewed: I have personally reviewed following labs and imaging studies  CBG:  Recent Labs Lab 08/04/16 0740 08/04/16 1217  GLUCAP 132* 162*    CBC:  Recent Labs Lab 08/06/16 0525  08/07/16 0506 08/07/16 1403 08/07/16 2052 08/08/16 0354 08/09/16 0306  WBC 3.4*  < > 3.6* 5.3 6.6 7.2 6.5  NEUTROABS 1.8  --   --   --   --   --   --   HGB 9.3*  < > 10.1* 9.6* 9.9* 9.6* 9.9*  HCT 25.0*  < > 27.5* 27.1* 26.3* 25.7* 27.2*  MCV 100.4*  < > 97.9 97.8 96.3 97.0 97.5  PLT 43*  < > 54* 53* 89* 53* 58*  < > = values in this interval  not displayed.  Basic Metabolic Panel:  Recent Labs Lab 08/06/16 0525 08/06/16 1524 08/07/16 1403 08/08/16 0354 08/09/16 0306  NA 132* 134* 132* 137 134*  K 3.7 3.9 4.3 4.7 4.3  CL 100* 98* 98* 100* 99*  CO2 26 28 25 28 27   GLUCOSE 158* 163* 322* 250* 285*  BUN 39* 37* 36* 38* 44*  CREATININE 1.96* 1.92* 1.82* 1.69* 1.64*  CALCIUM 8.3* 8.9 8.9 9.1 9.0    Recent Results (from the past 240 hour(s))  Culture, body fluid-bottle     Status: None   Collection Time: 08/01/16 10:38 AM  Result Value Ref Range Status   Specimen Description FLUID PERITONEAL  Final   Special Requests NONE  Final   Culture   Final  NO GROWTH 5 DAYS Performed at [facility]    Report Status 08/06/2016 FINAL  Final  Gram stain     Status: None   Collection Time: 08/01/16 10:38 AM  Result Value Ref Range Status   Specimen Description FLUID PERITONEAL  Final   Special Requests NONE  Final   Gram Stain   Final    RARE WBC PRESENT,BOTH PMN AND MONONUCLEAR NO ORGANISMS SEEN Performed at [facility]    Report Status 08/01/2016 FINAL  Final  MRSA PCR Screening     Status: None   Collection Time: 08/06/16  5:00 PM  Result Value Ref Range Status   MRSA by PCR NEGATIVE NEGATIVE Final    Comment:        The GeneXpert MRSA Assay (FDA approved for NASAL specimens only), is one component of a comprehensive MRSA colonization surveillance program. It is not intended to diagnose MRSA infection nor to guide or monitor treatment for MRSA infections.      Liver Function Tests:  Recent Labs Lab 08/03/16 0508 08/06/16 0525 08/07/16 1403 08/08/16 0354 08/09/16 0306  AST 148* 253* 217* 187* 171*  ALT 56 93* 86* 85* 97*  ALKPHOS 110 132* 122 126 125  BILITOT 3.4* 2.0* 2.5* 2.1* 2.2*  PROT 7.2 5.9* 6.5 5.9* 6.2*  ALBUMIN 2.1* 1.8* 3.3* 3.0* 3.0*        Studies: US Paracentesis  Result Date: 08/08/2016 INDICATION: Cirrhosis, hepatitis-C, esophageal cancer, recurrent ascites.  Request made for therapeutic paracentesis. EXAM: ULTRASOUND GUIDED THERAPEUTIC PARACENTESIS MEDICATIONS: None. COMPLICATIONS: None immediate. PROCEDURE: Informed written consent was obtained from the patient after a discussion of the risks, benefits and alternatives to treatment. A timeout was performed prior to the initiation of the procedure. Initial ultrasound scanning demonstrates a small amount of ascites within the right mid to lower abdominal quadrant. The right mid to lower abdomen was prepped and draped in the usual sterile fashion. 1% lidocaine was used for local anesthesia. Following this, a Yueh catheter was introduced. An ultrasound image was saved for documentation purposes. The paracentesis was performed. The catheter was removed and a dressing was applied. The patient tolerated the procedure well without immediate post procedural complication. FINDINGS: A total of approximately 1.6 liters of slightly hazy, yellow fluid was removed. IMPRESSION: Successful ultrasound-guided therapeutic paracentesis yielding 1.6 liters of peritoneal fluid. Read by: Rowe Robert, PA-C Electronically Signed   By: Aletta Edouard M.D.   On: 08/08/2016 17:02    Scheduled Meds: . chlorhexidine  15 mL Mouth Rinse BID  . feeding supplement (ENSURE ENLIVE)  237 mL Oral 5 X Daily  . folic acid  1 mg Oral Daily  . lactulose  10 g Oral Daily  . mouth rinse  15 mL Mouth Rinse q12n4p  . methylPREDNISolone (SOLU-MEDROL) injection  125 mg Intravenous Q12H  . metoprolol tartrate  12.5 mg Oral BID  . multivitamin with minerals  1 tablet Oral Daily  . nicotine  21 mg Transdermal Daily  . pneumococcal 23 valent vaccine  0.5 mL Intramuscular Tomorrow-1000  . rifaximin  550 mg Oral BID  . sodium chloride flush  3 mL Intravenous Q12H  . thiamine  100 mg Oral Daily   Or  . thiamine  100 mg Intravenous Daily      Time spent: 25 min  [facility] Pager [phone]. If 7PM-7AM, please contact  night-coverage at [email or web address], Office  [phone]  password [email or web address] 08/09/2016, 10:59 AM  LOS: 9 days

## 2016-08-09 NOTE — Progress Notes (Signed)
Daily Progress Note   Patient Name: Patrick [name]       Date: 08/09/2016 DOB: 06/11/1951  Age: 66 y.o. MRN#: [id number] Attending Physician: Oswald Hillock, MD Primary Care Physician: Javier Docker, MD Admit Date: 07/31/2016  Reason for Consultation/Follow-up: Establishing goals of care, Non pain symptom management and Pain control  Subjective: I met with Patrick [name] today.  We reviewed his clinical course.  We discussed plan to continue current therapies without escalation to pressor support and assess his condition daily.  See below.  Length of Stay: 9  Current Medications: Scheduled Meds:  . chlorhexidine  15 mL Mouth Rinse BID  . feeding supplement (ENSURE ENLIVE)  237 mL Oral 5 X Daily  . folic acid  1 mg Oral Daily  . lactulose  10 g Oral Daily  . mouth rinse  15 mL Mouth Rinse q12n4p  . methylPREDNISolone (SOLU-MEDROL) injection  60 mg Intravenous Q12H  . metoprolol tartrate  12.5 mg Oral BID  . multivitamin with minerals  1 tablet Oral Daily  . nicotine  21 mg Transdermal Daily  . pneumococcal 23 valent vaccine  0.5 mL Intramuscular Tomorrow-1000  . rifaximin  550 mg Oral BID  . sodium chloride flush  3 mL Intravenous Q12H  . thiamine  100 mg Oral Daily   Or  . thiamine  100 mg Intravenous Daily    Continuous Infusions:   PRN Meds: fentaNYL (SUBLIMAZE) injection, HYDROmorphone (DILAUDID) injection, ondansetron **OR** ondansetron (ZOFRAN) IV, oxyCODONE, senna  Physical Exam   General: Ill-appearing male, slow to answer questions, but answers appropriately, no acute distress.  HEENT: No bruits, no goiter, no JVD Heart: Regular rate and rhythm. No murmur appreciated. Lungs: Good air movement, clear Abdomen: Soft, nontender, distended, positive bowel sounds.  Ext: +  LE edema Skin: Warm and dry        Vital Signs: BP 110/80   Pulse (!) 101   Temp 98.4 F (36.9 C) (Oral)   Resp (!) 23   Ht '5\' 9"'$  (1.753 m)   Wt 80.4 kg (177 lb 4 oz)   SpO2 93%   BMI 26.18 kg/m  SpO2: SpO2: 93 % O2 Device: O2 Device: Nasal Cannula O2 Flow Rate: O2 Flow Rate (L/min): 3 L/min  Intake/output summary:   Intake/Output Summary (Last 24 hours) at 08/09/16  [place] filed at 08/09/16 1000  Gross per 24 hour  Intake             2190 ml  Output             2100 ml  Net               90 ml   LBM: Last BM Date: 07/31/16 Baseline Weight: Weight: 78.9 kg (173 lb 15.1 oz) Most recent weight: Weight: 80.4 kg (177 lb 4 oz)       Palliative Assessment/Data:    Flowsheet Rows   Flowsheet Row Most Recent Value  Intake Tab  Referral Department  Hospitalist  Unit at Time of Referral  Med/Surg Unit  Palliative Care Primary Diagnosis  Other (Comment) [Hepatic]  Date Notified  08/05/16  Palliative Care Type  New Palliative care  Reason for referral  Clarify Goals of Care  Date of Admission  07/31/16  Date first seen by Palliative Care  08/05/16  # of days Palliative referral response time  0 Day(s)  # of days IP prior to Palliative referral  5  Clinical Assessment  Palliative Performance Scale Score  50%  Pain Max last 24 hours  7  Pain Min Last 24 hours  0  Psychosocial & Spiritual Assessment  Palliative Care Outcomes  Patient/Family meeting held?  Yes  Who was at the meeting?  Patient  Palliative Care Outcomes  Provided advance care planning, ACP counseling assistance      Patient Active Problem List   Diagnosis Date Noted  . Esophageal stricture   . Secondary esophageal varices without bleeding ([place])   . Portal hypertensive gastropathy   . Heme + stool   . Decreased hemoglobin   . Palliative care encounter   . Goals of care, counseling/discussion   . Acute liver failure 08/01/2016  . Ascites 08/01/2016  . Acute renal failure ([place]) 08/01/2016  .  Melena 08/01/2016  . Protein-calorie malnutrition, severe ([place]) 08/01/2016  . Alcohol withdrawal ([place]) 08/01/2016  . 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 Alcoholic peripheral neuropathy ([place]) 10/19/2015  . Chemotherapy-induced neuropathy ([place]) 10/19/2015  . Constipation 08/03/2015  . Cancer associated pain 08/03/2015  . Nausea with vomiting 08/03/2015  . Acute confusion   . Esophageal dysphagia 06/26/2015  . Acute encephalopathy 06/26/2015  . 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)   . 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 Alcohol-induced acute pancreatitis   . Abnormal CT scan, esophagus   . Abnormal weight loss   . Dysphagia, pharyngoesophageal phase   . Fall 04/26/2015  . Numbness 04/26/2015  . Protein-calorie malnutrition, moderate ([place]) 04/26/2015  . Weakness 04/26/2015  . Abdominal pain 04/26/2015  . Pancreatitis 04/26/2015  . Alcohol abuse   . Dysphagia   . Prolonged QT interval   . Essential hypertension   . Polysubstance abuse 09/03/2012  . Leukopenia and Thrombocytopenia 01/02/2012  . Chronic hepatitis C with cirrhosis ([place]) 01/02/2012  . Alcoholism ([place]) 01/02/2012  . Barrett's esophagus 11/17/2011  . History of colonic polyps 11/11/2011  . Thrombocytopenia ([place]) 03/03/2011  . Chest pain 12/03/2010  . CAD (coronary artery disease) 12/03/2010  . Hyperlipidemia 12/03/2010  . Tobacco abuse 12/03/2010  . Cocaine abuse 12/03/2010    Palliative Care Assessment & Plan   Patient Profile: 66 y.o. male  with past medical history of CAD s/p PCI, Hep C, alcoholic cirrhosis, polysubstance abuse, thrombocytopenia, and esophageal CA s/p PEG tube  placement and subsequent removal admitted on 07/31/2016 with hepatic failure, renal failure.  Palliative consulted for goals of care for hepatic failure.  Recommendations/Plan:  Continue current care and continue to assess progress daily.  He is clear that  he does not want prolonged hospitalization if he is not improving.  While he appears to be stabilizing (from a labwork standpoint at least), he still has no significant gains in his nutrition or functional status to this point.  Constipation: several days since last BM and suppository ordered today.  Would continue senna and lactulose (added by GI yesterday)    Pain: Improved on fentanyl 40mg Q2H as needed.  He would like to receive medication for pain regardless of concern for hypotension or other side effects.  He feels strongly that being comfortable is top priority if he declines.  His sister is his H72    Code Status  DNR  Prognosis:   Unable to determine  Discharge Planning:  To Be Determined.  Recommendation for SNF, but he has been refusing to consider this.  Will continue to discuss with patient and his sister.  Care plan was discussed with patient, RN  Thank you for allowing the Palliative Medicine Team to assist in the care of this patient.    Total Time 20 Prolonged Time Billed No      Greater than 50%  of this time was spent counseling and coordinating care related to the above assessment and plan.  GMicheline Rough MD  Please contact Palliative Medicine Team phone at [phone] questions and concerns.

## 2016-08-09 NOTE — Progress Notes (Signed)
Speech Language Pathology Treatment: Dysphagia  Patient Details Name: Patrick [name] MRN: [id number] DOB: [birth year] Today's Date: 08/09/2016 Time: [id number] SLP Time Calculation (min) (ACUTE ONLY): 15 min  Assessment / Plan / Recommendation Clinical Impression  Pt exhibited a delayed cough with larger volumes of thin liquids, but otherwise, he did not exhibit any overt s/s of aspiration with thin via small sips and puree via 1/2 tsp amounts; skilled observation of only small amounts d/t pt preference and stating "I don't have an appetite for much." Pt prefers Ensure and liquids primarily d/t decreased taste s/p esophageal CA and rad tx associated with this; recommend pt consume Dysphagia 1 (puree)/thin via small sips utilizing esophageal precautions as tolerated; ST will con't to f/u for diet tolerance while in house.   HPI HPI: Patrick [name] a 66 y.o.malewith medical history significant of CAD s/p PCI, Hep C, alcoholic cirrhosis, polysubstance abuse, thrombocytopenia, and esophageal CA s/p PEG tube placement and subsequent removal presenting with "stomach swelling up". Has been happening for over a month now. +n/v. No hematemesis. Decreased PO intake. Had a feeding tube (for h/o esophageal cancer) but it was removed over a month ago. Diarrhea stools for days. 2-4 stools/day. +subjective fever. Has chronic cough. Last drink was before Christmas. Generally drinks 1/5 per day. +tremors. Visual hallucinations. No seizures.  The patient is s/p paracentesis which removed 3 liters of fluid.  The patient reported that he is s/p chemo/radiation to treat esophageal cancer and that he had PEG tube due to issues with intake during his treatment.  He reported he was told it was safe to remove.        SLP Plan  Continue with current plan of care     Recommendations  Diet recommendations: Dysphagia 1 (puree);Thin liquid Liquids provided via: Cup Medication Administration: Crushed with  puree Supervision: Patient able to self feed;Staff to assist with self feeding Compensations: Slow rate;Small sips/bites;Effortful swallow;Follow solids with liquid;Multiple dry swallows after each bite/sip Postural Changes and/or Swallow Maneuvers: Seated upright 90 degrees;Upright 30-60 min after meal                Oral Care Recommendations: Oral care QID Follow up Recommendations: Other (comment) (TBD) Plan: Continue with current plan of care                      ADAMS,PAT, M.S., CCC-SLP 08/09/2016, 5:12 PM

## 2016-08-10 LAB — CBC
HEMATOCRIT: 30.2 % — AB (ref 39.0–52.0)
HEMOGLOBIN: 10.5 g/dL — AB (ref 13.0–17.0)
MCH: 34.3 pg — ABNORMAL HIGH (ref 26.0–34.0)
MCHC: 34.8 g/dL (ref 30.0–36.0)
MCV: 98.7 fL (ref 78.0–100.0)
Platelets: 56 10*3/uL — ABNORMAL LOW (ref 150–400)
RBC: 3.06 MIL/uL — AB (ref 4.22–5.81)
RDW: 22.5 % — ABNORMAL HIGH (ref 11.5–15.5)
WBC: 7.8 10*3/uL (ref 4.0–10.5)

## 2016-08-10 LAB — COMPREHENSIVE METABOLIC PANEL
ALBUMIN: 3.1 g/dL — AB (ref 3.5–5.0)
ALT: 113 U/L — ABNORMAL HIGH (ref 17–63)
AST: 150 U/L — AB (ref 15–41)
Alkaline Phosphatase: 176 U/L — ABNORMAL HIGH (ref 38–126)
Anion gap: 7 (ref 5–15)
BILIRUBIN TOTAL: 2.2 mg/dL — AB (ref 0.3–1.2)
BUN: 46 mg/dL — AB (ref 6–20)
CHLORIDE: 97 mmol/L — AB (ref 101–111)
CO2: 30 mmol/L (ref 22–32)
Calcium: 9.4 mg/dL (ref 8.9–10.3)
Creatinine, Ser: 1.54 mg/dL — ABNORMAL HIGH (ref 0.61–1.24)
GFR calc Af Amer: 53 mL/min — ABNORMAL LOW (ref >60)
GFR calc non Af Amer: 46 mL/min — ABNORMAL LOW (ref >60)
GLUCOSE: 269 mg/dL — AB (ref 65–99)
POTASSIUM: 4.3 mmol/L (ref 3.5–5.1)
Sodium: 134 mmol/L — ABNORMAL LOW (ref 135–145)
TOTAL PROTEIN: 6.8 g/dL (ref 6.5–8.1)

## 2016-08-10 LAB — PROTIME-INR
INR: 1.72
Prothrombin Time: 20.4 seconds — ABNORMAL HIGH (ref 11.4–15.2)

## 2016-08-10 MED ORDER — HYDROMORPHONE HCL 2 MG/ML IJ SOLN: 0.2500 mg | INTRAMUSCULAR | Status: DC | PRN

## 2016-08-10 MED ORDER — METHYLPREDNISOLONE SODIUM SUCC 40 MG IJ SOLR
40.0000 mg | Freq: Two times a day (BID) | INTRAMUSCULAR | Status: DC
  Administered 2016-08-10 – 2016-08-11 (×2): 40 mg via INTRAVENOUS
  Filled 2016-08-10 (×2): qty 1

## 2016-08-10 MED ORDER — ALPRAZOLAM 0.5 MG PO TABS
0.5000 mg | ORAL_TABLET | Freq: Three times a day (TID) | ORAL | Status: DC | PRN
  Administered 2016-08-10 – 2016-08-12 (×7): 0.5 mg via ORAL
  Filled 2016-08-10 (×7): qty 1

## 2016-08-10 NOTE — Progress Notes (Signed)
Physical Therapy Treatment Patient Details Name: Dyson Ornellas MRN: [id number] DOB: 04-10-1951 Today's Date: 08/10/2016    History of Present Illness  66 y.o. male with medical history significant of CAD s/p PCI, Hep C, alcoholic cirrhosis, polysubstance abuse, thrombocytopenia, and esophageal CA s/p PEG tube placement and subsequent removal; adm 07/31/16 with abdominal distention    PT Comments    Progressing with mobility. Pt continues to require +2 assist for safe mobility. He presents with weakness, decreased activity tolerance, and impaired gait and balance. Pt is at risk for falls. Discussed d/c plan-pt continues to refuse placement. Recommend 24 hour care/HHPT if he returns home. Do not feel pt is safe to d/c home alone at this time.    Follow Up Recommendations  SNF (pt refusing SNF presently. Will need 24 hour supervision/assist if he returns home.)     Equipment Recommendations  None recommended by PT    Recommendations for Other Services       Precautions / Restrictions Precautions Precautions: Fall Restrictions Weight Bearing Restrictions: No    Mobility  Bed Mobility Overal bed mobility: Needs Assistance Bed Mobility: Supine to Sit     Supine to sit: Min guard;HOB elevated     General bed mobility comments: close guard for safety.   Transfers Overall transfer level: Needs assistance Equipment used: Rolling walker (2 wheeled) Transfers: Sit to/from Stand Sit to Stand: Min assist;+2 physical assistance;+2 safety/equipment;From elevated surface         General transfer comment: Assist to rise, stabilize, control descent. VCs safety, hand placement  Ambulation/Gait Ambulation/Gait assistance: Min assist;+2 physical assistance;+2 safety/equipment Ambulation Distance (Feet): 100 Feet Assistive device: Rolling walker (2 wheeled) Gait Pattern/deviations: Narrow base of support;Decreased step length - right;Decreased step length - left;Step-through pattern      General Gait Details: Pt remains unsteady. LOB x2 during ambulation. Fatigues fairly easily. Assist to stabilize pt and maneuver safely with walker.    Stairs            Wheelchair Mobility    Modified Rankin (Stroke Patients Only)       Balance Overall balance assessment: Needs assistance           Standing balance-Leahy Scale: Poor                      Cognition Arousal/Alertness: Awake/alert Behavior During Therapy: WFL for tasks assessed/performed Overall Cognitive Status: Within Functional Limits for tasks assessed                      Exercises      General Comments        Pertinent Vitals/Pain Pain Assessment: Faces Faces Pain Scale: Hurts even more Pain Location: bil feet Pain Descriptors / Indicators: Sore Pain Intervention(s): Monitored during session;Repositioned    Home Living                      Prior Function            PT Goals (current goals can now be found in the care plan section) Progress towards PT goals: Progressing toward goals    Frequency    Min 3X/week      PT Plan Current plan remains appropriate    Co-evaluation             End of Session Equipment Utilized During Treatment: Gait belt Activity Tolerance: Patient tolerated treatment well Patient left: in chair;with call bell/phone within reach;with chair alarm  set     Time: [id number] PT Time Calculation (min) (ACUTE ONLY): 23 min  Charges:  $Gait Training: 8-22 mins                    G Codes:      Weston Anna, MPT Pager: [phone]

## 2016-08-10 NOTE — Progress Notes (Signed)
[facility]  PROGRESS NOTE  Patrick [name] [id number] DOB: November 08, 1950 DOA: 07/31/2016 PCP: Javier Docker, MD   Brief HPI:   *66 y.o.malewith medical history significant of CAD s/p PCI, Hep C, alcoholic cirrhosis, polysubstance abuse, and esophageal CA s/p chemo and XRT  in remission presenting with increasing abdominal distention. He endorsed decreased po intake, had been drinking alcohol, on average about a fifth per day. 3L of transudative ascitic fluid was removed with subsequently negative cultures. Acute kidney injury was noted on admission. Urinalysis unremarkable, CT abdomen showed atrophic right kidney and nonobstructing left renal stone. After marginal improvement in creatinine (1.93 > 1.40), renal function worsened with concern for hepatorenal syndrome and albumin was given on 01/03. On admission he had heme positive stool and has been given vitamin K for elevated INR on admission, most recently down to 1.9. On 01/03 he became hypotensive with a drop in hemoglobin (12.3 >> 7.7) and was transfused 1u PRBCs. Previous EGD in March 2017 by Dr. Carlean Purl showed no varices and negative biopsy. GI is asked to consult.   Had EGD yesterday which showed a significant stenosis which was dilated, no significant source for bleeding. Also underwent paracentesis yesterday, which yielded 1.6 L of slightly hazy yellow fluid.   Subjective   Patient seen and examined, complains of being anxious.   Assessment/Plan:     1. Acute liver failure with ascites- secondary to chronic alcoholic cirrhosis, Child-Pugh score 12 (Class C), INR 2.10 at admission. 3L transudative paracentesis 12/29 without evidence of SBP. Ammonia 41, started on rifaximin, repeat paracentesis yielded 1.6 L of hazy yellow fluid. Palliative care consulted. Patient was empirically started on Solu-Medrol for hypertension as stress dose steroids, on 08/06/2016. We'll change Solu-Medrol to 40 mg every 12 hours, and taper off   next 1 days. 2. Status post EGD/upper GI bleed- patient had heme-positive stools in the ED without gross GI bleeding, also had elevated INR and thrombocytopenia . Hemoglobin dropped to 7.7 on 08/06/2016, which required 1 unit PRBC and platelet transfusion with improvement of hemoglobin to 10.1. Patient underwent EGD yesterday which showed esophageal stricture post XRT which was dilated. Small less than 5 mm esophageal varices were found. Diet advance as tolerated. Patient to follow-up with GI as outpatient in 2 weeks. 3. Acute kidney injury- likely from ascites, liver disease, dehydration. Renal function back to baseline. Today creatinine is 1.64. 4. Anxiety- start on  Xanax 0.5 mg po TID prn. 5. Severe malnutrition- status post PEG tube placement due to dysphagia for treatment of esophageal carcinoma. Patient now started on soft diet, will get swallow eval. 6. Constipation- no improvement with Senokot tablets, will give Dulcolax suppository 1 today. 7. 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 Continue gabapentin, fentanyl. 8. 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 42,000. Follow CBC in am. 9. Tobacco dependence- nicotine patch     DVT prophylaxis: SCDs, no Lovenox due to thrombocytopenia.  Code Status: DO NOT RESUSCITATE  Family Communication: No family present at bedside   Disposition Plan: Likely skilled nursing facility due to multiple medical problems,PT recommended SNF but patient declines. Wants to discuss with family.  palliative care following.   Consultants:  GI  Palliative care  CCM  Procedures:  Ultrasound-guided paracentesis  EGD  Continuous infusions     Antibiotics:   Anti-infectives    Start     Dose/Rate Route Frequency Ordered Stop   08/08/16 1700  rifaximin (XIFAXAN) tablet 550 mg     550 mg Oral 2 times  daily 08/08/16 1617     08/05/16 1600  rifaximin (XIFAXAN) tablet 400 mg  Status:  Discontinued     400 mg Oral  Every 8 hours 08/05/16 1519 08/08/16 1514   08/05/16 1500  rifaximin (XIFAXAN) tablet 550 mg  Status:  Discontinued     550 mg Oral Every 8 hours 08/05/16 1447 08/05/16 1519   08/02/16 1500  rifaximin (XIFAXAN) tablet 550 mg  Status:  Discontinued     550 mg Oral 2 times daily 08/02/16 1335 08/05/16 1447       Objective   Vitals:   08/09/16 2019 08/10/16 0440 08/10/16 0451 08/10/16 1344  BP: 119/86 119/73  121/80  Pulse: 92 73  75  Resp: 18 18  18   Temp: 98.2 F (36.8 C) 97.7 F (36.5 C)  97.9 F (36.6 C)  TempSrc: Oral Oral  Oral  SpO2: 97% 98%  98%  Weight:   85.1 kg (187 lb 9.8 oz)   Height:        Intake/Output Summary (Last 24 hours) at 08/10/16 1355 Last data filed at 08/10/16 1300  Gross per 24 hour  Intake             1440 ml  Output             1060 ml  Net              380 ml   Filed Weights   08/08/16 1314 08/09/16 0500 08/10/16 0451  Weight: 81.6 kg (180 lb) 80.4 kg (177 lb 4 oz) 85.1 kg (187 lb 9.8 oz)     Physical Examination:  General exam: Appears calm and comfortable. Respiratory system: Clear to auscultation. Respiratory effort normal. Cardiovascular system:  RRR. No  murmurs, rubs, gallops. No pedal edema. GI system: Abdomen is nondistended, soft and nontender. No organomegaly.  Central nervous system. No focal neurological deficits. 5 x 5 power in all extremities. Skin: No rashes, lesions or ulcers. Psychiatry: Alert, oriented x 3.Judgement and insight appear normal. Affect normal.    Data Reviewed: I have personally reviewed following labs and imaging studies  CBG:  Recent Labs Lab 08/04/16 0740 08/04/16 1217  GLUCAP 132* 162*    CBC:  Recent Labs Lab 08/06/16 0525  08/07/16 1403 08/07/16 2052 08/08/16 0354 08/09/16 0306 08/10/16 0417  WBC 3.4*  < > 5.3 6.6 7.2 6.5 7.8  NEUTROABS 1.8  --   --   --   --   --   --   HGB 9.3*  < > 9.6* 9.9* 9.6* 9.9* 10.5*  HCT 25.0*  < > 27.1* 26.3* 25.7* 27.2* 30.2*  MCV 100.4*  < > 97.8  96.3 97.0 97.5 98.7  PLT 43*  < > 53* 89* 53* 58* 56*  < > = values in this interval not displayed.  Basic Metabolic Panel:  Recent Labs Lab 08/06/16 1524 08/07/16 1403 08/08/16 0354 08/09/16 0306 08/10/16 0417  NA 134* 132* 137 134* 134*  K 3.9 4.3 4.7 4.3 4.3  CL 98* 98* 100* 99* 97*  CO2 28 25 28 27 30   GLUCOSE 163* 322* 250* 285* 269*  BUN 37* 36* 38* 44* 46*  CREATININE 1.92* 1.82* 1.69* 1.64* 1.54*  CALCIUM 8.9 8.9 9.1 9.0 9.4    Recent Results (from the past 240 hour(s))  Culture, body fluid-bottle     Status: None   Collection Time: 08/01/16 10:38 AM  Result Value Ref Range Status   Specimen Description FLUID PERITONEAL  Final  Special Requests NONE  Final   Culture   Final    NO GROWTH 5 DAYS Performed at [facility]    Report Status 08/06/2016 FINAL  Final  Gram stain     Status: None   Collection Time: 08/01/16 10:38 AM  Result Value Ref Range Status   Specimen Description FLUID PERITONEAL  Final   Special Requests NONE  Final   Gram Stain   Final    RARE WBC PRESENT,BOTH PMN AND MONONUCLEAR NO ORGANISMS SEEN Performed at [facility]    Report Status 08/01/2016 FINAL  Final  MRSA PCR Screening     Status: None   Collection Time: 08/06/16  5:00 PM  Result Value Ref Range Status   MRSA by PCR NEGATIVE NEGATIVE Final    Comment:        The GeneXpert MRSA Assay (FDA approved for NASAL specimens only), is one component of a comprehensive MRSA colonization surveillance program. It is not intended to diagnose MRSA infection nor to guide or monitor treatment for MRSA infections.      Liver Function Tests:  Recent Labs Lab 08/06/16 0525 08/07/16 1403 08/08/16 0354 08/09/16 0306 08/10/16 0417  AST 253* 217* 187* 171* 150*  ALT 93* 86* 85* 97* 113*  ALKPHOS 132* 122 126 125 176*  BILITOT 2.0* 2.5* 2.1* 2.2* 2.2*  PROT 5.9* 6.5 5.9* 6.2* 6.8  ALBUMIN 1.8* 3.3* 3.0* 3.0* 3.1*        Studies: US Paracentesis  Result  Date: 08/08/2016 INDICATION: Cirrhosis, hepatitis-C, esophageal cancer, recurrent ascites. Request made for therapeutic paracentesis. EXAM: ULTRASOUND GUIDED THERAPEUTIC PARACENTESIS MEDICATIONS: None. COMPLICATIONS: None immediate. PROCEDURE: Informed written consent was obtained from the patient after a discussion of the risks, benefits and alternatives to treatment. A timeout was performed prior to the initiation of the procedure. Initial ultrasound scanning demonstrates a small amount of ascites within the right mid to lower abdominal quadrant. The right mid to lower abdomen was prepped and draped in the usual sterile fashion. 1% lidocaine was used for local anesthesia. Following this, a Yueh catheter was introduced. An ultrasound image was saved for documentation purposes. The paracentesis was performed. The catheter was removed and a dressing was applied. The patient tolerated the procedure well without immediate post procedural complication. FINDINGS: A total of approximately 1.6 liters of slightly hazy, yellow fluid was removed. IMPRESSION: Successful ultrasound-guided therapeutic paracentesis yielding 1.6 liters of peritoneal fluid. Read by: Rowe Robert, PA-C Electronically Signed   By: Aletta Edouard M.D.   On: 08/08/2016 17:02    Scheduled Meds: . chlorhexidine  15 mL Mouth Rinse BID  . feeding supplement (ENSURE ENLIVE)  237 mL Oral 5 X Daily  . folic acid  1 mg Oral Daily  . lactulose  10 g Oral Daily  . mouth rinse  15 mL Mouth Rinse q12n4p  . methylPREDNISolone (SOLU-MEDROL) injection  40 mg Intravenous Q12H  . metoprolol tartrate  12.5 mg Oral BID  . multivitamin with minerals  1 tablet Oral Daily  . nicotine  21 mg Transdermal Daily  . rifaximin  550 mg Oral BID  . sodium chloride flush  3 mL Intravenous Q12H  . thiamine  100 mg Oral Daily   Or  . thiamine  100 mg Intravenous Daily      Time spent: 25 min  [facility] Pager [phone]. If 7PM-7AM,  please contact night-coverage at [email or web address], Office  [phone]  password [email or web address] 08/10/2016, 1:55 PM  LOS:  10 days

## 2016-08-10 NOTE — Progress Notes (Signed)
Daily Progress Note   Patient Name: Patrick [name]       Date: 08/10/2016 DOB: 10-26-1950  Age: 66 y.o. MRN#: [id number] Attending Physician: Oswald Hillock, MD Primary Care Physician: Javier Docker, MD Admit Date: 07/31/2016  Reason for Consultation/Follow-up: Establishing goals of care, Non pain symptom management and Pain control  Subjective: I met with Mr. Lienhard today with several members of his family.  His sister also stopped me outside of his room to discuss long term care plan.  See below.  Length of Stay: 10  Current Medications: Scheduled Meds:  . chlorhexidine  15 mL Mouth Rinse BID  . feeding supplement (ENSURE ENLIVE)  237 mL Oral 5 X Daily  . folic acid  1 mg Oral Daily  . lactulose  10 g Oral Daily  . mouth rinse  15 mL Mouth Rinse q12n4p  . methylPREDNISolone (SOLU-MEDROL) injection  40 mg Intravenous Q12H  . metoprolol tartrate  12.5 mg Oral BID  . multivitamin with minerals  1 tablet Oral Daily  . nicotine  21 mg Transdermal Daily  . rifaximin  550 mg Oral BID  . sodium chloride flush  3 mL Intravenous Q12H  . thiamine  100 mg Oral Daily   Or  . thiamine  100 mg Intravenous Daily    Continuous Infusions:   PRN Meds: ALPRAZolam, fentaNYL (SUBLIMAZE) injection, ondansetron **OR** ondansetron (ZOFRAN) IV, oxyCODONE, senna  Physical Exam   General: Ill-appearing male, slow to answer questions, but answers appropriately, no acute distress.  HEENT: No bruits, no goiter, no JVD Heart: Regular rate and rhythm. No murmur appreciated. Lungs: Good air movement, clear Abdomen: Soft, nontender, distended, positive bowel sounds.  Ext: + LE edema Skin: Warm and dry        Vital Signs: BP 121/80 (BP Location: Left Arm)   Pulse 75   Temp 97.9 F (36.6 C) (Oral)    Resp 18   Ht _0  (1.753 m)   Wt 85.1 kg (187 lb 9.8 oz)   SpO2 98%   BMI 27.71 kg/m  SpO2: SpO2: 98 % O2 Device: O2 Device: Nasal Cannula O2 Flow Rate: O2 Flow Rate (L/min): 2 L/min  Intake/output summary:   Intake/Output Summary (Last 24 hours) at 08/10/16 1452 Last data filed at 08/10/16 1300  Gross per 24 hour  Intake             1200 ml  Output             1060 ml  Net              140 ml   LBM: Last BM Date: 07/31/17 Baseline Weight: Weight: 78.9 kg (173 lb 15.1 oz) Most recent weight: Weight: 85.1 kg (187 lb 9.8 oz)       Palliative Assessment/Data:    Flowsheet Rows   Flowsheet Row Most Recent Value  Intake Tab  Referral Department  Hospitalist  Unit at Time of Referral  Med/Surg Unit  Palliative Care Primary Diagnosis  Other (Comment) [Hepatic]  Date Notified  08/05/16  Palliative Care Type  New Palliative care  Reason for referral  Clarify Goals of Care  Date of Admission  07/31/16  Date first seen by Palliative Care  08/05/16  # of days Palliative referral response time  0 Day(s)  # of days IP prior to Palliative referral  5  Clinical Assessment  Palliative Performance Scale Score  50%  Pain Max last 24 hours  7  Pain Min Last 24 hours  0  Psychosocial & Spiritual Assessment  Palliative Care Outcomes  Patient/Family meeting held?  Yes  Who was at the meeting?  Patient  Palliative Care Outcomes  Provided advance care planning, ACP counseling assistance      Patient Active Problem List   Diagnosis Date Noted  . Esophageal stricture   . Secondary esophageal varices without bleeding ([place])   . Portal hypertensive gastropathy   . Heme + stool   . Decreased hemoglobin   . Palliative care encounter   . Goals of care, counseling/discussion   . Acute liver failure 08/01/2016  . Ascites 08/01/2016  . Acute renal failure ([place]) 08/01/2016  . Melena 08/01/2016  . Protein-calorie malnutrition, severe ([place]) 08/01/2016  . Alcohol withdrawal ([place])  08/01/2016  . 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 Alcoholic peripheral neuropathy ([place]) 10/19/2015  . Chemotherapy-induced neuropathy ([place]) 10/19/2015  . Constipation 08/03/2015  . Cancer associated pain 08/03/2015  . Nausea with vomiting 08/03/2015  . Acute confusion   . Esophageal dysphagia 06/26/2015  . Acute encephalopathy 06/26/2015  . 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 Alcohol-induced acute pancreatitis   . Abnormal CT scan, esophagus   . Abnormal weight loss   . Dysphagia, pharyngoesophageal phase   . Fall 04/26/2015  . Numbness 04/26/2015  . Protein-calorie malnutrition, moderate ([place]) 04/26/2015  . Weakness 04/26/2015  . Abdominal pain 04/26/2015  . Pancreatitis 04/26/2015  . Alcohol abuse   . Dysphagia   . Prolonged QT interval   . Essential hypertension   . Polysubstance abuse 09/03/2012  . Leukopenia and Thrombocytopenia 01/02/2012  . Chronic hepatitis C with cirrhosis ([place]) 01/02/2012  . Alcoholism ([place]) 01/02/2012  . Barrett's esophagus 11/17/2011  . History of colonic polyps 11/11/2011  . Thrombocytopenia ([place]) 03/03/2011  . Chest pain 12/03/2010  . CAD (coronary artery disease) 12/03/2010  . Hyperlipidemia 12/03/2010  . Tobacco abuse 12/03/2010  . Cocaine abuse 12/03/2010    Palliative Care Assessment & Plan   Patient Profile: 66 y.o. male  with past medical history of CAD s/p PCI, Hep C, alcoholic cirrhosis, polysubstance abuse, thrombocytopenia, and esophageal CA s/p PEG tube placement and subsequent removal admitted on 07/31/2016 with hepatic failure, renal failure.  Palliative  consulted for goals of care for hepatic failure.  Recommendations/Plan:  Continue current care and continue to assess progress daily.    I discussed with his sister outside the room regarding options moving forward.  She understands his situation, but  reports that he "is going to do what he wants to do no matter what we recommend."  She is in agreement that a good plan would be to plan to go to SNF as has been recommended. He has done well with rehabbing in the past. If he does well and continues to thrive, I encouraged they continue with this plan. If, however, he is unable to regain function and he continues to decline, I recommended that he speak with his GI physician/PCP to determine if he may be better served by focusing his care on staying at home with support of organization such as hospice.  I discussed this plan with patient as well.  He feels that he is going to improve and be able to live independently moving forward.  He was surprised to discuss anything other than continuing to improve despite telling me 2 days ago that he thought he was going to die this hospitalization.  I would recommend palliative care f/u at SNF.  Constipation:  Would continue senna and lactulose   Pain: Improved on fentanyl 43mg Q2H as needed.  He would like to receive medication for pain regardless of concern for hypotension or other side effects.  He feels strongly that being comfortable is top priority if he declines.    His sister is his H55    Code Status  DNR  Prognosis:   Unable to determine  Discharge Planning:  To Be Determined.  Recommendation for SNF, but he had been refusing to consider this up until today.  He states today that he does not want to go to SNF, but realizes that he probably needs to go.  His family has been talking to him as well.  He has told them that his is agreeable to SNF for "a little while."   Care plan was discussed with patient, RN  Thank you for allowing the Palliative Medicine Team to assist in the care of this patient.    Total Time 40 Prolonged Time Billed No      Greater than 50%  of this time was spent counseling and coordinating care related to the above assessment and plan.  GMicheline Rough  MD  Please contact Palliative Medicine Team phone at [phone] questions and concerns.

## 2016-08-11 ENCOUNTER — Encounter (HOSPITAL_COMMUNITY): Payer: Self-pay | Admitting: Internal Medicine

## 2016-08-11 LAB — PROTIME-INR
INR: 1.82
Prothrombin Time: 21.3 seconds — ABNORMAL HIGH (ref 11.4–15.2)

## 2016-08-11 LAB — COMPREHENSIVE METABOLIC PANEL
ALT: 126 U/L — ABNORMAL HIGH (ref 17–63)
AST: 139 U/L — ABNORMAL HIGH (ref 15–41)
Albumin: 2.6 g/dL — ABNORMAL LOW (ref 3.5–5.0)
Alkaline Phosphatase: 200 U/L — ABNORMAL HIGH (ref 38–126)
Anion gap: 7 (ref 5–15)
BUN: 46 mg/dL — ABNORMAL HIGH (ref 6–20)
CO2: 29 mmol/L (ref 22–32)
Calcium: 9.2 mg/dL (ref 8.9–10.3)
Chloride: 96 mmol/L — ABNORMAL LOW (ref 101–111)
Creatinine, Ser: 1.19 mg/dL (ref 0.61–1.24)
GFR calc Af Amer: >60 mL/min (ref >60)
GFR calc non Af Amer: >60 mL/min (ref >60)
Glucose, Bld: 281 mg/dL — ABNORMAL HIGH (ref 65–99)
Potassium: 4.4 mmol/L (ref 3.5–5.1)
Sodium: 132 mmol/L — ABNORMAL LOW (ref 135–145)
Total Bilirubin: 2.3 mg/dL — ABNORMAL HIGH (ref 0.3–1.2)
Total Protein: 6.4 g/dL — ABNORMAL LOW (ref 6.5–8.1)

## 2016-08-11 LAB — CBC
HEMATOCRIT: 30.4 % — AB (ref 39.0–52.0)
HEMOGLOBIN: 11 g/dL — AB (ref 13.0–17.0)
MCH: 35.4 pg — ABNORMAL HIGH (ref 26.0–34.0)
MCHC: 36.2 g/dL — AB (ref 30.0–36.0)
MCV: 97.7 fL (ref 78.0–100.0)
Platelets: 52 10*3/uL — ABNORMAL LOW (ref 150–400)
RBC: 3.11 MIL/uL — AB (ref 4.22–5.81)
RDW: 22.2 % — ABNORMAL HIGH (ref 11.5–15.5)
WBC: 8.1 10*3/uL (ref 4.0–10.5)

## 2016-08-11 MED ORDER — SENNA 8.6 MG PO TABS
1.0000 | ORAL_TABLET | Freq: Two times a day (BID) | ORAL | Status: DC
  Administered 2016-08-11 – 2016-08-13 (×4): 8.6 mg via ORAL
  Filled 2016-08-11 (×4): qty 1

## 2016-08-11 NOTE — Care Management Important Message (Signed)
Important Message  Patient Details  Name: Patrick [name] MRN: [id number] Date of Birth: 08-Jan-1951   Medicare Important Message Given:  Yes    Kerin Salen 08/11/2016, 2:45 [place] Message  Patient Details  Name: Patrick [name] MRN: [id number] Date of Birth: 08-21-1950   Medicare Important Message Given:  Yes    Kerin Salen 08/11/2016, 2:45 PM

## 2016-08-11 NOTE — Progress Notes (Signed)
Physical Therapy Treatment Patient Details Name: Patrick [name] MRN: [id number] DOB: Apr 09, 1951 Today's Date: 08/11/2016    History of Present Illness  66 y.o. male with medical history significant of CAD s/p PCI, Hep C, alcoholic cirrhosis, polysubstance abuse, thrombocytopenia, and esophageal CA s/p PEG tube placement and subsequent removal; adm 07/31/16 with abdominal distention    PT Comments    The patient was not cooperative in mobility today. He reports  That he is trying to contact someone about getting out of the hospital.   Follow Up Recommendations  SNF     Equipment Recommendations  None recommended by PT    Recommendations for Other Services       Precautions / Restrictions Precautions Precautions: Fall Precaution Comments: AMS    Mobility  Bed Mobility Overal bed mobility: Needs Assistance Bed Mobility: Supine to Sit           General bed mobility comments: attempted and strongly encouraged the patiwent to sit up to thwe bed edge. the patinet would not go farther than the left leg over the edge. became more agitated the more encouragement was offered. Did not assist further with maobility attempts.  Transfers                    Ambulation/Gait                 Stairs            Wheelchair Mobility    Modified Rankin (Stroke Patients Only)       Balance                                    Cognition Arousal/Alertness: Awake/alert   Overall Cognitive Status: No family/caregiver present to determine baseline cognitive functioning               Problem Solving: Slow processing;Decreased initiation;Difficulty sequencing;Requires verbal cues;Requires tactile cues General Comments: The  patient states that he needs a phone number, does not specify whose. states that the hospital is not letting him leave.    Exercises      General Comments        Pertinent Vitals/Pain Faces Pain Scale: Hurts even  more Pain Location: bil feet Pain Descriptors / Indicators: Sore;Discomfort Pain Intervention(s): Monitored during session    Home Living                      Prior Function            PT Goals (current goals can now be found in the care plan section) Progress towards PT goals: Progressing toward goals    Frequency           PT Plan Current plan remains appropriate    Co-evaluation             End of Session   Activity Tolerance: Treatment limited secondary to agitation Patient left: in bed;with call bell/phone within reach;with bed alarm set     Time: [id number] PT Time Calculation (min) (ACUTE ONLY): 16 min  Charges:  $Therapeutic Activity: 8-22 mins                    G Codes:      Claretha Cooper 08/11/2016, 12:12 PM Tresa Endo PT [phone]

## 2016-08-11 NOTE — Progress Notes (Signed)
Nutrition Follow-up  DOCUMENTATION CODES:   Severe malnutrition in context of chronic illness  INTERVENTION:   -Continue Ensure Enlive po 5x daily, each supplement provides 350 kcal and 20 grams of protein -Continue Magic cup TID with meals, each supplement provides 290 kcal and 9 grams of protein -RD will continue to monitor for needs  NUTRITION DIAGNOSIS:   Malnutrition related to chronic illness as evidenced by severe depletion of body fat, severe depletion of muscle mass.  Ongoing.  GOAL:   Patient will meet greater than or equal to 90% of their needs  Progressing.  MONITOR:   PO intake, Supplement acceptance, Weight trends, Labs, I & O's, Other (Comment) (GOC)  ASSESSMENT:   66 y.o. male with medical history significant of CAD s/p PCI, Hep C, alcoholic cirrhosis, polysubstance abuse, thrombocytopenia, and esophageal CA s/p PEG tube placement and subsequent removal presenting with "stomach swelling up".  Has been happening for over a month now.  +n/v.  No hematemesis.  Decreased PO intake.  Had a feeding tube (for h/o esophageal cancer) but it was removed over a month ago.  Diarrhea stools for days.  2-4 stools/day.  +subjective fever.   Patient now on dysphagia 1 diet with thin liquids. Pt eating 20-100% per chart review. Pt is drinking the supplements provided. Per palliative care note 1/7, plan is for pt to discharge to SNF. Will continue current interventions placed.   Medications: Folic acid tablet daily, Lactulose solution daily, Multivitamin with minerals daily, Thiamine tablet daily Labs reviewed: Low Na Glucose: 281  Diet Order:  DIET - DYS 1 Room service appropriate? Yes; Fluid consistency: Thin  Skin:  Reviewed, no issues  Last BM:  1/6  Height:   Ht Readings from Last 1 Encounters:  08/08/16 5\' 9"  (1.753 m)    Weight:   Wt Readings from Last 1 Encounters:  08/11/16 187 lb 13.3 oz (85.2 kg)    Ideal Body Weight:  67.2 kg  BMI:  Body mass index  is 27.74 kg/m.  Estimated Nutritional Needs:   Kcal:  2000-2300kcal/day   Protein:  94-108g/day   Fluid:  2L/day   EDUCATION NEEDS:   No education needs identified at this time  Clayton Bibles, MS, RD, LDN Pager: [phone] After Hours Pager: [phone]

## 2016-08-11 NOTE — Progress Notes (Signed)
Pt / family have chosen [facility] for SNF placement. SNF request authorization from [facility]. CSW will continue to follow to assist with d/c planning needs.  Werner Lean LCSW [phone]

## 2016-08-11 NOTE — Progress Notes (Signed)
CSW met with pt at bedside this am to assist with d/c planning. Pt reports that he is interested in rehab placement. CSW has initiated SNF search and will provide bed offers this afternoon. Pt has [employer] which requires prior authorization. CSW will assist with this process.  Werner Lean LCSW [phone]

## 2016-08-11 NOTE — Progress Notes (Signed)
   08/11/16 1000  Clinical Encounter Type  Visited With Patient  Visit Type Follow-up;Psychological support;Spiritual support  Consult/Referral To Chaplain  Spiritual Encounters  Spiritual Needs Emotional;Other (Comment) (Pastoral conversation/Support)  Stress Factors  Patient Stress Factors Family relationships;Other (Comment) (Visit from family)   I followed up with the patient from a previous visit the week before.  Patient was drowsy, but remembered me from the week before.  He did not state any needs at this time. Mr. Arndt was looking forward to seeing his family.   Please, contact Spiritual Care for further assistance.   [place] M.Div.

## 2016-08-11 NOTE — Progress Notes (Signed)
Inpatient Diabetes Program Recommendations  AACE/ADA: New Consensus Statement on Inpatient Glycemic Control (2015)  Target Ranges:  Prepandial:   less than 140 mg/dL      Peak postprandial:   less than 180 mg/dL (1-2 hours)      Critically ill patients:  140 - 180 mg/dL   Lab Results  Component Value Date   GLUCAP 162 (H) 08/04/2016   HGBA1C 5.4 03/02/2015    Review of Glycemic Control  Steroid-induced hyperglycemia. No hx DM.  Inpatient Diabetes Program Recommendations:    If appropriate, add Novolog sensitive tidwc.  Will continue to follow.  Thank you. Lorenda Peck, RD, LDN, CDE Inpatient Diabetes Coordinator [phone]

## 2016-08-11 NOTE — NC FL2 (Signed)
[place] LEVEL OF CARE SCREENING TOOL     IDENTIFICATION  Patient Name: Patrick [name] Birthdate: 1951/02/26 Sex: male Admission Date (Current Location): 07/31/2016  [facility] and [place] Number:  Herbalist and Address:  [facility],  [facility] [address], [place]      Provider Number: [id number]  Attending Physician Name and Address:  Patrecia Pour, MD  Relative Name and Phone Number:       Current Level of Care: Hospital Recommended Level of Care: [place] Prior Approval Number:    Date Approved/Denied:   PASRR Number:   [id number]   Discharge Plan: SNF    Current Diagnoses: Patient Active Problem List   Diagnosis Date Noted  . Esophageal stricture   . Secondary esophageal varices without bleeding ([place])   . Portal hypertensive gastropathy   . Heme + stool   . Decreased hemoglobin   . Palliative care encounter   . Goals of care, counseling/discussion   . Acute liver failure 08/01/2016  . Ascites 08/01/2016  . Acute renal failure ([place]) 08/01/2016  . Melena 08/01/2016  . Protein-calorie malnutrition, severe ([place]) 08/01/2016  . Alcohol withdrawal ([place]) 08/01/2016  . 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 Alcoholic peripheral neuropathy ([place]) 10/19/2015  . Chemotherapy-induced neuropathy ([place]) 10/19/2015  . Constipation 08/03/2015  . Cancer associated pain 08/03/2015  . Nausea with vomiting 08/03/2015  . Acute confusion   . Esophageal dysphagia 06/26/2015  . Acute encephalopathy 06/26/2015  . 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 Cancer of thoracic esophagus ([place]) 05/11/2015  . Alcohol-induced acute pancreatitis   . Abnormal CT scan, esophagus   . Abnormal weight loss   . Dysphagia, pharyngoesophageal phase   . Fall 04/26/2015  . Numbness 04/26/2015  . Protein-calorie malnutrition, moderate ([place]) 04/26/2015  . Weakness 04/26/2015   . Abdominal pain 04/26/2015  . Pancreatitis 04/26/2015  . Alcohol abuse   . Dysphagia   . Prolonged QT interval   . Essential hypertension   . Polysubstance abuse 09/03/2012  . Leukopenia and Thrombocytopenia 01/02/2012  . Chronic hepatitis C with cirrhosis ([place]) 01/02/2012  . Alcoholism ([place]) 01/02/2012  . Barrett's esophagus 11/17/2011  . History of colonic polyps 11/11/2011  . Thrombocytopenia ([place]) 03/03/2011  . Chest pain 12/03/2010  . CAD (coronary artery disease) 12/03/2010  . Hyperlipidemia 12/03/2010  . Tobacco abuse 12/03/2010  . Cocaine abuse 12/03/2010    Orientation RESPIRATION BLADDER Height & Weight     Place, Self  O2 (2L) Continent Weight: 187 lb 13.3 oz (85.2 kg) Height:  5\' 9"  (175.3 cm)  BEHAVIORAL SYMPTOMS/MOOD NEUROLOGICAL BOWEL NUTRITION STATUS      Continent Diet (DYS 1)  AMBULATORY STATUS COMMUNICATION OF NEEDS Skin   Limited Assist Verbally Normal                       Personal Care Assistance Level of Assistance  Bathing, Feeding, Dressing Bathing Assistance: Limited assistance Feeding assistance: Independent Dressing Assistance: Limited assistance     Functional Limitations Info             SPECIAL CARE FACTORS FREQUENCY  PT (By licensed PT), OT (By licensed OT)     PT Frequency: 5 OT Frequency: 5            Contractures      Additional Factors Info  Code Status, Allergies Code Status Info: DNR Allergies Info: NKA  Current Medications (08/11/2016):  This is the current hospital active medication list Current Facility-Administered Medications  Medication Dose Route Frequency Provider Last Rate Last Dose  . ALPRAZolam Duanne Moron) tablet 0.5 mg  0.5 mg Oral TID PRN Oswald Hillock, MD   0.5 mg at 08/10/16 2224  . chlorhexidine (PERIDEX) 0.12 % solution 15 mL  15 mL Mouth Rinse BID Karmen Bongo, MD   15 mL at 08/11/16 1000  . feeding supplement (ENSURE ENLIVE) (ENSURE ENLIVE) liquid 237 mL  237 mL Oral 5 X Daily  Patrecia Pour, MD   237 mL at 08/11/16 1000  . fentaNYL (SUBLIMAZE) injection 25 mcg  25 mcg Intravenous Q2H PRN Micheline Rough, MD   25 mcg at 08/09/16 2341  . folic acid (FOLVITE) tablet 1 mg  1 mg Oral Daily Karmen Bongo, MD   1 mg at 08/11/16 1000  . lactulose (CHRONULAC) 10 GM/15ML solution 10 g  10 g Oral Daily Gatha Mayer, MD   10 g at 08/11/16 1000  . MEDLINE mouth rinse  15 mL Mouth Rinse q12n4p Karmen Bongo, MD   15 mL at 08/10/16 1600  . methylPREDNISolone sodium succinate (SOLU-MEDROL) 40 mg/mL injection 40 mg  40 mg Intravenous Q12H Oswald Hillock, MD   40 mg at 08/11/16 1000  . metoprolol tartrate (LOPRESSOR) tablet 12.5 mg  12.5 mg Oral BID Rigoberto Noel, MD   12.5 mg at 08/11/16 1000  . multivitamin with minerals tablet 1 tablet  1 tablet Oral Daily Karmen Bongo, MD   1 tablet at 08/11/16 1000  . nicotine (NICODERM CQ - dosed in mg/24 hours) patch 21 mg  21 mg Transdermal Daily Karmen Bongo, MD   21 mg at 08/11/16 1000  . ondansetron (ZOFRAN) tablet 4 mg  4 mg Oral Q6H PRN Karmen Bongo, MD   4 mg at 08/05/16 [id number]   Or  . ondansetron [facility]) injection 4 mg  4 mg Intravenous Q6H PRN Karmen Bongo, MD      . oxyCODONE (Oxy IR/ROXICODONE) immediate release tablet 10 mg  10 mg Oral Q12H PRN Micheline Rough, MD   10 mg at 08/10/16 2224  . rifaximin (XIFAXAN) tablet 550 mg  550 mg Oral BID Gatha Mayer, MD   550 mg at 08/11/16 1000  . senna (SENOKOT) tablet 8.6 mg  1 tablet Oral BID PRN Patrecia Pour, MD   8.6 mg at 08/08/16 1703  . sodium chloride flush (NS) 0.9 % injection 3 mL  3 mL Intravenous Q12H Karmen Bongo, MD   3 mL at 08/11/16 1000  . thiamine (VITAMIN B-1) tablet 100 mg  100 mg Oral Daily Karmen Bongo, MD   100 mg at 08/11/16 1000   Or  . thiamine (B-1) injection 100 mg  100 mg Intravenous Daily Karmen Bongo, MD   100 mg at 07/31/16 2200     Discharge Medications: Please see discharge summary for a list of discharge medications.  Relevant Imaging  Results:  Relevant Lab Results:   Additional Information [phone]  Weston Anna, LCSW

## 2016-08-11 NOTE — Progress Notes (Signed)
PROGRESS NOTE  Patrick [name]  [id number] DOB: 08/14/1950 DOA: 07/31/2016 PCP: Javier Docker, MD  Brief Narrative: 66 y.o.malewith medical history significant of CAD s/p PCI, Hep C, alcoholic cirrhosis, polysubstance abuse, and esophageal CA s/p chemo and XRT  in remission presenting with increasing abdominal distention. He endorsed decreased po intake, had been drinking alcohol, on average about a fifth per day. 3L of transudative ascitic fluid was removed with subsequently negative cultures. Acute kidney injury was noted on admission. Urinalysis unremarkable, CT abdomen showed atrophic right kidney and nonobstructing left renal stone. After marginal improvement in creatinine (1.93 > 1.40), renal function worsened with concern for hepatorenal syndrome and albumin was given on 01/03. On admission he had heme positive stool and has been given vitamin K for elevated INR on admission, most recently down to 1.9. On 01/03 he became hypotensive with a drop in hemoglobin (12.3 >> 7.7) and was transfused 1u PRBCs. Previous EGD in March 2017 by Dr. Carlean Purl showed no varices and negative biopsy. GI consulted and EGD performed 1/5 showed significant stenosis which was dilated without source of bleeding found. Repeat therapeutic paracentesis 1/5 yielded 1.6L slightly hazy yellow fluid. He has remained hemodynamically stable, transferred back to medical floor, and is stable for discharge. After declining SNF disposition earlier this admission, he, with his family's encouragement, is amenable to SNF placement. He is medically stable for discharge.   Assessment & Plan: Acute liver failure with ascites due to chronic alcoholic cirrhosis: Child-Pugh score 12 (Class C), INR 2.10 at admission. 3L transudative paracentesis 12/29 without evidence of SBP on cultures, repeat therapeutic paracentesis on 1/5 yielding 1.6L. - Holding lasix/spironolactone due to hypotension. - Ammonia mildly elevated at 41, so rifaximin  was started 1/1. - Monitoring resultant thrombocytopenia and coagulopathy - Palliative care consulted.  Upper GI bleed with acute on chronic anemia: Heme positive in ED without gross GI bleeding since that time. Patient has coagulopathy with elevated INR and thrombocytopenia. Hemoglobin dropped to 7.7 on 01/03 prompting 1u PRBCs and platelet transfusion with improvement to 10.1, essentially stable since that time, 9.6 on 01/05. Esophageal stricture post XRT which was dilated at EGD. Small < 5 mm esophageal varices were found. - Diet advance as tolerated.  - Patient to follow-up with GI as outpatient in 2 weeks. - Given vitamin K 10mg  x2. Consider daily vitamin K. - DC stress steroids  Acute renal failure likely in the setting of ascites and liver disease versus  progressive chronic kidney disease. UA unremarkable. CT scan of abdomen showed atrophic right kidney and nonobstructive 3 mm stone in the left kidney with no left hydroureter. FENa 01/04 was 0.1% indicating prerenal etiology. - Continue to monitor daily: returned to baseline.  - Avoid nephrotoxins.  Severe malnutrition: s/p PEG tube due to dysphagia during treatment for esophageal CA. Not taking good po since admission. - Nutrition consulted.  - Continue dysphagia diet per SLP, advancing - Consider TPN/NGT/PEG depending on clinical course/pt wishes.  Constipation:  - Continue lactulose 10g daily - Senna BID and BID prn, dulcolax suppository  Anxiety:  - Controlled on xanax 0.5mg  po TID prn  Peripheral neuropathy: Chronic, due to EtOH. - Continue gabapentin and fentanyl per palliative  Thrombocytopenia: Chronic, stable. Received transfusion 01/04. - Monitor CBC - Holding ASA  Prolonged QT - Will need to judiciously use medications which may increase the QT interval - Continue on tele  Chronic ETOH dependence: without evidence of withdrawal. Reports last drink was before Christmas. - Refer to outpatient  rehabilitation. - Will continue vitamins and supportive care.   Tobacco dependence - Encourage cessation.  - Nicotine patch ordered  Physical deconditioning:  - Will require ongoing physical therapy at SNF - Continue palliative care follow up at SNF  DVT prophylaxis: SCD. No anticoagulation because of coagulopathy and thrombocytopenia Code Status: DNR Family Communication: None at bedside this AM Disposition Plan: Discharge to SNF when bed available.  Consultants:   Palliative care  CCM  GI  Procedures: U/S-guided paracentesis EGD 1/5: - Benign-appearing esophageal stenosis. Dilated. - Small (< 5 mm) esophageal varices. - Portal gastropathy  Antimicrobials: None  Subjective: Pt feels better, still weak but breathing better. Abdomen less uncomfortable than last week. No N/V/D.  Objective: Vitals:   08/10/16 0451 08/10/16 1344 08/10/16 2030 08/11/16 0537  BP:  121/80 98/72 114/74  Pulse:  75 84 80  Resp:  18 16 18   Temp:  97.9 F (36.6 C) 98 F (36.7 C) 97.9 F (36.6 C)  TempSrc:  Oral Oral Oral  SpO2:  98% 97% 94%  Weight: 85.1 kg (187 lb 9.8 oz)   85.2 kg (187 lb 13.3 oz)  Height:        Intake/Output Summary (Last 24 hours) at 08/11/16 0714 Last data filed at 08/11/16 0538  Gross per 24 hour  Intake              730 ml  Output             1000 ml  Net             -270 ml   Filed Weights   08/09/16 0500 08/10/16 0451 08/11/16 0537  Weight: 80.4 kg (177 lb 4 oz) 85.1 kg (187 lb 9.8 oz) 85.2 kg (187 lb 13.3 oz)    Examination: General exam: Ill-looking male lying with elevated HOB Respiratory system: Clear to auscultation. Respiratory effort normal on 2L [place]. No wheezing or crackle Cardiovascular system: S1 & S2 heard, RRR.  No pedal edema. Gastrointestinal system: Abdomen is more distended, nontender. Bowel sound positive. Central nervous system: Alert, awake and following commands. Diffusely weak without focal sensorimotor deficits. Upper extremities  with mild tremor. Extremities: No deformities Skin: No rashes, lesions or ulcers Psychiatry: Judgement and insight appear normal. Depressed mood with flattened affect.  Data Reviewed: I have personally reviewed following labs and imaging studies  CBC:  Recent Labs Lab 08/06/16 0525  08/07/16 2052 08/08/16 0354 08/09/16 0306 08/10/16 0417 08/11/16 0415  WBC 3.4*  < > 6.6 7.2 6.5 7.8 8.1  NEUTROABS 1.8  --   --   --   --   --   --   HGB 9.3*  < > 9.9* 9.6* 9.9* 10.5* 11.0*  HCT 25.0*  < > 26.3* 25.7* 27.2* 30.2* 30.4*  MCV 100.4*  < > 96.3 97.0 97.5 98.7 97.7  PLT 43*  < > 89* 53* 58* 56* 52*  < > = values in this interval not displayed. Basic Metabolic Panel:  Recent Labs Lab 08/07/16 1403 08/08/16 0354 08/09/16 0306 08/10/16 0417 08/11/16 0415  NA 132* 137 134* 134* 132*  K 4.3 4.7 4.3 4.3 4.4  CL 98* 100* 99* 97* 96*  CO2 25 28 27 30 29   GLUCOSE 322* 250* 285* 269* 281*  BUN 36* 38* 44* 46* 46*  CREATININE 1.82* 1.69* 1.64* 1.54* 1.19  CALCIUM 8.9 9.1 9.0 9.4 9.2   GFR: Estimated Creatinine Clearance: 67 mL/min (by C-G formula based on SCr of 1.19 mg/dL). Liver  Function Tests:  Recent Labs Lab 08/07/16 1403 08/08/16 0354 08/09/16 0306 08/10/16 0417 08/11/16 0415  AST 217* 187* 171* 150* 139*  ALT 86* 85* 97* 113* 126*  ALKPHOS 122 126 125 176* 200*  BILITOT 2.5* 2.1* 2.2* 2.2* 2.3*  PROT 6.5 5.9* 6.2* 6.8 6.4*  ALBUMIN 3.3* 3.0* 3.0* 3.1* 2.6*   No results for input(s): LIPASE, AMYLASE in the last 168 hours. No results for input(s): AMMONIA in the last 168 hours. Coagulation Profile:  Recent Labs Lab 08/05/16 0318 08/06/16 0525 08/09/16 0306 08/10/16 0417 08/11/16 0415  INR 2.03 1.91 1.64 1.72 1.82   Cardiac Enzymes: No results for input(s): CKTOTAL, CKMB, CKMBINDEX, TROPONINI in the last 168 hours. BNP (last 3 results) No results for input(s): PROBNP in the last 8760 hours. HbA1C: No results for input(s): HGBA1C in the last 72  hours. CBG:  Recent Labs Lab 08/04/16 0740 08/04/16 1217  GLUCAP 132* 162*   Lipid Profile: No results for input(s): CHOL, HDL, LDLCALC, TRIG, CHOLHDL, LDLDIRECT in the last 72 hours. Thyroid Function Tests: No results for input(s): TSH, T4TOTAL, FREET4, T3FREE, THYROIDAB in the last 72 hours. Anemia Panel: No results for input(s): VITAMINB12, FOLATE, FERRITIN, TIBC, IRON, RETICCTPCT in the last 72 hours. Sepsis Labs: No results for input(s): PROCALCITON, LATICACIDVEN in the last 168 hours.  Recent Results (from the past 240 hour(s))  Culture, body fluid-bottle     Status: None   Collection Time: 08/01/16 10:38 AM  Result Value Ref Range Status   Specimen Description FLUID PERITONEAL  Final   Special Requests NONE  Final   Culture   Final    NO GROWTH 5 DAYS Performed at [facility]    Report Status 08/06/2016 FINAL  Final  Gram stain     Status: None   Collection Time: 08/01/16 10:38 AM  Result Value Ref Range Status   Specimen Description FLUID PERITONEAL  Final   Special Requests NONE  Final   Gram Stain   Final    RARE WBC PRESENT,BOTH PMN AND MONONUCLEAR NO ORGANISMS SEEN Performed at [facility]    Report Status 08/01/2016 FINAL  Final  MRSA PCR Screening     Status: None   Collection Time: 08/06/16  5:00 PM  Result Value Ref Range Status   MRSA by PCR NEGATIVE NEGATIVE Final    Comment:        The GeneXpert MRSA Assay (FDA approved for NASAL specimens only), is one component of a comprehensive MRSA colonization surveillance program. It is not intended to diagnose MRSA infection nor to guide or monitor treatment for MRSA infections.     Radiology Studies: No results found. Scheduled Meds: . chlorhexidine  15 mL Mouth Rinse BID  . feeding supplement (ENSURE ENLIVE)  237 mL Oral 5 X Daily  . folic acid  1 mg Oral Daily  . lactulose  10 g Oral Daily  . mouth rinse  15 mL Mouth Rinse q12n4p  . methylPREDNISolone (SOLU-MEDROL) injection   40 mg Intravenous Q12H  . metoprolol tartrate  12.5 mg Oral BID  . multivitamin with minerals  1 tablet Oral Daily  . nicotine  21 mg Transdermal Daily  . rifaximin  550 mg Oral BID  . sodium chloride flush  3 mL Intravenous Q12H  . thiamine  100 mg Oral Daily   Or  . thiamine  100 mg Intravenous Daily   Time spent: 25 min   LOS: 11 days   Vance Gather, MD [facility] Pager [phone]   If 7PM-7AM, please contact night-coverage [email or web address] Password [email or web address] 08/11/2016, 7:14 AM

## 2016-08-12 LAB — COMPREHENSIVE METABOLIC PANEL
ALBUMIN: 2.8 g/dL — AB (ref 3.5–5.0)
ALT: 147 U/L — AB (ref 17–63)
AST: 142 U/L — AB (ref 15–41)
Alkaline Phosphatase: 305 U/L — ABNORMAL HIGH (ref 38–126)
Anion gap: 9 (ref 5–15)
BUN: 48 mg/dL — AB (ref 6–20)
CHLORIDE: 96 mmol/L — AB (ref 101–111)
CO2: 28 mmol/L (ref 22–32)
CREATININE: 1.25 mg/dL — AB (ref 0.61–1.24)
Calcium: 9.6 mg/dL (ref 8.9–10.3)
GFR calc Af Amer: >60 mL/min (ref >60)
GFR calc non Af Amer: 59 mL/min — ABNORMAL LOW (ref >60)
Glucose, Bld: 260 mg/dL — ABNORMAL HIGH (ref 65–99)
POTASSIUM: 4.3 mmol/L (ref 3.5–5.1)
SODIUM: 133 mmol/L — AB (ref 135–145)
Total Bilirubin: 2.4 mg/dL — ABNORMAL HIGH (ref 0.3–1.2)
Total Protein: 6.7 g/dL (ref 6.5–8.1)

## 2016-08-12 LAB — CBC
HCT: 34.1 % — ABNORMAL LOW (ref 39.0–52.0)
Hemoglobin: 12.4 g/dL — ABNORMAL LOW (ref 13.0–17.0)
MCH: 35.8 pg — ABNORMAL HIGH (ref 26.0–34.0)
MCHC: 36.4 g/dL — ABNORMAL HIGH (ref 30.0–36.0)
MCV: 98.6 fL (ref 78.0–100.0)
PLATELETS: 52 10*3/uL — AB (ref 150–400)
RBC: 3.46 MIL/uL — AB (ref 4.22–5.81)
RDW: 22.2 % — ABNORMAL HIGH (ref 11.5–15.5)
WBC: 9.9 10*3/uL (ref 4.0–10.5)

## 2016-08-12 LAB — PROTIME-INR
INR: 1.65
Prothrombin Time: 19.7 seconds — ABNORMAL HIGH (ref 11.4–15.2)

## 2016-08-12 MED ORDER — LORAZEPAM 1 MG PO TABS
1.0000 mg | ORAL_TABLET | Freq: Once | ORAL | Status: AC
  Administered 2016-08-12: 1 mg via ORAL
  Filled 2016-08-12: qty 1

## 2016-08-12 NOTE — Progress Notes (Signed)
Occupational Therapy Treatment Patient Details Name: Patrick [name] MRN: [id number] DOB: 1951-05-15 Today's Date: 08/12/2016    History of present illness  66 y.o. male with medical history significant of CAD s/p PCI, Hep C, alcoholic cirrhosis, polysubstance abuse, thrombocytopenia, and esophageal CA s/p PEG tube placement and subsequent removal; adm 07/31/16 with abdominal distention   OT comments  Pt lethargic but did participate in joint OT/PT session.  Need +2 for all mobility  Follow Up Recommendations  SNF    Equipment Recommendations  3 in 1 bedside commode    Recommendations for Other Services      Precautions / Restrictions Precautions Precautions: Fall Precaution Comments: AMS.   Restrictions Weight Bearing Restrictions: No       Mobility Bed Mobility         Supine to sit: Total assist;+2 for physical assistance Sit to supine: Total assist;+2 for physical assistance   General bed mobility comments: pt very slow to move, performed less than 25% of bed mobility  Transfers   Equipment used: Rolling walker (2 wheeled)   Sit to Stand: Mod assist;+2 physical assistance         General transfer comment: assist to rise, stabilize and control descent    Balance     Sitting balance-Leahy Scale: Fair Sitting balance - Comments: min guard for safety, once he was sitting with feet supported                           ADL   Eating/Feeding: Minimal assistance;Sitting (for beverage)   Grooming: Wash/dry face;Maximal assistance;Sitting                   Toilet Transfer: Moderate assistance;+2 for physical assistance;BSC;RW;Stand-pivot   Toileting- Clothing Manipulation and Hygiene: Total assistance;+2 for physical assistance;Sit to/from stand         General ADL Comments: pt initially sleepy:  sat EOB and performed above activities.  Legs shaky/ataxic with sidestepping.  Stood x 4 times including transfer to commode      Vision                  Additional Comments: pt did not focus on therapist; eyes closed part of session   Perception     Praxis      Cognition   Behavior During Therapy: Flat affect Overall Cognitive Status: No family/caregiver present to determine baseline cognitive functioning          Following Commands: Follows one step commands inconsistently     Problem Solving: Slow processing;Decreased initiation;Difficulty sequencing;Requires verbal cues;Requires tactile cues General Comments: pt very slow to respond; needed hand over hand assistance at times to initiate    Extremity/Trunk Assessment               Exercises     Shoulder Instructions       General Comments      Pertinent Vitals/ Pain       Pain Assessment: Faces Faces Pain Scale: Hurts even more Pain Location: stomach Pain Intervention(s): Patient requesting pain meds-RN notified;Repositioned  Home Living                                          Prior Functioning/Environment              Frequency  Min 2X/week  Progress Toward Goals  OT Goals(current goals can now be found in the care plan section)  Progress towards OT goals: Progressing toward goals (slowly)  Acute Rehab OT Goals Patient Stated Goal: get stronger Time For Goal Achievement: 08/16/16  Plan      Co-evaluation    PT/OT/SLP Co-Evaluation/Treatment: Yes Reason for Co-Treatment: For patient/therapist safety PT goals addressed during session: Mobility/safety with mobility OT goals addressed during session: ADL's and self-care      End of Session     Activity Tolerance Patient limited by lethargy;Patient limited by pain   Patient Left in bed;with bed alarm set;with call bell/phone within reach   Nurse Communication          Time: [phone] OT Time Calculation (min): 25 min  Charges: OT General Charges $OT Visit: 1 Procedure OT Treatments $Self Care/Home Management : 8-22  mins  Gracious Renken 08/12/2016, 9:36 AM  Lesle Chris, OTR/L [phone] 08/12/2016

## 2016-08-12 NOTE — Progress Notes (Signed)
Physical Therapy Treatment Patient Details Name: Patrick [name] MRN: [id number] DOB: July 31, 1951 Today's Date: 08/12/2016    History of Present Illness  66 y.o. male with medical history significant of CAD s/p PCI, Hep C, alcoholic cirrhosis, polysubstance abuse, thrombocytopenia, and esophageal CA s/p PEG tube placement and subsequent removal; adm 07/31/16 with abdominal distention    PT Comments    The patient  Is very slow to initiate activity, requires much encouragement. Requires 2 person assist for all aspects of mobility. Continue PT while in acute care.  Follow Up Recommendations  SNF     Equipment Recommendations  None recommended by PT    Recommendations for Other Services       Precautions / Restrictions Precautions Precautions: Fall Precaution Comments: AMS.   Restrictions Weight Bearing Restrictions: No    Mobility  Bed Mobility         Supine to sit: Total assist;+2 for physical assistance Sit to supine: Total assist;+2 for physical assistance   General bed mobility comments: pt very slow to move, performed less than 25% of bed mobility  Transfers   Equipment used: Rolling walker (2 wheeled)   Sit to Stand: Mod assist;+2 physical assistance Stand pivot transfers: Mod assist;+2 safety/equipment       General transfer comment: assist to rise, stabilize and control descent, multimodal cues to pivot to [facility] and then back to bed. y slow and decreased initiation  Ambulation/Gait Ambulation/Gait assistance: Mod assist;+2 physical assistance;+2 safety/equipment           General Gait Details: side steps along the bed with facilitation.   Stairs            Wheelchair Mobility    Modified Rankin (Stroke Patients Only)       Balance     Sitting balance-Leahy Scale: Fair Sitting balance - Comments: min guard for safety, once he was sitting with feet supported   Standing balance support: Bilateral upper extremity supported Standing  balance-Leahy Scale: Poor                      Cognition Arousal/Alertness: Lethargic Behavior During Therapy: Flat affect Overall Cognitive Status: No family/caregiver present to determine baseline cognitive functioning Area of Impairment: Following commands;Problem solving       Following Commands: Follows one step commands inconsistently     Problem Solving: Slow processing;Decreased initiation;Difficulty sequencing;Requires verbal cues;Requires tactile cues General Comments: pt very slow to respond; needed hand over hand assistance at times to initiate, keeps eyes closed most of the time-    Exercises      General Comments        Pertinent Vitals/Pain Pain Assessment: Faces Faces Pain Scale: Hurts even more Pain Location: stomach Pain Descriptors / Indicators: Sore;Discomfort Pain Intervention(s): Repositioned;Monitored during session    Home Living                      Prior Function            PT Goals (current goals can now be found in the care plan section) Acute Rehab PT Goals Patient Stated Goal: get stronger Progress towards PT goals: Progressing toward goals    Frequency    Min 3X/week      PT Plan Current plan remains appropriate    Co-evaluation PT/OT/SLP Co-Evaluation/Treatment: Yes Reason for Co-Treatment: To address functional/ADL transfers;For patient/therapist safety PT goals addressed during session: Mobility/safety with mobility OT goals addressed during session: ADL's and self-care  End of Session   Activity Tolerance: Patient limited by fatigue;Patient limited by lethargy Patient left: in bed;with call bell/phone within reach;with bed alarm set     Time: [id number] PT Time Calculation (min) (ACUTE ONLY): 25 min  Charges:  $Therapeutic Activity: 8-22 mins                    G Codes:      Marcelino Freestone PT [id number]  08/12/2016, 10:24 AM

## 2016-08-12 NOTE — Progress Notes (Signed)
Xanax 0.5 mg given at a 4 hour interval.Pt is agitated  Requesting over and over that his pants be removed and he does not have any on. He stated that he wanted to rip his pants.His brother gave him a paper towel and Patrick [name] tore that up

## 2016-08-12 NOTE — Progress Notes (Signed)
CSW assisting with d/c planning. [place] contacted. No decision from [facility]. [place] will call [employer] again and contact CSW with update. SNF will not accept medicaid with a decision from [facility] pending.  Werner Lean LCSW [phone]

## 2016-08-12 NOTE — Progress Notes (Signed)
PROGRESS NOTE  Patrick [name]  [id number] DOB: May 24, 1951 DOA: 07/31/2016 PCP: Javier Docker, MD  Brief Narrative: 66 y.o.malewith medical history significant of CAD s/p PCI, Hep C, alcoholic cirrhosis, polysubstance abuse, and esophageal CA s/p chemo and XRT  in remission presenting with increasing abdominal distention. He endorsed decreased po intake, had been drinking alcohol, on average about a fifth per day. 3L of transudative ascitic fluid was removed with subsequently negative cultures. Acute kidney injury was noted on admission. Urinalysis unremarkable, CT abdomen showed atrophic right kidney and nonobstructing left renal stone. After marginal improvement in creatinine (1.93 > 1.40), renal function worsened with concern for hepatorenal syndrome and albumin was given on 01/03. On admission he had heme positive stool and has been given vitamin K for elevated INR on admission, most recently down to 1.9. On 01/03 he became hypotensive with a drop in hemoglobin (12.3 >> 7.7) and was transfused 1u PRBCs. Previous EGD in March 2017 by Dr. Carlean Purl showed no varices and negative biopsy. GI consulted and EGD performed 1/5 showed significant stenosis which was dilated without source of bleeding found. Repeat therapeutic paracentesis 1/5 yielded 1.6L slightly hazy yellow fluid. He has remained hemodynamically stable, transferred back to medical floor, and is stable for discharge. After declining SNF disposition earlier this admission, he, with his family's encouragement, is amenable to SNF placement. He is medically stable for discharge.   Assessment & Plan: Acute liver failure with ascites due to chronic alcoholic cirrhosis: Child-Pugh score 12 (Class C), INR 2.10 at admission. 3L transudative paracentesis 12/29 without evidence of SBP on cultures, repeat therapeutic paracentesis on 1/5 yielding 1.6L. - Holding lasix/spironolactone due to hypotension. - Ammonia mildly elevated at 41, so rifaximin  was started 1/1. - Monitoring resultant thrombocytopenia and coagulopathy - both stable. - Palliative care consulted.  Upper GI bleed with acute on chronic anemia: Heme positive in ED without gross GI bleeding since that time. Patient has coagulopathy with elevated INR and thrombocytopenia. Hemoglobin dropped to 7.7 on 01/03 prompting 1u PRBCs and platelet transfusion with improvement to 10.1, essentially stable since that time, 9.6 on 01/05. Esophageal stricture post XRT which was dilated at EGD. Small < 5 mm esophageal varices were found. - Diet advance as tolerated > dysphagia diet per SLP. - Patient to follow-up with GI as outpatient in 2 weeks. - Given vitamin K 10mg  x2. Consider daily vitamin K. INR stable at 1.62  Acute renal failure likely in the setting of ascites and liver disease versus  progressive chronic kidney disease. UA unremarkable. CT scan of abdomen showed atrophic right kidney and nonobstructive 3 mm stone in the left kidney with no left hydroureter. FENa 01/04 was 0.1% indicating prerenal etiology. - Continue to monitor daily: returned to baseline.  - Avoid nephrotoxins.  Severe malnutrition: s/p PEG tube due to dysphagia during treatment for esophageal CA. Not taking good po since admission. - Improving s/p esophageal dilatation - Nutrition consulted.  - Continue dysphagia diet per SLP  Constipation:  - Continue lactulose 10g daily - Senna BID and BID prn, dulcolax suppository  Anxiety:  - Controlled on xanax 0.5mg  po TID prn  Peripheral neuropathy: Chronic, due to EtOH. - Continue gabapentin and fentanyl per palliative  Thrombocytopenia: Chronic, stable. Received transfusion 01/04. - Monitor CBC - Holding ASA  Prolonged QT - Will need to judiciously use medications which may increase the QT interval - Continue on tele  Chronic ETOH dependence: without evidence of withdrawal. Reports last drink was before Christmas. - Refer  to outpatient  rehabilitation. - Will continue vitamins and supportive care.   Tobacco dependence - Encourage cessation.  - Nicotine patch ordered  Physical deconditioning:  - Will require ongoing physical therapy at SNF - Continue palliative care follow up at SNF  DVT prophylaxis: SCD. No anticoagulation because of coagulopathy and thrombocytopenia Code Status: DNR Family Communication: None at bedside this AM Disposition Plan: Discharge to SNF when bed available.  Consultants:   Palliative care  CCM  GI  Procedures: U/S-guided paracentesis EGD 1/5: - Benign-appearing esophageal stenosis. Dilated. - Small (< 5 mm) esophageal varices. - Portal gastropathy  Antimicrobials: None  Subjective: Still weak but breathing is stable, no new complaints or events overnight. Eating ok.  Objective: Vitals:   08/12/16 0436 08/12/16 0505 08/12/16 1145 08/12/16 1430  BP: 115/85  110/78 111/76  Pulse: 89  94 97  Resp: 18   18  Temp: 98 F (36.7 C)   98.5 F (36.9 C)  TempSrc: Oral   Oral  SpO2: 99%   93%  Weight:  85.3 kg (188 lb)    Height:        Intake/Output Summary (Last 24 hours) at 08/12/16 2000 Last data filed at 08/12/16 1831  Gross per 24 hour  Intake              360 ml  Output             1200 ml  Net             -840 ml   Filed Weights   08/10/16 0451 08/11/16 0537 08/12/16 0505  Weight: 85.1 kg (187 lb 9.8 oz) 85.2 kg (187 lb 13.3 oz) 85.3 kg (188 lb)    Examination: General exam: Ill-looking male lying with elevated HOB Respiratory system: Clear to auscultation. Respiratory effort normal on 2L [place]. No wheezing or crackle Cardiovascular system: S1 & S2 heard, RRR.  No pedal edema. Gastrointestinal system: Abdomen is distended, nontender. Bowel sound positive. Central nervous system: Alert, awake and following commands. Diffusely weak without focal sensorimotor deficits. Upper extremities with mild tremor. Extremities: No deformities Skin: No rashes, lesions or  ulcers Psychiatry: Judgement and insight appear normal. Depressed mood with flattened affect.  Data Reviewed: I have personally reviewed following labs and imaging studies  CBC:  Recent Labs Lab 08/06/16 0525  08/08/16 0354 08/09/16 0306 08/10/16 0417 08/11/16 0415 08/12/16 0422  WBC 3.4*  < > 7.2 6.5 7.8 8.1 9.9  NEUTROABS 1.8  --   --   --   --   --   --   HGB 9.3*  < > 9.6* 9.9* 10.5* 11.0* 12.4*  HCT 25.0*  < > 25.7* 27.2* 30.2* 30.4* 34.1*  MCV 100.4*  < > 97.0 97.5 98.7 97.7 98.6  PLT 43*  < > 53* 58* 56* 52* 52*  < > = values in this interval not displayed. Basic Metabolic Panel:  Recent Labs Lab 08/08/16 0354 08/09/16 0306 08/10/16 0417 08/11/16 0415 08/12/16 0422  NA 137 134* 134* 132* 133*  K 4.7 4.3 4.3 4.4 4.3  CL 100* 99* 97* 96* 96*  CO2 28 27 30 29 28   GLUCOSE 250* 285* 269* 281* 260*  BUN 38* 44* 46* 46* 48*  CREATININE 1.69* 1.64* 1.54* 1.19 1.25*  CALCIUM 9.1 9.0 9.4 9.2 9.6   GFR: Estimated Creatinine Clearance: 63.8 mL/min (by C-G formula based on SCr of 1.25 mg/dL (H)). Liver Function Tests:  Recent Labs Lab 08/08/16 0354 08/09/16 0306 08/10/16  [id number] 08/11/16 0415 08/12/16 0422  AST 187* 171* 150* 139* 142*  ALT 85* 97* 113* 126* 147*  ALKPHOS 126 125 176* 200* 305*  BILITOT 2.1* 2.2* 2.2* 2.3* 2.4*  PROT 5.9* 6.2* 6.8 6.4* 6.7  ALBUMIN 3.0* 3.0* 3.1* 2.6* 2.8*   No results for input(s): LIPASE, AMYLASE in the last 168 hours. No results for input(s): AMMONIA in the last 168 hours. Coagulation Profile:  Recent Labs Lab 08/06/16 0525 08/09/16 0306 08/10/16 0417 08/11/16 0415 08/12/16 0422  INR 1.91 1.64 1.72 1.82 1.65   Cardiac Enzymes: No results for input(s): CKTOTAL, CKMB, CKMBINDEX, TROPONINI in the last 168 hours. BNP (last 3 results) No results for input(s): PROBNP in the last 8760 hours. HbA1C: No results for input(s): HGBA1C in the last 72 hours. CBG: No results for input(s): GLUCAP in the last 168 hours. Lipid  Profile: No results for input(s): CHOL, HDL, LDLCALC, TRIG, CHOLHDL, LDLDIRECT in the last 72 hours. Thyroid Function Tests: No results for input(s): TSH, T4TOTAL, FREET4, T3FREE, THYROIDAB in the last 72 hours. Anemia Panel: No results for input(s): VITAMINB12, FOLATE, FERRITIN, TIBC, IRON, RETICCTPCT in the last 72 hours. Sepsis Labs: No results for input(s): PROCALCITON, LATICACIDVEN in the last 168 hours.  Recent Results (from the past 240 hour(s))  MRSA PCR Screening     Status: None   Collection Time: 08/06/16  5:00 PM  Result Value Ref Range Status   MRSA by PCR NEGATIVE NEGATIVE Final    Comment:        The GeneXpert MRSA Assay (FDA approved for NASAL specimens only), is one component of a comprehensive MRSA colonization surveillance program. It is not intended to diagnose MRSA infection nor to guide or monitor treatment for MRSA infections.     Radiology Studies: No results found. Scheduled Meds: . chlorhexidine  15 mL Mouth Rinse BID  . feeding supplement (ENSURE ENLIVE)  237 mL Oral 5 X Daily  . folic acid  1 mg Oral Daily  . lactulose  10 g Oral Daily  . mouth rinse  15 mL Mouth Rinse q12n4p  . metoprolol tartrate  12.5 mg Oral BID  . multivitamin with minerals  1 tablet Oral Daily  . nicotine  21 mg Transdermal Daily  . rifaximin  550 mg Oral BID  . senna  1 tablet Oral BID  . sodium chloride flush  3 mL Intravenous Q12H  . thiamine  100 mg Oral Daily   Or  . thiamine  100 mg Intravenous Daily   Time spent: 15 min   LOS: 12 days   Vance Gather, MD [facility] Pager [phone]   If 7PM-7AM, please contact night-coverage [email or web address] Password [email or web address] 08/12/2016, 8:00 PM

## 2016-08-12 NOTE — Progress Notes (Signed)
CSW assisting with d/c planning. Several unsuccessful attempts made to connect with Admissions at [facility] to check status of [facility] SNF request. CSW will continue to try to reach SNF for update. Pt's brother has been updated.  Werner Lean LCSW [phone]

## 2016-08-12 NOTE — Progress Notes (Signed)
CSW spoke with Admissions at [facility] this afternoon. [employer] insurance authorization is still pending. SNF will call CSW as soon as a decision has been made.   Werner Lean LCSW [phone]

## 2016-08-13 MED ORDER — ALPRAZOLAM 0.5 MG PO TABS
0.5000 mg | ORAL_TABLET | Freq: Three times a day (TID) | ORAL | 0 refills | Status: AC | PRN
Start: 2016-08-13 — End: ?

## 2016-08-13 MED ORDER — METOPROLOL TARTRATE 25 MG PO TABS: 12.5000 mg | ORAL_TABLET | Freq: Two times a day (BID) | ORAL | Status: AC

## 2016-08-13 MED ORDER — LACTULOSE 10 GM/15ML PO SOLN: 10.0000 g | Freq: Every day | ORAL | 0 refills | Status: AC

## 2016-08-13 MED ORDER — RIFAXIMIN 550 MG PO TABS: 550.0000 mg | ORAL_TABLET | Freq: Two times a day (BID) | ORAL | Status: AC

## 2016-08-13 MED ORDER — OXYCODONE HCL 10 MG PO TABS: 10.0000 mg | ORAL_TABLET | Freq: Every day | ORAL | 0 refills | Status: AC | PRN

## 2016-08-13 MED ORDER — SENNA 8.6 MG PO TABS: 1.0000 | ORAL_TABLET | Freq: Two times a day (BID) | ORAL | 0 refills | Status: AC

## 2016-08-13 NOTE — Discharge Summary (Addendum)
Physician Discharge Summary  Patrick [name] [id number] DOB: [birth year] DOA: 07/31/2016  PCP: Javier Docker, MD  Admit date: 07/31/2016 Discharge date: 08/13/2016  Admitted From: Home Disposition: SNF   Recommendations for Outpatient Follow-up:  1. Follow up with PCP in 1-2 weeks 2. Follow up with GI in 2 weeks, s/p EGD by Dr. Carlean Purl with esophageal dilatation. 3. Monitor ascites, received paracentesis x2, last 01/05, as below. 4. Please obtain BMP to monitor electrolytes and renal function. Last creatinine 1.25, near baseline. 5. Please obtain CBC to monitor anemia and thrombocytopenia, both stable at discharge. 6. Monitor INR. Spontaneously found to be > 2, given vitamin K with improvement and no active bleeding. Consider daily vitamin K. INR 1.65 at discharge. 7. Monitor and manage constipation as below. 8. Monitor mental status, started lactulose 10g daily and rifaximin 550mg  BID. 9. Will continue ongoing palliative care services. 10. Consider voiding trial, foley placed 1/3 for acute urinary retention and continued at discharge.   Home Health: N/A Equipment/Devices: Foley 08/06/2016 Discharge Condition: Stable for transfer, guarded prognosis. CODE STATUS: DNR Diet recommendation: Dysphagia 1 diet  Brief/Interim Summary: 66 y.o.malewith medical history significant of CAD s/p PCI, Hep C, alcoholic cirrhosis, polysubstance abuse, and esophageal CA s/p chemo and XRT  in remission presenting with increasing abdominal distention. He endorsed decreased po intake, had been drinking alcohol, on average about a fifth per day. 3L of transudative ascitic fluid was removed with subsequently negative cultures. Acute kidney injury was noted on admission. Urinalysis unremarkable, CT abdomen showed atrophic right kidney and nonobstructing left renal stone. After marginal improvement in creatinine (1.93 > 1.40), renal function worsened with concern for hepatorenal syndrome and albumin was  given on 01/03. On admission he had heme positive stool and has been given vitamin K for elevated INR on admission, most recently down to 1.9. On 01/03 he became hypotensive with a drop in hemoglobin (12.3 >> 7.7) and was transfused 1u PRBCs. Previous EGD in March 2017 by Dr. Carlean Purl showed no varices and negative biopsy. GI consulted and EGD performed 1/5 showed significant stenosis which was dilated without source of bleeding found. Repeat therapeutic paracentesis 1/5 yielded 1.6L slightly hazy yellow fluid. He has remained hemodynamically stable, transferred back to medical floor, and is stable for discharge. After declining SNF disposition earlier this admission, he, with his family's encouragement, is amenable to SNF placement. He is medically stable for discharge.   Discharge Diagnoses:  Principal Problem:   Acute liver failure Active Problems:   Tobacco abuse   Thrombocytopenia (HCC)   Prolonged QT interval   Ascites   Acute renal failure (HCC)   Melena   Protein-calorie malnutrition, severe (HCC)   Alcohol withdrawal (HCC)   Palliative care encounter   Goals of care, counseling/discussion   Heme + stool   Decreased hemoglobin   Esophageal stricture   Secondary esophageal varices without bleeding (HCC)   Portal hypertensive gastropathy  Acute liver failure with ascites due to chronic alcoholic cirrhosis: Child-Pugh score 12 (Class C), INR 2.10 at admission. 3L transudative paracentesis 12/29 without evidence of SBP on cultures, repeat therapeutic paracentesis on 1/5 yielding 1.6L. - Holding lasix/spironolactone due to hypotension. - Ammonia mildly elevated at 41, so rifaximin was started 1/1. Lactulose later added to aid with constipation. - Monitoring resultant thrombocytopenia and coagulopathy - both stable. - Palliative care consulted. - Statin discontinued.   Upper GI bleed with acute on chronic anemia: Heme positive in ED without gross GI bleeding since that time. Patient  has  coagulopathy with elevated INR and thrombocytopenia. Hemoglobin dropped to 7.7 on 01/03 prompting 1u PRBCs and platelet transfusion with improvement to 10.1, essentially stable since that time, 9.6 on 01/05. Esophageal stricture post XRT which was dilated at EGD. Small < 5 mm esophageal varices were found. - Dysphagia diet per SLP. - Patient to follow-up with GI as outpatient in 2 weeks. - Given vitamin K 10mg  x2. INR stable at 1.6 at discharge.  Acute renal failure likely in the setting of ascites and liver disease versus  progressive chronic kidney disease. UA unremarkable. CT scan of abdomen showed atrophic right kidney and nonobstructive 3 mm stone in the left kidney with no left hydroureter. FENa 01/04 was 0.1% indicating prerenal etiology. - Returned to baseline.  - Avoid nephrotoxins.  Severe malnutrition: s/p PEG tube due to dysphagia during treatment for esophageal CA. Not taking good po at home. - Improving s/p esophageal dilatation - Nutrition consulted.  - Continue dysphagia diet per SLP  Constipation:  - Continue lactulose 10g daily - Senna BID and BID prn, dulcolax suppository  Anxiety:  - Controlled on xanax 0.5mg  po TID prn  Peripheral neuropathy: Chronic, due to EtOH. - Continue gabapentin and oxycodone per palliative. - Received 1 dose of fentanyl 4mcg in the last 4 days, so may need ongoing evaluation of pain.  Thrombocytopenia: Chronic, stable. Received transfusion 01/04. - Monitor CBC - Holding ASA due to platelets < 50k  Prolonged QT - Will need to judiciously use medications which may increase the QT interval  Chronic ETOH dependence: without evidence of withdrawal. Reports last drink was before Christmas. - Refer to outpatient rehabilitation depending on medical course. - No withdrawal at discharge.  - Will continue vitamins and supportive care.   Tobacco dependence - Encourage cessation.  - Nicotine patch ordered  Physical  deconditioning:  - Will require ongoing physical therapy at SNF - Continue palliative care follow up at [facility]  Discharge Instructions  Allergies as of 08/13/2016   No Known Allergies     Medication List    STOP taking these medications   AMITIZA 24 MCG capsule Generic drug:  lubiprostone   aspirin 81 MG tablet   atorvastatin 40 MG tablet Commonly known as:  LIPITOR   carvedilol 12.5 MG tablet Commonly known as:  COREG     TAKE these medications   ALPRAZolam 0.5 MG tablet Commonly known as:  XANAX Take 1 tablet (0.5 mg total) by mouth 3 (three) times daily as needed for anxiety.   lactulose 10 GM/15ML solution Commonly known as:  CHRONULAC Take 15 mLs (10 g total) by mouth daily. Start taking on:  08/14/2016   lisinopril 20 MG tablet Commonly known as:  PRINIVIL,ZESTRIL Place 1 tablet (20 mg total) into feeding tube daily.   metoprolol tartrate 25 MG tablet Commonly known as:  LOPRESSOR Take 0.5 tablets (12.5 mg total) by mouth 2 (two) times daily.   multivitamin with minerals Tabs tablet Place 1 tablet into feeding tube daily.   nitroGLYCERIN 0.4 MG SL tablet Commonly known as:  NITROSTAT Place 1 tablet (0.4 mg total) under the tongue every 5 (five) minutes as needed for chest pain.   Oxycodone HCl 10 MG Tabs Take 1 tablet (10 mg total) by mouth daily as needed (pain).   rifaximin 550 MG Tabs tablet Commonly known as:  XIFAXAN Take 1 tablet (550 mg total) by mouth 2 (two) times daily.   senna 8.6 MG Tabs tablet Commonly known as:  SENOKOT Take 1 tablet (  8.6 mg total) by mouth 2 (two) times daily. and 2 times daily as needed for constipation       Contact information for follow-up providers    Javier Docker, MD Follow up.   Specialty:  Internal Medicine Contact information: [address] [place] [address] [phone]            Contact information for after-discharge care    [facility] Follow up.    Specialty:  [place] information: [place]                 No Known Allergies  Consultations:  GI, Dr. Carlean Purl  CCM  Palliative care, Dr. Domingo Cocking  IR  Procedures/Studies: US Abdomen Complete  Result Date: 07/31/2016 CLINICAL DATA:  Abdominal pain and swelling.  Hepatitis-C. EXAM: ABDOMEN ULTRASOUND COMPLETE COMPARISON:  Ultrasound 05/16/2016 FINDINGS: Gallbladder: Small sludge within the gallbladder. Negative sonographic Murphy's sign. No distention Common bile duct: Diameter: Normal at 4 mm Liver: Liver is shrunken with a nodular contour. There is uniform increase in hepatic echogenicity. No focal hepatic lesions identified. No flow demonstrated in the portal vein. IVC: No abnormality visualized. Pancreas: Visualized portion unremarkable. Spleen: Size and appearance within normal limits. Right Kidney: Length: Atrophic, not visualized. Left Kidney: Length: 13.0 cm. Echogenicity within normal limits. No mass or hydronephrosis visualized. Abdominal aorta: No aneurysm visualized. Other findings: No ascites IMPRESSION: 1. Shrunken nodular liver consistent with cirrhosis. No focal lesion to suggest hepatoma by ultrasound surveillance. 2. No flow demonstrated in portal vein by Doppler evaluation. Consider dedicated hepatic ultrasound vascular evaluation. These results will be called to the ordering clinician or representative by the Radiologist Assistant, and communication documented in the PACS or zVision Dashboard. Electronically Signed   By: Suzy Bouchard M.D.   On: 07/31/2016 14:32   Ct Abdomen Pelvis W Contrast  Result Date: 07/31/2016 CLINICAL DATA:  Increasing abdominal distention with nausea vomiting and diarrhea. History of esophageal cancer. EXAM: CT ABDOMEN AND PELVIS WITH CONTRAST TECHNIQUE: Multidetector CT imaging of the abdomen and pelvis was performed using the standard protocol  following bolus administration of intravenous contrast. CONTRAST:  1 ISOVUE-300 IOPAMIDOL (ISOVUE-300) INJECTION 61% COMPARISON:  PET-CT 05/25/2015. FINDINGS: Lower chest:  Coronary artery calcification is evident. Hepatobiliary: Liver is markedly heterogeneous with nodular contour. There is no evidence for gallstones, gallbladder wall thickening, or pericholecystic fluid. No intrahepatic or extrahepatic biliary dilation. Pancreas: No focal mass lesion. No dilatation of the main duct. No intraparenchymal cyst. No peripancreatic edema. Spleen: No splenomegaly. No focal mass lesion. Adrenals/Urinary Tract: No adrenal nodule or mass. Right kidney is markedly atrophic with calcified lesions unchanged. Nonobstructing 3 mm stone identified interpolar left kidney which is otherwise unremarkable. No left hydroureter. Bladder is decompressed. Stomach/Bowel: Stomach is nondistended. No gastric wall thickening. No evidence of outlet obstruction. Duodenum is normally positioned as is the ligament of Treitz. No small bowel wall thickening. No small bowel dilatation. The terminal ileum is normal. The appendix is normal. No gross colonic mass. No colonic wall thickening. No substantial diverticular change. Vascular/Lymphatic: There is abdominal aortic atherosclerosis without aneurysm. Portal vein is patent. Recanalization of the paraumbilical vein is associated with left upper abdominal vascular collateralization. There is no gastrohepatic or hepatoduodenal ligament lymphadenopathy. No intraperitoneal or retroperitoneal lymphadenopathy. No pelvic sidewall lymphadenopathy. Reproductive: The prostate gland and seminal vesicles have normal imaging features. Other: Moderate volume ascites is seen in the abdomen  and pelvis. Musculoskeletal: Laxity IMPRESSION: 1. Moderate volume ascites. 2. Markedly heterogeneous liver with nodular contour and asymmetric enlargement lateral segment left lobe. Imaging features suggest cirrhosis. 3.  Ultrasound exam earlier today questioned lack of flow in the portal vein although portal vein opacification is normal on this CT exam. Recanalization of the paraumbilical vein and left abdominal collateralization suggests associated portal venous hypertension. 4. Stable appearance atrophic right kidney with several calcified parenchymal lesions. Electronically Signed   By: Misty Stanley M.D.   On: 07/31/2016 17:35   US Paracentesis  Result Date: 08/08/2016 INDICATION: Cirrhosis, hepatitis-C, esophageal cancer, recurrent ascites. Request made for therapeutic paracentesis. EXAM: ULTRASOUND GUIDED THERAPEUTIC PARACENTESIS MEDICATIONS: None. COMPLICATIONS: None immediate. PROCEDURE: Informed written consent was obtained from the patient after a discussion of the risks, benefits and alternatives to treatment. A timeout was performed prior to the initiation of the procedure. Initial ultrasound scanning demonstrates a small amount of ascites within the right mid to lower abdominal quadrant. The right mid to lower abdomen was prepped and draped in the usual sterile fashion. 1% lidocaine was used for local anesthesia. Following this, a Yueh catheter was introduced. An ultrasound image was saved for documentation purposes. The paracentesis was performed. The catheter was removed and a dressing was applied. The patient tolerated the procedure well without immediate post procedural complication. FINDINGS: A total of approximately 1.6 liters of slightly hazy, yellow fluid was removed. IMPRESSION: Successful ultrasound-guided therapeutic paracentesis yielding 1.6 liters of peritoneal fluid. Read by: Rowe Robert, PA-C Electronically Signed   By: Aletta Edouard M.D.   On: 08/08/2016 17:02   US Paracentesis  Result Date: 08/01/2016 INDICATION: Patient with a history of hepatitis-C and alcoholic cirrhosis. Ascites present. Request is made for diagnostic and therapeutic paracentesis. EXAM: ULTRASOUND GUIDED DIAGNOSTIC AND  THERAPEUTIC PARACENTESIS MEDICATIONS: 1% lidocaine. COMPLICATIONS: None immediate. PROCEDURE: Informed written consent was obtained from the patient after a discussion of the risks, benefits and alternatives to treatment. A timeout was performed prior to the initiation of the procedure. Initial ultrasound scanning demonstrates a moderate amount of ascites within the left lower abdominal quadrant. The left lower abdomen was prepped and draped in the usual sterile fashion. 1% lidocaine was used for local anesthesia. Following this, a 19 gauge, 7-cm, Yueh catheter was introduced. An ultrasound image was saved for documentation purposes. The paracentesis was performed. The catheter was removed and a dressing was applied. The patient tolerated the procedure well without immediate post procedural complication. FINDINGS: A total of approximately 3 L of clear yellow fluid was removed. Samples were sent to the laboratory as requested by the clinical team. IMPRESSION: Successful ultrasound-guided paracentesis yielding 3 liters of peritoneal fluid. Read by: Saverio Danker, PA-C Electronically Signed   By: Lucrezia Europe M.D.   On: 08/01/2016 11:19   Dg Chest Port 1 View  Result Date: 08/06/2016 CLINICAL DATA:  Initial evaluation for acute hypoxemia. EXAM: PORTABLE CHEST 1 VIEW COMPARISON:  Prior radiograph from 06/26/2015. FINDINGS: Examination is somewhat technically limited by patient positioning as the patient is markedly rotated to the right. Cardiomegaly is stable. Allowing for patient rotation, mediastinal silhouette within normal limits. Aortic atherosclerosis noted. Lungs are hypoinflated. Associated mild bibasilar atelectasis/ bronchovascular crowding. No focal infiltrates. No pulmonary edema or pleural effusion. No pneumothorax. No acute osseous abnormality. Remotely healed right-sided rib fractures noted. Prominent vascular calcifications noted within the axillae bilaterally. IMPRESSION: 1. Shallow lung inflation with  mild bibasilar opacities. Atelectasis/bronchovascular crowding is favored. 2. No other active cardiopulmonary disease identified. 3.  Stable mild cardiomegaly without pulmonary edema. 4. Prominent atherosclerosis within the aorta and subclavian/axillary arteries. Electronically Signed   By: Jeannine Boga M.D.   On: 08/06/2016 19:35   EGD 1/5: - Benign-appearing esophageal stenosis. Dilated. - Small (< 5 mm) esophageal varices. - Portal gastropathy  Subjective: Pain is controlled. No new complaints. Eating ok, had BM.  Discharge Exam: Vitals:   08/12/16 2046 08/13/16 0549  BP: 94/76 113/84  Pulse: (!) 105 (!) 104  Resp: 18 18  Temp: 97.5 F (36.4 C) 97.9 F (36.6 C)   Vitals:   08/12/16 1145 08/12/16 1430 08/12/16 2046 08/13/16 0549  BP: 110/78 111/76 94/76 113/84  Pulse: 94 97 (!) 105 (!) 104  Resp:  18 18 18   Temp:  98.5 F (36.9 C) 97.5 F (36.4 C) 97.9 F (36.6 C)  TempSrc:  Oral Oral Oral  SpO2:  93% 95% 91%  Weight:      Height:       General: Chronically ill and weak-appearing male in no distress Cardiovascular: RRR, S1/S2 +, no rubs, no gallops Respiratory: CTA bilaterally, no wheezing, no rhonchi Abdominal: Soft, distended, nontender. +BS. Extremities: no edema, no cyanosis  The results of significant diagnostics from this hospitalization (including imaging, microbiology, ancillary and laboratory) are listed below for reference.    Microbiology: Recent Results (from the past 240 hour(s))  MRSA PCR Screening     Status: None   Collection Time: 08/06/16  5:00 PM  Result Value Ref Range Status   MRSA by PCR NEGATIVE NEGATIVE Final    Comment:        The GeneXpert MRSA Assay (FDA approved for NASAL specimens only), is one component of a comprehensive MRSA colonization surveillance program. It is not intended to diagnose MRSA infection nor to guide or monitor treatment for MRSA infections.      Labs: BNP (last 3 results) No results for input(s):  BNP in the last 8760 hours. Basic Metabolic Panel:  Recent Labs Lab 08/08/16 0354 08/09/16 0306 08/10/16 0417 08/11/16 0415 08/12/16 0422  NA 137 134* 134* 132* 133*  K 4.7 4.3 4.3 4.4 4.3  CL 100* 99* 97* 96* 96*  CO2 28 27 30 29 28   GLUCOSE 250* 285* 269* 281* 260*  BUN 38* 44* 46* 46* 48*  CREATININE 1.69* 1.64* 1.54* 1.19 1.25*  CALCIUM 9.1 9.0 9.4 9.2 9.6   Liver Function Tests:  Recent Labs Lab 08/08/16 0354 08/09/16 0306 08/10/16 0417 08/11/16 0415 08/12/16 0422  AST 187* 171* 150* 139* 142*  ALT 85* 97* 113* 126* 147*  ALKPHOS 126 125 176* 200* 305*  BILITOT 2.1* 2.2* 2.2* 2.3* 2.4*  PROT 5.9* 6.2* 6.8 6.4* 6.7  ALBUMIN 3.0* 3.0* 3.1* 2.6* 2.8*   No results for input(s): LIPASE, AMYLASE in the last 168 hours. No results for input(s): AMMONIA in the last 168 hours. CBC:  Recent Labs Lab 08/08/16 0354 08/09/16 0306 08/10/16 0417 08/11/16 0415 08/12/16 0422  WBC 7.2 6.5 7.8 8.1 9.9  HGB 9.6* 9.9* 10.5* 11.0* 12.4*  HCT 25.7* 27.2* 30.2* 30.4* 34.1*  MCV 97.0 97.5 98.7 97.7 98.6  PLT 53* 58* 56* 52* 52*   Cardiac Enzymes: No results for input(s): CKTOTAL, CKMB, CKMBINDEX, TROPONINI in the last 168 hours. BNP: Invalid input(s): POCBNP CBG: No results for input(s): GLUCAP in the last 168 hours. D-Dimer No results for input(s): DDIMER in the last 72 hours. Hgb A1c No results for input(s): HGBA1C in the last 72 hours. Lipid Profile No results  for input(s): CHOL, HDL, LDLCALC, TRIG, CHOLHDL, LDLDIRECT in the last 72 hours. Thyroid function studies No results for input(s): TSH, T4TOTAL, T3FREE, THYROIDAB in the last 72 hours.  Invalid input(s): FREET3 Anemia work up No results for input(s): VITAMINB12, FOLATE, FERRITIN, TIBC, IRON, RETICCTPCT in the last 72 hours. Urinalysis    Component Value Date/Time   COLORURINE AMBER (A) 07/31/2016 1835   APPEARANCEUR CLEAR 07/31/2016 1835   LABSPEC >1.046 (H) 07/31/2016 1835   PHURINE 6.0 07/31/2016 1835    GLUCOSEU NEGATIVE 07/31/2016 1835   HGBUR NEGATIVE 07/31/2016 1835   BILIRUBINUR NEGATIVE 07/31/2016 1835   KETONESUR NEGATIVE 07/31/2016 1835   PROTEINUR NEGATIVE 07/31/2016 1835   UROBILINOGEN 1.0 04/26/2015 0934   NITRITE NEGATIVE 07/31/2016 1835   LEUKOCYTESUR NEGATIVE 07/31/2016 1835   Sepsis Labs Invalid input(s): PROCALCITONIN,  WBC,  LACTICIDVEN Microbiology Recent Results (from the past 240 hour(s))  MRSA PCR Screening     Status: None   Collection Time: 08/06/16  5:00 PM  Result Value Ref Range Status   MRSA by PCR NEGATIVE NEGATIVE Final    Comment:        The GeneXpert MRSA Assay (FDA approved for NASAL specimens only), is one component of a comprehensive MRSA colonization surveillance program. It is not intended to diagnose MRSA infection nor to guide or monitor treatment for MRSA infections.     Time coordinating discharge: Over 30 minutes  Vance Gather, MD  [facility] 08/13/2016, 10:42 AM Pager [phone]

## 2016-08-13 NOTE — Clinical Social Work Placement (Addendum)
   CLINICAL SOCIAL WORK PLACEMENT  NOTE  Date:  08/13/2016  Patient Details  Name: Patrick [name] MRN: [id number] Date of Birth: 05-15-1951  Clinical Social Work is seeking post-discharge placement for this patient at the [place] level of care (*CSW will initial, date and re-position this form in  chart as items are completed):  Yes   Patient/family provided with [place] Work Department's list of facilities offering this level of care within the geographic area requested by the patient (or if unable, by the patient's family).  Yes   Patient/family informed of their freedom to choose among providers that offer the needed level of care, that participate in Medicare, Medicaid or managed care program needed by the patient, have an available bed and are willing to accept the patient.  Yes   Patient/family informed of [facility]'s ownership interest in [facility] and [facility], as well as of the fact that they are under no obligation to receive care at these facilities.  PASRR submitted to EDS on 08/05/16     PASRR number received on 08/05/16     Existing PASRR number confirmed on       FL2 transmitted to all facilities in geographic area requested by pt/family on 08/11/16     FL2 transmitted to all facilities within larger geographic area on       Patient informed that his/her managed care company has contracts with or will negotiate with certain facilities, including the following:        Yes   Patient/family informed of bed offers received.  Patient chooses bed at [facility]     Physician recommends and patient chooses bed at      Patient to be transferred to [facility] on 08/13/16.  Patient to be transferred to facility by [facility]     Patient family notified on 08/13/16 of transfer.  Name of family member notified:  BROTHER     PHYSICIAN       Additional Comment: Pt / brother are in agreement with d/c to [facility] today.  [employer] has provided authorization for SNF. [facility] transport is required. Medical necessity form completed. Pt / family are aware out of pocket costs may be associated with [facility] transport. D/C Summary sent to SNF for review. Scripts included in d/c packet. # for report provided to nsg.   _______________________________________________ Luretha Rued, [place] 08/13/2016, 3:08 PM

## 2016-09-04 DEATH — deceased

## 2016-11-25 IMAGING — CT CT HEAD W/O CM
1 series · 16 of 30 positions shown, 20 images · non-contrast
Comparison: November 16, 2010

CLINICAL DATA: Confusion of acute onset.  Esophageal carcinoma

EXAM:
CT HEAD WITHOUT CONTRAST
TECHNIQUE: Contiguous axial images were obtained from the base of the skull
through the vertex without intravenous contrast.

[Series 2: headseq 4.8 h45s · axial · 0.43mm/px · z∈[+1055,+1207]mm · 16 of 36 slices shown, 20 images]
[im 2/36  brain]
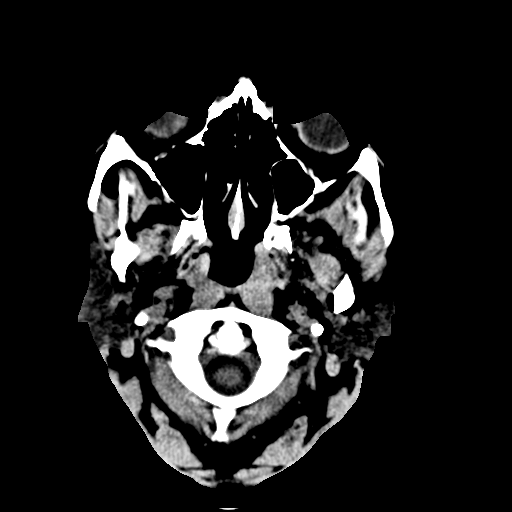
[im 2/36  bone]
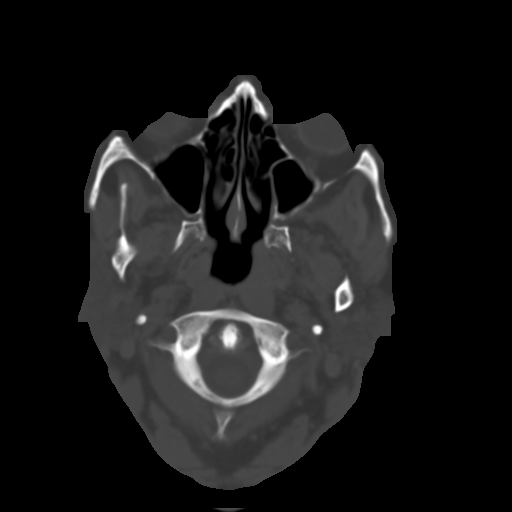
[im 4/36  brain]
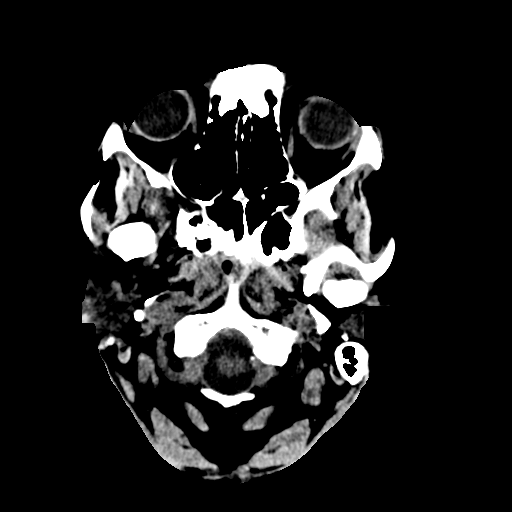
[im 7/36  brain]
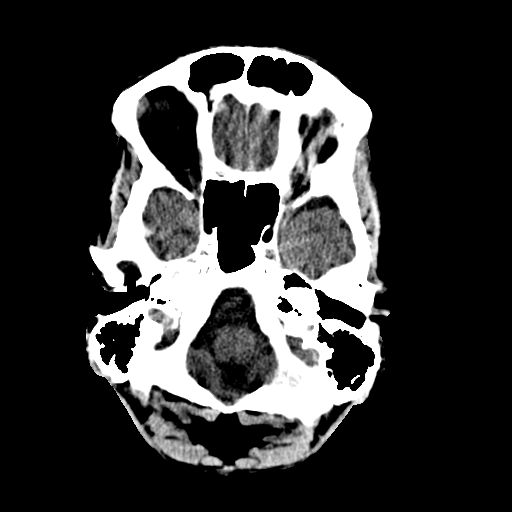
[im 9/36  brain]
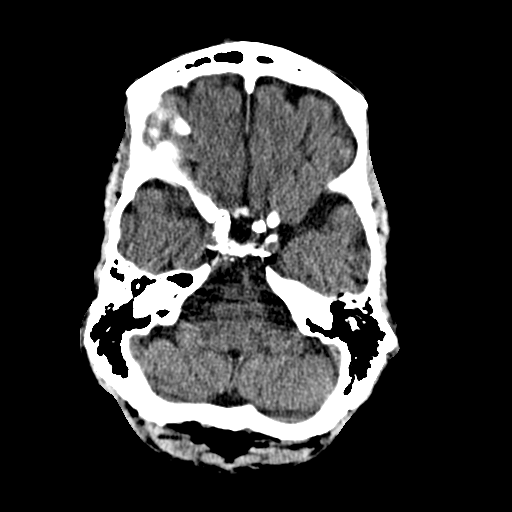
[im 10/36  brain]
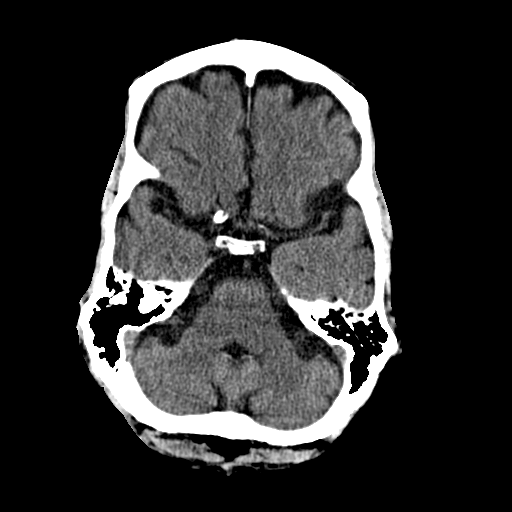
[im 10/36  bone]
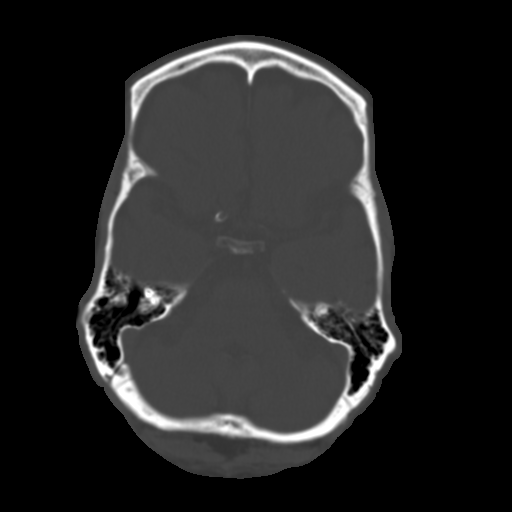
[im 13/36  brain]
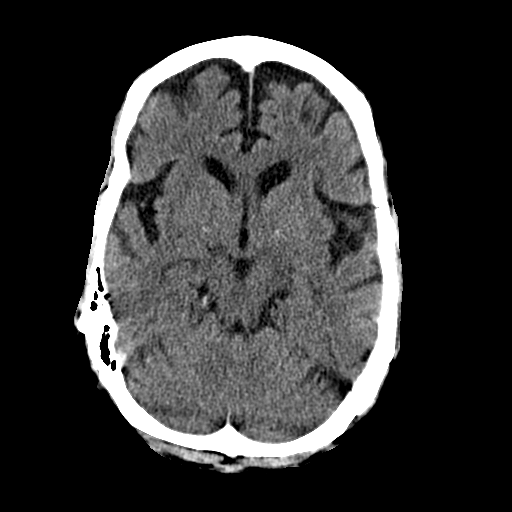
[im 15/36  brain]
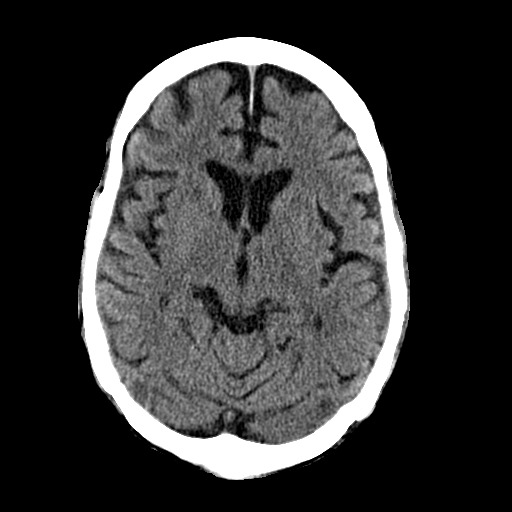
[im 17/36  brain]
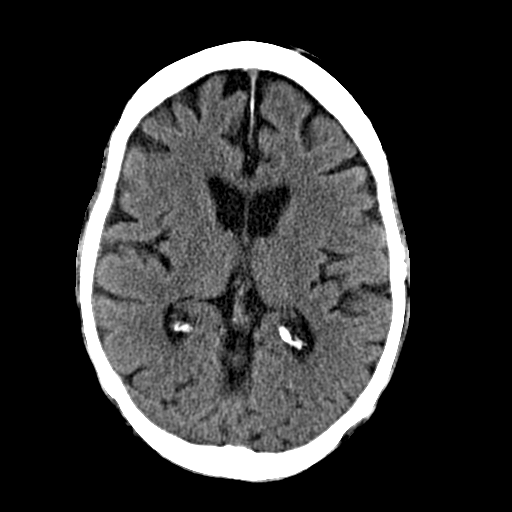
[im 19/36  brain]
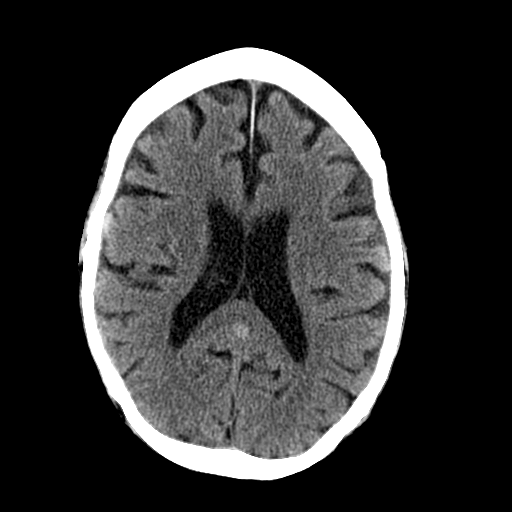
[im 19/36  bone]
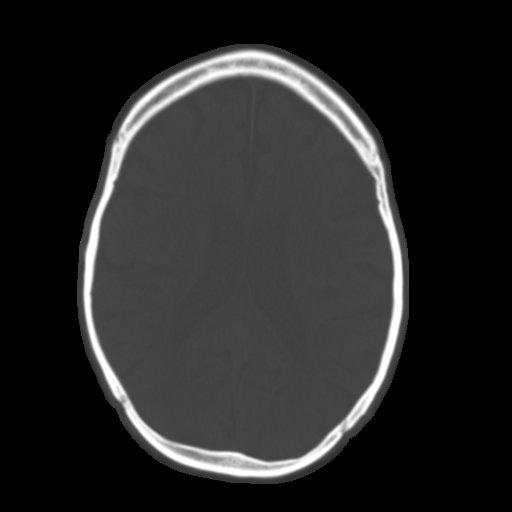
[im 21/36  brain]
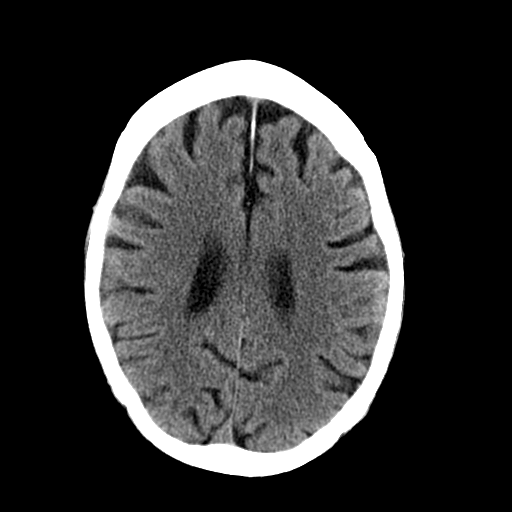
[im 23/36  brain]
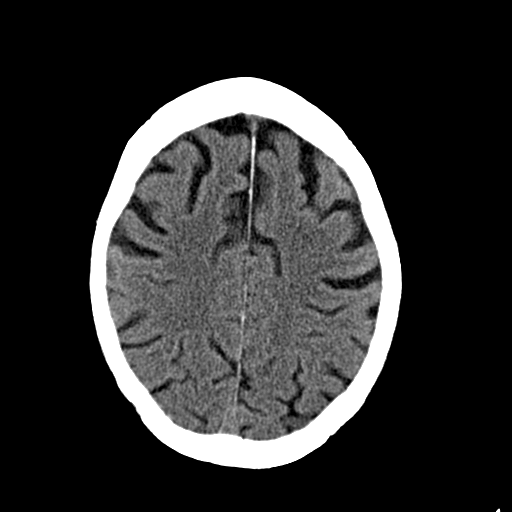
[im 26/36  brain]
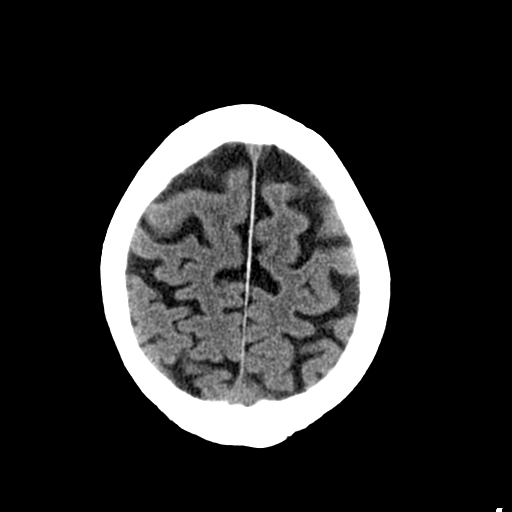
[im 27/36  brain]
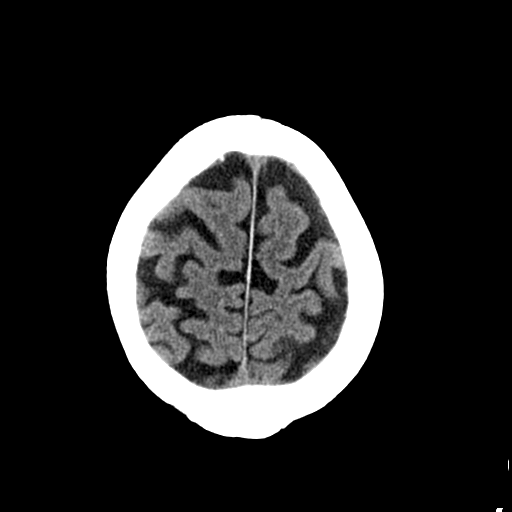
[im 27/36  bone]
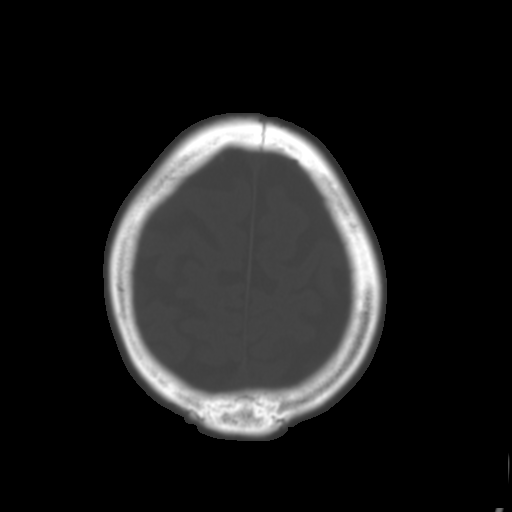
[im 29/36  brain]
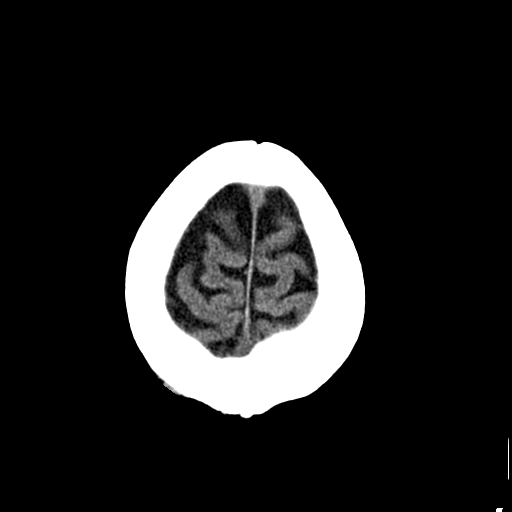
[im 32/36  brain]
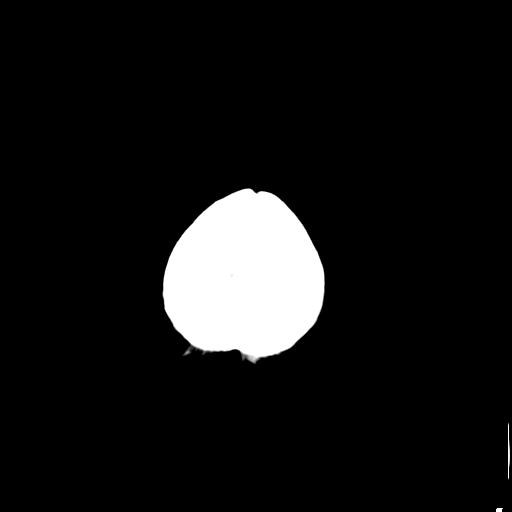
[im 34/36  brain]
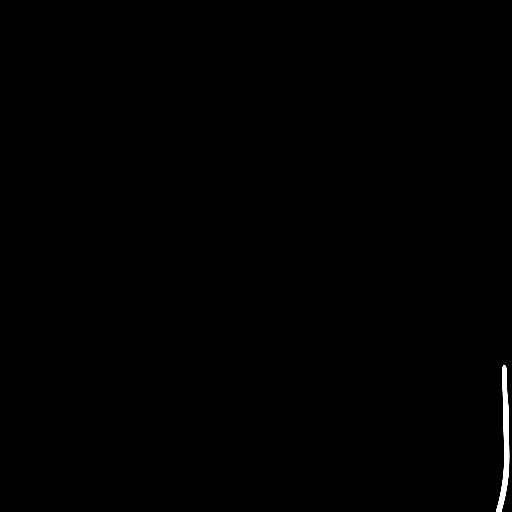

[16 of 30 positions shown; findings below may reference images not displayed]

FINDINGS: There is mild diffuse atrophy. There is no intracranial mass,
hemorrhage, extra-axial fluid collection, or midline shift. There is
rather minimal small vessel disease in the centra semiovale
bilaterally. Elsewhere gray-white compartments are normal. No acute
infarct evident. A small amount of basal ganglia calcification may
be physiologic in this age group. The bony calvarium appears intact.
The mastoid air cells are clear.
IMPRESSION: Mild atrophy with minimal periventricular small vessel disease. No
acute infarct evident. No hemorrhage or mass effect.

## 2016-12-15 ENCOUNTER — Encounter: Payer: Self-pay | Admitting: Internal Medicine

## 2017-01-03 NOTE — Addendum Note (Signed)
Addendum  created 01/03/17 1006 by Duane Boston, MD   Sign clinical note

## 2017-01-03 NOTE — Addendum Note (Signed)
Addendum  created 01/03/17 0800 by Duane Boston, MD   Sign clinical note

## 2017-12-31 IMAGING — US US PARACENTESIS
1 series · 6 of 6 positions shown · non-contrast
Comparison: none

INDICATION: Patient with a history of hepatitis-C and alcoholic cirrhosis.
Ascites present. Request is made for diagnostic and therapeutic
paracentesis.

[Series 1: us paracentesis · 0.26mm/px · 6 of 6 slices shown]
[im 1/6]
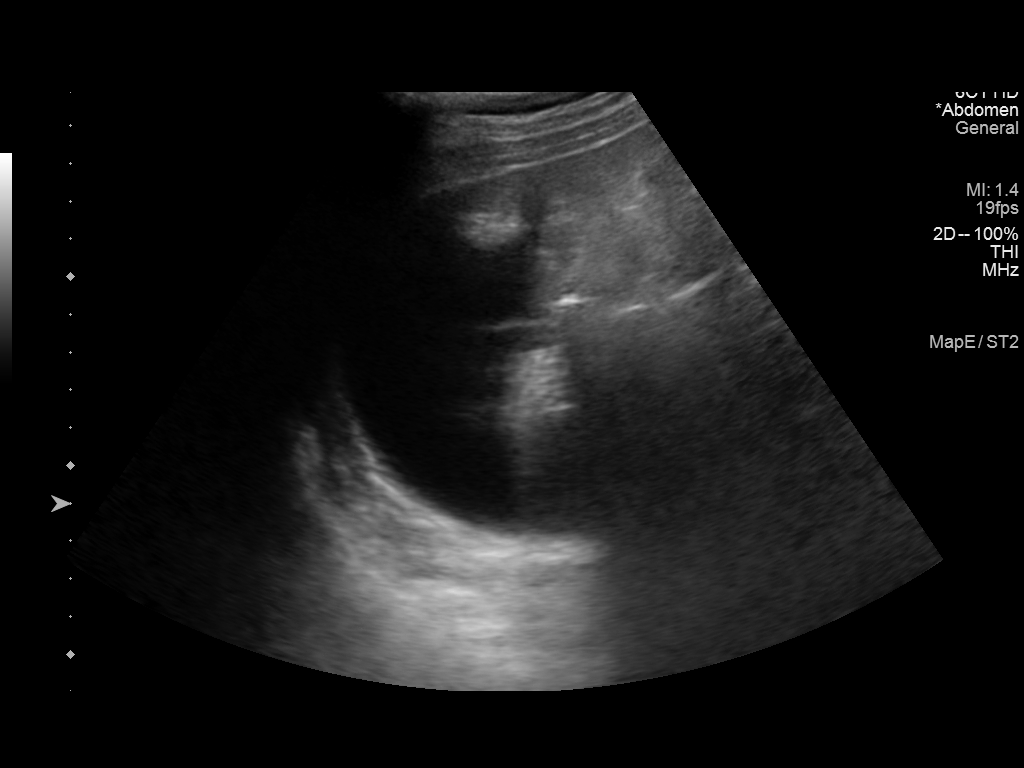
[im 2/6]
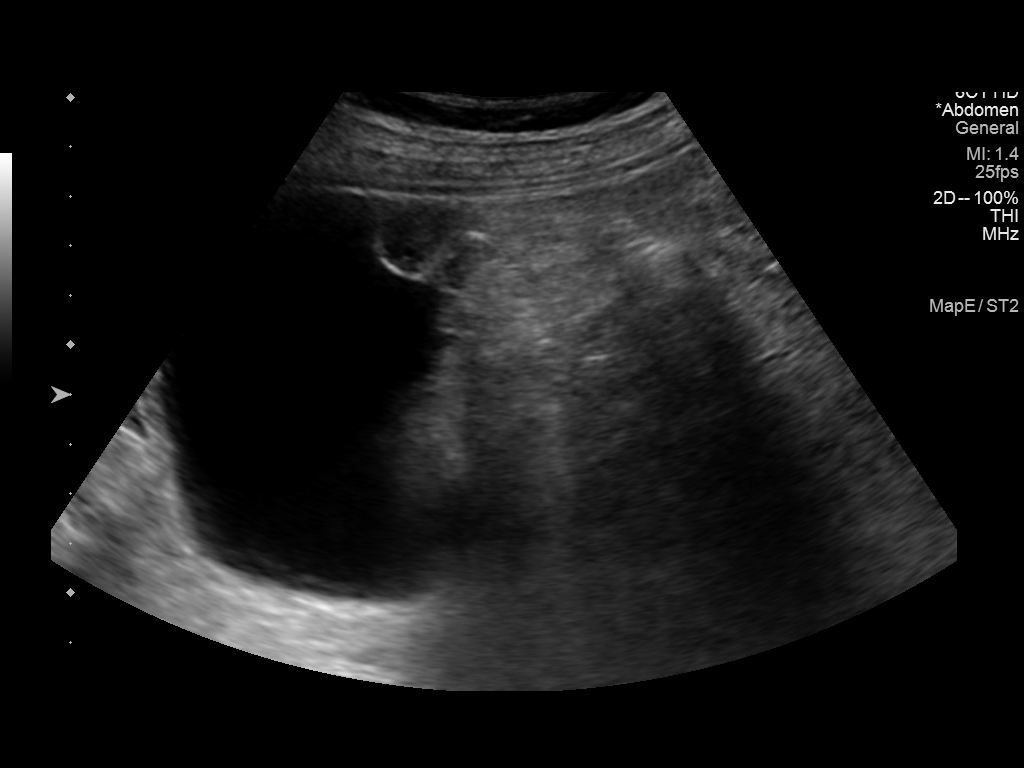
[im 3/6]
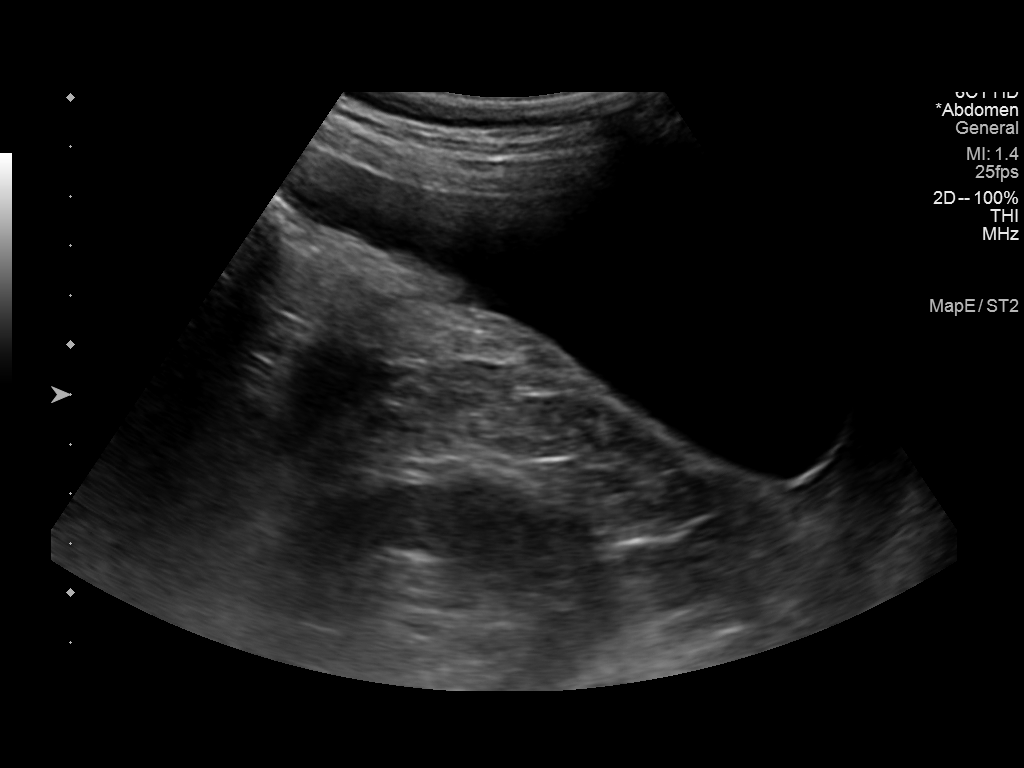
[im 4/6]
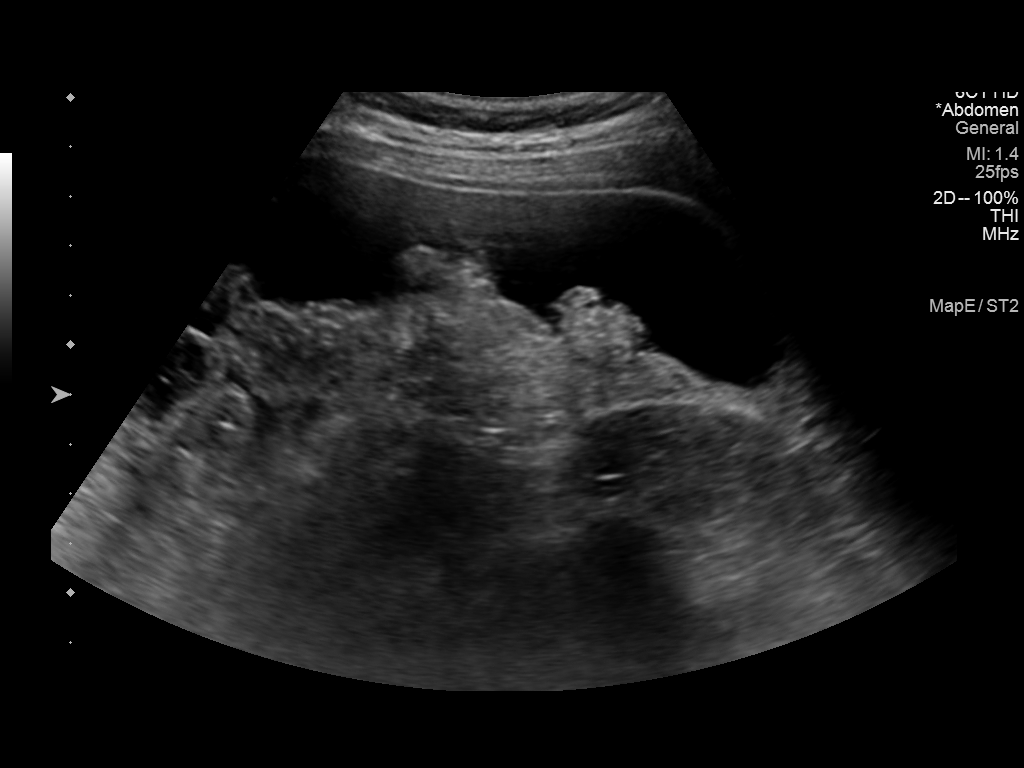
[im 5/6]
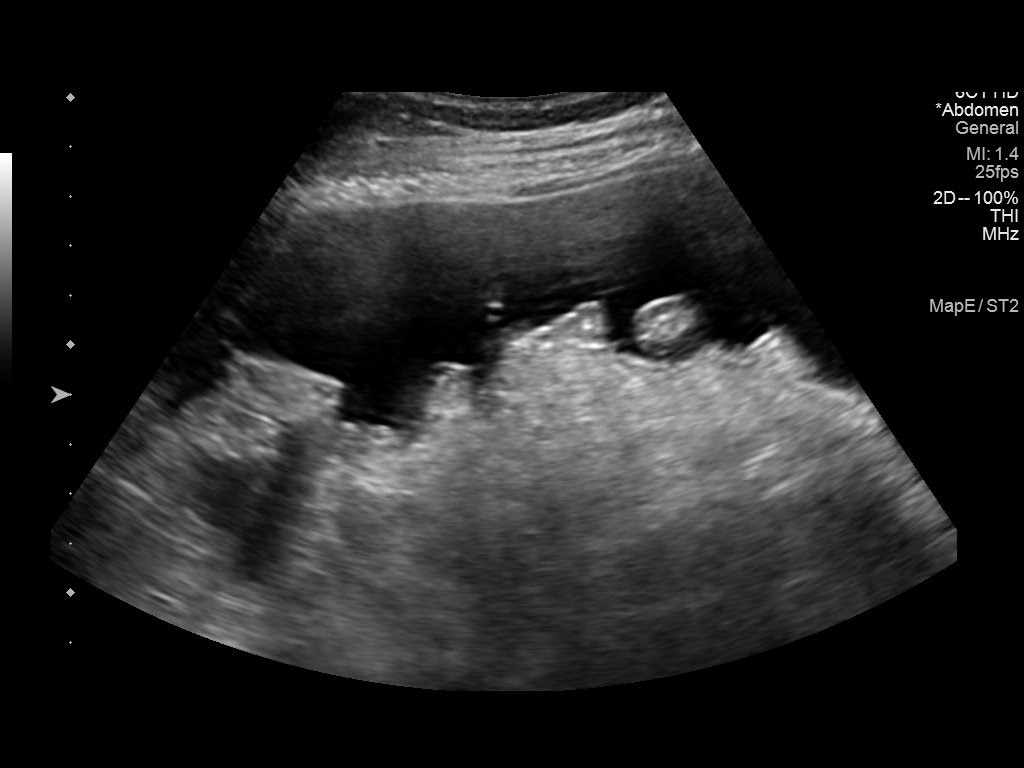
[im 6/6]
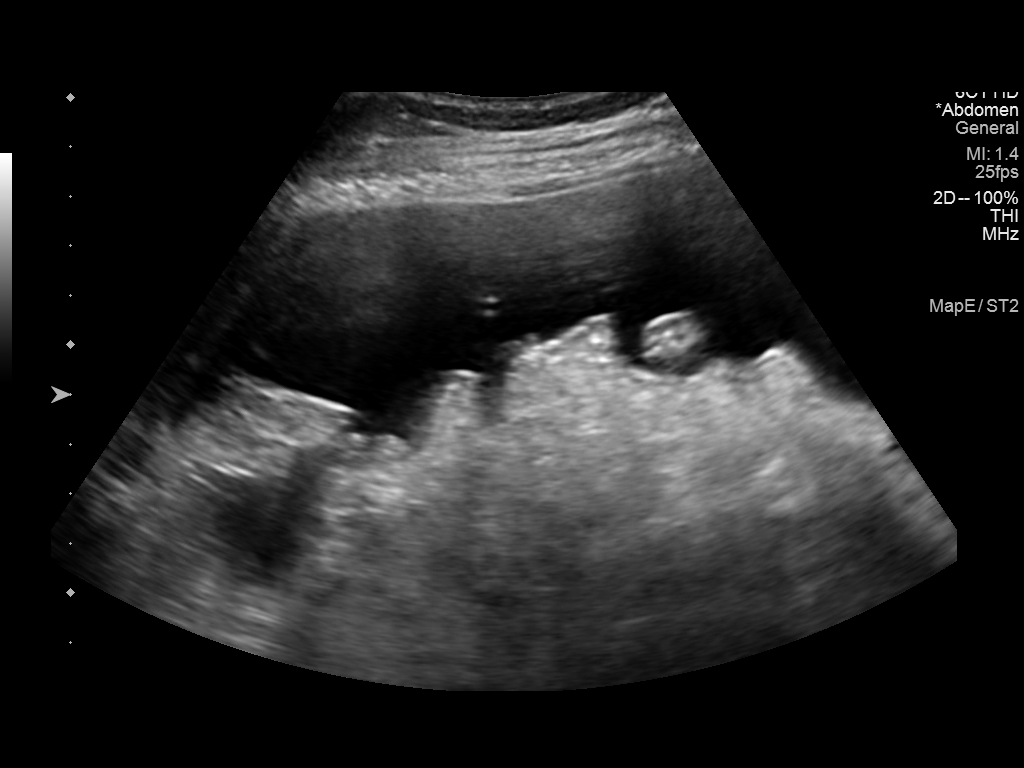

[6 of 6 positions shown; findings below may reference images not displayed]

EXAM:
ULTRASOUND GUIDED DIAGNOSTIC AND THERAPEUTIC PARACENTESIS

MEDICATIONS:
1% lidocaine.

COMPLICATIONS:
None immediate.

PROCEDURE:
Informed written consent was obtained from the patient after a
discussion of the risks, benefits and alternatives to treatment. A
timeout was performed prior to the initiation of the procedure.

Initial ultrasound scanning demonstrates a moderate amount of
ascites within the left lower abdominal quadrant. The left lower
abdomen was prepped and draped in the usual sterile fashion. 1%
lidocaine was used for local anesthesia.

Following this, a 19 gauge, 7-cm, Yueh catheter was introduced. An
ultrasound image was saved for documentation purposes. The
paracentesis was performed. The catheter was removed and a dressing
was applied. The patient tolerated the procedure well without
immediate post procedural complication.
FINDINGS: A total of approximately 3 L of clear yellow fluid was removed.
Samples were sent to the laboratory as requested by the clinical
team.
IMPRESSION: Successful ultrasound-guided paracentesis yielding 3 liters of
peritoneal fluid.

## 2018-01-05 IMAGING — DX DG CHEST 1V PORT
2 series · 2 of 2 positions shown · non-contrast
Comparison: Prior radiograph from 06/26/2015.

CLINICAL DATA: Initial evaluation for acute hypoxemia.

EXAM:
PORTABLE CHEST 1 VIEW

[chest ap (1 of 2)]
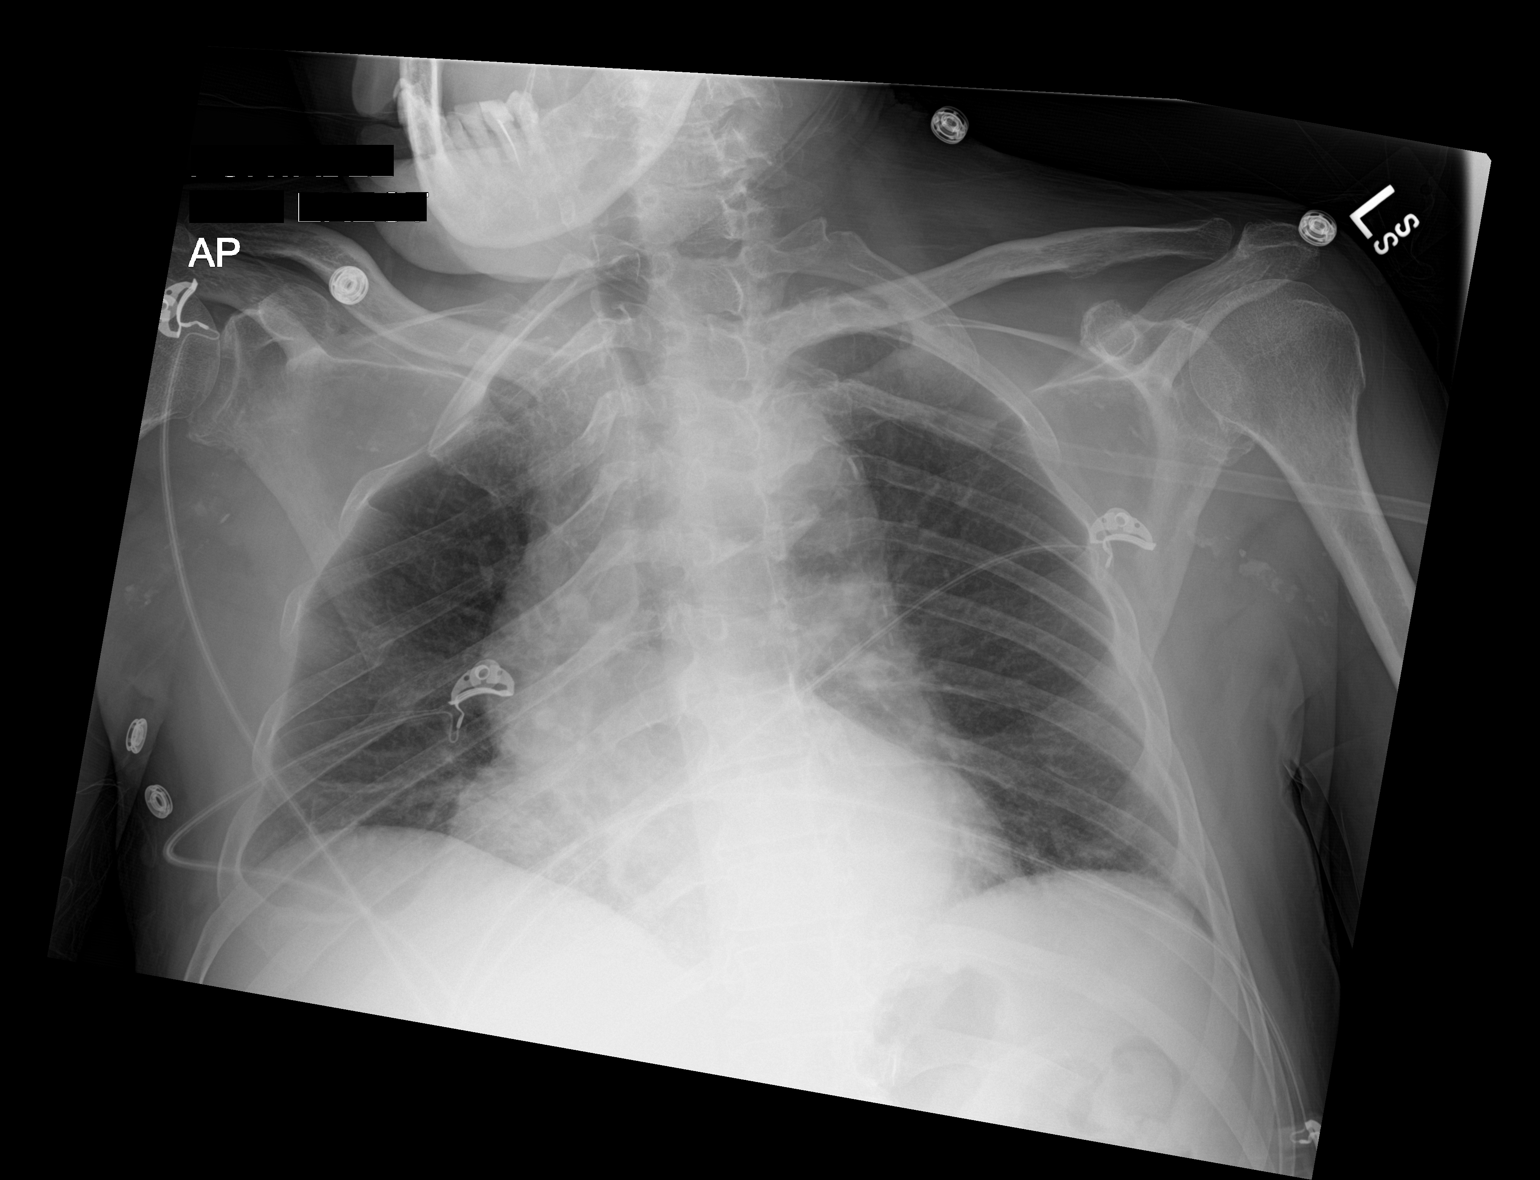

[chest ap (2 of 2)]
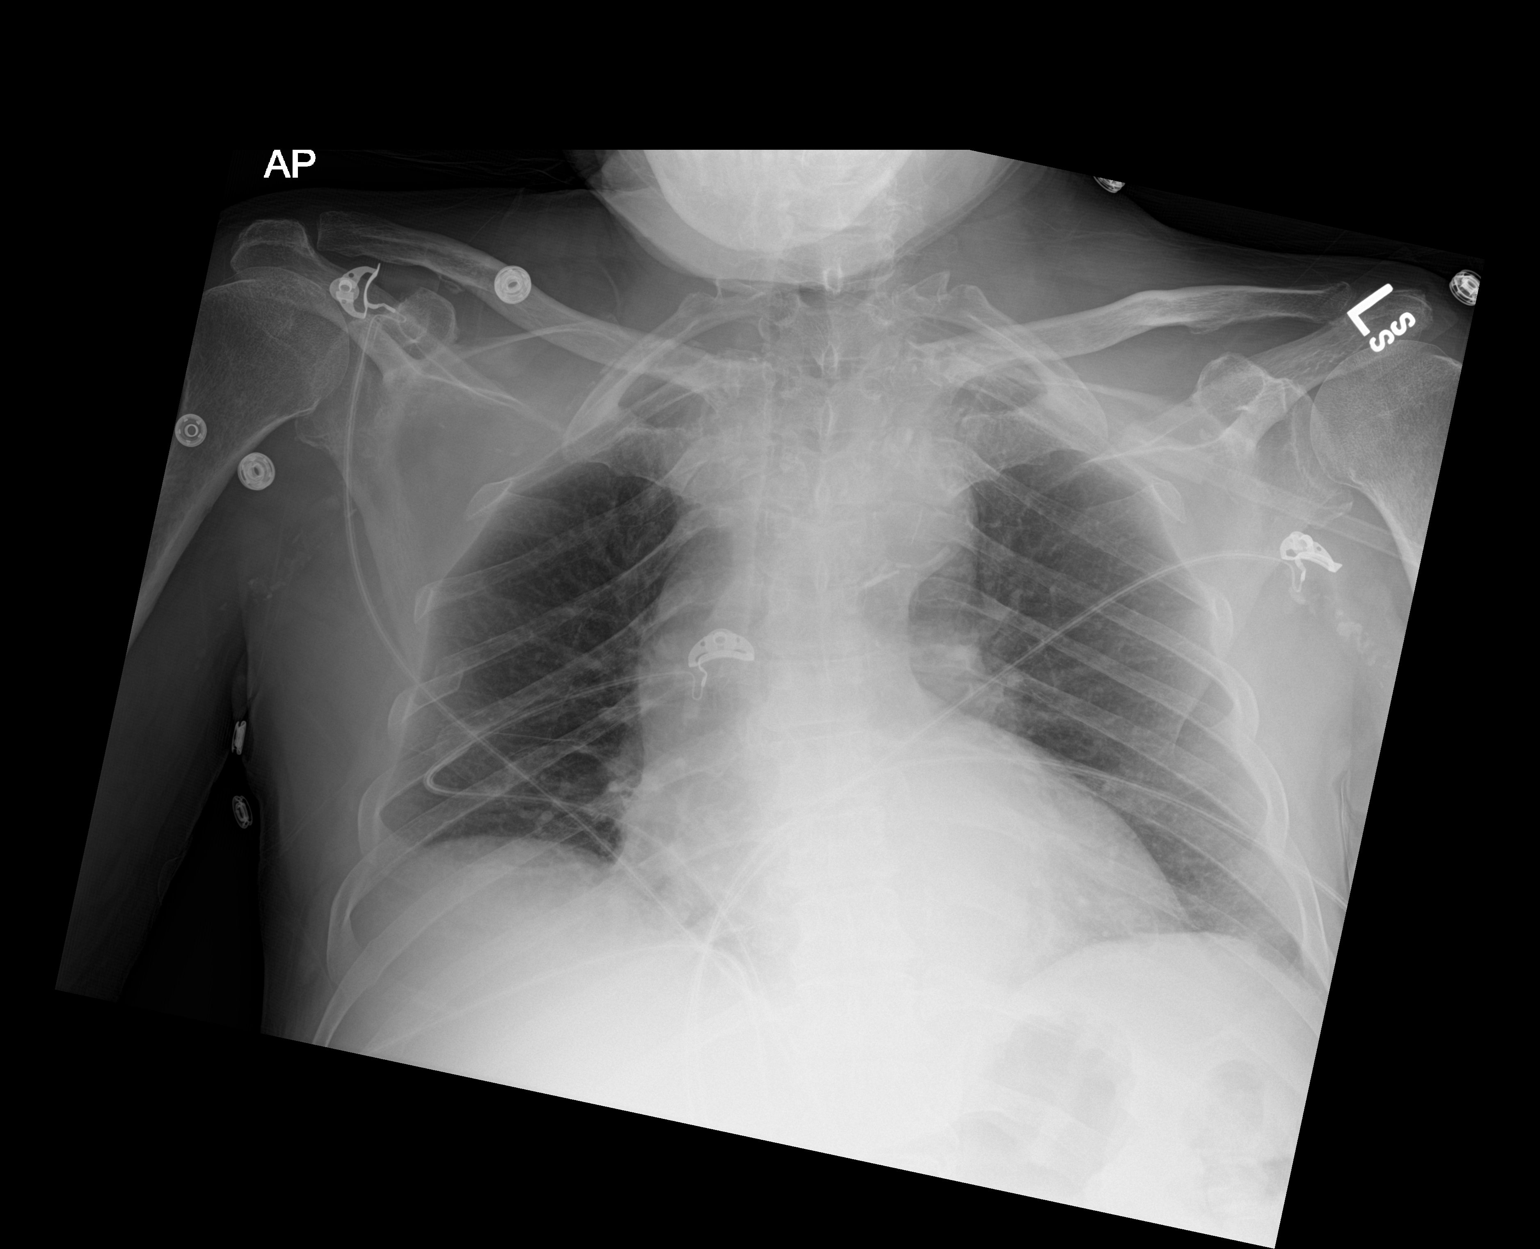

[2 of 2 positions shown; findings below may reference images not displayed]

FINDINGS: Examination is somewhat technically limited by patient positioning
as the patient is markedly rotated to the right.

Cardiomegaly is stable. Allowing for patient rotation, mediastinal
silhouette within normal limits. Aortic atherosclerosis noted.

Lungs are hypoinflated. Associated mild bibasilar atelectasis/
bronchovascular crowding. No focal infiltrates. No pulmonary edema
or pleural effusion. No pneumothorax.

No acute osseous abnormality. Remotely healed right-sided rib
fractures noted. Prominent vascular calcifications noted within the
axillae bilaterally.
IMPRESSION: 1. Shallow lung inflation with mild bibasilar opacities.
Atelectasis/bronchovascular crowding is favored.
2. No other active cardiopulmonary disease identified.
3. Stable mild cardiomegaly without pulmonary edema.
4. Prominent atherosclerosis within the aorta and
subclavian/axillary arteries.

## 2018-07-08 ENCOUNTER — Encounter: Payer: Self-pay | Admitting: Internal Medicine
